# Patient Record
Sex: Female | Born: 1945 | Race: White | Hispanic: No | Marital: Married | Smoking: Former smoker
Health system: Southern US, Community
[De-identification: ages and names within clinical notes are randomized; demographics above are authoritative.]

## PROBLEM LIST (undated history)

## (undated) DIAGNOSIS — F329 Major depressive disorder, single episode, unspecified: Secondary | ICD-10-CM

## (undated) DIAGNOSIS — K219 Gastro-esophageal reflux disease without esophagitis: Secondary | ICD-10-CM

## (undated) DIAGNOSIS — F32A Depression, unspecified: Secondary | ICD-10-CM

## (undated) DIAGNOSIS — G43909 Migraine, unspecified, not intractable, without status migrainosus: Secondary | ICD-10-CM

## (undated) DIAGNOSIS — M359 Systemic involvement of connective tissue, unspecified: Secondary | ICD-10-CM

## (undated) DIAGNOSIS — K529 Noninfective gastroenteritis and colitis, unspecified: Secondary | ICD-10-CM

## (undated) DIAGNOSIS — M1990 Unspecified osteoarthritis, unspecified site: Secondary | ICD-10-CM

## (undated) DIAGNOSIS — F419 Anxiety disorder, unspecified: Secondary | ICD-10-CM

## (undated) DIAGNOSIS — IMO0002 Reserved for concepts with insufficient information to code with codable children: Secondary | ICD-10-CM

## (undated) DIAGNOSIS — G709 Myoneural disorder, unspecified: Secondary | ICD-10-CM

## (undated) DIAGNOSIS — T4145XA Adverse effect of unspecified anesthetic, initial encounter: Secondary | ICD-10-CM

## (undated) DIAGNOSIS — R011 Cardiac murmur, unspecified: Secondary | ICD-10-CM

## (undated) DIAGNOSIS — E7800 Pure hypercholesterolemia, unspecified: Secondary | ICD-10-CM

## (undated) DIAGNOSIS — C4491 Basal cell carcinoma of skin, unspecified: Secondary | ICD-10-CM

## (undated) DIAGNOSIS — K589 Irritable bowel syndrome without diarrhea: Secondary | ICD-10-CM

## (undated) DIAGNOSIS — Z9289 Personal history of other medical treatment: Secondary | ICD-10-CM

## (undated) DIAGNOSIS — I499 Cardiac arrhythmia, unspecified: Secondary | ICD-10-CM

## (undated) DIAGNOSIS — I10 Essential (primary) hypertension: Secondary | ICD-10-CM

## (undated) HISTORY — PX: BREAST CYST ASPIRATION: SHX578

## (undated) HISTORY — PX: SQUAMOUS CELL CARCINOMA EXCISION: SHX2433

## (undated) HISTORY — PX: DILATION AND CURETTAGE OF UTERUS: SHX78

## (undated) HISTORY — PX: NASAL SINUS SURGERY: SHX719

## (undated) HISTORY — PX: VARICOSE VEIN SURGERY: SHX832

## (undated) HISTORY — PX: TOTAL KNEE ARTHROPLASTY: SHX125

## (undated) HISTORY — PX: KNEE ARTHROSCOPY: SUR90

## (undated) HISTORY — PX: COLONOSCOPY: SHX174

## (undated) HISTORY — DX: Reserved for concepts with insufficient information to code with codable children: IMO0002

## (undated) HISTORY — PX: JOINT REPLACEMENT: SHX530

## (undated) HISTORY — PX: BREAST BIOPSY: SHX20

## (undated) HISTORY — PX: CATARACT EXTRACTION W/ INTRAOCULAR LENS  IMPLANT, BILATERAL: SHX1307

---

## 1968-11-21 DIAGNOSIS — Z9289 Personal history of other medical treatment: Secondary | ICD-10-CM

## 1968-11-21 HISTORY — DX: Personal history of other medical treatment: Z92.89

## 1987-11-22 DIAGNOSIS — T8859XA Other complications of anesthesia, initial encounter: Secondary | ICD-10-CM

## 1987-11-22 HISTORY — DX: Other complications of anesthesia, initial encounter: T88.59XA

## 1987-11-22 HISTORY — PX: CHOLECYSTECTOMY OPEN: SUR202

## 2011-03-25 ENCOUNTER — Encounter: Payer: Self-pay | Admitting: Family Medicine

## 2011-03-25 ENCOUNTER — Ambulatory Visit
Admission: RE | Admit: 2011-03-25 | Discharge: 2011-03-25 | Disposition: A | Discharge status: Home / Self Care | Source: Ambulatory Visit | Attending: Family Medicine | Admitting: Family Medicine

## 2011-03-25 ENCOUNTER — Ambulatory Visit (INDEPENDENT_AMBULATORY_CARE_PROVIDER_SITE_OTHER): Admitting: Family Medicine

## 2011-03-25 VITALS — BP 139/72 | HR 68 | Ht 65.5 in | Wt 224.0 lb

## 2011-03-25 DIAGNOSIS — N949 Unspecified condition associated with female genital organs and menstrual cycle: Secondary | ICD-10-CM

## 2011-03-25 DIAGNOSIS — N9489 Other specified conditions associated with female genital organs and menstrual cycle: Secondary | ICD-10-CM

## 2011-03-25 DIAGNOSIS — R32 Unspecified urinary incontinence: Secondary | ICD-10-CM

## 2011-03-25 DIAGNOSIS — M25559 Pain in unspecified hip: Secondary | ICD-10-CM

## 2011-03-25 DIAGNOSIS — M25551 Pain in right hip: Secondary | ICD-10-CM

## 2011-03-25 DIAGNOSIS — L259 Unspecified contact dermatitis, unspecified cause: Secondary | ICD-10-CM

## 2011-03-25 LAB — WET PREP FOR TRICH, YEAST, CLUE: Trich, Wet Prep: NONE SEEN

## 2011-03-25 LAB — POCT URINALYSIS DIPSTICK
Blood, UA: NEGATIVE
Glucose, UA: NEGATIVE

## 2011-03-25 MED ORDER — TRIAMCINOLONE ACETONIDE 0.5 % EX CREA: TOPICAL_CREAM | Freq: Three times a day (TID) | CUTANEOUS | Status: DC

## 2011-03-25 MED ORDER — ESTRADIOL 0.1 MG/GM VA CREA: TOPICAL_CREAM | VAGINAL | Status: DC

## 2011-03-25 NOTE — Assessment & Plan Note (Signed)
Rash certainly appears to be contact dermatitis, not sure what her reaction is to.  Will treat with triamcinolone cream tid x 10 days and avoid scratching.  If not improving, will have derm see her.  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

## 2011-03-25 NOTE — Progress Notes (Signed)
Subjective:    Patient ID: Katherine Nixon, female    DOB: October 23, 1946, 65 y.o.   MRN: [id number]  HPI65 yo WF presents for NOV.  She moved here from [place], [place] last August.  She has had R hip pain with some radiation down her R leg for months and it has kept her from exercising.  She has had an itchy rash on her forearms for 5 wks and one on her chest for 3 mos.  She was diagnosed with sarcoma of the L forearm about 2 yrs ago.    Her last physical was 08-2009 with labs and mammogram.  She had a normal colonoscopy 10-2006.    She c/o vaginal discharge with blood tinge, urinary leakage and painful intercourse.  She is 12 yrs postmenopausal, never on HRT.  She has had 2 negative breast biopsies and no personal hx of breast or ovarian cancer.  Denies dysuria.    BP 139/72  Pulse 68  Ht 5' 5.5" (1.664 m)  Wt 224 lb (101.606 kg)  BMI 36.71 kg/m2  SpO2 96%  History reviewed. No pertinent past medical history.  Past Surgical History  Procedure Date  . Nasal sinus surgery   . Laparoscopic cholecystectomy   . Breast surgery     biopsies/ aspirations - neg  . Dilation and curettage of uterus   . Varicose vein surgery     Family History  Problem Relation Age of Onset  . Heart disease Mother     History   Social History  . Marital Status: Married    Spouse Name: Jimmie    Number of Children: 4  . Years of Education: 15   Occupational History  . retired     med assist, Teacher, adult education   Social History Main Topics  . Smoking status: Former Smoker    Quit date: 11/22/1987  . Smokeless tobacco: Not on file  . Alcohol Use: No  . Drug Use: No  . Sexually Active: No   Other Topics Concern  . Not on file   Social History Narrative  . No narrative on file    Not on File  Current outpatient prescriptions:estradiol (ESTRACE) 0.1 MG/GM vaginal cream, 1/2 gram PV 2 days/ wk, Disp: 42.5 g, Rfl: 1;  traZODone (DESYREL) 50 MG tablet, Take 50 mg by mouth at bedtime.  , Disp: ,  Rfl: ;  triamcinolone (KENALOG) 0.5 % cream, Apply topically 3 (three) times daily., Disp: 15 g, Rfl: 1   Review of Systems  Constitutional: Negative for fever, fatigue and unexpected weight change.  HENT: Negative for congestion and sore throat.   Eyes: Negative for visual disturbance.  Respiratory: Negative for cough and shortness of breath.   Gastrointestinal: Negative for nausea, vomiting, abdominal pain and diarrhea.  Genitourinary: Positive for urgency, vaginal discharge, enuresis, vaginal pain and dyspareunia. Negative for dysuria, frequency, hematuria and pelvic pain.  Musculoskeletal: Positive for arthralgias (R hip pain, some LBP, some R leg pain).  Neurological: Negative for numbness and headaches.  Psychiatric/Behavioral: Positive for sleep disturbance. Negative for dysphoric mood. The patient is not nervous/anxious.        Objective:   Physical Exam  Constitutional: She appears well-developed and well-nourished. No distress.       obese  HENT:  Head: Normocephalic and atraumatic.  Eyes: Conjunctivae are normal. No scleral icterus.  Neck: Neck supple. No thyromegaly present.  Cardiovascular: Normal rate, regular rhythm and normal heart sounds.   No murmur heard. Pulmonary/Chest: Effort normal and breath  sounds normal. No respiratory distress. She has no wheezes.  Musculoskeletal: She exhibits no edema.       Gait normal  Lymphadenopathy:    She has no cervical adenopathy.  Skin: Rash (tiny vesiciles on erythematous base both forearms and chest) noted.       Spider veins, both legs  Psychiatric: She has a normal mood and affect.          Assessment & Plan:

## 2011-03-25 NOTE — Patient Instructions (Signed)
Use Triamcinolone cream on rash 3 x a day for 10 days. Call me if not resolved in 10 days.  Dermatologist in [place]:  Will call you with wet prep results on Monday.  Xray R hip today and will call you w/ results on Monday.  Consider Estrogen vaginal cream 1/ 2 gram 2 days/ wk for atrophic vaginitis.  Return for f/u visit with fasting labs in 2 mos.

## 2011-03-25 NOTE — Assessment & Plan Note (Addendum)
UA normal today.  Likely from obesity and atrophic vaginitis, also with blood tinged discharge.  Will r/o vaginitis also.  She agrees to a trial of Estrogen transvaginal cream 1/2 gram 2 x a wk.

## 2011-03-26 ENCOUNTER — Telehealth: Payer: Self-pay | Admitting: Family Medicine

## 2011-03-26 DIAGNOSIS — M1610 Unilateral primary osteoarthritis, unspecified hip: Secondary | ICD-10-CM

## 2011-03-26 MED ORDER — METRONIDAZOLE 0.75 % VA GEL: 1.0000 | Freq: Every day | VAGINAL | Status: AC

## 2011-03-26 NOTE — Telephone Encounter (Signed)
Pls let pt know that she has arthritis (moderate) on Xray.  If she wants to get in with ortho downstairs, I am happy to do this for her.

## 2011-03-26 NOTE — Telephone Encounter (Signed)
Pls let pt know that she did test + for bacterial vaginosis on her wet prep.  This is not an STD.  Will treat with Metronidazole intravaginally daily x 5 days.

## 2011-03-28 NOTE — Telephone Encounter (Signed)
Pt aware of the below and would like to see ortho.

## 2011-03-28 NOTE — Telephone Encounter (Signed)
Done

## 2011-04-21 ENCOUNTER — Other Ambulatory Visit: Payer: Self-pay | Admitting: Sports Medicine

## 2011-04-21 ENCOUNTER — Ambulatory Visit
Admission: RE | Admit: 2011-04-21 | Discharge: 2011-04-21 | Disposition: A | Discharge status: Home / Self Care | Source: Ambulatory Visit | Attending: Sports Medicine | Admitting: Sports Medicine

## 2011-04-21 DIAGNOSIS — M545 Low back pain: Secondary | ICD-10-CM

## 2011-04-28 ENCOUNTER — Ambulatory Visit (INDEPENDENT_AMBULATORY_CARE_PROVIDER_SITE_OTHER): Admitting: Family Medicine

## 2011-04-28 ENCOUNTER — Encounter: Payer: Self-pay | Admitting: Family Medicine

## 2011-04-28 VITALS — BP 170/85 | HR 96 | Ht 66.0 in | Wt 227.0 lb

## 2011-04-28 DIAGNOSIS — H43399 Other vitreous opacities, unspecified eye: Secondary | ICD-10-CM

## 2011-04-28 NOTE — Progress Notes (Signed)
  Subjective:    Patient ID: Katherine Nixon, female    DOB: [birth year], 65 y.o.   MRN: [id number]  HPI Floater in the left eye started last night.  Started with one but now having several others.  Never had this before. This started getting light flashes in the left side of her left eye. She wears glasses. She was on nabumetone for her leg and hip but stopped it Tuesday.  No eye pain. Low grade HA on the left. Notes her BP is higher today than usual.     Review of Systems     Objective:   Physical Exam  Constitutional: She appears well-developed and well-nourished.  HENT:  Head: Normocephalic and atraumatic.  Eyes: Conjunctivae and EOM are normal. Pupils are equal, round, and reactive to light. Right eye exhibits no discharge. Left eye exhibits no discharge.       Attempted fundoscopy exam but vessels not well visualized.           Assessment & Plan:  Eye floaters and flashes - Worriesome for retinal detachment. Prompt referral to Ophthalmology. They can fit her in tomorrow at 12:45. Pt given appt card and time and location before she left today. If sudden vision loss overnight then needs to go to the ER.   Elevated BP- may be from nabumetone or from her pain today or the anxiety about her eyes.

## 2011-05-09 ENCOUNTER — Encounter: Payer: Self-pay | Admitting: Family Medicine

## 2011-05-09 DIAGNOSIS — F329 Major depressive disorder, single episode, unspecified: Secondary | ICD-10-CM | POA: Insufficient documentation

## 2011-05-09 DIAGNOSIS — K219 Gastro-esophageal reflux disease without esophagitis: Secondary | ICD-10-CM | POA: Insufficient documentation

## 2011-05-09 DIAGNOSIS — F3289 Other specified depressive episodes: Secondary | ICD-10-CM | POA: Insufficient documentation

## 2011-05-09 DIAGNOSIS — I10 Essential (primary) hypertension: Secondary | ICD-10-CM | POA: Insufficient documentation

## 2011-05-09 DIAGNOSIS — I8390 Asymptomatic varicose veins of unspecified lower extremity: Secondary | ICD-10-CM | POA: Insufficient documentation

## 2011-05-23 ENCOUNTER — Encounter: Payer: Self-pay | Admitting: Family Medicine

## 2011-06-01 ENCOUNTER — Ambulatory Visit (INDEPENDENT_AMBULATORY_CARE_PROVIDER_SITE_OTHER): Admitting: Family Medicine

## 2011-06-01 ENCOUNTER — Encounter: Payer: Self-pay | Admitting: Family Medicine

## 2011-06-01 DIAGNOSIS — I10 Essential (primary) hypertension: Secondary | ICD-10-CM

## 2011-06-01 DIAGNOSIS — N760 Acute vaginitis: Secondary | ICD-10-CM

## 2011-06-01 DIAGNOSIS — L259 Unspecified contact dermatitis, unspecified cause: Secondary | ICD-10-CM

## 2011-06-01 DIAGNOSIS — Z13220 Encounter for screening for lipoid disorders: Secondary | ICD-10-CM

## 2011-06-01 DIAGNOSIS — N952 Postmenopausal atrophic vaginitis: Secondary | ICD-10-CM

## 2011-06-01 LAB — WET PREP FOR TRICH, YEAST, CLUE: Clue Cells Wet Prep HPF POC: NONE SEEN

## 2011-06-01 MED ORDER — AMLODIPINE BESYLATE 5 MG PO TABS: 5.0000 mg | ORAL_TABLET | Freq: Every day | ORAL | Status: DC

## 2011-06-01 MED ORDER — TRAZODONE HCL 50 MG PO TABS: 50.0000 mg | ORAL_TABLET | Freq: Every day | ORAL | Status: DC

## 2011-06-01 MED ORDER — ESTRADIOL 25 MCG VA TABS: 25.0000 ug | ORAL_TABLET | Freq: Every day | VAGINAL | Status: DC

## 2011-06-01 NOTE — Assessment & Plan Note (Signed)
Estrace cream did not help much and she still had dyspareunia.  Will change her to Vagifem 10 mg daily for the first 2 wks of each month (PV).  WP obtained today.

## 2011-06-01 NOTE — Patient Instructions (Signed)
Fasting labs today. Will call you w/ results tomorrow.  Start Norvasc 5 mg/ day for BP.  Use RX Vagifem tab intravaginally for the first 2 mos of each month.  F/u BP in 3 mos.  PAP due after DEC.

## 2011-06-01 NOTE — Progress Notes (Signed)
  Subjective:    Patient ID: Katherine Nixon, female    DOB: June 15, 1946, 65 y.o.   MRN: [id number]  HPI  65 yo WF presents for f/u visit with fasting labs.  She did see her eye doctor for this was diagnosed with a vitreous detachement and has f/u in a wk.  She had to go to [place] to get her mom and bring her down here.  Her mom has pretty bad COPD.   She has never had HTN though her Bp has been high her last 3 visits now.  Denies chest pain, DOe, swelling or use of decongestants.    BP 157/87  Pulse 62  Ht 5\' 6"  (1.676 m)  Wt 223 lb (101.152 kg)  BMI 35.99 kg/m2  SpO2 96%   Review of Systems  Constitutional: Negative for unexpected weight change.  Eyes: Negative for visual disturbance.  Respiratory: Negative for shortness of breath.   Cardiovascular: Negative for chest pain and palpitations.  Genitourinary: Negative for difficulty urinating.  Neurological: Negative for headaches.       Objective:   Physical Exam  Constitutional: She appears well-developed and well-nourished. No distress.       obese  HENT:  Mouth/Throat: Oropharynx is clear and moist.  Eyes: Conjunctivae are normal. No scleral icterus.  Neck: No thyromegaly present.  Cardiovascular: Normal rate, regular rhythm and normal heart sounds.   No murmur heard. Pulmonary/Chest: Effort normal and breath sounds normal.  Musculoskeletal: She exhibits no edema.  Lymphadenopathy:    She has no cervical adenopathy.  Skin: Skin is warm and dry.  Psychiatric: She has a normal mood and affect.          Assessment & Plan:

## 2011-06-01 NOTE — Assessment & Plan Note (Signed)
Will update her labs today and start her on Norvasc 5 mg/ day.  Call if any problems.  F/u in 3 mos.  Work on diet, exercise, wt loss.

## 2011-06-02 ENCOUNTER — Telehealth: Payer: Self-pay | Admitting: Family Medicine

## 2011-06-02 DIAGNOSIS — E785 Hyperlipidemia, unspecified: Secondary | ICD-10-CM

## 2011-06-02 LAB — COMPLETE METABOLIC PANEL WITH GFR
AST: 21 U/L (ref 0–37)
Albumin: 4.7 g/dL (ref 3.5–5.2)
Alkaline Phosphatase: 81 U/L (ref 39–117)
BUN: 13 mg/dL (ref 6–23)
GFR, Est Non African American: >60 mL/min (ref >60)
Potassium: 4.5 mEq/L (ref 3.5–5.3)
Sodium: 142 mEq/L (ref 135–145)
Total Bilirubin: 0.6 mg/dL (ref 0.3–1.2)

## 2011-06-02 LAB — CBC WITH DIFFERENTIAL/PLATELET
Basophils Absolute: 0 10*3/uL (ref 0.0–0.1)
Basophils Relative: 1 % (ref 0–1)
Eosinophils Absolute: 0.1 10*3/uL (ref 0.0–0.7)
MCH: 31.6 pg (ref 26.0–34.0)
MCHC: 32.4 g/dL (ref 30.0–36.0)
Monocytes Absolute: 0.5 10*3/uL (ref 0.1–1.0)
Neutro Abs: 3.3 10*3/uL (ref 1.7–7.7)
Neutrophils Relative %: 59 % (ref 43–77)
RDW: 13.7 % (ref 11.5–15.5)

## 2011-06-02 MED ORDER — PRAVASTATIN SODIUM 40 MG PO TABS: 40.0000 mg | ORAL_TABLET | Freq: Every day | ORAL | Status: DC

## 2011-06-02 MED ORDER — ESTRADIOL 10 MCG VA TABS: ORAL_TABLET | VAGINAL | Status: DC

## 2011-06-02 NOTE — Telephone Encounter (Signed)
Pls let pt know that her wet prep came back negative for vaginitis.  Blood counts are normal.  Fasting sugar, liver and kidney function are normal.  Cholesterol is HIGH with a total of 265 and an LDL bad cholesterol of 169. She should be <130.  Will add RX medication at bedtime to get her to goal and reduce her risk for heart attack and stroke.    Call if any questions/ problems and repeat LDL with liver enzymes after 8 wks.

## 2011-06-02 NOTE — Telephone Encounter (Signed)
Pt aware of the above  

## 2011-06-16 ENCOUNTER — Other Ambulatory Visit: Payer: Self-pay | Admitting: Sports Medicine

## 2011-06-16 DIAGNOSIS — M545 Low back pain, unspecified: Secondary | ICD-10-CM

## 2011-06-25 ENCOUNTER — Ambulatory Visit
Admission: RE | Admit: 2011-06-25 | Discharge: 2011-06-25 | Disposition: A | Discharge status: Home / Self Care | Source: Ambulatory Visit | Attending: Sports Medicine | Admitting: Sports Medicine

## 2011-06-25 DIAGNOSIS — M545 Low back pain, unspecified: Secondary | ICD-10-CM

## 2011-09-01 ENCOUNTER — Ambulatory Visit: Admitting: Family Medicine

## 2011-09-01 ENCOUNTER — Encounter: Payer: Self-pay | Admitting: Family Medicine

## 2011-09-01 ENCOUNTER — Ambulatory Visit (INDEPENDENT_AMBULATORY_CARE_PROVIDER_SITE_OTHER): Admitting: Family Medicine

## 2011-09-01 VITALS — BP 138/72 | HR 70 | Wt 224.0 lb

## 2011-09-01 DIAGNOSIS — E785 Hyperlipidemia, unspecified: Secondary | ICD-10-CM

## 2011-09-01 DIAGNOSIS — Z23 Encounter for immunization: Secondary | ICD-10-CM

## 2011-09-01 DIAGNOSIS — I10 Essential (primary) hypertension: Secondary | ICD-10-CM

## 2011-09-01 NOTE — Patient Instructions (Signed)
Low salt diet and as much exercise as possible Go for labs anytime in the next month.

## 2011-09-01 NOTE — Progress Notes (Signed)
  Subjective:    Patient ID: Katherine Nixon, female    DOB: 05/10/1946, 65 y.o.   MRN: [id number]  Hypertension This is a chronic problem. The current episode started more than 1 month ago. The problem is unchanged. Pertinent negatives include no chest pain or shortness of breath. There are no associated agents to hypertension. Past treatments include calcium channel blockers. Compliance problems include exercise.   her hip pain has really kept her from exercising. Also admits she is not eaint as well. Has been stress eating. Used to work out regularly until this last year.     Review of Systems  Respiratory: Negative for shortness of breath.   Cardiovascular: Negative for chest pain.       Objective:   Physical Exam  Constitutional: She is oriented to person, place, and time. She appears well-developed and well-nourished.  HENT:  Head: Normocephalic and atraumatic.  Cardiovascular: Normal rate, regular rhythm and normal heart sounds.   Pulmonary/Chest: Effort normal and breath sounds normal.  Musculoskeletal: She exhibits no edema.  Neurological: She is oriented to person, place, and time.  Skin: Skin is warm and dry.  Psychiatric: She has a normal mood and affect. Her behavior is normal.          Assessment & Plan:  HTN - Doing well on amlodipine. No side effects. F.U in 4 months.  I did encourage regular exercise and diet. She feels she already eats a low-salt diet.  Hyperlipidemia - due to recheck on her levels on pravastatin.  She admits she has not been taking a statin for quite some time. She says she doesn't quite believe the numbers that came back on her last cholesterol check in April. Thus I gave her a lab slip to go back this month to have them rechecked off of the medication. If they're still high then she will need to restart a statin. We discussed the benefits of reducing the morbidity and mortality mortality of heart attack and stroke if she treats her cholesterol.  Her last LDL was 169.

## 2011-12-22 ENCOUNTER — Other Ambulatory Visit: Payer: Self-pay | Admitting: Family Medicine

## 2011-12-22 DIAGNOSIS — Z85828 Personal history of other malignant neoplasm of skin: Secondary | ICD-10-CM | POA: Diagnosis not present

## 2012-04-19 DIAGNOSIS — L608 Other nail disorders: Secondary | ICD-10-CM | POA: Diagnosis not present

## 2012-05-03 DIAGNOSIS — R21 Rash and other nonspecific skin eruption: Secondary | ICD-10-CM | POA: Diagnosis not present

## 2012-05-11 ENCOUNTER — Telehealth: Payer: Self-pay | Admitting: Family Medicine

## 2012-05-11 NOTE — Telephone Encounter (Signed)
Call patient: I received some blood work from [facility] dermatology, Dr. Stefanie Libel. 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 At all her medications the only one that can cause this is pravastatin. If that time where she's not actually taking the medication. I don't believe she taken this for over a  Year,  in which case this would not be causing her symptoms. I just want to verify whether not she was still taking the pravastatin.

## 2012-05-11 NOTE — Telephone Encounter (Signed)
LM for pt to call back to ask questions per Dr. Linford Arnold.

## 2012-05-14 NOTE — Telephone Encounter (Signed)
Pt states she hasn't taken the Pravastatin in almost a year and that she only took it for a week. States the only meds she is on are the BP med and trazodone. States she has scheduled an appt with you on 05/22/12.

## 2012-05-22 ENCOUNTER — Ambulatory Visit (INDEPENDENT_AMBULATORY_CARE_PROVIDER_SITE_OTHER): Payer: Medicare Other | Admitting: Family Medicine

## 2012-05-22 ENCOUNTER — Encounter: Payer: Self-pay | Admitting: Family Medicine

## 2012-05-22 VITALS — BP 134/73 | HR 69 | Ht 66.0 in | Wt 222.0 lb

## 2012-05-22 DIAGNOSIS — Z1331 Encounter for screening for depression: Secondary | ICD-10-CM

## 2012-05-22 DIAGNOSIS — E785 Hyperlipidemia, unspecified: Secondary | ICD-10-CM | POA: Diagnosis not present

## 2012-05-22 DIAGNOSIS — R7989 Other specified abnormal findings of blood chemistry: Secondary | ICD-10-CM

## 2012-05-22 DIAGNOSIS — M359 Systemic involvement of connective tissue, unspecified: Secondary | ICD-10-CM

## 2012-05-22 DIAGNOSIS — Z8379 Family history of other diseases of the digestive system: Secondary | ICD-10-CM | POA: Diagnosis not present

## 2012-05-22 DIAGNOSIS — R946 Abnormal results of thyroid function studies: Secondary | ICD-10-CM | POA: Diagnosis not present

## 2012-05-22 DIAGNOSIS — I10 Essential (primary) hypertension: Secondary | ICD-10-CM | POA: Diagnosis not present

## 2012-05-22 DIAGNOSIS — R21 Rash and other nonspecific skin eruption: Secondary | ICD-10-CM

## 2012-05-22 LAB — LIPID PANEL
Cholesterol: 235 mg/dL — ABNORMAL HIGH (ref 0–200)
VLDL: 16 mg/dL (ref 0–40)

## 2012-05-22 MED ORDER — AMLODIPINE BESYLATE 5 MG PO TABS: 5.0000 mg | ORAL_TABLET | Freq: Every day | ORAL | Status: DC

## 2012-05-22 MED ORDER — TRAZODONE HCL 50 MG PO TABS: 50.0000 mg | ORAL_TABLET | Freq: Every day | ORAL | Status: DC

## 2012-05-22 NOTE — Patient Instructions (Addendum)
We will call you with your lab results. If you don't here from us in about a week then please give us a call at [phone].  

## 2012-05-22 NOTE — Progress Notes (Addendum)
Subjective:    Patient ID: Katherine Nixon, female    DOB: 06-Dec-1945, 66 y.o.   MRN: [id number]  HPI Says keeps getting itchy rashes on her upper chest and back with no triggers.  Has inc joint pain. Nails have been coming off. Has seen ortho for her right hip and they rec hip replacement.  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 Says has had similar sxs previsously as well.  Has had hairloss.  Doesn't sleep well so uses trazodone. Gets about 4-5 hours per night on the med.  Gets hot sweats now but never had them when went through menupause.  The will get cold when she is tired.  She felt she had unexplained fatigue. She was also taking care of her ailing mother who has now passed away. Says has been depressed before but feels this is different.     Mother died of cirrhosis, fatty liver.  She would like to be checked. She does have swelling in feet and ankles.  The cirrhosis and fatty liver contribute to her mother's death and she is very concerned like to be evaluated for this. She is obese so it is highly likely she does have fatty liver.  Review of Systems BP 134/73  Pulse 69  Ht 5\' 6"  (1.676 m)  Wt 222 lb (100.699 kg)  BMI 35.83 kg/m2    Allergies  Allergen Reactions  . Bactrim Rash    Past Medical History  Diagnosis Date  . Squamous cell carcinoma     Past Surgical History  Procedure Date  . Nasal sinus surgery   . Laparoscopic cholecystectomy   . Breast surgery     biopsies/ aspirations - neg  . Dilation and curettage of uterus   . Varicose vein surgery   . Cholecystectomy     History   Social History  . Marital Status: Married    Spouse Name: Jimmie    Number of Children: 4  . Years of Education: 15   Occupational History  . retired     med assist, Teacher, adult education   Social History Main Topics  . Smoking status: Former Smoker    Quit date: 11/22/1987  . Smokeless tobacco: Not on  file  . Alcohol Use: No  . Drug Use: No  . Sexually Active: No   Other Topics Concern  . Not on file   Social History Narrative  . No narrative on file    Family History  Problem Relation Age of Onset  . Heart disease Mother     Outpatient Encounter Prescriptions as of 05/22/2012  Medication Sig Dispense Refill  . amLODipine (NORVASC) 5 MG tablet Take 1 tablet (5 mg total) by mouth daily.  30 tablet  5  . traZODone (DESYREL) 50 MG tablet Take 1 tablet (50 mg total) by mouth at bedtime.  30 tablet  6  . DISCONTD: amLODipine (NORVASC) 5 MG tablet TAKE 1 TABLET (5 MG TOTAL) BY MOUTH DAILY.  30 tablet  5  . DISCONTD: traZODone (DESYREL) 50 MG tablet Take 1 tablet (50 mg total) by mouth at bedtime.  30 tablet  6  . DISCONTD: Estradiol (VAGIFEM) 10 MCG TABS 1 tab PV daily for the first 2 wks of each month  8 tablet  3  . DISCONTD: pravastatin (PRAVACHOL) 40 MG tablet Take 1 tablet (40 mg total) by mouth daily.  30 tablet  2  Objective:   Physical Exam  Constitutional: She is oriented to person, place, and time. She appears well-developed and well-nourished.  HENT:  Head: Normocephalic and atraumatic.  Neck: Neck supple. No thyromegaly present.  Cardiovascular: Normal rate, regular rhythm and normal heart sounds.   Pulmonary/Chest: Effort normal and breath sounds normal.  Abdominal: Soft. Bowel sounds are normal. She exhibits no distension and no mass. There is no tenderness. There is no rebound and no guarding.  Musculoskeletal: She exhibits no edema.  Lymphadenopathy:    She has no cervical adenopathy.  Neurological: She is alert and oriented to person, place, and time.  Skin: Skin is warm and dry.       Her fingernails appear thin. The cuticles are also grown over the edge of the nail. At the base of the nail with a swollen erythematous with telangiectasias.  Psychiatric: She has a normal mood and affect. Her behavior is normal.          Assessment & Plan:    Autoimmune d/o - Recommend refer to rheumatology for further w/u. She is not on any other meds that should be causing drug induced lupus. I did review the labs but unfortunately they have not been scanned into the computer system today so not able to pull them up during the office visit.  Abnormal thyroid-we will recheck a TSH as well as T4 and T3. Certainly if her thyroid is off this could be causing some of her hair loss, skin rash and fatigue. If she has subclinical hyperthyroidism consider treatment.  Family hx of fatty liver and cirrhosis.- will check liver enzymes and schedule for [place].  We did discuss the main treatment for fatty liver is weight loss and regular exercise and low-fat diet.  Depression Screen - PHQ-9 score of 6 but answered neg to first 2 questions, thus neg for depression.

## 2012-05-23 ENCOUNTER — Other Ambulatory Visit: Payer: Self-pay | Admitting: Family Medicine

## 2012-05-23 ENCOUNTER — Encounter: Payer: Self-pay | Admitting: Family Medicine

## 2012-05-23 DIAGNOSIS — E785 Hyperlipidemia, unspecified: Secondary | ICD-10-CM | POA: Insufficient documentation

## 2012-05-23 LAB — COMPLETE METABOLIC PANEL WITH GFR
AST: 19 U/L (ref 0–37)
Albumin: 4.7 g/dL (ref 3.5–5.2)
BUN: 14 mg/dL (ref 6–23)
Calcium: 10.4 mg/dL (ref 8.4–10.5)
Chloride: 106 mEq/L (ref 96–112)
Glucose, Bld: 96 mg/dL (ref 70–99)
Potassium: 4.7 mEq/L (ref 3.5–5.3)

## 2012-05-23 MED ORDER — PRAVASTATIN SODIUM 40 MG PO TABS: 40.0000 mg | ORAL_TABLET | Freq: Every day | ORAL | Status: DC

## 2012-05-31 ENCOUNTER — Ambulatory Visit (INDEPENDENT_AMBULATORY_CARE_PROVIDER_SITE_OTHER): Payer: Medicare Other

## 2012-05-31 DIAGNOSIS — Z8379 Family history of other diseases of the digestive system: Secondary | ICD-10-CM | POA: Diagnosis not present

## 2012-05-31 DIAGNOSIS — N281 Cyst of kidney, acquired: Secondary | ICD-10-CM

## 2012-06-12 DIAGNOSIS — L608 Other nail disorders: Secondary | ICD-10-CM | POA: Diagnosis not present

## 2012-06-13 DIAGNOSIS — Z79899 Other long term (current) drug therapy: Secondary | ICD-10-CM | POA: Diagnosis not present

## 2012-06-13 DIAGNOSIS — M1990 Unspecified osteoarthritis, unspecified site: Secondary | ICD-10-CM | POA: Diagnosis not present

## 2012-06-22 ENCOUNTER — Encounter: Payer: Self-pay | Admitting: Family Medicine

## 2012-06-22 ENCOUNTER — Ambulatory Visit (INDEPENDENT_AMBULATORY_CARE_PROVIDER_SITE_OTHER): Payer: Medicare Other | Admitting: Family Medicine

## 2012-06-22 ENCOUNTER — Telehealth: Payer: Self-pay | Admitting: Family Medicine

## 2012-06-22 VITALS — BP 139/67 | HR 59 | Ht 66.0 in | Wt 218.0 lb

## 2012-06-22 DIAGNOSIS — N951 Menopausal and female climacteric states: Secondary | ICD-10-CM | POA: Diagnosis not present

## 2012-06-22 DIAGNOSIS — R232 Flushing: Secondary | ICD-10-CM

## 2012-06-22 DIAGNOSIS — E785 Hyperlipidemia, unspecified: Secondary | ICD-10-CM | POA: Diagnosis not present

## 2012-06-22 DIAGNOSIS — Z23 Encounter for immunization: Secondary | ICD-10-CM | POA: Diagnosis not present

## 2012-06-22 DIAGNOSIS — Z9181 History of falling: Secondary | ICD-10-CM

## 2012-06-22 MED ORDER — AMBULATORY NON FORMULARY MEDICATION: Status: DC

## 2012-06-22 NOTE — Progress Notes (Signed)
  Subjective:    Patient ID: Katherine Nixon, female    DOB: 1946/03/15, 66 y.o.   MRN: [id number]  HPI Hyperlipidemia - We checked her labs and her chol was better by about 30 points but still elevated.  She has several concerns including worsening joint adn muscle pain which she is already experiencing, and liver damage  Still having sig hip pain that is keeping her from exercising. Says will need hip replacement.  But has changed her diet to try to lose weight. They recommended that she loosen significant weight before having the surgery so she will have the best potential outcome. Unfortunately she hasn't been noted exercise regularly since she's been mainly focusing on her diet. She's lost about 4 pounds and she was last here.   Hot flashes at night are miserble. Had had for years. She is menopausal  as they often wake her up at night. She thinks she did try something over-the-counter such as black cohosh but has not 100% sure.She wants to know if this could be related to her joint and muscle pain. She had normal blood work in June including a normal thyroid level.   Review of Systems     Objective:   Physical Exam  Constitutional: She appears well-developed and well-nourished.  HENT:  Head: Normocephalic and atraumatic.  Psychiatric: She has a normal mood and affect. Her behavior is normal.          Assessment & Plan:  Hyperlipidemia - I discussed her concerns.  I explained her that we will follow her liver enzymes to make sure that the medication is not affecting it. In addition certainly if she feels like her joint and muscle pain is worsened by the statin we can stop it and see if it improves and then consider switching to a different statin if needed. Since she has had significant improvement in her numbers, she would like to continue to work on diet exercise and weight loss and see she can get down on her own. I think this is very reasonable. Though I explained that I want to wait  another year to recheck her levels. I think for 6 months would be much more appropriate. Patient agrees to care plan. Recheck in 4-6 months.   Due for pneumonia vaccine. Given today  Given rx for shingles vaccine - Wait one month after the pneumonia vaccine.    Hot flahses - he certainly could be postmenopausal, consider testing for estradiol and progesterone as well as FSH and LH to make sure that arrival seem appropriate for a postmenopausal woman. Her thyroid was normal. She is obese which certainly can contribute and I did encourage weight loss.  Hip pain - We will be happy to work with her on weight loss to that she can have her hip replacement surgery and hopefully be able to become more active.  Fall assessment score of 4, low risk for falls.  25 minutes spent face-to-face with over half the time spent in discussion and counseling about her hot flashes and her need for treatment for cholesterol.

## 2012-06-22 NOTE — Telephone Encounter (Signed)
Please call her and see if she would like to check her hormones. I know while she was here one of her major complaint was hot flashes. She would like we can certainly check to make sure that they do confirm that she is postmenopausal and that is something doesn't seem out of the norm.

## 2012-06-22 NOTE — Patient Instructions (Addendum)
Cna go for shingles vaccine in one month

## 2012-06-26 NOTE — Telephone Encounter (Signed)
Left message on vm with advise 

## 2012-07-06 DIAGNOSIS — M1990 Unspecified osteoarthritis, unspecified site: Secondary | ICD-10-CM | POA: Diagnosis not present

## 2012-09-05 DIAGNOSIS — M329 Systemic lupus erythematosus, unspecified: Secondary | ICD-10-CM | POA: Diagnosis not present

## 2012-09-12 DIAGNOSIS — H01009 Unspecified blepharitis unspecified eye, unspecified eyelid: Secondary | ICD-10-CM | POA: Diagnosis not present

## 2012-09-12 DIAGNOSIS — H2589 Other age-related cataract: Secondary | ICD-10-CM | POA: Diagnosis not present

## 2012-09-12 DIAGNOSIS — H526 Other disorders of refraction: Secondary | ICD-10-CM | POA: Diagnosis not present

## 2012-09-24 ENCOUNTER — Other Ambulatory Visit: Payer: Self-pay | Admitting: Family Medicine

## 2012-09-26 DIAGNOSIS — M329 Systemic lupus erythematosus, unspecified: Secondary | ICD-10-CM | POA: Diagnosis not present

## 2012-11-05 DIAGNOSIS — M329 Systemic lupus erythematosus, unspecified: Secondary | ICD-10-CM | POA: Diagnosis not present

## 2012-11-20 ENCOUNTER — Other Ambulatory Visit: Payer: Self-pay | Admitting: Family Medicine

## 2012-11-26 ENCOUNTER — Ambulatory Visit (INDEPENDENT_AMBULATORY_CARE_PROVIDER_SITE_OTHER): Payer: Medicare Other | Admitting: Family Medicine

## 2012-11-26 ENCOUNTER — Other Ambulatory Visit (HOSPITAL_COMMUNITY)
Admission: RE | Admit: 2012-11-26 | Discharge: 2012-11-26 | Disposition: A | Discharge status: Home / Self Care | Payer: Medicare Other | Source: Ambulatory Visit | Attending: Family Medicine | Admitting: Family Medicine

## 2012-11-26 ENCOUNTER — Encounter: Payer: Self-pay | Admitting: Family Medicine

## 2012-11-26 VITALS — BP 139/81 | HR 82 | Resp 18 | Ht 64.5 in | Wt 215.0 lb

## 2012-11-26 DIAGNOSIS — Z124 Encounter for screening for malignant neoplasm of cervix: Secondary | ICD-10-CM | POA: Insufficient documentation

## 2012-11-26 DIAGNOSIS — M79673 Pain in unspecified foot: Secondary | ICD-10-CM

## 2012-11-26 DIAGNOSIS — Z1151 Encounter for screening for human papillomavirus (HPV): Secondary | ICD-10-CM | POA: Insufficient documentation

## 2012-11-26 DIAGNOSIS — E785 Hyperlipidemia, unspecified: Secondary | ICD-10-CM

## 2012-11-26 DIAGNOSIS — I10 Essential (primary) hypertension: Secondary | ICD-10-CM | POA: Diagnosis not present

## 2012-11-26 DIAGNOSIS — I8390 Asymptomatic varicose veins of unspecified lower extremity: Secondary | ICD-10-CM

## 2012-11-26 DIAGNOSIS — Z0000 Encounter for general adult medical examination without abnormal findings: Secondary | ICD-10-CM

## 2012-11-26 DIAGNOSIS — Z1231 Encounter for screening mammogram for malignant neoplasm of breast: Secondary | ICD-10-CM

## 2012-11-26 LAB — COMPLETE METABOLIC PANEL WITH GFR
Albumin: 4.7 g/dL (ref 3.5–5.2)
Alkaline Phosphatase: 73 U/L (ref 39–117)
BUN: 17 mg/dL (ref 6–23)
Calcium: 10.8 mg/dL — ABNORMAL HIGH (ref 8.4–10.5)
Chloride: 103 mEq/L (ref 96–112)
GFR, Est Non African American: 67 mL/min
Glucose, Bld: 89 mg/dL (ref 70–99)
Potassium: 4.1 mEq/L (ref 3.5–5.3)

## 2012-11-26 LAB — LIPID PANEL: Cholesterol: 226 mg/dL — ABNORMAL HIGH (ref 0–200)

## 2012-11-26 NOTE — Patient Instructions (Signed)
Keep up a regular exercise program and make sure you are eating a healthy diet Try to eat 4 servings of dairy a day, or if you are lactose intolerant take a calcium with vitamin D daily.  Your vaccines are up to date.   

## 2012-11-26 NOTE — Progress Notes (Signed)
Subjective:    Katherine Nixon is a 67 y.o. female who presents for Medicare Initial preventive examination.  Preventive Screening-Counseling & Management  Tobacco History  Smoking status  . Former Smoker  . Quit date: 11/22/1987  Smokeless tobacco  . Not on file     Problems Prior to Visit 1.   Current Problems (verified) Patient Active Problem List  Diagnosis  . Urinary incontinence  . Essential hypertension, benign  . GERD (gastroesophageal reflux disease)  . Varicose veins  . Atrophic vaginitis  . Hyperlipemia    Medications Prior to Visit Current Outpatient Prescriptions on File Prior to Visit  Medication Sig Dispense Refill  . AMBULATORY NON FORMULARY MEDICATION Medication Name: Zostavax IM x 1  1 vial  0  . amLODipine (NORVASC) 5 MG tablet Take 1 tablet (5 mg total) by mouth daily.  30 tablet  5  . pravastatin (PRAVACHOL) 40 MG tablet TAKE 1 TABLET AT BEDTIME  30 tablet  1  . traZODone (DESYREL) 50 MG tablet Take 1 tablet (50 mg total) by mouth at bedtime.  30 tablet  6    Current Medications (verified) Current Outpatient Prescriptions  Medication Sig Dispense Refill  . AMBULATORY NON FORMULARY MEDICATION Medication Name: Zostavax IM x 1  1 vial  0  . amLODipine (NORVASC) 5 MG tablet Take 1 tablet (5 mg total) by mouth daily.  30 tablet  5  . pravastatin (PRAVACHOL) 40 MG tablet TAKE 1 TABLET AT BEDTIME  30 tablet  1  . traZODone (DESYREL) 50 MG tablet Take 1 tablet (50 mg total) by mouth at bedtime.  30 tablet  6     Allergies (verified) Bactrim   PAST HISTORY  Family History Family History  Problem Relation Age of Onset  . Heart disease Mother     Social History History  Substance Use Topics  . Smoking status: Former Smoker    Quit date: 11/22/1987  . Smokeless tobacco: Not on file  . Alcohol Use: No     Are there smokers in your home (other than you)? No  Risk Factors Current exercise habits: no regular exercise right now  Dietary  issues discussed: now on Paleo diet   Cardiac risk factors: advanced age (older than 96 for men, 45 for women), obesity (BMI >= 30 kg/m2) and sedentary lifestyle.  Depression Screen (Note: if answer to either of the following is "Yes", a more complete depression screening is indicated)   Over the past 2 weeks, have you felt down, depressed or hopeless? No  Over the past 2 weeks, have you felt little interest or pleasure in doing things? No  Have you lost interest or pleasure in daily life? No  Do you often feel hopeless? No  Do you cry easily over simple problems? No  Activities of Daily Living In your present state of health, do you have any difficulty performing the following activities?:  Driving? No Managing money?  No Feeding yourself? No Getting from bed to chair? No Climbing a flight of stairs? No Preparing food and eating?: No Bathing or showering? No Getting dressed: No Getting to the toilet? No Using the toilet:No Moving around from place to place: No In the past year have you fallen or had a near fall?:No   Are you sexually active?  No  Do you have more than one partner?  No  Hearing Difficulties: No Do you often ask people to speak up or repeat themselves? No Do you experience ringing or noises in  your ears? No Do you have difficulty understanding soft or whispered voices? No   Do you feel that you have a problem with memory? Yes  Do you often misplace items? Yes  Do you feel safe at home?  Yes  Cognitive Testing  Alert? Yes  Normal Appearance?Yes  Oriented to person? Yes  Place? Yes   Time? Yes  Recall of three objects?  Yes  Can perform simple calculations? Yes  Displays appropriate judgment?Yes  Can read the correct time from a watch face?Yes   Advanced Directives have been discussed with the patient? Yes  List the Names of Other Physician/Practitioners you currently use: 1.  Dr. Virgel Manifold for rheumatology  Indicate any recent Medical Services you may  have received from other than [facility] providers in the past year (date may be approximate).  Immunization History  Administered Date(s) Administered  . Influenza Split 09/01/2011  . Pneumococcal Polysaccharide 06/22/2012    Screening Tests Health Maintenance  Topic Date Due  . Mammogram  04/28/2013  . Colonoscopy  11/29/2015  . Tetanus/tdap  11/21/2016  . Influenza Vaccine  07/22/2012  . Pneumococcal Polysaccharide Vaccine Age 3 And Over  Completed  . Zostavax  Addressed    All answers were reviewed with the patient and necessary referrals were made:  METHENEY,CATHERINE, MD   11/26/2012   History reviewed: allergies, current medications, past family history, past medical history, past social history, past surgical history and problem list  Review of Systems A comprehensive review of systems was negative.    Objective:     Vision by Snellen chart: right eye:20/40, left eye:20/30  Body mass index is 36.33 kg/(m^2). BP 139/81  Pulse 82  Resp 18  Ht 5' 4.5" (1.638 m)  Wt 215 lb (97.523 kg)  BMI 36.33 kg/m2  SpO2 96%  BP 139/81  Pulse 82  Resp 18  Ht 5' 4.5" (1.638 m)  Wt 215 lb (97.523 kg)  BMI 36.33 kg/m2  SpO2 96%  General Appearance:    Alert, cooperative, no distress, appears stated age  Head:    Normocephalic, without obvious abnormality, atraumatic  Eyes:    PERRL, conjunctiva/corneas clear, EOM's intact, both eyes  Ears:    Normal TM's and external ear canals, both ears  Nose:   Nares normal, septum midline, mucosa normal, no drainage    or sinus tenderness  Throat:   Lips, mucosa, and tongue normal; teeth and gums normal  Neck:   Supple, symmetrical, trachea midline, no adenopathy;    thyroid:  no enlargement/tenderness/nodules; no carotid   bruit or JVD  Back:     Symmetric, no curvature, ROM normal, no CVA tenderness  Lungs:     Clear to auscultation bilaterally, respirations unlabored  Chest Wall:    No tenderness or deformity   Heart:    Regular rate and  rhythm, S1 and S2 normal, no murmur, rub   or gallop  Breast Exam:    No tenderness, masses, or nipple abnormality  Abdomen:     Soft, non-tender, bowel sounds active all four quadrants,    no masses, no organomegaly  Genitalia:    Normal female without lesion, discharge or tenderness. She does have some bladder prolapse  Rectal:    Not performed.   Extremities:   Extremities normal, atraumatic, no cyanosis or edema  Pulses:   2+ and symmetric all extremities  Skin:   Skin color, texture, turgor normal, no rashes or lesions  Lymph nodes:   Cervical, supraclavicular, and axillary  nodes normal  Neurologic:   CNII-XII intact, normal strength, sensation and reflexes    throughout       Assessment:     Annual Wellness Exam      Plan:     During the course of the visit the patient was educated and counseled about appropriate screening and preventive services including:    Influenza vaccine - she declined. Wants to speak to her rheumatologist about this. She read some information saying that because she has an autoimmune disorder, lupus that she shouldn't get.  Her that there is to have any problems our highest risk of having palpitations on the plate and in fact for this patient to should absolutely get the flu vaccine.  Will schedule mammogram.  Pap smear performed. We will call her with results when available.  Screening labwork. Lab slip given for her to today.  Diet review for nutrition referral? Yes ____  Not Indicated _x__  Patient Instructions (the written plan) was given to the patient.  Medicare Attestation I have personally reviewed: The patient's medical and social history Their use of alcohol, tobacco or illicit drugs Their current medications and supplements The patient's functional ability including ADLs,fall risks, home safety risks, cognitive, and hearing and visual impairment Diet and physical activities Evidence for depression or mood disorders  The  patient's weight, height, BMI, and visual acuity have been recorded in the chart.  I have made referrals, counseling, and provided education to the patient based on review of the above and I have provided the patient with a written personalized care plan for preventive services.     METHENEY,CATHERINE, MD   11/26/2012

## 2012-11-27 DIAGNOSIS — M329 Systemic lupus erythematosus, unspecified: Secondary | ICD-10-CM | POA: Diagnosis not present

## 2012-11-30 ENCOUNTER — Telehealth: Payer: Self-pay | Admitting: *Deleted

## 2012-11-30 NOTE — Telephone Encounter (Signed)
Message copied by Edilia Bo on Fri Nov 30, 2012 12:05 PM ------      Message from: Nani Gasser D      Created: Thu Nov 29, 2012  9:03 PM       Call pt: Pap is normal. Repeat in 3 years.

## 2012-11-30 NOTE — ED Notes (Signed)
Patient informed of normal PAP results. Repeat in 3 years. Verbalized understanding.

## 2012-12-04 ENCOUNTER — Other Ambulatory Visit: Payer: Self-pay | Admitting: *Deleted

## 2012-12-04 DIAGNOSIS — I83893 Varicose veins of bilateral lower extremities with other complications: Secondary | ICD-10-CM

## 2012-12-13 ENCOUNTER — Encounter: Payer: Self-pay | Admitting: Vascular Surgery

## 2012-12-14 ENCOUNTER — Ambulatory Visit (INDEPENDENT_AMBULATORY_CARE_PROVIDER_SITE_OTHER): Payer: Medicare Other | Admitting: Vascular Surgery

## 2012-12-14 ENCOUNTER — Encounter (INDEPENDENT_AMBULATORY_CARE_PROVIDER_SITE_OTHER): Payer: Medicare Other | Admitting: *Deleted

## 2012-12-14 ENCOUNTER — Encounter: Payer: Self-pay | Admitting: Vascular Surgery

## 2012-12-14 VITALS — BP 157/65 | HR 71 | Ht 64.5 in | Wt 218.2 lb

## 2012-12-14 DIAGNOSIS — I872 Venous insufficiency (chronic) (peripheral): Secondary | ICD-10-CM | POA: Diagnosis not present

## 2012-12-14 DIAGNOSIS — I83893 Varicose veins of bilateral lower extremities with other complications: Secondary | ICD-10-CM

## 2012-12-14 NOTE — Progress Notes (Signed)
[facility]  Referred by:  Agapito Games, MD [phone] [place], [place] [address]  Reason for referral: Swollen left leg  History of Present Illness  Katherine Nixon is a 67 y.o. (12/24/1945) female with history of multiple bilateral stab phlebectomies who presents with chief complaint: L swollen leg.  Patient notes, onset of swelling years ago with large varicosities.  The patient not certain if she had venous reflux in her saphenous veins at that time.  She believes she had ambulatory stab phlebectomies completed in both legs.  This resulted in resolution of her prior sx of swelling and bursting sensation.   Over the last few months, she has notice recurrence of he sx and bulging in her left >> right leg. The patient has had no history of DVT, known history of varicose vein, no history of venous stasis ulcers, no history of  Lymphedema and known history of skin changes in lower legs.  There is no family history of venous disorders.  The patient has used compression stockings in the past.  Past Medical History  Diagnosis Date  . Squamous cell carcinoma     Past Surgical History  Procedure Date  . Nasal sinus surgery   . Laparoscopic cholecystectomy   . Breast surgery     biopsies/ aspirations - neg  . Dilation and curettage of uterus   . Varicose vein surgery   . Cholecystectomy     History   Social History  . Marital Status: Married    Spouse Name: Jimmie    Number of Children: 4  . Years of Education: 15   Occupational History  . retired     med assist, Teacher, adult education   Social History Main Topics  . Smoking status: Former Smoker    Types: Cigarettes    Quit date: 11/22/1987  . Smokeless tobacco: Never Used  . Alcohol Use: No  . Drug Use: No  . Sexually Active: No   Other Topics Concern  . Not on file   Social History Narrative  . No narrative on file    Family History  Problem Relation Age of Onset    . Heart disease Mother   . Heart attack Mother   . Hyperlipidemia Father   . Other Father     amputaiton  . Hyperlipidemia Brother   . Hypertension Brother     Current Outpatient Prescriptions on File Prior to Visit  Medication Sig Dispense Refill  . amLODipine (NORVASC) 5 MG tablet Take 1 tablet (5 mg total) by mouth daily.  30 tablet  5  . pravastatin (PRAVACHOL) 40 MG tablet TAKE 1 TABLET AT BEDTIME  30 tablet  1  . traZODone (DESYREL) 50 MG tablet Take 1 tablet (50 mg total) by mouth at bedtime.  30 tablet  6  . AMBULATORY NON FORMULARY MEDICATION Medication Name: Zostavax IM x 1  1 vial  0    Allergies  Allergen Reactions  . Bactrim Rash    REVIEW OF SYSTEMS:  (Positives checked otherwise negative)  CARDIOVASCULAR:  [ ]  chest pain, [ ]  chest pressure, [ ]  palpitations, [ ]  shortness of breath when laying flat, [ ]  shortness of breath with exertion,   [x]  pain in feet when walking, [ ]  pain in feet when laying flat, [ ]  history of blood clot in veins (DVT), [ ]  history of phlebitis, [x]  swelling in legs, [x]  varicose veins  PULMONARY:  [ ]  productive cough, [ ]   asthma, [ ]  wheezing  NEUROLOGIC:  [ ]  weakness in arms or legs, [ ]  numbness in arms or legs, [ ]  difficulty speaking or slurred speech, [ ]  temporary loss of vision in one eye, [ ]  dizziness  HEMATOLOGIC:  [ ]  bleeding problems, [ ]  problems with blood clotting too easily  MUSCULOSKEL:  [ ]  joint pain, [ ]  joint swelling  GASTROINTEST:  [ ]   Vomiting blood, [ ]   Blood in stool     GENITOURINARY:  [ ]   Burning with urination, [ ]   Blood in urine  PSYCHIATRIC:  [ ]  history of major depression  INTEGUMENTARY:  [ ]  rashes, [ ]  ulcers  CONSTITUTIONAL:  [ ]  fever, [ ]  chills  Physical Examination  Filed Vitals:   12/14/12 1456  BP: 157/65  Pulse: 71  Height: 5' 4.5" (1.638 m)  Weight: 218 lb 3.2 oz (98.975 kg)  SpO2: 100%   Body mass index is 36.88 kg/(m^2).  General: A&O x 3, WDWN  Head:  Day/AT  Ear/Nose/Throat: Hearing grossly intact, nares w/o erythema or drainage, oropharynx w/o Erythema/Exudate  Eyes: PERRLA, EOMI  Neck: Supple, no nuchal rigidity, no palpable LAD  Pulmonary: Sym exp, good air movt, CTAB, no rales, rhonchi, & wheezing  Cardiac: RRR, Nl S1, S2, no Murmurs, rubs or gallops  Vascular: Vessel Right Left  Radial Palpable Palpable  Ulnar Palpable Palpable  Brachial Palpable Palpable  Carotid Palpable, without bruit Palpable, without bruit  Aorta Not palpable N/A  Femoral Palpable Palpable  Popliteal Not palpable Not palpable  PT Palpable Palpable  DP Palpable Palpable   Gastrointestinal: soft, NTND, -G/R, - HSM, - masses, - CVAT B  Musculoskeletal: M/S 5/5 throughout , Extremities without ischemic changes , BLE spider veins, varicosities palpable in medial calf, mild amount of LDS  Neurologic: CN 2-12 intact , Pain and light touch intact in extremities , Motor exam as listed above  Psychiatric: Judgment intact, Mood & affect appropriate for pt's clinical situation  Dermatologic: See M/S exam for extremity exam, no rashes otherwise noted  Lymph : No Cervical, Axillary, or Inguinal lymphadenopathy   Non-Invasive Vascular Imaging  BLE Venous Insufficiency Duplex (Date: 12/15/11):   RLE: no SVT or DVT, no superficial reflux, +deep reflux, - SSV reflux  LLE: no SVT or DVT, + GSV with perforator reflux noted, +deep reflux, - SSV reflux  Outside Studies/Documentation 3 pages of outside documents were reviewed including: outpatient chart including prior photograph which documents lateral thigh phlebectomies.  Medical Decision Making  Katherine Nixon is a 67 y.o. female who presents with: LLE CVI (C4), RLE spider vein, s/p BLE stab phlebectomy   Based on the patient's history and examination, I recommend: evaluation in Vein Clinic for EVLA of L GSV.  It is not clear to me if she had a stripping of the L GSV, as the photograph doesn't document  such.  Obtaining the records from the procedure could help clarify what was done.  Meanwhile, I recommend she continue in her thigh high compression stockings 20-30 mm Hg.  Thank you for allowing [place] to participate in this patient's care.  Leonides Sake, MD [facility] Office: [phone] Pager: [phone]  12/14/2012, 3:40 PM

## 2012-12-16 ENCOUNTER — Other Ambulatory Visit: Payer: Self-pay | Admitting: Family Medicine

## 2012-12-18 ENCOUNTER — Ambulatory Visit (INDEPENDENT_AMBULATORY_CARE_PROVIDER_SITE_OTHER): Payer: Medicare Other

## 2012-12-18 ENCOUNTER — Ambulatory Visit: Payer: Medicare Other

## 2012-12-18 DIAGNOSIS — Z1231 Encounter for screening mammogram for malignant neoplasm of breast: Secondary | ICD-10-CM | POA: Diagnosis not present

## 2012-12-20 DIAGNOSIS — M2010 Hallux valgus (acquired), unspecified foot: Secondary | ICD-10-CM | POA: Diagnosis not present

## 2012-12-20 DIAGNOSIS — M7750 Other enthesopathy of unspecified foot: Secondary | ICD-10-CM | POA: Diagnosis not present

## 2012-12-24 ENCOUNTER — Encounter: Payer: Self-pay | Admitting: Physician Assistant

## 2012-12-24 DIAGNOSIS — M7742 Metatarsalgia, left foot: Secondary | ICD-10-CM | POA: Insufficient documentation

## 2013-01-01 ENCOUNTER — Ambulatory Visit: Payer: Medicare Other | Admitting: Vascular Surgery

## 2013-01-02 ENCOUNTER — Other Ambulatory Visit: Payer: Self-pay | Admitting: Family Medicine

## 2013-01-02 ENCOUNTER — Other Ambulatory Visit: Payer: Self-pay | Admitting: *Deleted

## 2013-01-02 MED ORDER — TRAZODONE HCL 50 MG PO TABS: 50.0000 mg | ORAL_TABLET | Freq: Every day | ORAL | Status: DC

## 2013-01-02 MED ORDER — AMLODIPINE BESYLATE 5 MG PO TABS: ORAL_TABLET | ORAL | Status: DC

## 2013-01-02 MED ORDER — PRAVASTATIN SODIUM 40 MG PO TABS: ORAL_TABLET | ORAL | Status: DC

## 2013-01-03 ENCOUNTER — Other Ambulatory Visit: Payer: Self-pay | Admitting: Family Medicine

## 2013-01-07 DIAGNOSIS — M329 Systemic lupus erythematosus, unspecified: Secondary | ICD-10-CM | POA: Diagnosis not present

## 2013-01-18 ENCOUNTER — Other Ambulatory Visit: Payer: Self-pay | Admitting: Family Medicine

## 2013-02-04 DIAGNOSIS — M329 Systemic lupus erythematosus, unspecified: Secondary | ICD-10-CM | POA: Diagnosis not present

## 2013-02-15 DIAGNOSIS — M329 Systemic lupus erythematosus, unspecified: Secondary | ICD-10-CM | POA: Diagnosis not present

## 2013-03-07 ENCOUNTER — Telehealth: Payer: Self-pay | Admitting: *Deleted

## 2013-03-07 ENCOUNTER — Institutional Professional Consult (permissible substitution): Payer: Medicare Other | Admitting: Sports Medicine

## 2013-03-07 DIAGNOSIS — M25519 Pain in unspecified shoulder: Secondary | ICD-10-CM

## 2013-03-07 NOTE — Telephone Encounter (Signed)
Okay for place Ortho referral .  We can schedule wherever convenient for patient.

## 2013-03-07 NOTE — Telephone Encounter (Signed)
Pt calls and states that she seen rheumatologist for the Lupus and the MD said since pt has not responded to therapy he thinks she needs to see ortho. Pt would like to get an ortho referral for her shoulder. Barry Dienes, LPN

## 2013-03-19 ENCOUNTER — Ambulatory Visit: Payer: Medicare Other | Admitting: Vascular Surgery

## 2013-03-21 DIAGNOSIS — M542 Cervicalgia: Secondary | ICD-10-CM | POA: Diagnosis not present

## 2013-03-21 DIAGNOSIS — M7500 Adhesive capsulitis of unspecified shoulder: Secondary | ICD-10-CM | POA: Diagnosis not present

## 2013-03-26 ENCOUNTER — Ambulatory Visit: Payer: Medicare Other | Attending: Specialist | Admitting: Rehabilitation

## 2013-03-26 DIAGNOSIS — M542 Cervicalgia: Secondary | ICD-10-CM | POA: Insufficient documentation

## 2013-03-26 DIAGNOSIS — M25519 Pain in unspecified shoulder: Secondary | ICD-10-CM | POA: Diagnosis not present

## 2013-03-26 DIAGNOSIS — IMO0001 Reserved for inherently not codable concepts without codable children: Secondary | ICD-10-CM | POA: Insufficient documentation

## 2013-03-27 DIAGNOSIS — M1990 Unspecified osteoarthritis, unspecified site: Secondary | ICD-10-CM | POA: Diagnosis not present

## 2013-03-28 ENCOUNTER — Ambulatory Visit: Payer: Medicare Other | Admitting: Rehabilitation

## 2013-03-28 DIAGNOSIS — M25519 Pain in unspecified shoulder: Secondary | ICD-10-CM | POA: Diagnosis not present

## 2013-03-28 DIAGNOSIS — IMO0001 Reserved for inherently not codable concepts without codable children: Secondary | ICD-10-CM | POA: Diagnosis not present

## 2013-03-28 DIAGNOSIS — M542 Cervicalgia: Secondary | ICD-10-CM | POA: Diagnosis not present

## 2013-04-02 ENCOUNTER — Ambulatory Visit: Payer: Medicare Other | Admitting: Rehabilitation

## 2013-04-03 ENCOUNTER — Ambulatory Visit: Payer: Medicare Other | Admitting: Rehabilitation

## 2013-04-03 DIAGNOSIS — M542 Cervicalgia: Secondary | ICD-10-CM | POA: Diagnosis not present

## 2013-04-03 DIAGNOSIS — M25519 Pain in unspecified shoulder: Secondary | ICD-10-CM | POA: Diagnosis not present

## 2013-04-03 DIAGNOSIS — IMO0001 Reserved for inherently not codable concepts without codable children: Secondary | ICD-10-CM | POA: Diagnosis not present

## 2013-04-04 ENCOUNTER — Ambulatory Visit: Payer: Medicare Other | Admitting: Rehabilitation

## 2013-04-09 ENCOUNTER — Ambulatory Visit: Payer: Medicare Other | Admitting: Rehabilitation

## 2013-04-09 DIAGNOSIS — M542 Cervicalgia: Secondary | ICD-10-CM | POA: Diagnosis not present

## 2013-04-09 DIAGNOSIS — M25519 Pain in unspecified shoulder: Secondary | ICD-10-CM | POA: Diagnosis not present

## 2013-04-09 DIAGNOSIS — IMO0001 Reserved for inherently not codable concepts without codable children: Secondary | ICD-10-CM | POA: Diagnosis not present

## 2013-04-11 ENCOUNTER — Ambulatory Visit: Payer: Medicare Other | Admitting: Rehabilitation

## 2013-04-11 DIAGNOSIS — M542 Cervicalgia: Secondary | ICD-10-CM | POA: Diagnosis not present

## 2013-04-11 DIAGNOSIS — M25519 Pain in unspecified shoulder: Secondary | ICD-10-CM | POA: Diagnosis not present

## 2013-04-11 DIAGNOSIS — IMO0001 Reserved for inherently not codable concepts without codable children: Secondary | ICD-10-CM | POA: Diagnosis not present

## 2013-04-16 DIAGNOSIS — M7500 Adhesive capsulitis of unspecified shoulder: Secondary | ICD-10-CM | POA: Diagnosis not present

## 2013-04-17 ENCOUNTER — Ambulatory Visit: Payer: Medicare Other | Admitting: Rehabilitation

## 2013-04-24 DIAGNOSIS — M329 Systemic lupus erythematosus, unspecified: Secondary | ICD-10-CM | POA: Diagnosis not present

## 2013-05-16 ENCOUNTER — Other Ambulatory Visit: Payer: Self-pay | Admitting: Family Medicine

## 2013-05-27 ENCOUNTER — Ambulatory Visit: Payer: Medicare Other | Admitting: Physician Assistant

## 2013-05-28 DIAGNOSIS — M329 Systemic lupus erythematosus, unspecified: Secondary | ICD-10-CM | POA: Diagnosis not present

## 2013-05-29 ENCOUNTER — Encounter: Payer: Self-pay | Admitting: Physician Assistant

## 2013-05-29 ENCOUNTER — Ambulatory Visit (INDEPENDENT_AMBULATORY_CARE_PROVIDER_SITE_OTHER): Payer: Medicare Other | Admitting: Physician Assistant

## 2013-05-29 VITALS — BP 146/67 | HR 72 | Wt 217.0 lb

## 2013-05-29 DIAGNOSIS — E785 Hyperlipidemia, unspecified: Secondary | ICD-10-CM | POA: Diagnosis not present

## 2013-05-29 DIAGNOSIS — M62838 Other muscle spasm: Secondary | ICD-10-CM

## 2013-05-29 DIAGNOSIS — I10 Essential (primary) hypertension: Secondary | ICD-10-CM

## 2013-05-29 DIAGNOSIS — M329 Systemic lupus erythematosus, unspecified: Secondary | ICD-10-CM | POA: Insufficient documentation

## 2013-05-29 MED ORDER — AMLODIPINE BESYLATE 5 MG PO TABS: ORAL_TABLET | ORAL | Status: DC

## 2013-05-29 MED ORDER — CYCLOBENZAPRINE HCL 5 MG PO TABS: ORAL_TABLET | ORAL | Status: DC

## 2013-05-29 MED ORDER — PRAVASTATIN SODIUM 40 MG PO TABS: ORAL_TABLET | ORAL | Status: DC

## 2013-05-29 NOTE — Patient Instructions (Addendum)
Muscle cramps could certainly be lupus flare. Consider massages, muscle relaxers as needed and trail off cholesterol medication. Call if symptoms go away. Start back up on pravachol if symptoms do not resolve.

## 2013-05-29 NOTE — Progress Notes (Signed)
  Subjective:    Patient ID: Katherine Nixon, female    DOB: 07-06-1946, 67 y.o.   MRN: [id number]  HPI Patient presents to the clinic to follow up on Hypertension/hyperlipidemia and to get refills.   HTN- controlled denies any CP, palpitations, HA's, vision changes. She takes norvasc daily.   Hyperlipidemia-checked in January and controlled at LDL at 103. She recently she has started having what feels like muscle spasm in right mid back along with the ongoing muscle spasms or bilateral legs. Her rheumatologist feels like it is symptoms of lupus but she feels like it is something different. She takes prednisone daily but continually changing dose. Nothing makes muscle spasms worse except they seem to be worse at night. Nothing really makes better. Not tried anything except heat.    Review of Systems     Objective:   Physical Exam  Constitutional: She appears well-developed and well-nourished.  HENT:  Head: Normocephalic and atraumatic.  Cardiovascular: Normal rate, regular rhythm and normal heart sounds.   Pulmonary/Chest: Effort normal and breath sounds normal.  Musculoskeletal:  Paraspinous tightness but no tenderness over right middle back.   Skin: Skin is warm and dry.  Psychiatric: She has a normal mood and affect. Her behavior is normal.          Assessment & Plan:  HTN- Refilled norvasc. I think BP is controlled for age.   Hyperlipidemia/muscle spasms- since pt concerned about SE of statins. Told patient to stop statin for next 3-4 weeks and see if symptoms resolve. If so call office and will talk about other options to control LDL. In meantime try small dose of flexeril at bedtime. Heat and ice area. Epson salt baths. Deep tissue massage. Magnesium 500mg  could also help. If worsening call office for follow up.

## 2013-05-31 ENCOUNTER — Telehealth: Payer: Self-pay | Admitting: Physician Assistant

## 2013-05-31 DIAGNOSIS — M62838 Other muscle spasm: Secondary | ICD-10-CM

## 2013-05-31 NOTE — Telephone Encounter (Signed)
I looked and asked around and there is a place [facility] that can do PT or clinical massage. Are you interested in a referral? I know we talked about massage at last visit.

## 2013-05-31 NOTE — Telephone Encounter (Signed)
LMOM for pt to return our call if interested in doing referral to [facility] for massage or PT. Barry Dienes, LPN

## 2013-06-03 NOTE — Telephone Encounter (Signed)
Pt called back on Friday & left vm stating that she would like the referral for massage.

## 2013-06-03 NOTE — Telephone Encounter (Signed)
Ok to enter referral for massage for place below.

## 2013-06-03 NOTE — Telephone Encounter (Signed)
Referral placed.

## 2013-06-18 DIAGNOSIS — M329 Systemic lupus erythematosus, unspecified: Secondary | ICD-10-CM | POA: Diagnosis not present

## 2013-07-02 DIAGNOSIS — L259 Unspecified contact dermatitis, unspecified cause: Secondary | ICD-10-CM | POA: Diagnosis not present

## 2013-07-02 DIAGNOSIS — L570 Actinic keratosis: Secondary | ICD-10-CM | POA: Diagnosis not present

## 2013-07-16 DIAGNOSIS — M329 Systemic lupus erythematosus, unspecified: Secondary | ICD-10-CM | POA: Diagnosis not present

## 2013-07-27 ENCOUNTER — Other Ambulatory Visit: Payer: Self-pay | Admitting: Family Medicine

## 2013-08-06 DIAGNOSIS — IMO0001 Reserved for inherently not codable concepts without codable children: Secondary | ICD-10-CM | POA: Diagnosis not present

## 2013-09-17 DIAGNOSIS — IMO0001 Reserved for inherently not codable concepts without codable children: Secondary | ICD-10-CM | POA: Diagnosis not present

## 2013-09-26 ENCOUNTER — Other Ambulatory Visit: Payer: Self-pay

## 2013-10-11 ENCOUNTER — Encounter: Payer: Self-pay | Admitting: Family Medicine

## 2013-10-11 ENCOUNTER — Ambulatory Visit (INDEPENDENT_AMBULATORY_CARE_PROVIDER_SITE_OTHER): Payer: Medicare Other

## 2013-10-11 ENCOUNTER — Ambulatory Visit (INDEPENDENT_AMBULATORY_CARE_PROVIDER_SITE_OTHER): Payer: Medicare Other | Admitting: Family Medicine

## 2013-10-11 VITALS — BP 156/78 | HR 79 | Wt 222.0 lb

## 2013-10-11 DIAGNOSIS — M549 Dorsalgia, unspecified: Secondary | ICD-10-CM | POA: Diagnosis not present

## 2013-10-11 DIAGNOSIS — M25551 Pain in right hip: Secondary | ICD-10-CM

## 2013-10-11 DIAGNOSIS — M1710 Unilateral primary osteoarthritis, unspecified knee: Secondary | ICD-10-CM | POA: Diagnosis not present

## 2013-10-11 DIAGNOSIS — M25511 Pain in right shoulder: Secondary | ICD-10-CM

## 2013-10-11 DIAGNOSIS — M329 Systemic lupus erythematosus, unspecified: Secondary | ICD-10-CM

## 2013-10-11 DIAGNOSIS — M19019 Primary osteoarthritis, unspecified shoulder: Secondary | ICD-10-CM | POA: Diagnosis not present

## 2013-10-11 DIAGNOSIS — M25559 Pain in unspecified hip: Secondary | ICD-10-CM | POA: Diagnosis not present

## 2013-10-11 DIAGNOSIS — M25461 Effusion, right knee: Secondary | ICD-10-CM

## 2013-10-11 DIAGNOSIS — M47817 Spondylosis without myelopathy or radiculopathy, lumbosacral region: Secondary | ICD-10-CM | POA: Diagnosis not present

## 2013-10-11 DIAGNOSIS — M25569 Pain in unspecified knee: Secondary | ICD-10-CM

## 2013-10-11 DIAGNOSIS — M25469 Effusion, unspecified knee: Secondary | ICD-10-CM | POA: Diagnosis not present

## 2013-10-11 DIAGNOSIS — M545 Low back pain: Secondary | ICD-10-CM

## 2013-10-11 DIAGNOSIS — M25519 Pain in unspecified shoulder: Secondary | ICD-10-CM | POA: Diagnosis not present

## 2013-10-11 DIAGNOSIS — Q7649 Other congenital malformations of spine, not associated with scoliosis: Secondary | ICD-10-CM

## 2013-10-11 DIAGNOSIS — M169 Osteoarthritis of hip, unspecified: Secondary | ICD-10-CM | POA: Diagnosis not present

## 2013-10-11 MED ORDER — TRAMADOL HCL 50 MG PO TABS: 50.0000 mg | ORAL_TABLET | Freq: Three times a day (TID) | ORAL | Status: DC | PRN

## 2013-10-11 MED ORDER — PREDNISONE 20 MG PO TABS: 20.0000 mg | ORAL_TABLET | Freq: Every day | ORAL | Status: DC

## 2013-10-11 NOTE — Patient Instructions (Signed)
Please schedule with Dr. Karie Schwalbe for next week.

## 2013-10-11 NOTE — Progress Notes (Signed)
Subjective:    Patient ID: Katherine Nixon, female    DOB: 10-31-1946, 67 y.o.   MRN: [id number]  HPI  Has been seeing Dr. Virgel Nixon for the last 15 months.  Has possible working dx of SLE.  Labs have been up and down.  He has been treated moslty with  Prednisone over the last year up until august..  Recently tried on hydroxychoroquine w/out relief.  Having pain in knees, wrists shoulder, hips. Says most days she feels like an old lady.  Feels like she is losing strength as well. She feels the pain has made her more sedentary and in turn has gradually become more weak. She is now using her upper arms help pull herself up. Says uses lots of motrin and using heating pad.  She can't sleep well.  Feels like her pain is constant.  She is not currently off of the prednisone. Did feel better on the 5mg  prednisone.  Hip of right hip about 3 years ago.  Had shot then and did well.  Occ gets pain in her left buttock that radiates to her knee. She also complains of bilateral knee pain. She says sometimes it feels like there some pink swollen behind both of her knees and makes it painful and hard to completely flex. That got better on hte steroids on well.  Says heat does help. Uses motrin 400mg  in AM and tylenol at noon and bedtime.  Then motrin again in evening.    Review of Systems BP 156/78  Pulse 79  Wt 222 lb (100.699 kg)    Allergies  Allergen Reactions  . Bactrim Rash    Past Medical History  Diagnosis Date  . Squamous cell carcinoma     Past Surgical History  Procedure Laterality Date  . Nasal sinus surgery    . Laparoscopic cholecystectomy    . Breast surgery      biopsies/ aspirations - neg  . Dilation and curettage of uterus    . Varicose vein surgery    . Cholecystectomy      History   Social History  . Marital Status: Married    Spouse Name: Katherine Nixon    Number of Children: 4  . Years of Education: 15   Occupational History  . retired     med assist, Teacher, adult education    Social History Main Topics  . Smoking status: Former Smoker    Types: Cigarettes    Quit date: 11/22/1987  . Smokeless tobacco: Never Used  . Alcohol Use: No  . Drug Use: No  . Sexual Activity: No   Other Topics Concern  . Not on file   Social History Narrative  . No narrative on file    Family History  Problem Relation Age of Onset  . Heart disease Mother   . Heart attack Mother   . Hyperlipidemia Father   . Other Father     amputaiton  . Hyperlipidemia Brother   . Hypertension Brother     Outpatient Encounter Prescriptions as of 10/11/2013  Medication Sig  . AMBULATORY NON FORMULARY MEDICATION Medication Name: Zostavax IM x 1  . amLODipine (NORVASC) 5 MG tablet TAKE 1 TABLET (5 MG TOTAL) BY MOUTH DAILY.  . cyclobenzaprine (FLEXERIL) 5 MG tablet At bedtime for muscle spasms.  . traZODone (DESYREL) 50 MG tablet TAKE 1 TABLET AT BEDTIME  . predniSONE (DELTASONE) 20 MG tablet Take 1 tablet (20 mg total) by mouth daily.  . traMADol (ULTRAM) 50 MG tablet Take 1  tablet (50 mg total) by mouth every 8 (eight) hours as needed.  . [DISCONTINUED] pravastatin (PRAVACHOL) 40 MG tablet TAKE 1 TABLET AT BEDTIME  . [DISCONTINUED] predniSONE (DELTASONE) 20 MG tablet Take 1 tablet (20 mg total) by mouth daily.  . [DISCONTINUED] PREDNISONE PO Take by mouth daily. 5-10 for lupus.          Objective:   Physical Exam  Constitutional: She is oriented to person, place, and time. She appears well-developed and well-nourished.  HENT:  Head: Normocephalic and atraumatic.  Musculoskeletal:  Neck with normal flexion extension and rotation. Shoulders with normal extension and internal rotation. Slightly decreased reach with crossover to the opposite shoulder with her right arm compared to the left. Positive empty can test on the right. Upper extremity and the extremity reflexes 1+ bilaterally. Hip, knee, ankle strength is 5 of 5 bilaterally. Nontender over the lumbar spine. Hips with normal  flexion. She has significant discomfort with external rotation of both hips. She is tender over the medial and lateral joint lines on both knees. + crepitus at knees bilat. Significant discomfort with full flexion.  Neurological: She is alert and oriented to person, place, and time.  Skin: Skin is warm and dry.  Psychiatric: She has a normal mood and affect. Her behavior is normal.          Assessment & Plan:  Right shoulder pain-suspect bursitis. Since this is been going on for several months I would like to get an x-ray today. She did have a shot in the left shoulder several years ago and that was helpful. This might be helpful for the right as well if her symptoms are consistent with bursitis.  Bilateral hip pain-suspect osteoporosis this is since she has significant pain with external rotation bilaterally. We'll call with results once available.  Bilateral knee pain-again suspect osteoarthritis. Certainly her multiple joints could be related to her lupus as well. She has responded really well to prednisone in the past Her that this is not a long-term treatment. I will give her a burst of 20 mg for the next 10 days to please get her some acute relief. Also given a small prescription for tramadol.  Lupus-she would like to see a rheumatologist who specializes in lupus. Will place referral to Dr. Cory Nixon at [facility] per patient preference.

## 2013-10-14 ENCOUNTER — Encounter: Payer: Self-pay | Admitting: Sports Medicine

## 2013-10-14 ENCOUNTER — Ambulatory Visit (INDEPENDENT_AMBULATORY_CARE_PROVIDER_SITE_OTHER): Payer: Medicare Other | Admitting: Sports Medicine

## 2013-10-14 VITALS — BP 149/68 | HR 82 | Wt 217.0 lb

## 2013-10-14 DIAGNOSIS — M1610 Unilateral primary osteoarthritis, unspecified hip: Secondary | ICD-10-CM

## 2013-10-14 DIAGNOSIS — M169 Osteoarthritis of hip, unspecified: Secondary | ICD-10-CM | POA: Diagnosis not present

## 2013-10-14 DIAGNOSIS — M160 Bilateral primary osteoarthritis of hip: Secondary | ICD-10-CM | POA: Insufficient documentation

## 2013-10-14 NOTE — Progress Notes (Addendum)
Patient ID: Katherine Nixon, female   DOB: 17-Dec-1945, 67 y.o.   MRN: [id number]   Subjective:    I'm seeing this patient as a consultation for:  Dr Linford Arnold  CC: Multiple Joint Pain  HPI: This is a 67 y.o female who is seen in clinic today for multiple joint pain. She reports pain in both knees, hips and shoulder. Of note, patient reports that she has a diagnosis of lupus. She was started on a 10 day course Prednisone 20 mg. She does report some improvement since starting the prednisone but she does not report any where near full recovery. She states that her right hip is the most troublesome for her.  Hip pain usually worse during long periods of sitting down or lying on her back. She reports that he pain keeps her up at night. She also uses a heat pad which provides her with little comfort. Standing in a hot shower and stretching exercise have provided little improvement.   Past medical history, Surgical history, Family history not pertinant except as noted below, Social history, Allergies, and medications have been entered into the medical record, reviewed, and no changes needed.   Review of Systems: No headache, visual changes, nausea, vomiting, diarrhea, constipation, dizziness, abdominal pain, skin rash, fevers, chills, night sweats, weight loss, swollen lymph nodes, body aches, joint swelling, muscle aches, chest pain, shortness of breath, mood changes, visual or auditory hallucinations.   Objective:   General: Well Developed, well nourished, and in no acute distress.  Neuro/Psych: Alert and oriented x3, extra-ocular muscles intact, able to move all 4 extremities, sensation grossly intact. Skin: Warm and dry, no rashes noted.  Respiratory: Not using accessory muscles, speaking in full sentences, trachea midline.  Cardiovascular: Pulses palpable, no extremity edema. Abdomen: Does not appear distended. Hips: Pain on external rotation of both hips Shoulder: Pain on flexion above 90 degrees  of right shoulder  Procedure: Real-time Ultrasound Guided Injection of left femoroacetabular joint Device: GE Logiq E  Verbal informed consent obtained.  Time-out conducted.  Noted no overlying erythema, induration, or other signs of local infection.  Skin prepped in a sterile fashion.  Local anesthesia: Topical Ethyl chloride.  With sterile technique and under real time ultrasound guidance:  Spinal needle advanced to the femoral head/neck junction, noted hip joint effusion, 2 cc Kenalog 40, 4 cc lidocaine injected easily.  Completed without difficulty  Pain immediately resolved suggesting accurate placement of the medication.  Advised to call if fevers/chills, erythema, induration, drainage, or persistent bleeding.  Images permanently stored and available for review in the ultrasound unit.  Impression: Technically successful ultrasound guided injection.  Procedure: Real-time Ultrasound Guided Injection of right femoroacetabular joint Device: GE Logiq E  Verbal informed consent obtained.  Time-out conducted.  Noted no overlying erythema, induration, or other signs of local infection.  Skin prepped in a sterile fashion.  Local anesthesia: Topical Ethyl chloride.  With sterile technique and under real time ultrasound guidance:  Spinal needle advanced to the femoral head/neck junction, noted hip joint effusion, 2 cc Kenalog 40, 4 cc lidocaine injected easily.  Completed without difficulty  Pain immediately resolved suggesting accurate placement of the medication.  Advised to call if fevers/chills, erythema, induration, drainage, or persistent bleeding.  Images permanently stored and available for review in the ultrasound unit.  Impression: Technically successful ultrasound guided injection.  Impression and Recommendations:   This case required medical decision making of moderate complexity. Symptoms are consistent with osteoarthritis:  -We will inject both hip  joint with a mixture  triamcinolone and lidocaine under ultrasound guidance. -Patient advised to follow up in a month.   I spent over 40 minutes with this patient, greater than 50% was face-to-face time counseling regarding the multiple below diagnoses.

## 2013-10-14 NOTE — Assessment & Plan Note (Addendum)
Bilateral injections into the femoroacetabular joint as above, there were moderate joint effusions bilaterally. 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 She does have knee osteoarthritis, lumbar DDD, and bilateral hip osteoarthritis radiographically which correspond to her sites of pain. Like to see her back in approximately one month, we can consider further intervention on the knees/shoulders at future visits. Certainly if she does not have a good or sustained response to interventional treatment we certainly could start methotrexate. I am also going to send her to PT for aggressive hip flexor, abductor strengthening. I have asked her to keep her appointments with her rheumatologists.

## 2013-10-28 ENCOUNTER — Ambulatory Visit: Payer: Medicare Other | Admitting: Physical Therapy

## 2013-10-28 DIAGNOSIS — M169 Osteoarthritis of hip, unspecified: Secondary | ICD-10-CM

## 2013-10-28 DIAGNOSIS — M2550 Pain in unspecified joint: Secondary | ICD-10-CM

## 2013-10-28 DIAGNOSIS — M2560 Stiffness of unspecified joint, not elsewhere classified: Secondary | ICD-10-CM

## 2013-10-28 DIAGNOSIS — M6281 Muscle weakness (generalized): Secondary | ICD-10-CM

## 2013-10-31 DIAGNOSIS — M2550 Pain in unspecified joint: Secondary | ICD-10-CM

## 2013-10-31 DIAGNOSIS — R262 Difficulty in walking, not elsewhere classified: Secondary | ICD-10-CM

## 2013-10-31 DIAGNOSIS — M6281 Muscle weakness (generalized): Secondary | ICD-10-CM

## 2013-10-31 DIAGNOSIS — M2560 Stiffness of unspecified joint, not elsewhere classified: Secondary | ICD-10-CM

## 2013-10-31 DIAGNOSIS — M169 Osteoarthritis of hip, unspecified: Secondary | ICD-10-CM

## 2013-11-04 ENCOUNTER — Encounter (INDEPENDENT_AMBULATORY_CARE_PROVIDER_SITE_OTHER): Payer: Medicare Other | Admitting: Physical Therapy

## 2013-11-04 DIAGNOSIS — M169 Osteoarthritis of hip, unspecified: Secondary | ICD-10-CM

## 2013-11-04 DIAGNOSIS — M2560 Stiffness of unspecified joint, not elsewhere classified: Secondary | ICD-10-CM

## 2013-11-04 DIAGNOSIS — M6281 Muscle weakness (generalized): Secondary | ICD-10-CM

## 2013-11-04 DIAGNOSIS — R262 Difficulty in walking, not elsewhere classified: Secondary | ICD-10-CM

## 2013-11-04 DIAGNOSIS — M2550 Pain in unspecified joint: Secondary | ICD-10-CM

## 2013-11-07 ENCOUNTER — Encounter (INDEPENDENT_AMBULATORY_CARE_PROVIDER_SITE_OTHER): Payer: Medicare Other

## 2013-11-07 DIAGNOSIS — M2550 Pain in unspecified joint: Secondary | ICD-10-CM

## 2013-11-07 DIAGNOSIS — M2560 Stiffness of unspecified joint, not elsewhere classified: Secondary | ICD-10-CM

## 2013-11-07 DIAGNOSIS — R262 Difficulty in walking, not elsewhere classified: Secondary | ICD-10-CM

## 2013-11-07 DIAGNOSIS — M6281 Muscle weakness (generalized): Secondary | ICD-10-CM

## 2013-11-07 DIAGNOSIS — M169 Osteoarthritis of hip, unspecified: Secondary | ICD-10-CM

## 2013-11-11 ENCOUNTER — Encounter: Payer: Self-pay | Admitting: Sports Medicine

## 2013-11-11 ENCOUNTER — Ambulatory Visit (INDEPENDENT_AMBULATORY_CARE_PROVIDER_SITE_OTHER): Payer: Medicare Other | Admitting: Sports Medicine

## 2013-11-11 VITALS — BP 139/71 | HR 76 | Wt 218.0 lb

## 2013-11-11 DIAGNOSIS — M1710 Unilateral primary osteoarthritis, unspecified knee: Secondary | ICD-10-CM

## 2013-11-11 DIAGNOSIS — M51369 Other intervertebral disc degeneration, lumbar region without mention of lumbar back pain or lower extremity pain: Secondary | ICD-10-CM | POA: Insufficient documentation

## 2013-11-11 DIAGNOSIS — M5137 Other intervertebral disc degeneration, lumbosacral region: Secondary | ICD-10-CM

## 2013-11-11 DIAGNOSIS — M170 Bilateral primary osteoarthritis of knee: Secondary | ICD-10-CM | POA: Insufficient documentation

## 2013-11-11 DIAGNOSIS — M5136 Other intervertebral disc degeneration, lumbar region: Secondary | ICD-10-CM | POA: Insufficient documentation

## 2013-11-11 DIAGNOSIS — M51379 Other intervertebral disc degeneration, lumbosacral region without mention of lumbar back pain or lower extremity pain: Secondary | ICD-10-CM | POA: Diagnosis not present

## 2013-11-11 DIAGNOSIS — IMO0002 Reserved for concepts with insufficient information to code with codable children: Secondary | ICD-10-CM

## 2013-11-11 DIAGNOSIS — M160 Bilateral primary osteoarthritis of hip: Secondary | ICD-10-CM

## 2013-11-11 MED ORDER — IBUPROFEN 800 MG PO TABS: 800.0000 mg | ORAL_TABLET | Freq: Three times a day (TID) | ORAL | Status: DC | PRN

## 2013-11-11 NOTE — Assessment & Plan Note (Signed)
Persistent and has failed oral medicines and physical therapy. At this point we are going to pursue an MRI for interventional injection planning. Return to go over results of the MRI.

## 2013-11-11 NOTE — Assessment & Plan Note (Signed)
Resolved with injection.  

## 2013-11-11 NOTE — Progress Notes (Signed)
  Subjective:    CC: Follow up  HPI: Jorita has multiple joint symptoms, she is seeing a rheumatologist. She likely falls into the gray area between lupus and mixed connective tissue disease. I performed bilateral femoral acetabular joint injections at the last visit, and she returns with hip pain essentially resolved. Unfortunately she now has pain that she localizes at the medial joint line both knees, she does have x-rays in the system, and has a history of knee osteoarthritis. She has been using ibuprofen 600 mg several times per day without improvement. She does desire interventional treatment. Pain is severe, persistent. No radiation.  Past medical history, Surgical history, Family history not pertinant except as noted below, Social history, Allergies, and medications have been entered into the medical record, reviewed, and no changes needed.   Review of Systems: No fevers, chills, night sweats, weight loss, chest pain, or shortness of breath.   Objective:    General: Well Developed, well nourished, and in no acute distress.  Neuro: Alert and oriented x3, extra-ocular muscles intact, sensation grossly intact.  HEENT: Normocephalic, atraumatic, pupils equal round reactive to light, neck supple, no masses, no lymphadenopathy, thyroid nonpalpable.  Skin: Warm and dry, no rashes. Cardiac: Regular rate and rhythm, no murmurs rubs or gallops, no lower extremity edema.  Respiratory: Clear to auscultation bilaterally. Not using accessory muscles, speaking in full sentences. Bilateral Knee: Minimal swelling but significant pain along the medial joint lines. ROM full in flexion and extension and lower leg rotation. Ligaments with solid consistent endpoints including ACL, PCL, LCL, MCL. Negative Mcmurray's, Apley's, and Thessalonian tests. Non painful patellar compression. Patellar glide without crepitus. Patellar and quadriceps tendons unremarkable. Hamstring and quadriceps strength is  normal.   Procedure: Real-time Ultrasound Guided Injection of right knee Device: GE Logiq E  Verbal informed consent obtained.  Time-out conducted.  Noted no overlying erythema, induration, or other signs of local infection.  Skin prepped in a sterile fashion.  Local anesthesia: Topical Ethyl chloride.  With sterile technique and under real time ultrasound guidance:  1 cc Kenalog 40, 4 cc lidocaine injected easily into the suprapatellar recess. Completed without difficulty  Pain immediately resolved suggesting accurate placement of the medication.  Advised to call if fevers/chills, erythema, induration, drainage, or persistent bleeding.  Images permanently stored and available for review in the ultrasound unit.  Impression: Technically successful ultrasound guided injection.  Procedure: Real-time Ultrasound Guided Injection of left knee Device: GE Logiq E  Verbal informed consent obtained.  Time-out conducted.  Noted no overlying erythema, induration, or other signs of local infection.  Skin prepped in a sterile fashion.  Local anesthesia: Topical Ethyl chloride.  With sterile technique and under real time ultrasound guidance:  1 cc Kenalog 40, 4 cc lidocaine injected easily into the suprapatellar recess. Completed without difficulty  Pain immediately resolved suggesting accurate placement of the medication.  Advised to call if fevers/chills, erythema, induration, drainage, or persistent bleeding.  Images permanently stored and available for review in the ultrasound unit.  Impression: Technically successful ultrasound guided injection.  Impression and Recommendations:

## 2013-11-11 NOTE — Assessment & Plan Note (Addendum)
Injection performed bilaterally today. Ibuprofen 800. She did get nauseated with tramadol. Return in one month.

## 2013-11-19 ENCOUNTER — Ambulatory Visit (INDEPENDENT_AMBULATORY_CARE_PROVIDER_SITE_OTHER): Payer: Medicare Other

## 2013-11-19 DIAGNOSIS — M48061 Spinal stenosis, lumbar region without neurogenic claudication: Secondary | ICD-10-CM

## 2013-11-19 DIAGNOSIS — M5137 Other intervertebral disc degeneration, lumbosacral region: Secondary | ICD-10-CM | POA: Diagnosis not present

## 2013-11-19 DIAGNOSIS — M47817 Spondylosis without myelopathy or radiculopathy, lumbosacral region: Secondary | ICD-10-CM | POA: Diagnosis not present

## 2013-11-19 DIAGNOSIS — M5136 Other intervertebral disc degeneration, lumbar region: Secondary | ICD-10-CM

## 2013-12-16 ENCOUNTER — Ambulatory Visit (INDEPENDENT_AMBULATORY_CARE_PROVIDER_SITE_OTHER): Payer: Medicare Other | Admitting: Sports Medicine

## 2013-12-16 ENCOUNTER — Encounter: Payer: Self-pay | Admitting: Sports Medicine

## 2013-12-16 VITALS — BP 135/60 | HR 83 | Wt 222.0 lb

## 2013-12-16 DIAGNOSIS — M51379 Other intervertebral disc degeneration, lumbosacral region without mention of lumbar back pain or lower extremity pain: Secondary | ICD-10-CM

## 2013-12-16 DIAGNOSIS — S86111A Strain of other muscle(s) and tendon(s) of posterior muscle group at lower leg level, right leg, initial encounter: Secondary | ICD-10-CM | POA: Insufficient documentation

## 2013-12-16 DIAGNOSIS — M5136 Other intervertebral disc degeneration, lumbar region: Secondary | ICD-10-CM

## 2013-12-16 DIAGNOSIS — S86819A Strain of other muscle(s) and tendon(s) at lower leg level, unspecified leg, initial encounter: Secondary | ICD-10-CM

## 2013-12-16 DIAGNOSIS — M5137 Other intervertebral disc degeneration, lumbosacral region: Secondary | ICD-10-CM

## 2013-12-16 DIAGNOSIS — S838X9A Sprain of other specified parts of unspecified knee, initial encounter: Secondary | ICD-10-CM

## 2013-12-16 DIAGNOSIS — M51369 Other intervertebral disc degeneration, lumbar region without mention of lumbar back pain or lower extremity pain: Secondary | ICD-10-CM

## 2013-12-16 MED ORDER — VENLAFAXINE HCL ER 75 MG PO CP24: 75.0000 mg | ORAL_CAPSULE | Freq: Every day | ORAL | Status: DC

## 2013-12-16 MED ORDER — TRAZODONE HCL 50 MG PO TABS: 50.0000 mg | ORAL_TABLET | Freq: Every day | ORAL | Status: DC

## 2013-12-16 NOTE — Progress Notes (Addendum)
  Subjective:    CC: Follow up MRI results  HPI: Low back pain: Chronic, she has an axial component that is worse with flexion in sitting, she does have a radicular component which radiates down the left leg in an L5 distribution. She has failed physical therapy, steroids, NSAIDs. Her pain is making her depressed to the point where she does not like to leave home, and she is significantly anxious. She denies any suicidal or homicidal ideation.  Right lower leg pain: Took a misstep about a week ago, felt a pop, now has pain of the medial head of the gastrocnemius. Moderate, persistent without radiation.  Past medical history, Surgical history, Family history not pertinant except as noted below, Social history, Allergies, and medications have been entered into the medical record, reviewed, and no changes needed.   Review of Systems: No fevers, chills, night sweats, weight loss, chest pain, or shortness of breath.   Objective:    General: Well Developed, well nourished, and in no acute distress.  Neuro: Alert and oriented x3, extra-ocular muscles intact, sensation grossly intact.  HEENT: Normocephalic, atraumatic, pupils equal round reactive to light, neck supple, no masses, no lymphadenopathy, thyroid nonpalpable.  Skin: Warm and dry, no rashes. Cardiac: Regular rate and rhythm, no murmurs rubs or gallops, no lower extremity edema.  Respiratory: Clear to auscultation bilaterally. Not using accessory muscles, speaking in full sentences. Right lower leg: tender to palpation of the gastrocnemius, negative Thompson's test.  MRI was reviewed, there are multilevel degenerative changes in the lower thoracic spine, as well as the L2-L3, L3-L4, and L4-L5 levels. 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

## 2013-12-16 NOTE — Assessment & Plan Note (Signed)
Compression wrap placed. Home rehabilitation given. Return as needed for this, this will get better on its own.

## 2013-12-16 NOTE — Assessment & Plan Note (Signed)
Katherine Nixon has multilevel disc protrusions, 5 levels in fact. I do think that her symptoms are coming from the L4-L5 level based on the MRI numbering scheme as she does have transitional vertebrae. She has failed conservative measures including physical therapy, steroids, and since. At this point we are going to proceed with a bilateral L4-L5 interlaminar epidural injection. I would like to see her back approximately 2-3 weeks after the injection. She does have some depressive symptoms, this likely will make it difficult to fully control her symptoms with orthopedic only treatment. I'm going to add Effexor.

## 2013-12-18 ENCOUNTER — Ambulatory Visit
Admission: RE | Admit: 2013-12-18 | Discharge: 2013-12-18 | Disposition: A | Discharge status: Home / Self Care | Payer: Medicare Other | Source: Ambulatory Visit | Attending: Sports Medicine | Admitting: Sports Medicine

## 2013-12-18 VITALS — BP 181/77 | HR 72

## 2013-12-18 DIAGNOSIS — M545 Low back pain, unspecified: Secondary | ICD-10-CM | POA: Diagnosis not present

## 2013-12-18 DIAGNOSIS — M5136 Other intervertebral disc degeneration, lumbar region: Secondary | ICD-10-CM

## 2013-12-18 MED ORDER — METHYLPREDNISOLONE ACETATE 40 MG/ML INJ SUSP (RADIOLOG
120.0000 mg | Freq: Once | INTRAMUSCULAR | Status: AC
  Administered 2013-12-18: 120 mg via EPIDURAL

## 2013-12-18 MED ORDER — IOHEXOL 180 MG/ML  SOLN
1.0000 mL | Freq: Once | INTRAMUSCULAR | Status: AC | PRN
  Administered 2013-12-18: 1 mL via EPIDURAL

## 2013-12-18 NOTE — Discharge Instructions (Signed)

## 2014-01-03 ENCOUNTER — Ambulatory Visit (INDEPENDENT_AMBULATORY_CARE_PROVIDER_SITE_OTHER): Payer: Medicare Other | Admitting: Sports Medicine

## 2014-01-03 ENCOUNTER — Encounter: Payer: Self-pay | Admitting: Sports Medicine

## 2014-01-03 VITALS — BP 155/74 | HR 89 | Ht 66.0 in | Wt 220.0 lb

## 2014-01-03 DIAGNOSIS — M1610 Unilateral primary osteoarthritis, unspecified hip: Secondary | ICD-10-CM | POA: Diagnosis not present

## 2014-01-03 DIAGNOSIS — M169 Osteoarthritis of hip, unspecified: Secondary | ICD-10-CM

## 2014-01-03 DIAGNOSIS — M5137 Other intervertebral disc degeneration, lumbosacral region: Secondary | ICD-10-CM | POA: Diagnosis not present

## 2014-01-03 DIAGNOSIS — M51379 Other intervertebral disc degeneration, lumbosacral region without mention of lumbar back pain or lower extremity pain: Secondary | ICD-10-CM

## 2014-01-03 DIAGNOSIS — M5136 Other intervertebral disc degeneration, lumbar region: Secondary | ICD-10-CM

## 2014-01-03 DIAGNOSIS — M51369 Other intervertebral disc degeneration, lumbar region without mention of lumbar back pain or lower extremity pain: Secondary | ICD-10-CM

## 2014-01-03 DIAGNOSIS — M160 Bilateral primary osteoarthritis of hip: Secondary | ICD-10-CM

## 2014-01-03 NOTE — Progress Notes (Signed)
  Subjective:    CC: Followup  HPI: Lumbar degenerative disc disease: Multilevel, recently had an L4-L5 epidural, this improved her back pain approximately 75%, her radicular symptoms running down the posterior aspect of her left leg have resolved. She continues to have some pain that she localizes over the left anterior thigh. She did not take the Effexor.  Knee osteoarthritis: Resolved after injection.  Hip osteoarthritis: Injection was approximately 3 months ago, pain is now starting to return in the groin.  Past medical history, Surgical history, Family history not pertinant except as noted below, Social history, Allergies, and medications have been entered into the medical record, reviewed, and no changes needed.   Review of Systems: No fevers, chills, night sweats, weight loss, chest pain, or shortness of breath.   Objective:    General: Well Developed, well nourished, and in no acute distress.  Neuro: Alert and oriented x3, extra-ocular muscles intact, sensation grossly intact.  HEENT: Normocephalic, atraumatic, pupils equal round reactive to light, neck supple, no masses, no lymphadenopathy, thyroid nonpalpable.  Skin: Warm and dry, no rashes. Cardiac: Regular rate and rhythm, no murmurs rubs or gallops, no lower extremity edema.  Respiratory: Clear to auscultation bilaterally. Not using accessory muscles, speaking in full sentences.  MRI was again reviewed, there is multilevel degenerative disc disease worse at the L2-3, L3-4, L4-5 levels.  Impression and Recommendations:

## 2014-01-03 NOTE — Assessment & Plan Note (Signed)
Excellent response to bilateral hip joint injection 3 months ago. She sta starting to have recurrent pain in the groin bilaterally. When I see her back next month, she will be 4 months out. We can repeat the injection.

## 2014-01-03 NOTE — Assessment & Plan Note (Addendum)
Excellent response to an L4-L5 interlaminar epidural. She does now have persistent radicular symptoms on the left in L2 versus L3 distribution. Next month like to see her back and we can plan on an epidural a higher level.  She did not start Effexor, I have advised that she does need to start it, we are using this medication not for its antidepressant effect but for its neuropathic effect.

## 2014-01-17 ENCOUNTER — Ambulatory Visit (INDEPENDENT_AMBULATORY_CARE_PROVIDER_SITE_OTHER): Payer: Medicare Other | Admitting: Sports Medicine

## 2014-01-17 ENCOUNTER — Encounter: Payer: Self-pay | Admitting: Sports Medicine

## 2014-01-17 VITALS — BP 133/65 | HR 72 | Ht 66.0 in | Wt 220.0 lb

## 2014-01-17 DIAGNOSIS — M169 Osteoarthritis of hip, unspecified: Secondary | ICD-10-CM

## 2014-01-17 DIAGNOSIS — M25519 Pain in unspecified shoulder: Secondary | ICD-10-CM

## 2014-01-17 DIAGNOSIS — M51369 Other intervertebral disc degeneration, lumbar region without mention of lumbar back pain or lower extremity pain: Secondary | ICD-10-CM

## 2014-01-17 DIAGNOSIS — M5136 Other intervertebral disc degeneration, lumbar region: Secondary | ICD-10-CM

## 2014-01-17 DIAGNOSIS — M51379 Other intervertebral disc degeneration, lumbosacral region without mention of lumbar back pain or lower extremity pain: Secondary | ICD-10-CM

## 2014-01-17 DIAGNOSIS — M160 Bilateral primary osteoarthritis of hip: Secondary | ICD-10-CM

## 2014-01-17 DIAGNOSIS — M1610 Unilateral primary osteoarthritis, unspecified hip: Secondary | ICD-10-CM | POA: Diagnosis not present

## 2014-01-17 DIAGNOSIS — Z9889 Other specified postprocedural states: Secondary | ICD-10-CM | POA: Insufficient documentation

## 2014-01-17 DIAGNOSIS — M5137 Other intervertebral disc degeneration, lumbosacral region: Secondary | ICD-10-CM

## 2014-01-17 DIAGNOSIS — M25511 Pain in right shoulder: Secondary | ICD-10-CM

## 2014-01-17 NOTE — Assessment & Plan Note (Signed)
Right subacromial injection. Formal PT. Return in one month.

## 2014-01-17 NOTE — Progress Notes (Signed)
  Subjective:    CC: Follow up  HPI: Lumbar spondylosis: One-month post left-sided L4-L5 interlaminar epidural, she had a fantastic response to some of her pain is returning. She feels as though her pain is more on the anterior thigh now however she does have some down the posterior thigh in a lower lumbar distribution as well.  Hip osteoarthritis: Left groin is hurting, desires repeat interventional treatment.  Right shoulder pain: Localized over the deltoid, worse with overhead activities, moderate, persistent without radiation.  Past medical history, Surgical history, Family history not pertinant except as noted below, Social history, Allergies, and medications have been entered into the medical record, reviewed, and no changes needed.   Review of Systems: No fevers, chills, night sweats, weight loss, chest pain, or shortness of breath.   Objective:    General: Well Developed, well nourished, and in no acute distress.  Neuro: Alert and oriented x3, extra-ocular muscles intact, sensation grossly intact.  HEENT: Normocephalic, atraumatic, pupils equal round reactive to light, neck supple, no masses, no lymphadenopathy, thyroid nonpalpable.  Skin: Warm and dry, no rashes. Cardiac: Regular rate and rhythm, no murmurs rubs or gallops, no lower extremity edema.  Respiratory: Clear to auscultation bilaterally. Not using accessory muscles, speaking in full sentences. Right Shoulder: Inspection reveals no abnormalities, atrophy or asymmetry. Palpation is normal with no tenderness over AC joint or bicipital groove. ROM is full in all planes. Rotator cuff strength normal throughout. Positive Neer and Hawkin's tests, empty can sign. Speeds and Yergason's tests normal. No labral pathology noted with negative Obrien's, negative clunk and good stability. She did have a positive crank test. Normal scapular function observed. No painful arc and no drop arm sign. No apprehension sign  Procedure:  Real-time Ultrasound Guided Injection of right subacromial bursa Device: GE Logiq E  Verbal informed consent obtained.  Time-out conducted.  Noted no overlying erythema, induration, or other signs of local infection.  Skin prepped in a sterile fashion.  Local anesthesia: Topical Ethyl chloride.  With sterile technique and under real time ultrasound guidance:  1 cc Kenalog 40, 4 cc lidocaine injected easily. Completed without difficulty  Pain immediately resolved suggesting accurate placement of the medication.  Advised to call if fevers/chills, erythema, induration, drainage, or persistent bleeding.  Images permanently stored and available for review in the ultrasound unit.  Impression: Technically successful ultrasound guided injection.  Procedure: Real-time Ultrasound Guided Injection of left femoral acetabular joint Device: GE Logiq E  Verbal informed consent obtained.  Time-out conducted.  Noted no overlying erythema, induration, or other signs of local infection.  Skin prepped in a sterile fashion.  Local anesthesia: Topical Ethyl chloride.  With sterile technique and under real time ultrasound guidance:  Spinal needle advanced to the femoral head/neck junction, 1 cc Kenalog 40, 4 cc lidocaine injected easily. Completed without difficulty  Pain immediately resolved suggesting accurate placement of the medication.  Advised to call if fevers/chills, erythema, induration, drainage, or persistent bleeding.  Images permanently stored and available for review in the ultrasound unit.  Impression: Technically successful ultrasound guided injection.  Impression and Recommendations:

## 2014-01-17 NOTE — Assessment & Plan Note (Signed)
Left femoral acetabular joint injection as above. Return as needed. She was getting some right-sided trochanteric bursitis, she declines injection today. I think this is appropriate.

## 2014-01-17 NOTE — Assessment & Plan Note (Signed)
With multilevel disc herniations at the L2-L3, L3-L4, and L4-L5 levels. She had an excellent response to the lower lumbar radicular symptoms with L4-L5 interlaminar epidural. She is noting some upper and lower lumbar symptoms. We are going to proceed with a left-sided L4-L5 and L2-L3 interlaminar epidural.

## 2014-01-22 ENCOUNTER — Ambulatory Visit
Admission: RE | Admit: 2014-01-22 | Discharge: 2014-01-22 | Disposition: A | Discharge status: Home / Self Care | Payer: Medicare Other | Source: Ambulatory Visit | Attending: Sports Medicine | Admitting: Sports Medicine

## 2014-01-22 ENCOUNTER — Other Ambulatory Visit: Payer: Self-pay | Admitting: Physician Assistant

## 2014-01-22 VITALS — BP 168/68 | HR 80

## 2014-01-22 DIAGNOSIS — M5136 Other intervertebral disc degeneration, lumbar region: Secondary | ICD-10-CM

## 2014-01-22 DIAGNOSIS — M545 Low back pain, unspecified: Secondary | ICD-10-CM | POA: Diagnosis not present

## 2014-01-22 MED ORDER — METHYLPREDNISOLONE ACETATE 40 MG/ML INJ SUSP (RADIOLOG
120.0000 mg | Freq: Once | INTRAMUSCULAR | Status: AC
  Administered 2014-01-22: 120 mg via EPIDURAL

## 2014-01-22 MED ORDER — IOHEXOL 180 MG/ML  SOLN
1.0000 mL | Freq: Once | INTRAMUSCULAR | Status: AC | PRN
  Administered 2014-01-22: 1 mL via EPIDURAL

## 2014-01-24 DIAGNOSIS — R894 Abnormal immunological findings in specimens from other organs, systems and tissues: Secondary | ICD-10-CM | POA: Diagnosis not present

## 2014-01-24 DIAGNOSIS — N3000 Acute cystitis without hematuria: Secondary | ICD-10-CM | POA: Diagnosis not present

## 2014-01-24 DIAGNOSIS — R768 Other specified abnormal immunological findings in serum: Secondary | ICD-10-CM | POA: Insufficient documentation

## 2014-01-27 ENCOUNTER — Ambulatory Visit (INDEPENDENT_AMBULATORY_CARE_PROVIDER_SITE_OTHER): Payer: Medicare Other | Admitting: Physical Therapy

## 2014-01-27 DIAGNOSIS — M6281 Muscle weakness (generalized): Secondary | ICD-10-CM

## 2014-01-27 DIAGNOSIS — R293 Abnormal posture: Secondary | ICD-10-CM

## 2014-01-27 DIAGNOSIS — M25519 Pain in unspecified shoulder: Secondary | ICD-10-CM

## 2014-01-29 ENCOUNTER — Encounter (INDEPENDENT_AMBULATORY_CARE_PROVIDER_SITE_OTHER): Payer: Medicare Other | Admitting: Physical Therapy

## 2014-01-29 DIAGNOSIS — M6281 Muscle weakness (generalized): Secondary | ICD-10-CM

## 2014-01-29 DIAGNOSIS — R293 Abnormal posture: Secondary | ICD-10-CM

## 2014-01-29 DIAGNOSIS — M25519 Pain in unspecified shoulder: Secondary | ICD-10-CM

## 2014-01-31 ENCOUNTER — Ambulatory Visit: Payer: Medicare Other | Admitting: Sports Medicine

## 2014-02-03 ENCOUNTER — Encounter (INDEPENDENT_AMBULATORY_CARE_PROVIDER_SITE_OTHER): Payer: Medicare Other | Admitting: Physical Therapy

## 2014-02-03 DIAGNOSIS — M25519 Pain in unspecified shoulder: Secondary | ICD-10-CM

## 2014-02-03 DIAGNOSIS — R269 Unspecified abnormalities of gait and mobility: Secondary | ICD-10-CM

## 2014-02-03 DIAGNOSIS — M6281 Muscle weakness (generalized): Secondary | ICD-10-CM

## 2014-02-05 ENCOUNTER — Encounter (INDEPENDENT_AMBULATORY_CARE_PROVIDER_SITE_OTHER): Payer: Medicare Other | Admitting: Physical Therapy

## 2014-02-05 DIAGNOSIS — M25519 Pain in unspecified shoulder: Secondary | ICD-10-CM

## 2014-02-05 DIAGNOSIS — M6281 Muscle weakness (generalized): Secondary | ICD-10-CM

## 2014-02-05 DIAGNOSIS — R293 Abnormal posture: Secondary | ICD-10-CM

## 2014-02-10 ENCOUNTER — Encounter (INDEPENDENT_AMBULATORY_CARE_PROVIDER_SITE_OTHER): Payer: Medicare Other | Admitting: Physical Therapy

## 2014-02-10 DIAGNOSIS — R293 Abnormal posture: Secondary | ICD-10-CM

## 2014-02-10 DIAGNOSIS — M25519 Pain in unspecified shoulder: Secondary | ICD-10-CM

## 2014-02-10 DIAGNOSIS — M6281 Muscle weakness (generalized): Secondary | ICD-10-CM

## 2014-02-12 ENCOUNTER — Encounter (INDEPENDENT_AMBULATORY_CARE_PROVIDER_SITE_OTHER): Payer: Medicare Other

## 2014-02-12 DIAGNOSIS — R293 Abnormal posture: Secondary | ICD-10-CM

## 2014-02-12 DIAGNOSIS — M25519 Pain in unspecified shoulder: Secondary | ICD-10-CM

## 2014-02-12 DIAGNOSIS — M6281 Muscle weakness (generalized): Secondary | ICD-10-CM

## 2014-02-14 ENCOUNTER — Encounter: Payer: Self-pay | Admitting: Sports Medicine

## 2014-02-14 ENCOUNTER — Ambulatory Visit (INDEPENDENT_AMBULATORY_CARE_PROVIDER_SITE_OTHER): Payer: Medicare Other | Admitting: Sports Medicine

## 2014-02-14 VITALS — BP 142/67 | HR 76 | Ht 66.0 in | Wt 209.0 lb

## 2014-02-14 DIAGNOSIS — M51379 Other intervertebral disc degeneration, lumbosacral region without mention of lumbar back pain or lower extremity pain: Secondary | ICD-10-CM | POA: Diagnosis not present

## 2014-02-14 DIAGNOSIS — M329 Systemic lupus erythematosus, unspecified: Secondary | ICD-10-CM | POA: Diagnosis not present

## 2014-02-14 DIAGNOSIS — M5136 Other intervertebral disc degeneration, lumbar region: Secondary | ICD-10-CM

## 2014-02-14 DIAGNOSIS — M5137 Other intervertebral disc degeneration, lumbosacral region: Secondary | ICD-10-CM

## 2014-02-14 DIAGNOSIS — M160 Bilateral primary osteoarthritis of hip: Secondary | ICD-10-CM

## 2014-02-14 DIAGNOSIS — M1610 Unilateral primary osteoarthritis, unspecified hip: Secondary | ICD-10-CM

## 2014-02-14 DIAGNOSIS — M51369 Other intervertebral disc degeneration, lumbar region without mention of lumbar back pain or lower extremity pain: Secondary | ICD-10-CM

## 2014-02-14 DIAGNOSIS — M169 Osteoarthritis of hip, unspecified: Secondary | ICD-10-CM

## 2014-02-14 DIAGNOSIS — M25519 Pain in unspecified shoulder: Secondary | ICD-10-CM | POA: Diagnosis not present

## 2014-02-14 DIAGNOSIS — M25511 Pain in right shoulder: Secondary | ICD-10-CM

## 2014-02-14 NOTE — Progress Notes (Signed)
  Subjective:    CC: Followup  HPI: Right shoulder pain: Subacromial injection at the last visit, currently going through physical therapy and doing well.  Lumbar degenerative disease: Interventional radiology was unable to access the L2-L3 interspace, they were able to access the L4-L5 interspace, this resulted in complete pain relief.  Hip osteoarthritis resolved after recent injection.  Low back pain: Left-sided, patient localized over the SI joint, moderate, persistent without radiation, worse with weightbearing.  Past medical history, Surgical history, Family history not pertinant except as noted below, Social history, Allergies, and medications have been entered into the medical record, reviewed, and no changes needed.   Review of Systems: No fevers, chills, night sweats, weight loss, chest pain, or shortness of breath.   Objective:    General: Well Developed, well nourished, and in no acute distress.  Neuro: Alert and oriented x3, extra-ocular muscles intact, sensation grossly intact.  HEENT: Normocephalic, atraumatic, pupils equal round reactive to light, neck supple, no masses, no lymphadenopathy, thyroid nonpalpable.  Skin: Warm and dry, no rashes. Cardiac: Regular rate and rhythm, no murmurs rubs or gallops, no lower extremity edema.  Respiratory: Clear to auscultation bilaterally. Not using accessory muscles, speaking in full sentences. Back Exam:  Inspection: Unremarkable  Motion: Flexion 45 deg, Extension 45 deg, Side Bending to 45 deg bilaterally,  Rotation to 45 deg bilaterally  SLR laying: Negative  XSLR laying: Negative  Palpable tenderness: Left SI joint. FABER: negative. Sensory change: Gross sensation intact to all lumbar and sacral dermatomes.  Reflexes: 2+ at both patellar tendons, 2+ at achilles tendons, Babinski's downgoing.  Strength at foot  Plantar-flexion: 5/5 Dorsi-flexion: 5/5 Eversion: 5/5 Inversion: 5/5  Leg strength  Quad: 5/5 Hamstring: 5/5 Hip  flexor: 5/5 Hip abductors: 5/5  Gait unremarkable.  Procedure: Real-time Ultrasound Guided Injection of left SI joint Device: GE Logiq E  Verbal informed consent obtained.  Time-out conducted.  Noted no overlying erythema, induration, or other signs of local infection.  Skin prepped in a sterile fashion.  Local anesthesia: Topical Ethyl chloride.  With sterile technique and under real time ultrasound guidance:  Spinal needle advanced into the joint, 1 cc Kenalog 40, 2 cc Marcaine, 2 cc lidocaine injected easily Completed without difficulty  Pain immediately resolved suggesting accurate placement of the medication.  Advised to call if fevers/chills, erythema, induration, drainage, or persistent bleeding.  Images permanently stored and available for review in the ultrasound unit.  Impression: Technically successful ultrasound guided injection.  Impression and Recommendations:

## 2014-02-14 NOTE — Assessment & Plan Note (Signed)
Interventional radiology was unable to access the L2-L3 interspace, however the L4-L5 interlaminar epidural was effective at controlling both the mid and lower back pain. She did have some pain today referral to the left sacroiliac joint which was injected under guidance, pain did improve. Return in one month. Further epidurals can performed at the L4-L5 level since this resolved all of her axial pain.

## 2014-02-14 NOTE — Assessment & Plan Note (Signed)
Extremely high ANA titer at [facility] rheumatology but negative double-stranded DNA suggesting that this is not actual lupus but falls in the gray area between normal and autoimmune disease. She will follow with him, certainly DMARD's can still be used.

## 2014-02-14 NOTE — Assessment & Plan Note (Signed)
Doing well after subacromial injection last month, over did it a little bit in physical therapy but overall doing fantastic.

## 2014-02-14 NOTE — Assessment & Plan Note (Signed)
Femoral acetabular joint pain is resolved after injection.

## 2014-02-18 ENCOUNTER — Other Ambulatory Visit: Payer: Self-pay | Admitting: Family Medicine

## 2014-02-18 ENCOUNTER — Ambulatory Visit: Payer: Medicare Other

## 2014-02-18 DIAGNOSIS — Z1231 Encounter for screening mammogram for malignant neoplasm of breast: Secondary | ICD-10-CM

## 2014-02-19 ENCOUNTER — Encounter (INDEPENDENT_AMBULATORY_CARE_PROVIDER_SITE_OTHER): Payer: Medicare Other

## 2014-02-19 DIAGNOSIS — R293 Abnormal posture: Secondary | ICD-10-CM

## 2014-02-19 DIAGNOSIS — M25519 Pain in unspecified shoulder: Secondary | ICD-10-CM

## 2014-02-19 DIAGNOSIS — M6281 Muscle weakness (generalized): Secondary | ICD-10-CM

## 2014-02-25 ENCOUNTER — Emergency Department (INDEPENDENT_AMBULATORY_CARE_PROVIDER_SITE_OTHER): Payer: Medicare Other

## 2014-02-25 ENCOUNTER — Encounter: Payer: Medicare Other | Admitting: Physical Therapy

## 2014-02-25 ENCOUNTER — Emergency Department: Payer: Medicare Other

## 2014-02-25 ENCOUNTER — Emergency Department (INDEPENDENT_AMBULATORY_CARE_PROVIDER_SITE_OTHER)
Admission: EM | Admit: 2014-02-25 | Discharge: 2014-02-25 | Disposition: A | Discharge status: Home / Self Care | Payer: Medicare Other | Source: Home / Self Care | Attending: Emergency Medicine | Admitting: Emergency Medicine

## 2014-02-25 ENCOUNTER — Ambulatory Visit: Payer: Medicare Other

## 2014-02-25 ENCOUNTER — Encounter: Payer: Self-pay | Admitting: Emergency Medicine

## 2014-02-25 DIAGNOSIS — L02419 Cutaneous abscess of limb, unspecified: Secondary | ICD-10-CM

## 2014-02-25 DIAGNOSIS — Z1231 Encounter for screening mammogram for malignant neoplasm of breast: Secondary | ICD-10-CM

## 2014-02-25 DIAGNOSIS — M79609 Pain in unspecified limb: Secondary | ICD-10-CM | POA: Diagnosis not present

## 2014-02-25 DIAGNOSIS — L03119 Cellulitis of unspecified part of limb: Secondary | ICD-10-CM

## 2014-02-25 DIAGNOSIS — M79604 Pain in right leg: Secondary | ICD-10-CM

## 2014-02-25 DIAGNOSIS — L03115 Cellulitis of right lower limb: Secondary | ICD-10-CM

## 2014-02-25 LAB — POCT CBC W AUTO DIFF (K'VILLE URGENT CARE)

## 2014-02-25 MED ORDER — DOXYCYCLINE HYCLATE 100 MG PO CAPS: 100.0000 mg | ORAL_CAPSULE | Freq: Two times a day (BID) | ORAL | Status: DC

## 2014-02-25 NOTE — ED Provider Notes (Addendum)
CSN: [id number]     Arrival date & time 02/25/14  1216 History   First MD Initiated Contact with Patient 02/25/14 1235     Chief Complaint  Patient presents with  . Leg Pain    HPI Bilateral lower extremity pain redness, warmth and swelling for 2 days, right leg much worse than the left. Symptoms are moderate, but worsening . Recalls no trauma. Associated with feeling hot and cold spells but no documented fever. She feels that she's gained some weight and has some facial swelling the past 10 days or so. She had some mouth sores that are feeling better. Denies any cardiorespiratory symptoms. Has minimal nausea, without vomiting or diarrhea or change of bowel habits. Denies abdominal pain Denies tick bite or foreign travel.  Past medical history: Followed by Dr. Dianah Field. Has been seeing her for various musculoskeletal problems, and she's had frequent corticosteroid injections, this past year, which have helped musculoskeletal symptoms, last corticosteroid injection 02/14/2014. PCP is Dr Madilyn Fireman.  Earlier this year, was evaluated at [facility] rheumatology, found to have very high ANA titer, but negative double-stranded DNA suggesting that this is not actual lupus but falls in the gray area between normal and autoimmune disease.--Next followup appointment at [facility] rheumatology is June 2015.  I researched some of her labs done by Dr. Dianah Field. TSH and other thyroid functions within normal limits 05/2012. CMP was within normal limits 11/2012.  Past Medical History  Diagnosis Date  . Squamous cell carcinoma    Past Surgical History  Procedure Laterality Date  . Nasal sinus surgery    . Laparoscopic cholecystectomy    . Breast surgery      biopsies/ aspirations - neg  . Dilation and curettage of uterus    . Varicose vein surgery    . Cholecystectomy     Family History  Problem Relation Age of Onset  . Heart disease Mother   . Heart attack Mother   . Hyperlipidemia Father   . Other  Father     amputaiton  . Hyperlipidemia Brother   . Hypertension Brother    History  Substance Use Topics  . Smoking status: Former Smoker    Types: Cigarettes    Quit date: 11/22/1987  . Smokeless tobacco: Never Used  . Alcohol Use: No   OB History   Grav Para Term Preterm Abortions TAB SAB Ect Mult Living                 Review of Systems  Constitutional: Positive for fever (possibly, but not documented), fatigue and unexpected weight change (Weight gain).  HENT: Negative for congestion, rhinorrhea and sore throat.   Respiratory: Negative for cough, chest tightness, shortness of breath and wheezing.   Cardiovascular: Negative for chest pain and palpitations.  Gastrointestinal: Positive for nausea. Negative for vomiting, abdominal pain, diarrhea, blood in stool and abdominal distention.  Genitourinary: Negative.   Neurological: Negative for seizures, syncope and numbness.  Hematological: Negative.   Psychiatric/Behavioral: Negative.   All other systems reviewed and are negative.    Allergies  Bactrim  Home Medications   Current Outpatient Rx  Name  Route  Sig  Dispense  Refill  . amLODipine (NORVASC) 5 MG tablet      TAKE 1 TABLET DAILY   90 tablet   1   . doxycycline (VIBRAMYCIN) 100 MG capsule   Oral   Take 1 capsule (100 mg total) by mouth 2 (two) times daily.   20 capsule   0   .  ibuprofen (ADVIL,MOTRIN) 800 MG tablet   Oral   Take 1 tablet (800 mg total) by mouth every 8 (eight) hours as needed.   60 tablet   2   . traZODone (DESYREL) 50 MG tablet   Oral   Take 1 tablet (50 mg total) by mouth at bedtime.   90 tablet   3    BP 150/83  Pulse 70  Temp(Src) 97.9 F (36.6 C) (Oral)  Resp 18  Ht 5\' 6"  (1.676 m)  Wt 220 lb (99.791 kg)  BMI 35.53 kg/m2  SpO2 96% Physical Exam  Nursing note and vitals reviewed. Constitutional: She is oriented to person, place, and time. She appears well-developed and well-nourished. No distress.  Alert,  fatigued female, uncomfortable from bilateral leg swelling. No acute cardiorespiratory distress  HENT:  Head: Normocephalic and atraumatic. Head is without raccoon's eyes, without Battle's sign, without right periorbital erythema and without left periorbital erythema.  Nose: Nose normal.  HEENT negative except for tiny healing ulceration, 1 in the right lateral and one in the left lateral oral buccal mucosa area  Eyes: Conjunctivae and EOM are normal. Pupils are equal, round, and reactive to light. Right conjunctiva has no hemorrhage. Left conjunctiva has no hemorrhage. No scleral icterus.  Neck: Normal range of motion. No JVD present.  Cardiovascular: Normal rate and regular rhythm.  Exam reveals no gallop and no friction rub.   No murmur heard. Pulmonary/Chest: Effort normal and breath sounds normal.  Abdominal: Soft. Bowel sounds are normal. She exhibits no distension and no mass. There is no tenderness. There is no guarding.  Musculoskeletal: Normal range of motion.  Right lower extremity: Very red, warm, swollen, tender skin and soft tissues anterior medial calf. Homans sign in my opinion is negative, but equivocal. No definite cords felt. No red streaks . Skin overlying this is very red, warm, with diffuse scattered red maculopapular eruption, and she states that maculopapular eruption has been chronic for months or longer.  Left lower extremity: Mildly red, warm, swollen, tender skin and soft tissues anterior medial calf. Homans sign in my opinion is negative. No cords. No red streaks. Skin overlying this is mildly red, warm, with diffuse scattered red maculopapular eruption, and she states that maculopapular eruption has been chronic for months or longer.   Lymphadenopathy:    She has no cervical adenopathy.  Neurological: She is alert and oriented to person, place, and time. No cranial nerve deficit.  Skin: Skin is warm. Rash noted. She is not diaphoretic.  Psychiatric: She has a normal  mood and affect.    ED Course  Procedures (including critical care time) Labs Review Labs Reviewed  POCT CBC W AUTO DIFF ([facility])   CBC within normal limits: WBC 7.0, normal differential Hemoglobin 14.5. Platelets normal 185,000  Imaging Review US Venous Img Lower Unilateral Right  02/25/2014   CLINICAL DATA:  LEG PAIN  EXAM: Right LOWER EXTREMITY VENOUS DOPPLER ULTRASOUND  TECHNIQUE: Gray-scale sonography with graded compression, as well as color Doppler and duplex ultrasound, were performed to evaluate the deep venous system from the level of the common femoral vein through the popliteal and proximal calf veins. Spectral Doppler was utilized to evaluate flow at rest and with distal augmentation maneuvers.  COMPARISON:  None.  FINDINGS: Thrombus within deep veins:  None visualized.  Compressibility of deep veins:  Normal.  Duplex waveform respiratory phasicity:  Normal.  Duplex waveform response to augmentation:  Normal.  Venous reflux:  None visualized.  Other findings:  None visualized.  IMPRESSION: No evidence of a deep venous thrombus within the right lower extremity.   Electronically Signed   By: Margaree Mackintosh M.D.   On: 02/25/2014 13:20     MDM   1. Cellulitis of leg, right   2. Right leg pain   CBC within normal limits: WBC 7.0, normal differential Hemoglobin 14.5. Platelets normal 185,000  1:00 PM. Workup options discussed. In my opinion, we need to rule out right leg DVT and or abscess. After risks, benefits, alternatives discussed, patient agreed with ordering stat ultrasound right leg. 1:30 PM- results: No evidence of DVT or other abnormalities. Discussed with patient  Treatment options discussed, as well as risks, benefits, alternatives. Patient voiced understanding and agreement with the following plans: Doxycycline 100 mg twice a day x10 days She declined any prescription pain medication and prefers to use ibuprofen OTC Keep legs elevated. Other symptomatic  care discussed. Follow-up with Dr Madilyn Fireman( your primary care doctor) in 3-5 days if not improving, or sooner if symptoms become worse. Advised her to discuss with Dr. Madilyn Fireman options of further workup and/or referral back to rheumatologist. She has an appointment at [facility] rheumatology in June, and I suggested to see rheumatologist sooner. Precautions discussed. Red flags discussed. Questions invited and answered. Patient voiced understanding and agreement.  Jacqulyn Cane, MD 02/25/14 1404  Jacqulyn Cane, MD 02/25/14 [phone]

## 2014-02-25 NOTE — ED Notes (Signed)
Pt c/o bilateral LE pain, redness and swelling x 2 days. Pt reports having hot and cold spells without fever. She also reports facial swelling x 10 days.

## 2014-02-27 ENCOUNTER — Ambulatory Visit (INDEPENDENT_AMBULATORY_CARE_PROVIDER_SITE_OTHER): Payer: Medicare Other | Admitting: Family Medicine

## 2014-02-27 ENCOUNTER — Encounter: Payer: Self-pay | Admitting: Family Medicine

## 2014-02-27 VITALS — BP 144/80 | HR 82 | Ht 66.0 in | Wt 221.0 lb

## 2014-02-27 DIAGNOSIS — L03115 Cellulitis of right lower limb: Secondary | ICD-10-CM

## 2014-02-27 DIAGNOSIS — E785 Hyperlipidemia, unspecified: Secondary | ICD-10-CM

## 2014-02-27 DIAGNOSIS — L03119 Cellulitis of unspecified part of limb: Secondary | ICD-10-CM

## 2014-02-27 DIAGNOSIS — Q828 Other specified congenital malformations of skin: Secondary | ICD-10-CM | POA: Diagnosis not present

## 2014-02-27 DIAGNOSIS — L565 Disseminated superficial actinic porokeratosis (DSAP): Secondary | ICD-10-CM | POA: Insufficient documentation

## 2014-02-27 DIAGNOSIS — L02419 Cutaneous abscess of limb, unspecified: Secondary | ICD-10-CM

## 2014-02-27 DIAGNOSIS — R609 Edema, unspecified: Secondary | ICD-10-CM | POA: Diagnosis not present

## 2014-02-27 MED ORDER — ATORVASTATIN CALCIUM 20 MG PO TABS: 20.0000 mg | ORAL_TABLET | Freq: Every day | ORAL | Status: DC

## 2014-02-27 NOTE — Progress Notes (Signed)
   Subjective:    Patient ID: Katherine Nixon, female    DOB: 1946/08/08, 68 y.o.   MRN: [id number]  HPI  Bilt leg swelling x 4 days - bilateral edema she reports that the R leg is worse started on sunday she was seen UC on tuesday they did labs and [place] (neg for DVT) and gave RX for Doxy.Night before noticed swelling felt nauseated and didn't fell well.  Says the swelling and heat are a little better today.    Edema hyperlipidemia-she was having a lot of muscle cramping and we have recommended that she stop her statin for 2-3 months to see if it resolved. Her cramping is much better off the statin. Added pravastatin to her intolerance list.  No CP or SOB.  Review of Systems     Objective:   Physical Exam  Constitutional: She appears well-developed and well-nourished.  HENT:  Head: Normocephalic and atraumatic.  Musculoskeletal:       Legs: Skin: Skin is warm and dry.  Psychiatric: She has a normal mood and affect. Her behavior is normal.    The area outlined is erythematous and edematous petechial-looking with fine erythematous pinpoint. It is diffuse over the area. Slight increased warmth to touch. Trace swelling around the ankles bilaterally. The affected areas much smaller on the left. She also has multiple lesions on the lower extremities consistent with her actinic porokeratosis. She does have a large crack on the right heel but does not appear to be erythematous tender or an open wound.      Assessment & Plan:  Ankle erythema/edema-cellulitis versus inflammatory rash.. Continue doxycycline. Recommend compression  At least on the left ankle with ACE wrap. It doesn't look confluent like a typical cellulitis. It's very petechial but does have some slight increased warmth to touch. Recommend complete the full course of antibiotic and use compression to help with the swelling. If not completely resolving and please let me know. Or if the rash itself resolves but she is still having  swelling and consider looking at the amlodipine and whether not we need to adjust her regimen.   Hyperlipidemia-we'll try a different statin we'll send over to her pharmacy and see how well she does with that.

## 2014-03-06 ENCOUNTER — Encounter (INDEPENDENT_AMBULATORY_CARE_PROVIDER_SITE_OTHER): Payer: Medicare Other

## 2014-03-06 DIAGNOSIS — M25519 Pain in unspecified shoulder: Secondary | ICD-10-CM

## 2014-03-06 DIAGNOSIS — R293 Abnormal posture: Secondary | ICD-10-CM

## 2014-03-06 DIAGNOSIS — M6281 Muscle weakness (generalized): Secondary | ICD-10-CM

## 2014-03-10 ENCOUNTER — Telehealth: Payer: Self-pay | Admitting: *Deleted

## 2014-03-10 MED ORDER — HYDROCHLOROTHIAZIDE 25 MG PO TABS: 12.5000 mg | ORAL_TABLET | Freq: Every day | ORAL | Status: DC | PRN

## 2014-03-10 NOTE — Telephone Encounter (Signed)
Called and informed pt via vm.Katherine Nixon

## 2014-03-10 NOTE — Telephone Encounter (Signed)
Pt called and stated that her swelling is not any better and would like the diuretic that Dr. Madilyn Fireman suggested to be sent to [facility] on [address].Katherine Nixon L [name]

## 2014-03-10 NOTE — Telephone Encounter (Signed)
Sent rx.

## 2014-03-14 ENCOUNTER — Ambulatory Visit (INDEPENDENT_AMBULATORY_CARE_PROVIDER_SITE_OTHER): Payer: Medicare Other | Admitting: Sports Medicine

## 2014-03-14 ENCOUNTER — Encounter: Payer: Self-pay | Admitting: Sports Medicine

## 2014-03-14 VITALS — BP 148/77 | HR 91 | Ht 66.0 in | Wt 219.0 lb

## 2014-03-14 DIAGNOSIS — M51379 Other intervertebral disc degeneration, lumbosacral region without mention of lumbar back pain or lower extremity pain: Secondary | ICD-10-CM

## 2014-03-14 DIAGNOSIS — M5137 Other intervertebral disc degeneration, lumbosacral region: Secondary | ICD-10-CM | POA: Diagnosis not present

## 2014-03-14 DIAGNOSIS — M5136 Other intervertebral disc degeneration, lumbar region: Secondary | ICD-10-CM

## 2014-03-14 DIAGNOSIS — M25511 Pain in right shoulder: Secondary | ICD-10-CM

## 2014-03-14 DIAGNOSIS — M25519 Pain in unspecified shoulder: Secondary | ICD-10-CM | POA: Diagnosis not present

## 2014-03-14 DIAGNOSIS — M51369 Other intervertebral disc degeneration, lumbar region without mention of lumbar back pain or lower extremity pain: Secondary | ICD-10-CM

## 2014-03-14 NOTE — Progress Notes (Signed)
  Subjective:    CC: Followup  HPI: Lumbar degenerative disc disease: Continues to do well after an L4-L5 epidural.  SI joint pain: Continues to be resolved after I performed an SI joint injection after last visit.  Right shoulder pain: Improved significantly with physical therapy but continued to have pain which localizes over the a.c. joint.  Past medical history, Surgical history, Family history not pertinant except as noted below, Social history, Allergies, and medications have been entered into the medical record, reviewed, and no changes needed.   Review of Systems: No fevers, chills, night sweats, weight loss, chest pain, or shortness of breath.   Objective:    General: Well Developed, well nourished, and in no acute distress.  Neuro: Alert and oriented x3, extra-ocular muscles intact, sensation grossly intact.  HEENT: Normocephalic, atraumatic, pupils equal round reactive to light, neck supple, no masses, no lymphadenopathy, thyroid nonpalpable.  Skin: Warm and dry, no rashes. Cardiac: Regular rate and rhythm, no murmurs rubs or gallops, no lower extremity edema.  Respiratory: Clear to auscultation bilaterally. Not using accessory muscles, speaking in full sentences. Right Shoulder: Inspection reveals no abnormalities, atrophy or asymmetry. Tender to palpation over the acromioclavicular joint with a positive cross arm sign. ROM is full in all planes. Rotator cuff strength normal throughout. No signs of impingement with negative Neer and Hawkin's tests, empty can sign. Speeds and Yergason's tests normal. No labral pathology noted with negative Obrien's, negative clunk and good stability. Normal scapular function observed. No painful arc and no drop arm sign. No apprehension sign  Procedure: Real-time Ultrasound Guided Injection of right acromioclavicular joint Device: GE Logiq E  Verbal informed consent obtained.  Time-out conducted.  Noted no overlying erythema,  induration, or other signs of local infection.  Skin prepped in a sterile fashion.  Local anesthesia: Topical Ethyl chloride.  With sterile technique and under real time ultrasound guidance:  Noted copious osteoarthritis, 0.5 cc Kenalog 40, 1 cc lidocaine injected easily into the acromioclavicular joint. Completed without difficulty  Pain immediately resolved suggesting accurate placement of the medication.  Advised to call if fevers/chills, erythema, induration, drainage, or persistent bleeding.  Images permanently stored and available for review in the ultrasound unit.  Impression: Technically successful ultrasound guided injection.  Impression and Recommendations:

## 2014-03-14 NOTE — Assessment & Plan Note (Signed)
Persistent localized over the a.c. Joint. This is injected today under guidance. Return in one month.

## 2014-03-14 NOTE — Assessment & Plan Note (Signed)
Excellent response L4-L5 epidural. I performed an SI joint injection at the last visit and her back is now completely pain free. Further injections and intervention can be in the form of an L4-5 epidural plus/minus an SI joint injection.

## 2014-04-11 ENCOUNTER — Encounter: Payer: Self-pay | Admitting: Sports Medicine

## 2014-04-11 ENCOUNTER — Ambulatory Visit (INDEPENDENT_AMBULATORY_CARE_PROVIDER_SITE_OTHER): Payer: Medicare Other | Admitting: Sports Medicine

## 2014-04-11 VITALS — BP 130/65 | HR 80 | Ht 66.0 in | Wt 224.0 lb

## 2014-04-11 DIAGNOSIS — M25511 Pain in right shoulder: Secondary | ICD-10-CM

## 2014-04-11 DIAGNOSIS — M25519 Pain in unspecified shoulder: Secondary | ICD-10-CM

## 2014-04-11 DIAGNOSIS — M5136 Other intervertebral disc degeneration, lumbar region: Secondary | ICD-10-CM

## 2014-04-11 DIAGNOSIS — M51379 Other intervertebral disc degeneration, lumbosacral region without mention of lumbar back pain or lower extremity pain: Secondary | ICD-10-CM

## 2014-04-11 DIAGNOSIS — M5137 Other intervertebral disc degeneration, lumbosacral region: Secondary | ICD-10-CM

## 2014-04-11 DIAGNOSIS — M51369 Other intervertebral disc degeneration, lumbar region without mention of lumbar back pain or lower extremity pain: Secondary | ICD-10-CM

## 2014-04-11 MED ORDER — TRAMADOL HCL 50 MG PO TABS: ORAL_TABLET | ORAL | Status: DC

## 2014-04-11 MED ORDER — GABAPENTIN 300 MG PO CAPS: ORAL_CAPSULE | ORAL | Status: DC

## 2014-04-11 NOTE — Progress Notes (Signed)
  Subjective:    CC: Followup  HPI: This pleasant 68 year old female with lupus, and multiple orthopedic issues returns for followup.  Right shoulder pain: Pain was predominantly referable to the right acromioclavicular joint, as was injected at the last visit and remains pain-free. She still has some pain that she localized over the deltoid but this is minimal.  Low back pain: Excellent response to radicular symptoms with an L4-5 epidural, or axial pain was all completely resolved after an SI joint injection approximately 2 months ago. She understands it is a bit early to do another SI joint injection, and is wondering if there's any medicines she can take in the meantime. Pain is moderate, persistent.  Past medical history, Surgical history, Family history not pertinant except as noted below, Social history, Allergies, and medications have been entered into the medical record, reviewed, and no changes needed.   Review of Systems: No fevers, chills, night sweats, weight loss, chest pain, or shortness of breath.   Objective:    General: Well Developed, well nourished, and in no acute distress.  Neuro: Alert and oriented x3, extra-ocular muscles intact, sensation grossly intact.  HEENT: Normocephalic, atraumatic, pupils equal round reactive to light, neck supple, no masses, no lymphadenopathy, thyroid nonpalpable.  Skin: Warm and dry, no rashes. Cardiac: Regular rate and rhythm, no murmurs rubs or gallops, no lower extremity edema.  Respiratory: Clear to auscultation bilaterally. Not using accessory muscles, speaking in full sentences. Right Shoulder: Inspection reveals no abnormalities, atrophy or asymmetry. Palpation is normal with no tenderness over AC joint or bicipital groove. ROM is full in all planes. Rotator cuff strength normal throughout. No signs of impingement with negative Neer and Hawkin's tests, empty can sign. She did have some pain with the resistant lift off test. Pain  was referred to the deltoid Speeds and Yergason's tests normal. No labral pathology noted with negative Obrien's, negative clunk and good stability. Normal scapular function observed. No painful arc and no drop arm sign. No apprehension sign Low back: Tender to palpation of the right and more severely at the left sacroiliac joint just medial to the posterior superior iliac spine.  Impression and Recommendations:

## 2014-04-11 NOTE — Assessment & Plan Note (Signed)
Did extremely well for solid 2 months after sacroiliac joint injection. She also did well after L4-L5 epidural. Pain today is predominantly sacroiliac, I do think it is a bit early to repeat the injection. I am going to give her some tramadol and gabapentin, return 2 weeks.

## 2014-04-11 NOTE — Assessment & Plan Note (Signed)
Acromial clavicular joint pain is now resolved after injection at the last visit. She still has some pain referable to the rotator cuff, specifically subscapularis. Continue physical therapy, if continues to have pain in 2 weeks we can do a subcoracoid injection.

## 2014-04-17 ENCOUNTER — Telehealth: Payer: Self-pay

## 2014-04-17 NOTE — Telephone Encounter (Signed)
Patient called stated that she was seen on 04/11/2014 for shoulder pain, she stated that she has taken the pain medication and it has not helped. Her pain level is at a 6 -8 she wants to know what she should do at this point. Rhonda Cunningham,CMA

## 2014-04-18 MED ORDER — HYDROCODONE-ACETAMINOPHEN 5-325 MG PO TABS: 1.0000 | ORAL_TABLET | Freq: Three times a day (TID) | ORAL | Status: DC | PRN

## 2014-04-18 NOTE — Telephone Encounter (Signed)
Let's go up to a short course of hydrocodone, prescription box.

## 2014-04-24 ENCOUNTER — Encounter: Payer: Self-pay | Admitting: Sports Medicine

## 2014-04-24 ENCOUNTER — Ambulatory Visit (INDEPENDENT_AMBULATORY_CARE_PROVIDER_SITE_OTHER): Payer: Medicare Other | Admitting: Sports Medicine

## 2014-04-24 VITALS — BP 137/76 | HR 88 | Ht 66.0 in | Wt 220.0 lb

## 2014-04-24 DIAGNOSIS — M51369 Other intervertebral disc degeneration, lumbar region without mention of lumbar back pain or lower extremity pain: Secondary | ICD-10-CM

## 2014-04-24 DIAGNOSIS — M5137 Other intervertebral disc degeneration, lumbosacral region: Secondary | ICD-10-CM | POA: Diagnosis not present

## 2014-04-24 DIAGNOSIS — M5136 Other intervertebral disc degeneration, lumbar region: Secondary | ICD-10-CM

## 2014-04-24 DIAGNOSIS — M51379 Other intervertebral disc degeneration, lumbosacral region without mention of lumbar back pain or lower extremity pain: Secondary | ICD-10-CM | POA: Diagnosis not present

## 2014-04-24 MED ORDER — KETOROLAC TROMETHAMINE 30 MG/ML IJ SOLN
30.0000 mg | Freq: Once | INTRAMUSCULAR | Status: AC
  Administered 2014-04-24: 30 mg via INTRAMUSCULAR

## 2014-04-24 MED ORDER — TRAMADOL HCL 50 MG PO TABS: ORAL_TABLET | ORAL | Status: DC

## 2014-04-24 NOTE — Progress Notes (Signed)
  Subjective:    CC: Followup  HPI: Recurrent back and leg pain: We injected Katherine Nixon sacroiliac joint sometime ago and she had a fantastic response to her axial SI joint pain. She also has lumbar degenerative disease at multiple levels and she has done well with an L4-L5 epidural in the distant past. Unfortunately she is now having a recurrence of pain which she localizes mildly in the low back, not at the SI joint, but with radiation down the left and right leg, left worse than right in an L5 distribution. Symptoms are severe, persistent, no bowel or bladder dysfunction, or trauma, no constitutional symptoms.  Past medical history, Surgical history, Family history not pertinant except as noted below, Social history, Allergies, and medications have been entered into the medical record, reviewed, and no changes needed.   Review of Systems: No fevers, chills, night sweats, weight loss, chest pain, or shortness of breath.   Objective:    General: Well Developed, well nourished, and in no acute distress.  Neuro: Alert and oriented x3, extra-ocular muscles intact, sensation grossly intact.  HEENT: Normocephalic, atraumatic, pupils equal round reactive to light, neck supple, no masses, no lymphadenopathy, thyroid nonpalpable.  Skin: Warm and dry, no rashes. Cardiac: Regular rate and rhythm, no murmurs rubs or gallops, no lower extremity edema.  Respiratory: Clear to auscultation bilaterally. Not using accessory muscles, speaking in full sentences. Back Exam:  Inspection: Unremarkable  Motion: Flexion 45 deg, Extension 45 deg, Side Bending to 45 deg bilaterally,  Rotation to 45 deg bilaterally  SLR laying: Positive with reproduction of back pain and radicular symptoms.  XSLR laying: Negative  Palpable tenderness: None. FABER: negative. Sensory change: Gross sensation intact to all lumbar and sacral dermatomes.  Reflexes: 2+ at both patellar tendons, 2+ at achilles tendons, Babinski's downgoing.    Strength at foot  Plantar-flexion: 5/5 Dorsi-flexion: 5/5 Eversion: 5/5 Inversion: 5/5  Leg strength  Quad: 5/5 Hamstring: 5/5 Hip flexor: 5/5 Hip abductors: 5/5  Gait unremarkable.  I again personally reviewed the lumbar spine MRI which shows multilevel disc protrusion L2-3, L4-5, but also at the L5-S1 level with bilateral foraminal stenosis clearly affecting both exiting L5 nerve roots.  Impression and Recommendations:

## 2014-04-24 NOTE — Assessment & Plan Note (Signed)
Katherine Nixon is in relatively severe pain today. Toradol 30 mg intramuscular. Her SI joint is fairly asymptomatic today, pain is now radiating down the left worse than right leg in an L5 distribution. On further review of her MRI she does have bilateral foraminal stenosis with a fairly moderate sized disc protrusion at the L5-S1 level likely affecting both L5 nerve roots. At this point we are going to proceed with a bilateral L5-S1 transforaminal epidural injection. It also sounds as though she's having some side effects from either the frontal or the gabapentin, she did have some hallucinations and significant the gabapentin is more painful. Discontinue gabapentin continue tramadol.

## 2014-04-30 ENCOUNTER — Other Ambulatory Visit: Payer: Self-pay | Admitting: Sports Medicine

## 2014-04-30 ENCOUNTER — Encounter: Payer: Self-pay | Admitting: Family Medicine

## 2014-04-30 ENCOUNTER — Ambulatory Visit (INDEPENDENT_AMBULATORY_CARE_PROVIDER_SITE_OTHER): Payer: Medicare Other | Admitting: Family Medicine

## 2014-04-30 VITALS — BP 146/78 | HR 94 | Ht 66.0 in | Wt 211.0 lb

## 2014-04-30 DIAGNOSIS — G4700 Insomnia, unspecified: Secondary | ICD-10-CM | POA: Diagnosis not present

## 2014-04-30 DIAGNOSIS — F329 Major depressive disorder, single episode, unspecified: Secondary | ICD-10-CM

## 2014-04-30 DIAGNOSIS — F32A Depression, unspecified: Secondary | ICD-10-CM

## 2014-04-30 DIAGNOSIS — R197 Diarrhea, unspecified: Secondary | ICD-10-CM

## 2014-04-30 NOTE — Progress Notes (Signed)
Subjective:    Patient ID: Katherine Nixon, female    DOB: 01-16-1946, 68 y.o.   MRN: [id number]  HPI 2 weeks ago started feeling nauseated which she thinks may have been by prescription medication. Felt tired and weak.  Was seeing Dr. Darene Lamer for her joints and was started on gabapentin and toradol. Started having hallucinations last week and called the office. Told to stop the gabentin last wed ( 7 days ago). Then next day stopped the tramadol as well.  Didn't wean the medications. Still feeling nauseated.  After stopping the medications she has felt more nauseated and very depressed. She has had major depression beforeSt, And is fearful about coming back. When she's had episodes in the past it has taken her him as to year to get out of the depression. She takes her trazodone at bedtime but not sleeping well the last few weeks.  Feels lethargic and unmotivated.  No abdominal pain or cramping. No vomiting.   Still having mushy stools.  Happening after eating. Not watery.  No blood in the stool.  No one else sick at home . No known rare meat exposure.  No eating out.  No camping or drinking creek.  Does have an appetite.  no worsening or alleviating factors. The stools have not been watery.  Has been feeling down but feels like it may from her pain with her back she is scheduled for an epidural tomorrow this is to follow back up with Dr. Darene Lamer. about 2 weeks after her injection.   She complains of little interest or pleasure in doing things as well as feeling down depressed several days a week. She denies any thoughts of wanting to harm herself. She complains of difficulty with sleep and worrying too much about things including difficulty relaxing and feeling nervous and on edge several days a week.  Review of Systems     Objective:   Physical Exam  Constitutional: She is oriented to person, place, and time. She appears well-developed and well-nourished.  HENT:  Head: Normocephalic and atraumatic.   Cardiovascular: Normal rate, regular rhythm and normal heart sounds.   Pulmonary/Chest: Effort normal and breath sounds normal.  Abdominal: Soft. Bowel sounds are normal. She exhibits no distension and no mass. There is tenderness. There is no rebound and no guarding.  Mild tenderness in the epigastric area  Neurological: She is alert and oriented to person, place, and time.  Skin: Skin is warm and dry.  Psychiatric: She has a normal mood and affect. Her behavior is normal.          Assessment & Plan:  Depression - PHQ- 9 score of  7 and GAD- 7 score of 11.   We discussed different options including starting an SSRI. Especially since she is very fearful of going back into a deep depression. I reminded her that often times takes several weeks these medications to work and we may need to think about starting something early rather than waiting. At this point time she wants to hold off and just see how she does with more time off of her recent medications and with an increasing her trazodone. Followup in one week to make sure that she is seeing some symptom relief.  Insomnia-will increase trazodone 200 mg and see if this is helpful.  Nausea/diarrhea-we reviewed some dietary changes that might be helpful. We'll check a CBC as well as stool culture though I think infection is low on the differential. Certainly if it's a medication  side effect it should be improving as it has been a week off of her gabapentin and tramadol. Make sure you stay well hydrated. We'll also check electrolytes today.  Time spent 30 minutes, greater than 50% time counseling about mood, insomnia and diarrhea.

## 2014-05-01 ENCOUNTER — Ambulatory Visit
Admission: RE | Admit: 2014-05-01 | Discharge: 2014-05-01 | Disposition: A | Discharge status: Home / Self Care | Payer: Medicare Other | Source: Ambulatory Visit | Attending: Sports Medicine | Admitting: Sports Medicine

## 2014-05-01 ENCOUNTER — Other Ambulatory Visit: Payer: Self-pay | Admitting: Sports Medicine

## 2014-05-01 DIAGNOSIS — M5126 Other intervertebral disc displacement, lumbar region: Secondary | ICD-10-CM | POA: Diagnosis not present

## 2014-05-01 DIAGNOSIS — M5136 Other intervertebral disc degeneration, lumbar region: Secondary | ICD-10-CM

## 2014-05-01 DIAGNOSIS — M545 Low back pain, unspecified: Secondary | ICD-10-CM | POA: Diagnosis not present

## 2014-05-01 DIAGNOSIS — IMO0002 Reserved for concepts with insufficient information to code with codable children: Secondary | ICD-10-CM | POA: Diagnosis not present

## 2014-05-01 LAB — CBC WITH DIFFERENTIAL/PLATELET
Basophils Absolute: 0 10*3/uL (ref 0.0–0.1)
Basophils Relative: 0 % (ref 0–1)
EOS ABS: 0.1 10*3/uL (ref 0.0–0.7)
EOS PCT: 1 % (ref 0–5)
HCT: 40.7 % (ref 36.0–46.0)
HEMOGLOBIN: 13.4 g/dL (ref 12.0–15.0)
LYMPHS ABS: 1.2 10*3/uL (ref 0.7–4.0)
Lymphocytes Relative: 15 % (ref 12–46)
MCH: 29.3 pg (ref 26.0–34.0)
MCHC: 32.9 g/dL (ref 30.0–36.0)
MCV: 88.9 fL (ref 78.0–100.0)
MONOS PCT: 9 % (ref 3–12)
Monocytes Absolute: 0.7 10*3/uL (ref 0.1–1.0)
Neutro Abs: 6 10*3/uL (ref 1.7–7.7)
Neutrophils Relative %: 75 % (ref 43–77)
Platelets: 338 10*3/uL (ref 150–400)
RBC: 4.58 MIL/uL (ref 3.87–5.11)
RDW: 14 % (ref 11.5–15.5)
WBC: 8 10*3/uL (ref 4.0–10.5)

## 2014-05-01 LAB — COMPLETE METABOLIC PANEL WITH GFR
ALBUMIN: 4.1 g/dL (ref 3.5–5.2)
ALT: 11 U/L (ref 0–35)
AST: 20 U/L (ref 0–37)
Alkaline Phosphatase: 87 U/L (ref 39–117)
BUN: 8 mg/dL (ref 6–23)
CALCIUM: 10.4 mg/dL (ref 8.4–10.5)
CHLORIDE: 104 meq/L (ref 96–112)
CO2: 28 meq/L (ref 19–32)
Creat: 0.68 mg/dL (ref 0.50–1.10)
GLUCOSE: 138 mg/dL — AB (ref 70–99)
Potassium: 3.6 mEq/L (ref 3.5–5.3)
Sodium: 142 mEq/L (ref 135–145)
Total Bilirubin: 0.6 mg/dL (ref 0.2–1.2)
Total Protein: 7 g/dL (ref 6.0–8.3)

## 2014-05-01 LAB — TSH: TSH: 0.702 u[IU]/mL (ref 0.350–4.500)

## 2014-05-01 MED ORDER — IOHEXOL 180 MG/ML  SOLN
1.0000 mL | Freq: Once | INTRAMUSCULAR | Status: AC | PRN
  Administered 2014-05-01: 1 mL via EPIDURAL

## 2014-05-01 MED ORDER — METHYLPREDNISOLONE ACETATE 40 MG/ML INJ SUSP (RADIOLOG
120.0000 mg | Freq: Once | INTRAMUSCULAR | Status: AC
  Administered 2014-05-01: 120 mg via EPIDURAL

## 2014-05-01 NOTE — Discharge Instructions (Signed)

## 2014-05-01 NOTE — Progress Notes (Signed)
Quick Note:  All labs are normal. ______ 

## 2014-05-02 ENCOUNTER — Ambulatory Visit: Payer: Medicare Other | Admitting: Sports Medicine

## 2014-05-04 LAB — STOOL CULTURE

## 2014-05-08 ENCOUNTER — Encounter: Payer: Self-pay | Admitting: Family Medicine

## 2014-05-08 ENCOUNTER — Ambulatory Visit (INDEPENDENT_AMBULATORY_CARE_PROVIDER_SITE_OTHER): Payer: Medicare Other | Admitting: Family Medicine

## 2014-05-08 ENCOUNTER — Other Ambulatory Visit: Payer: Self-pay | Admitting: Family Medicine

## 2014-05-08 VITALS — BP 146/72 | HR 70 | Ht 66.0 in | Wt 211.0 lb

## 2014-05-08 DIAGNOSIS — F3289 Other specified depressive episodes: Secondary | ICD-10-CM | POA: Diagnosis not present

## 2014-05-08 DIAGNOSIS — G4700 Insomnia, unspecified: Secondary | ICD-10-CM | POA: Diagnosis not present

## 2014-05-08 DIAGNOSIS — F32A Depression, unspecified: Secondary | ICD-10-CM

## 2014-05-08 DIAGNOSIS — R197 Diarrhea, unspecified: Secondary | ICD-10-CM

## 2014-05-08 DIAGNOSIS — F329 Major depressive disorder, single episode, unspecified: Secondary | ICD-10-CM

## 2014-05-08 MED ORDER — AMBULATORY NON FORMULARY MEDICATION
Status: DC
Start: 2014-05-08 — End: 2014-05-26

## 2014-05-08 MED ORDER — TRAZODONE HCL 100 MG PO TABS: 100.0000 mg | ORAL_TABLET | Freq: Every day | ORAL | Status: DC

## 2014-05-08 NOTE — Progress Notes (Signed)
   Subjective:    Patient ID: Katherine Nixon, female    DOB: [birth year], 68 y.o.   MRN: [id number]  HPI Insomnia- She has been getting about 5.5 hours of sleep on the inc trazodone. She is now taking 100 mg. She will need a new perception sent to her from that [place] for the 100 mg tab. No excess sedation during the day.  Depression/anxiety - she is gettting out some.  Some days still feels really down.  Yesterday she felt really down. She is ok with therapy.  She's had some days are still difficult to get motivated in just feels like she is an off pump.  Diarrhea - better but not resolved. Still having some looses stools in the AM. Has had a few formed stools.  . no blood in the stool. No fevers chills or sweats.   Review of Systems     Objective:   Physical Exam  Constitutional: She is oriented to person, place, and time. She appears well-developed and well-nourished.  HENT:  Head: Normocephalic and atraumatic.  Eyes: Conjunctivae and EOM are normal.  Cardiovascular: Normal rate.   Pulmonary/Chest: Effort normal.  Neurological: She is alert and oriented to person, place, and time.  Skin: Skin is dry. No pallor.  Psychiatric: She has a normal mood and affect. Her behavior is normal.          Assessment & Plan:  Depression/anxiety - gad 7 score of 5 today which is much improved from previous. PHQ 9 score of 3 today. She says she feels down depressed and hopeless several days a week and has difficulty with sleep several days a week. Also complains of trouble concentrating.  At this point time will refer for therapy. She's very open to this today. We again discussed whether not she wants to start an antidepressant and she wants to hold off on this for now. Followup in 2 weeks.  Diarrhea- improving but not resolved. Stool cultures were negative. Continue to monitor and continue conservative care. Hopefully the diarrhea will resolve completely in the next 2-3  weeks.  Insomnia - she's doing well on the 100 mg trazodone. Per prescription and sent to her mail-order pharmacy. Also consider increasing 250 mg if she would like. She can certainly do this on her own between now and when I see her back.

## 2014-05-15 ENCOUNTER — Other Ambulatory Visit: Payer: Self-pay | Admitting: *Deleted

## 2014-05-15 ENCOUNTER — Ambulatory Visit (INDEPENDENT_AMBULATORY_CARE_PROVIDER_SITE_OTHER): Payer: Medicare Other | Admitting: Sports Medicine

## 2014-05-15 ENCOUNTER — Encounter: Payer: Self-pay | Admitting: Sports Medicine

## 2014-05-15 VITALS — BP 152/79 | HR 79 | Ht 66.0 in | Wt 212.0 lb

## 2014-05-15 DIAGNOSIS — M5136 Other intervertebral disc degeneration, lumbar region: Secondary | ICD-10-CM

## 2014-05-15 DIAGNOSIS — M1710 Unilateral primary osteoarthritis, unspecified knee: Secondary | ICD-10-CM | POA: Diagnosis not present

## 2014-05-15 DIAGNOSIS — M51379 Other intervertebral disc degeneration, lumbosacral region without mention of lumbar back pain or lower extremity pain: Secondary | ICD-10-CM

## 2014-05-15 DIAGNOSIS — M5137 Other intervertebral disc degeneration, lumbosacral region: Secondary | ICD-10-CM | POA: Diagnosis not present

## 2014-05-15 DIAGNOSIS — M170 Bilateral primary osteoarthritis of knee: Secondary | ICD-10-CM

## 2014-05-15 DIAGNOSIS — M51369 Other intervertebral disc degeneration, lumbar region without mention of lumbar back pain or lower extremity pain: Secondary | ICD-10-CM

## 2014-05-15 MED ORDER — ATORVASTATIN CALCIUM 20 MG PO TABS: 20.0000 mg | ORAL_TABLET | Freq: Every day | ORAL | Status: DC

## 2014-05-15 NOTE — Progress Notes (Signed)
  Subjective:    CC: Followup  HPI: Katherine Nixon returns after her bilateral L5-S1 transforaminal epidural, she had complete relief of her axial back pain and right-sided radicular symptoms, she continues to have some left-sided sacroiliac joint pain with radiation into the buttock, she tells me that this pain was completely resolved after her SI joint injection but understands that she has to wait before getting another one. She also has some pain that she localizes in her left knee, with swelling, radiating into the shin. Moderate, persistent.  Past medical history, Surgical history, Family history not pertinant except as noted below, Social history, Allergies, and medications have been entered into the medical record, reviewed, and no changes needed.   Review of Systems: No fevers, chills, night sweats, weight loss, chest pain, or shortness of breath.   Objective:    General: Well Developed, well nourished, and in no acute distress.  Neuro: Alert and oriented x3, extra-ocular muscles intact, sensation grossly intact.  HEENT: Normocephalic, atraumatic, pupils equal round reactive to light, neck supple, no masses, no lymphadenopathy, thyroid nonpalpable.  Skin: Warm and dry, no rashes. Cardiac: Regular rate and rhythm, no murmurs rubs or gallops, no lower extremity edema.  Respiratory: Clear to auscultation bilaterally. Not using accessory muscles, speaking in full sentences. Last Knee: Visible effusion, palpable fluid wave, tender to palpation at the lateral joint line. ROM full in flexion and extension and lower leg rotation. Ligaments with solid consistent endpoints including ACL, PCL, LCL, MCL. Negative Mcmurray's, Apley's, and Thessalonian tests. Non painful patellar compression. Patellar glide without crepitus. Patellar and quadriceps tendons unremarkable. Hamstring and quadriceps strength is normal.   Procedure: Real-time Ultrasound Guided aspiration/Injection of left knee Device: GE  Logiq E  Verbal informed consent obtained.  Time-out conducted.  Noted no overlying erythema, induration, or other signs of local infection.  Skin prepped in a sterile fashion.  Local anesthesia: Topical Ethyl chloride.  With sterile technique and under real time ultrasound guidance:  17 cc of straw-colored fluid aspirated, syringe switched in 2 cc Kenalog 40, 4 cc lidocaine injected easily. Completed without difficulty  Pain immediately resolved suggesting accurate placement of the medication.  Advised to call if fevers/chills, erythema, induration, drainage, or persistent bleeding.  Images permanently stored and available for review in the ultrasound unit.  Impression: Technically successful ultrasound guided injection.  Impression and Recommendations:

## 2014-05-15 NOTE — Assessment & Plan Note (Signed)
Response to bilateral L5-S1 transforaminal epidural. Now having a recurrence of the SI joint pain. I can reinject her SI joint in one month.

## 2014-05-15 NOTE — Assessment & Plan Note (Signed)
Recurrence of left knee pain, aspiration and injection as above.

## 2014-05-15 NOTE — Telephone Encounter (Signed)
Rx refill sent to [facility]. Margette Fast, CMA

## 2014-05-22 ENCOUNTER — Ambulatory Visit: Payer: Medicare Other | Admitting: Family Medicine

## 2014-05-26 ENCOUNTER — Ambulatory Visit (INDEPENDENT_AMBULATORY_CARE_PROVIDER_SITE_OTHER): Payer: Medicare Other | Admitting: Family Medicine

## 2014-05-26 ENCOUNTER — Encounter: Payer: Self-pay | Admitting: Family Medicine

## 2014-05-26 VITALS — BP 156/69 | HR 75 | Wt 210.0 lb

## 2014-05-26 DIAGNOSIS — F329 Major depressive disorder, single episode, unspecified: Secondary | ICD-10-CM

## 2014-05-26 DIAGNOSIS — F3289 Other specified depressive episodes: Secondary | ICD-10-CM | POA: Diagnosis not present

## 2014-05-26 DIAGNOSIS — G4700 Insomnia, unspecified: Secondary | ICD-10-CM | POA: Diagnosis not present

## 2014-05-26 DIAGNOSIS — I10 Essential (primary) hypertension: Secondary | ICD-10-CM

## 2014-05-26 DIAGNOSIS — F32A Depression, unspecified: Secondary | ICD-10-CM

## 2014-05-26 MED ORDER — TRAZODONE HCL 100 MG PO TABS: 150.0000 mg | ORAL_TABLET | Freq: Every day | ORAL | Status: DC

## 2014-05-26 NOTE — Progress Notes (Signed)
   Subjective:    Patient ID: Katherine Nixon, female    DOB: [birth year], 68 y.o.   MRN: [id number]  HPI Followup depression and anxiety-I last saw her she referral to a therapist. She went to hold off on prescription medication at that time. Still complains of having low energy and not being up to sleep well because of worrying.  inosmnia - Still having a hard time staying asleep.  Did increase the trazodone to 150mg  about 3 days ago.    Hypertension- Pt denies chest pain, SOB, dizziness, or heart palpitations.  Taking meds as directed w/o problems.  Denies medication side effects.     Review of Systems     Objective:   Physical Exam  Constitutional: She is oriented to person, place, and time. She appears well-developed and well-nourished.  HENT:  Head: Normocephalic and atraumatic.  Cardiovascular: Normal rate, regular rhythm and normal heart sounds.   Pulmonary/Chest: Effort normal and breath sounds normal.  Neurological: She is alert and oriented to person, place, and time.  Skin: Skin is warm and dry.  Psychiatric: She has a normal mood and affect. Her behavior is normal.          Assessment & Plan:  Depression/anxiety-gad 7 score of 1 today which is down from previous of 5. PHQ 9 score of 2 today, which is down from previous of 3. Dong well. Does have appt at the end of the month for a thrapist.    Insomnia - Will send trazodone to mail-order.    Hypertension-her blood pressure has not been well controlled the last 4 time she's been here. She was upset and tearful and in pain at this time so did not adjust her regimen. Today she is feeling much better and her blood pressure still elevated. She only uses her hydrochlorothiazide as needed for lower extremity swelling. Encouraged her start taking it daily or at least every other day and I'll see her back in 4-6 weeks to make sure that her blood pressures getting under good control.

## 2014-06-10 ENCOUNTER — Ambulatory Visit (INDEPENDENT_AMBULATORY_CARE_PROVIDER_SITE_OTHER): Payer: Medicare Other | Admitting: Professional Counselor

## 2014-06-10 DIAGNOSIS — F339 Major depressive disorder, recurrent, unspecified: Secondary | ICD-10-CM

## 2014-06-10 NOTE — Progress Notes (Signed)
Patient:   Katherine Nixon   DOB:   10/21/1946  MR Number:  [id number]  Location:  [facility] [address] [place] [address] Dept: [phone]           Date of Service:   06/10/2014   Start Time:   1:00pm End Time:   1:50pm  Provider/Observer:  Kennett Work       Billing Code/Service: [phone]  Chief Complaint:     Chief Complaint  Patient presents with  . Establish Care  . Depression    Reason for Service:  Establish care for therapy due to increase depression from medical issues (chronic pain)  Current Status:  Patient was referred by PCP and is not currently seeing any other mental health providers.  Reliability of Information: PHQ-9 and GAD assessments and results, patient self report  Behavioral Observation: Katherine Nixon  presents as a 68 y.o.-year-old Right Caucasian Female who appeared her stated age. her dress was Appropriate and she was Casual and her manners were Appropriate to the situation.  There were any physical disabilities noted.  she displayed an appropriate level of cooperation and motivation.    Interactions:    Active   Attention:   within normal limits  Memory:   normal  Visuo-spatial:   within normal limits  Speech (Volume):  normal  Speech:   normal pitch and normal volume  Thought Process:  Coherent  Though Content:  WNL  Orientation:   person, place, time/date and situation  Judgment:   Good  Planning:   Good  Affect:    Depressed  Mood:    Depressed  Insight:   Fair  Intelligence:   normal  Marital Status/Living: Patient reports she has been married for 74 years to current husband. She lives in [place] with husband.  Patient has 4 children all living in different areas of the [place].   Reports she has grandchildren and is very close to family.    Current Employment: Patient is retired.  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  Substance Use:  No concerns of substance abuse are reported.  Patient denies any and all drugs and alcohol. No previous reports of SA  Education:   College  Patient had some nursing college per report. Did not finish  Legal:  None  Military:  Patient husband was in the [employer] and patient moved with husband several times throughout their marriage. Patient was never herself part of [employer].  Hobbies:  Patient reports she enjoys gardening, bird watching, scrap booking and making greeting cards   Religion: Patient is actively going to church and involved with church friends for small groups.  Medical History:   Past Medical History  Diagnosis Date  . Squamous cell carcinoma         Outpatient Encounter Prescriptions as of 06/10/2014  Medication Sig  . amLODipine (NORVASC) 5 MG tablet TAKE 1 TABLET DAILY  . atorvastatin (LIPITOR) 20 MG tablet Take 1 tablet (20 mg total) by mouth daily.  . hydrochlorothiazide (HYDRODIURIL) 25 MG tablet TAKE 1 TABLET EVERY DAY AS NEEDED SWELLING  . ibuprofen (ADVIL,MOTRIN) 800 MG tablet Take 800 mg by mouth as needed.  Marland Kitchen ibuprofen (ADVIL,MOTRIN) 800 MG tablet TAKE 1 TABLET (800 MG TOTAL) BY MOUTH EVERY  8 (EIGHT) HOURS AS NEEDED.  Marland Kitchen traZODone (DESYREL) 100 MG tablet Take 1.5 tablets (150 mg total) by mouth at bedtime.          Sexual History:   History  Sexual Activity  . Sexual Activity: No    Abuse/Trauma History: Patient denies any abuse and trauma  Psychiatric History:  Patient reports she was treatment for major depression from 1967- 1998 with a failed suicide attempt where she ingested half a bottle of Asprin. She has been hospitalized in the past (1967) and see several therapist and doctors for medication.  She reports she has a fear of medication currently and is not interested in  referral for Psych MD.  Family Med/Psych History:  Family History  Problem Relation Age of Onset  . Heart disease Mother   . Heart attack Mother   . Hyperlipidemia Father   . Other Father     amputaiton  . Hyperlipidemia Brother   . Hypertension Brother     Risk of Suicide/Violence: low Patient denies any thoughts of suicide or plans. She does give history of her past attempt in 1967.  Safety planning was reviewed with patient in detail specifically with crisis planning  Impression/DX:  Katherine Nixon is a 68 year old female referred from her PCP due to increased depression from chronic pain.  LCSW reviewed PHq- 9 score of 9 for moderate depression and GAD- for moderate anxiety and assessed current stressors.   Patient reports she is also being seen because of her fear of medication and increased worry/anxiety.  She reports she thought she was diagnosis with lupus last year, however test results have been inconclusive and patient is going back this week to [facility] to confirm or look at other reasons for chronic pain. She is managing pain with doctors at [facility] as well as PCP through injections. She is currently not taking anything oral for pain other than tylenol.  Patient reports she has been feeling better lately due to pain control with MD, however reports she does not have to tools to deal with additional episodes if pain comes back like it did a few weeks back.  She is interested in learning new ways of coping with pain, exercise plans to help alleviate pain and control worry.  Patient is very invested in treatment and motivation to change thinking to see if that helps with pain control as she feels a lot of it is psychological.   She reports she is not interested in medication due to all negative side effects from the past.   Current impression of patient after assessment is major depressive disorder recurrent, moderate and Generalized anxiety disorder.   Disposition/Plan:  Patient reports she  is willing to see therapist biweekly for continued therapy. She will be focusing on her fear of medications (increased anxiety) and coping with pain (learning new strategies).  Methods used will be CBT, briefs therapy, and biofeedback.  Patient will also be given education around diet and exercise in efforts to engage in noninvasive activities in efforts to alleviate pain.   Diagnosis:    Axis I:  No diagnosis found.          Axis II: Deferred           Lilly Cove, LCSW

## 2014-06-13 DIAGNOSIS — K117 Disturbances of salivary secretion: Secondary | ICD-10-CM | POA: Diagnosis not present

## 2014-06-13 DIAGNOSIS — M5137 Other intervertebral disc degeneration, lumbosacral region: Secondary | ICD-10-CM | POA: Diagnosis not present

## 2014-06-13 DIAGNOSIS — M1990 Unspecified osteoarthritis, unspecified site: Secondary | ICD-10-CM | POA: Diagnosis not present

## 2014-06-13 DIAGNOSIS — R894 Abnormal immunological findings in specimens from other organs, systems and tissues: Secondary | ICD-10-CM | POA: Diagnosis not present

## 2014-06-13 DIAGNOSIS — M1710 Unilateral primary osteoarthritis, unspecified knee: Secondary | ICD-10-CM | POA: Diagnosis not present

## 2014-06-13 DIAGNOSIS — IMO0002 Reserved for concepts with insufficient information to code with codable children: Secondary | ICD-10-CM | POA: Diagnosis not present

## 2014-06-13 DIAGNOSIS — G479 Sleep disorder, unspecified: Secondary | ICD-10-CM | POA: Diagnosis not present

## 2014-06-15 ENCOUNTER — Other Ambulatory Visit: Payer: Self-pay | Admitting: Physician Assistant

## 2014-06-15 DIAGNOSIS — M1710 Unilateral primary osteoarthritis, unspecified knee: Secondary | ICD-10-CM | POA: Insufficient documentation

## 2014-06-15 DIAGNOSIS — M25512 Pain in left shoulder: Secondary | ICD-10-CM | POA: Insufficient documentation

## 2014-06-15 DIAGNOSIS — M5136 Other intervertebral disc degeneration, lumbar region: Secondary | ICD-10-CM | POA: Insufficient documentation

## 2014-06-15 DIAGNOSIS — M179 Osteoarthritis of knee, unspecified: Secondary | ICD-10-CM | POA: Insufficient documentation

## 2014-06-15 DIAGNOSIS — G479 Sleep disorder, unspecified: Secondary | ICD-10-CM | POA: Insufficient documentation

## 2014-06-15 DIAGNOSIS — R682 Dry mouth, unspecified: Secondary | ICD-10-CM | POA: Insufficient documentation

## 2014-06-19 ENCOUNTER — Ambulatory Visit (INDEPENDENT_AMBULATORY_CARE_PROVIDER_SITE_OTHER): Payer: Medicare Other | Admitting: Sports Medicine

## 2014-06-19 ENCOUNTER — Encounter: Payer: Self-pay | Admitting: Sports Medicine

## 2014-06-19 VITALS — BP 143/68 | HR 86 | Ht 66.0 in | Wt 214.0 lb

## 2014-06-19 DIAGNOSIS — M51379 Other intervertebral disc degeneration, lumbosacral region without mention of lumbar back pain or lower extremity pain: Secondary | ICD-10-CM | POA: Diagnosis not present

## 2014-06-19 DIAGNOSIS — M5137 Other intervertebral disc degeneration, lumbosacral region: Secondary | ICD-10-CM | POA: Diagnosis not present

## 2014-06-19 DIAGNOSIS — M170 Bilateral primary osteoarthritis of knee: Secondary | ICD-10-CM

## 2014-06-19 DIAGNOSIS — M1710 Unilateral primary osteoarthritis, unspecified knee: Secondary | ICD-10-CM

## 2014-06-19 DIAGNOSIS — IMO0001 Reserved for inherently not codable concepts without codable children: Secondary | ICD-10-CM | POA: Diagnosis not present

## 2014-06-19 DIAGNOSIS — M5136 Other intervertebral disc degeneration, lumbar region: Secondary | ICD-10-CM

## 2014-06-19 DIAGNOSIS — M51369 Other intervertebral disc degeneration, lumbar region without mention of lumbar back pain or lower extremity pain: Secondary | ICD-10-CM

## 2014-06-19 NOTE — Assessment & Plan Note (Addendum)
Starting OrthoVisc bilaterally. Return in one week for OrthoVisc injection #2 into both knees. She does also desired custom orthotics, will make a separate appointment for this.

## 2014-06-19 NOTE — Assessment & Plan Note (Signed)
Left sacroiliac joint injection as above.

## 2014-06-19 NOTE — Patient Instructions (Signed)
Followup visit for OrthoVisc injection #2, and a separate visit, 30 minutes, for custom orthotics.

## 2014-06-19 NOTE — Progress Notes (Signed)
  Subjective:    CC: Followup  HPI: SI joint pain: Katherine Nixon's last sacroiliac joint injection was approximately 4 months ago she now has a recurrence of pain and desires a repeat injection.  Bilateral knee osteoarthritis : desires to start viscous supplementation. Pain is at the joint lines, moderate, persistent.  Connective tissue disorder: Seeing a rheumatologist at [facility], she started diclofenac, if no improvement in the next step sounds to be hydroxychloroquine.  Past medical history, Surgical history, Family history not pertinant except as noted below, Social history, Allergies, and medications have been entered into the medical record, reviewed, and no changes needed.   Review of Systems: No fevers, chills, night sweats, weight loss, chest pain, or shortness of breath.   Objective:    General: Well Developed, well nourished, and in no acute distress.  Neuro: Alert and oriented x3, extra-ocular muscles intact, sensation grossly intact.  HEENT: Normocephalic, atraumatic, pupils equal round reactive to light, neck supple, no masses, no lymphadenopathy, thyroid nonpalpable.  Skin: Warm and dry, no rashes. Cardiac: Regular rate and rhythm, no murmurs rubs or gallops, no lower extremity edema.  Respiratory: Clear to auscultation bilaterally. Not using accessory muscles, speaking in full sentences.  Procedure: Real-time Ultrasound Guided Injection of  Left sacroiliac joint Device: GE Logiq E  Verbal informed consent obtained.  Time-out conducted.  Noted no overlying erythema, induration, or other signs of local infection.  Skin prepped in a sterile fashion.  Local anesthesia: Topical Ethyl chloride.  With sterile technique and under real time ultrasound guidance:  Spinal needle advanced over the sacral promontory, it was then advanced into the joint just medial to the posterior superior iliac spine, 1 cc kenalog 40, 4 cc lidocaine injected. Completed without difficulty  Pain immediately  resolved suggesting accurate placement of the medication.  Advised to call if fevers/chills, erythema, induration, drainage, or persistent bleeding.  Images permanently stored and available for review in the ultrasound unit.  Impression: Technically successful ultrasound guided injection.  Procedure: Real-time Ultrasound Guided Injection of left knee Device: GE Logiq E  Verbal informed consent obtained.  Time-out conducted.  Noted no overlying erythema, induration, or other signs of local infection.  Skin prepped in a sterile fashion.  Local anesthesia: Topical Ethyl chloride.  With sterile technique and under real time ultrasound guidance:   30 mg/2 mL of OrthoVisc (sodium hyaluronate) in a prefilled syringe was injected easily into the knee through a 22-gauge needle. Completed without difficulty  Pain immediately resolved suggesting accurate placement of the medication.  Advised to call if fevers/chills, erythema, induration, drainage, or persistent bleeding.  Images permanently stored and available for review in the ultrasound unit.  Impression: Technically successful ultrasound guided injection.  Procedure: Real-time Ultrasound Guided Injection of right knee Device: GE Logiq E  Verbal informed consent obtained.  Time-out conducted.  Noted no overlying erythema, induration, or other signs of local infection.  Skin prepped in a sterile fashion.  Local anesthesia: Topical Ethyl chloride.  With sterile technique and under real time ultrasound guidance:   30 mg/2 mL of OrthoVisc (sodium hyaluronate) in a prefilled syringe was injected easily into the knee through a 22-gauge needle. Completed without difficulty  Pain immediately resolved suggesting accurate placement of the medication.  Advised to call if fevers/chills, erythema, induration, drainage, or persistent bleeding.  Images permanently stored and available for review in the ultrasound unit.  Impression: Technically successful  ultrasound guided injection.  Impression and Recommendations:

## 2014-06-27 ENCOUNTER — Ambulatory Visit (INDEPENDENT_AMBULATORY_CARE_PROVIDER_SITE_OTHER): Payer: Medicare Other | Admitting: Sports Medicine

## 2014-06-27 ENCOUNTER — Encounter: Payer: Self-pay | Admitting: Sports Medicine

## 2014-06-27 VITALS — BP 129/67 | HR 91 | Ht 66.0 in | Wt 214.0 lb

## 2014-06-27 DIAGNOSIS — M5136 Other intervertebral disc degeneration, lumbar region: Secondary | ICD-10-CM

## 2014-06-27 DIAGNOSIS — M1710 Unilateral primary osteoarthritis, unspecified knee: Secondary | ICD-10-CM | POA: Diagnosis not present

## 2014-06-27 DIAGNOSIS — M170 Bilateral primary osteoarthritis of knee: Secondary | ICD-10-CM

## 2014-06-27 DIAGNOSIS — M51369 Other intervertebral disc degeneration, lumbar region without mention of lumbar back pain or lower extremity pain: Secondary | ICD-10-CM

## 2014-06-27 NOTE — Progress Notes (Signed)
  Procedure: Real-time Ultrasound Guided Injection of right knee Device: GE Logiq E  Verbal informed consent obtained.  Time-out conducted.  Noted no overlying erythema, induration, or other signs of local infection.  Skin prepped in a sterile fashion.  Local anesthesia: Topical Ethyl chloride.  With sterile technique and under real time ultrasound guidance:   30 mg/2 mL of OrthoVisc (sodium hyaluronate) in a prefilled syringe was injected easily into the knee through a 22-gauge needle. Completed without difficulty  Pain immediately resolved suggesting accurate placement of the medication.  Advised to call if fevers/chills, erythema, induration, drainage, or persistent bleeding.  Images permanently stored and available for review in the ultrasound unit.  Impression: Technically successful ultrasound guided injection.  Procedure: Real-time Ultrasound Guided Injection of left knee Device: GE Logiq E  Verbal informed consent obtained.  Time-out conducted.  Noted no overlying erythema, induration, or other signs of local infection.  Skin prepped in a sterile fashion.  Local anesthesia: Topical Ethyl chloride.  With sterile technique and under real time ultrasound guidance: 30 cc of straw-colored fluid aspirated syringe switched and 30 mg/2 mL of OrthoVisc (sodium hyaluronate) in a prefilled syringe was injected easily into the knee through a 18-gauge needle.  Completed without difficulty  Pain immediately resolved suggesting accurate placement of the medication.  Advised to call if fevers/chills, erythema, induration, drainage, or persistent bleeding.  Images permanently stored and available for review in the ultrasound unit.  Impression: Technically successful ultrasound guided injection.

## 2014-06-27 NOTE — Assessment & Plan Note (Signed)
SI joint pain resolved after injection last week.

## 2014-06-27 NOTE — Assessment & Plan Note (Signed)
Bilateral OrthoVisc injection #2. Return in one week for #3.

## 2014-06-30 ENCOUNTER — Ambulatory Visit: Payer: Medicare Other | Admitting: Family Medicine

## 2014-07-01 ENCOUNTER — Ambulatory Visit (HOSPITAL_COMMUNITY): Payer: Self-pay | Admitting: Professional Counselor

## 2014-07-04 ENCOUNTER — Ambulatory Visit (INDEPENDENT_AMBULATORY_CARE_PROVIDER_SITE_OTHER): Payer: Medicare Other | Admitting: Sports Medicine

## 2014-07-04 VITALS — BP 137/66 | HR 82 | Ht 66.0 in | Wt 212.0 lb

## 2014-07-04 DIAGNOSIS — M170 Bilateral primary osteoarthritis of knee: Secondary | ICD-10-CM

## 2014-07-04 DIAGNOSIS — M1710 Unilateral primary osteoarthritis, unspecified knee: Secondary | ICD-10-CM | POA: Diagnosis not present

## 2014-07-04 NOTE — Progress Notes (Signed)
  Procedure: Real-time Ultrasound Guided Injection of right knee Device: GE Logiq E  Verbal informed consent obtained.  Time-out conducted.  Noted no overlying erythema, induration, or other signs of local infection.  Skin prepped in a sterile fashion.  Local anesthesia: Topical Ethyl chloride.  With sterile technique and under real time ultrasound guidance:   30 mg/2 mL of OrthoVisc (sodium hyaluronate) in a prefilled syringe was injected easily into the knee through a 22-gauge needle. Completed without difficulty  Pain immediately resolved suggesting accurate placement of the medication.  Advised to call if fevers/chills, erythema, induration, drainage, or persistent bleeding.  Images permanently stored and available for review in the ultrasound unit.  Impression: Technically successful ultrasound guided injection.  Procedure: Real-time Ultrasound Guided Injection of left knee Device: GE Logiq E  Verbal informed consent obtained.  Time-out conducted.  Noted no overlying erythema, induration, or other signs of local infection.  Skin prepped in a sterile fashion.  Local anesthesia: Topical Ethyl chloride.  With sterile technique and under real time ultrasound guidance: 30 cc of slightly cloudy straw-colored fluid aspirated syringe switched and 30 mg/2 mL of OrthoVisc (sodium hyaluronate) in a prefilled syringe was injected easily into the knee through a 18-gauge needle.  Completed without difficulty  Pain immediately resolved suggesting accurate placement of the medication.  Advised to call if fevers/chills, erythema, induration, drainage, or persistent bleeding.  Images permanently stored and available for review in the ultrasound unit.  Impression: Technically successful ultrasound guided injection.

## 2014-07-04 NOTE — Assessment & Plan Note (Signed)
OrthoVisc injection #3 into both knees, I did aspirate the left knee, it was slightly cloudy so we will send off for fluid analysis, cultures, and crystal analysis. Return in one week for OrthoVisc injection #4.

## 2014-07-05 LAB — SYNOVIAL CELL COUNT + DIFF, W/ CRYSTALS
Crystals, Fluid: NONE SEEN
Eosinophils-Synovial: 0 % (ref 0–1)
Lymphocytes-Synovial Fld: 32 % — ABNORMAL HIGH (ref 0–20)
Monocyte/Macrophage: 12 % — ABNORMAL LOW (ref 50–90)
Neutrophil, Synovial: 56 % — ABNORMAL HIGH (ref 0–25)

## 2014-07-08 DIAGNOSIS — L578 Other skin changes due to chronic exposure to nonionizing radiation: Secondary | ICD-10-CM | POA: Diagnosis not present

## 2014-07-08 DIAGNOSIS — L570 Actinic keratosis: Secondary | ICD-10-CM | POA: Diagnosis not present

## 2014-07-08 LAB — BODY FLUID CULTURE
Gram Stain: NONE SEEN
Organism ID, Bacteria: NO GROWTH

## 2014-07-11 ENCOUNTER — Encounter: Payer: Self-pay | Admitting: Sports Medicine

## 2014-07-11 ENCOUNTER — Ambulatory Visit (INDEPENDENT_AMBULATORY_CARE_PROVIDER_SITE_OTHER): Payer: Medicare Other | Admitting: Sports Medicine

## 2014-07-11 VITALS — BP 132/68 | HR 81 | Wt 212.0 lb

## 2014-07-11 DIAGNOSIS — M51379 Other intervertebral disc degeneration, lumbosacral region without mention of lumbar back pain or lower extremity pain: Secondary | ICD-10-CM

## 2014-07-11 DIAGNOSIS — M170 Bilateral primary osteoarthritis of knee: Secondary | ICD-10-CM

## 2014-07-11 DIAGNOSIS — M1710 Unilateral primary osteoarthritis, unspecified knee: Secondary | ICD-10-CM

## 2014-07-11 DIAGNOSIS — M5136 Other intervertebral disc degeneration, lumbar region: Secondary | ICD-10-CM

## 2014-07-11 DIAGNOSIS — M5137 Other intervertebral disc degeneration, lumbosacral region: Secondary | ICD-10-CM

## 2014-07-11 DIAGNOSIS — M51369 Other intervertebral disc degeneration, lumbar region without mention of lumbar back pain or lower extremity pain: Secondary | ICD-10-CM

## 2014-07-11 MED ORDER — DICLOFENAC SODIUM 75 MG PO TBEC: 75.0000 mg | DELAYED_RELEASE_TABLET | Freq: Two times a day (BID) | ORAL | Status: DC

## 2014-07-11 NOTE — Assessment & Plan Note (Signed)
Recurrent left-sided L4-L5 radicular symptoms, repeating epidural per patient request. This is clouding the picture as to whether her knee Visco supplementation is helping.

## 2014-07-11 NOTE — Progress Notes (Signed)
  Subjective:    CC: OrthoVisc injection  HPI: Knee osteoarthritis: Bilateral, right knee is pain-free, here for OrthoVisc injection #4 to both knees. Left knee still hurts. Previous cultures and cell counts from synovial fluid were negative.  Lumbar radiculitis: Desires repeat left-sided L4-L5 interlaminar epidural. Her L5-S1 epidurals were not effective. She does have some left-sided radicular symptoms returning.  Past medical history, Surgical history, Family history not pertinant except as noted below, Social history, Allergies, and medications have been entered into the medical record, reviewed, and no changes needed.   Review of Systems: No fevers, chills, night sweats, weight loss, chest pain, or shortness of breath.   Objective:    General: Well Developed, well nourished, and in no acute distress.  Neuro: Alert and oriented x3, extra-ocular muscles intact, sensation grossly intact.  HEENT: Normocephalic, atraumatic, pupils equal round reactive to light, neck supple, no masses, no lymphadenopathy, thyroid nonpalpable.  Skin: Warm and dry, no rashes. Cardiac: Regular rate and rhythm, no murmurs rubs or gallops, no lower extremity edema.  Respiratory: Clear to auscultation bilaterally. Not using accessory muscles, speaking in full sentences.  Procedure: Real-time Ultrasound Guided Injection of right knee Device: GE Logiq E  Verbal informed consent obtained.  Time-out conducted.  Noted no overlying erythema, induration, or other signs of local infection.  Skin prepped in a sterile fashion.  Local anesthesia: Topical Ethyl chloride.  With sterile technique and under real time ultrasound guidance:   30 mg/2 mL of OrthoVisc (sodium hyaluronate) in a prefilled syringe was injected easily into the knee through a 22-gauge needle. Completed without difficulty  Pain immediately resolved suggesting accurate placement of the medication.  Advised to call if fevers/chills, erythema,  induration, drainage, or persistent bleeding.  Images permanently stored and available for review in the ultrasound unit.  Impression: Technically successful ultrasound guided injection.  Procedure: Real-time Ultrasound Guided Injection of left knee Device: GE Logiq E  Verbal informed consent obtained.  Time-out conducted.  Noted no overlying erythema, induration, or other signs of local infection.  Skin prepped in a sterile fashion.  Local anesthesia: Topical Ethyl chloride.  With sterile technique and under real time ultrasound guidance: 21 cc of slightly cloudy straw-colored fluid aspirated syringe switched and 30 mg/2 mL of OrthoVisc (sodium hyaluronate) in a prefilled syringe was injected easily into the knee through a 18-gauge needle.  Completed without difficulty  Pain immediately resolved suggesting accurate placement of the medication.  Advised to call if fevers/chills, erythema, induration, drainage, or persistent bleeding.  Images permanently stored and available for review in the ultrasound unit.  Impression: Technically successful ultrasound guided injection.  Impression and Recommendations:

## 2014-07-11 NOTE — Assessment & Plan Note (Addendum)
OrthoVisc injection #4 of 4 into both knees. Right knee is pain-free, left knee still has some pain but she is having some left-sided radicular symptoms as well. Fluid aspirated from the left knee was again cloudy however cultures a week ago were negative. This is simply an inflamed knee joint. Left knee was strapped with compressive dressing.

## 2014-07-29 ENCOUNTER — Ambulatory Visit
Admission: RE | Admit: 2014-07-29 | Discharge: 2014-07-29 | Disposition: A | Discharge status: Home / Self Care | Payer: Medicare Other | Source: Ambulatory Visit | Attending: Sports Medicine | Admitting: Sports Medicine

## 2014-07-29 DIAGNOSIS — M545 Low back pain, unspecified: Secondary | ICD-10-CM | POA: Diagnosis not present

## 2014-07-29 MED ORDER — METHYLPREDNISOLONE ACETATE 40 MG/ML INJ SUSP (RADIOLOG
120.0000 mg | Freq: Once | INTRAMUSCULAR | Status: AC
  Administered 2014-07-29: 120 mg via EPIDURAL

## 2014-07-29 MED ORDER — IOHEXOL 180 MG/ML  SOLN
1.0000 mL | Freq: Once | INTRAMUSCULAR | Status: AC | PRN
  Administered 2014-07-29: 1 mL via INTRAVENOUS

## 2014-07-29 NOTE — Discharge Instructions (Signed)

## 2014-08-11 ENCOUNTER — Encounter: Payer: Self-pay | Admitting: Sports Medicine

## 2014-08-11 ENCOUNTER — Ambulatory Visit (INDEPENDENT_AMBULATORY_CARE_PROVIDER_SITE_OTHER): Payer: Medicare Other | Admitting: Sports Medicine

## 2014-08-11 VITALS — BP 148/64 | HR 67 | Ht 66.0 in | Wt 213.0 lb

## 2014-08-11 DIAGNOSIS — M5137 Other intervertebral disc degeneration, lumbosacral region: Secondary | ICD-10-CM | POA: Diagnosis not present

## 2014-08-11 DIAGNOSIS — M51369 Other intervertebral disc degeneration, lumbar region without mention of lumbar back pain or lower extremity pain: Secondary | ICD-10-CM

## 2014-08-11 DIAGNOSIS — M5136 Other intervertebral disc degeneration, lumbar region: Secondary | ICD-10-CM

## 2014-08-11 DIAGNOSIS — M51379 Other intervertebral disc degeneration, lumbosacral region without mention of lumbar back pain or lower extremity pain: Secondary | ICD-10-CM | POA: Diagnosis not present

## 2014-08-11 NOTE — Progress Notes (Signed)
  Subjective:    CC: Followup  HPI: Bilateral knee osteoarthritis : overall doing well after injection. Cell count and cultures were negative.  Lumbar spondylosis: With significant degenerative disc disease and left-sided radiculopathy, she has multilevel degenerative disc disease, she's had multiple epidurals on the left at the L4-L5 level which had been effective, interestingly she gets numbness and tingling in S1 distribution after her epidurals but relief of pain. More recently she had an epidural, affect was temporary but she has a recurrence of pain.  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  Past medical history, Surgical history, Family history not pertinant except as noted below, Social history, Allergies, and medications have been entered into the medical record, reviewed, and no changes needed.   Review of Systems: No fevers, chills, night sweats, weight loss, chest pain, or shortness of breath.   Objective:    General: Well Developed, well nourished, and in no acute distress.  Neuro: Alert and oriented x3, extra-ocular muscles intact, sensation grossly intact.  HEENT: Normocephalic, atraumatic, pupils equal round reactive to light, neck supple, no masses, no lymphadenopathy, thyroid nonpalpable.  Skin: Warm and dry, no rashes. Cardiac: Regular rate and rhythm, no murmurs rubs or gallops, no lower extremity edema.  Respiratory: Clear to auscultation bilaterally. Not using accessory muscles, speaking in full sentences.  Impression and Recommendations:

## 2014-08-11 NOTE — Assessment & Plan Note (Signed)
Continues to have good responses to L4-L5 interlaminar epidurals, interestingly after an L4-L5 epidural she tends to have relief in more of an S1 distribution. At this point I would like her to touch base with neurosurgery, her leg pain is significantly worse than her back pain. I'm also going to order nerve conduction study. Certainly when she gets worsening SI pain/axial pain, we can repeat her sacroiliac joint under guidance.

## 2014-08-27 ENCOUNTER — Other Ambulatory Visit: Payer: Self-pay | Admitting: Family Medicine

## 2014-08-27 DIAGNOSIS — H527 Unspecified disorder of refraction: Secondary | ICD-10-CM | POA: Diagnosis not present

## 2014-08-27 DIAGNOSIS — H43813 Vitreous degeneration, bilateral: Secondary | ICD-10-CM | POA: Diagnosis not present

## 2014-08-27 DIAGNOSIS — H25813 Combined forms of age-related cataract, bilateral: Secondary | ICD-10-CM | POA: Diagnosis not present

## 2014-08-27 DIAGNOSIS — H52223 Regular astigmatism, bilateral: Secondary | ICD-10-CM | POA: Diagnosis not present

## 2014-09-01 ENCOUNTER — Ambulatory Visit: Payer: Medicare Other | Admitting: Sports Medicine

## 2014-09-05 DIAGNOSIS — M4806 Spinal stenosis, lumbar region: Secondary | ICD-10-CM | POA: Diagnosis not present

## 2014-09-05 DIAGNOSIS — M5416 Radiculopathy, lumbar region: Secondary | ICD-10-CM | POA: Diagnosis not present

## 2014-09-09 ENCOUNTER — Other Ambulatory Visit: Payer: Self-pay | Admitting: Neurosurgery

## 2014-09-09 DIAGNOSIS — M4806 Spinal stenosis, lumbar region: Secondary | ICD-10-CM | POA: Diagnosis not present

## 2014-09-09 DIAGNOSIS — Z6835 Body mass index (BMI) 35.0-35.9, adult: Secondary | ICD-10-CM | POA: Diagnosis not present

## 2014-09-09 DIAGNOSIS — I10 Essential (primary) hypertension: Secondary | ICD-10-CM | POA: Diagnosis not present

## 2014-09-09 DIAGNOSIS — M5416 Radiculopathy, lumbar region: Secondary | ICD-10-CM | POA: Diagnosis not present

## 2014-09-10 ENCOUNTER — Other Ambulatory Visit: Payer: Self-pay | Admitting: Neurosurgery

## 2014-09-10 DIAGNOSIS — M5417 Radiculopathy, lumbosacral region: Secondary | ICD-10-CM

## 2014-09-12 ENCOUNTER — Encounter: Payer: Self-pay | Admitting: Sports Medicine

## 2014-09-12 ENCOUNTER — Ambulatory Visit (INDEPENDENT_AMBULATORY_CARE_PROVIDER_SITE_OTHER): Payer: Medicare Other | Admitting: Sports Medicine

## 2014-09-12 VITALS — BP 148/70 | HR 96 | Ht 65.0 in | Wt 216.0 lb

## 2014-09-12 DIAGNOSIS — M51369 Other intervertebral disc degeneration, lumbar region without mention of lumbar back pain or lower extremity pain: Secondary | ICD-10-CM

## 2014-09-12 DIAGNOSIS — M170 Bilateral primary osteoarthritis of knee: Secondary | ICD-10-CM | POA: Diagnosis not present

## 2014-09-12 DIAGNOSIS — M5136 Other intervertebral disc degeneration, lumbar region: Secondary | ICD-10-CM | POA: Diagnosis not present

## 2014-09-12 NOTE — Assessment & Plan Note (Signed)
Bilateral aspiration and injection. OrthoVisc was finished 2 months ago.

## 2014-09-12 NOTE — Assessment & Plan Note (Signed)
Operative intervention planned by [place] neurosurgery.

## 2014-09-12 NOTE — Progress Notes (Signed)
  Subjective:    CC: Followup  HPI: Bilateral knee osteoarthritis: Last injection was approximately 3 months ago, repeat injection desire today, pain is moderate, persistent, localized to the joint lines, with swelling.  Lumbar radiculitis with degenerative disc disease: She has seen neurosurgery, had I nerve conduction study the results of which are not available, she is getting set up for operative intervention.  Past medical history, Surgical history, Family history not pertinant except as noted below, Social history, Allergies, and medications have been entered into the medical record, reviewed, and no changes needed.   Review of Systems: No fevers, chills, night sweats, weight loss, chest pain, or shortness of breath.   Objective:    General: Well Developed, well nourished, and in no acute distress.  Neuro: Alert and oriented x3, extra-ocular muscles intact, sensation grossly intact.  HEENT: Normocephalic, atraumatic, pupils equal round reactive to light, neck supple, no masses, no lymphadenopathy, thyroid nonpalpable.  Skin: Warm and dry, no rashes. Cardiac: Regular rate and rhythm, no murmurs rubs or gallops, no lower extremity edema.  Respiratory: Clear to auscultation bilaterally. Not using accessory muscles, speaking in full sentences.  Procedure: Real-time Ultrasound Guided aspiration/ Injection of left knee Device: GE Logiq E  Verbal informed consent obtained.  Time-out conducted.  Noted no overlying erythema, induration, or other signs of local infection.  Skin prepped in a sterile fashion.  Local anesthesia: Topical Ethyl chloride.  With sterile technique and under real time ultrasound guidance:  20 cc of straw-colored fluid aspirated, syringe switched and 2 cc kenalog 40, 4 cc lidocaine injected easily. Completed without difficulty  Pain immediately resolved suggesting accurate placement of the medication.  Advised to call if fevers/chills, erythema, induration,  drainage, or persistent bleeding.  Images permanently stored and available for review in the ultrasound unit.  Impression: Technically successful ultrasound guided injection.  Procedure: Real-time Ultrasound Guided aspiration/ Injection of right knee Device: GE Logiq E  Verbal informed consent obtained.  Time-out conducted.  Noted no overlying erythema, induration, or other signs of local infection.  Skin prepped in a sterile fashion.  Local anesthesia: Topical Ethyl chloride.  With sterile technique and under real time ultrasound guidance:  20 cc of straw-colored fluid aspirated, syringe switched and 2 cc kenalog 40, 4 cc lidocaine injected easily. Completed without difficulty  Pain immediately resolved suggesting accurate placement of the medication.  Advised to call if fevers/chills, erythema, induration, drainage, or persistent bleeding.  Images permanently stored and available for review in the ultrasound unit.  Impression: Technically successful ultrasound guided injection.  Impression and Recommendations:

## 2014-09-12 NOTE — Assessment & Plan Note (Signed)
She is getting set up for operative intervention by neurosurgery, there was some question by the neurosurgeon as to whether her knees should be operated on first however since she is doing well with occasional interventional treatment approximately once every 3 months, we can safely continue this.

## 2014-09-15 ENCOUNTER — Ambulatory Visit (INDEPENDENT_AMBULATORY_CARE_PROVIDER_SITE_OTHER): Payer: Medicare Other

## 2014-09-15 DIAGNOSIS — M5417 Radiculopathy, lumbosacral region: Secondary | ICD-10-CM

## 2014-09-15 DIAGNOSIS — M5416 Radiculopathy, lumbar region: Secondary | ICD-10-CM | POA: Diagnosis not present

## 2014-09-15 DIAGNOSIS — M4806 Spinal stenosis, lumbar region: Secondary | ICD-10-CM | POA: Diagnosis not present

## 2014-09-16 DIAGNOSIS — M5416 Radiculopathy, lumbar region: Secondary | ICD-10-CM | POA: Diagnosis not present

## 2014-09-16 DIAGNOSIS — M4806 Spinal stenosis, lumbar region: Secondary | ICD-10-CM | POA: Diagnosis not present

## 2014-09-16 DIAGNOSIS — I10 Essential (primary) hypertension: Secondary | ICD-10-CM | POA: Diagnosis not present

## 2014-09-16 DIAGNOSIS — Z6836 Body mass index (BMI) 36.0-36.9, adult: Secondary | ICD-10-CM | POA: Diagnosis not present

## 2014-09-19 DIAGNOSIS — M159 Polyosteoarthritis, unspecified: Secondary | ICD-10-CM | POA: Diagnosis not present

## 2014-09-19 DIAGNOSIS — R768 Other specified abnormal immunological findings in serum: Secondary | ICD-10-CM | POA: Diagnosis not present

## 2014-09-22 ENCOUNTER — Ambulatory Visit: Payer: Medicare Other | Admitting: Sports Medicine

## 2014-09-24 ENCOUNTER — Telehealth: Payer: Self-pay | Admitting: *Deleted

## 2014-09-24 DIAGNOSIS — H25813 Combined forms of age-related cataract, bilateral: Secondary | ICD-10-CM | POA: Diagnosis not present

## 2014-09-24 DIAGNOSIS — H527 Unspecified disorder of refraction: Secondary | ICD-10-CM | POA: Diagnosis not present

## 2014-09-24 DIAGNOSIS — H52223 Regular astigmatism, bilateral: Secondary | ICD-10-CM | POA: Diagnosis not present

## 2014-09-24 DIAGNOSIS — H43813 Vitreous degeneration, bilateral: Secondary | ICD-10-CM | POA: Diagnosis not present

## 2014-09-24 NOTE — Telephone Encounter (Signed)
Care everywhere.Katherine Nixon [place]  

## 2014-10-09 DIAGNOSIS — L820 Inflamed seborrheic keratosis: Secondary | ICD-10-CM | POA: Diagnosis not present

## 2014-10-13 ENCOUNTER — Encounter: Payer: Self-pay | Admitting: Sports Medicine

## 2014-10-13 ENCOUNTER — Ambulatory Visit (INDEPENDENT_AMBULATORY_CARE_PROVIDER_SITE_OTHER): Payer: Medicare Other | Admitting: Sports Medicine

## 2014-10-13 VITALS — BP 161/80 | HR 95 | Wt 216.0 lb

## 2014-10-13 DIAGNOSIS — M170 Bilateral primary osteoarthritis of knee: Secondary | ICD-10-CM | POA: Diagnosis not present

## 2014-10-13 MED ORDER — ACETAMINOPHEN ER 650 MG PO TBCR: 1300.0000 mg | EXTENDED_RELEASE_TABLET | Freq: Three times a day (TID) | ORAL | Status: DC | PRN

## 2014-10-13 NOTE — Assessment & Plan Note (Signed)
Buckling and pain in the left knee suggestive of a lateral meniscal tear. No pain after injection one month ago. This does suggest weakening of the musculature, we are going to do formal physical therapy.  Return to see me in 6 weeks. If no better we can consider an MRI for interventional operative planning.

## 2014-10-13 NOTE — Progress Notes (Signed)
  Subjective:    CC: Follow-up  HPI: Lumbar radiculopathy: Doing well with epidurals, she did see the neurosurgeon who recommended epidurals continue to work then to just continue these rather than proceed with surgical intervention, if epidurals ceased to last 2-3 months then he said he would operate.  Bilateral knee osteoarthritis: Doing well after viscous supplementation, essentially pain free. Unfortunately had an episode where she planted and twisted recently in the knee buckled. Since then she's had no pain but occasional weakness and buckling.  Past medical history, Surgical history, Family history not pertinant except as noted below, Social history, Allergies, and medications have been entered into the medical record, reviewed, and no changes needed.   Review of Systems: No fevers, chills, night sweats, weight loss, chest pain, or shortness of breath.   Objective:    General: Well Developed, well nourished, and in no acute distress.  Neuro: Alert and oriented x3, extra-ocular muscles intact, sensation grossly intact.  HEENT: Normocephalic, atraumatic, pupils equal round reactive to light, neck supple, no masses, no lymphadenopathy, thyroid nonpalpable.  Skin: Warm and dry, no rashes. Cardiac: Regular rate and rhythm, no murmurs rubs or gallops, no lower extremity edema.  Respiratory: Clear to auscultation bilaterally. Not using accessory muscles, speaking in full sentences. Left Knee: Normal to inspection with no erythema or effusion or obvious bony abnormalities. Tender to palpation at the lateral joint line. There is also reproduction of pain with terminal flexion. ROM normal in flexion and extension and lower leg rotation. Ligaments with solid consistent endpoints including ACL, PCL, LCL, MCL. Negative Mcmurray's and provocative meniscal tests. Non painful patellar compression. Patellar and quadriceps tendons unremarkable. Hamstring and quadriceps strength is  normal.  Impression and Recommendations:

## 2014-10-21 DIAGNOSIS — H52221 Regular astigmatism, right eye: Secondary | ICD-10-CM | POA: Diagnosis not present

## 2014-10-21 DIAGNOSIS — H25811 Combined forms of age-related cataract, right eye: Secondary | ICD-10-CM | POA: Diagnosis not present

## 2014-10-21 DIAGNOSIS — H25812 Combined forms of age-related cataract, left eye: Secondary | ICD-10-CM | POA: Diagnosis not present

## 2014-10-21 DIAGNOSIS — H2511 Age-related nuclear cataract, right eye: Secondary | ICD-10-CM | POA: Diagnosis not present

## 2014-10-28 ENCOUNTER — Encounter: Payer: Self-pay | Admitting: Sports Medicine

## 2014-10-28 ENCOUNTER — Ambulatory Visit (INDEPENDENT_AMBULATORY_CARE_PROVIDER_SITE_OTHER): Payer: Medicare Other | Admitting: Sports Medicine

## 2014-10-28 VITALS — BP 132/77 | HR 81 | Ht 65.0 in | Wt 218.0 lb

## 2014-10-28 DIAGNOSIS — M329 Systemic lupus erythematosus, unspecified: Secondary | ICD-10-CM | POA: Diagnosis not present

## 2014-10-28 DIAGNOSIS — M25512 Pain in left shoulder: Secondary | ICD-10-CM

## 2014-10-28 DIAGNOSIS — M25511 Pain in right shoulder: Secondary | ICD-10-CM

## 2014-10-28 MED ORDER — METHOTREXATE SODIUM 7.5 MG PO TABS: 7.5000 mg | ORAL_TABLET | ORAL | Status: DC

## 2014-10-28 NOTE — Assessment & Plan Note (Signed)
Right glenohumeral injection as above.

## 2014-10-28 NOTE — Assessment & Plan Note (Signed)
Again, high in a titer at [facility] rheumatology but negative confirmatory testing with double-stranded DNA. That suggests that this is not actual lupus but falls in the gray area between. We are going to add methotrexate 7.5 mg weekly in an effort to decrease her overall polyarthralgia. When she returns in a month we can check her CBC and liver function

## 2014-10-28 NOTE — Assessment & Plan Note (Signed)
Left acromioclavicular injection as above.

## 2014-10-28 NOTE — Progress Notes (Signed)
  Subjective:    CC: follow-up  HPI: Bilateral shoulder pain: Left-sided over the acromioclavicular joint, right sided of the glenohumeral joint, desires interventional treatment.  Bilateral wrist pain: Moderate, persistent, mild swelling, pain is localized at the radiocarpal joints.  Polyarthralgia: Has been approximately for quite in the past without any improvement, she did have a high antinuclear antibody titer but negative double-stranded daily.  Past medical history, Surgical history, Family history not pertinant except as noted below, Social history, Allergies, and medications have been entered into the medical record, reviewed, and no changes needed.   Review of Systems: No fevers, chills, night sweats, weight loss, chest pain, or shortness of breath.   Objective:    General: Well Developed, well nourished, and in no acute distress.  Neuro: Alert and oriented x3, extra-ocular muscles intact, sensation grossly intact.  HEENT: Normocephalic, atraumatic, pupils equal round reactive to light, neck supple, no masses, no lymphadenopathy, thyroid nonpalpable.  Skin: Warm and dry, no rashes. Cardiac: Regular rate and rhythm, no murmurs rubs or gallops, no lower extremity edema.  Respiratory: Clear to auscultation bilaterally. Not using accessory muscles, speaking in full sentences.  Procedure: Real-time Ultrasound Guided Injection of left acromioclavicular joint Device: GE Logiq E  Verbal informed consent obtained.  Time-out conducted.  Noted no overlying erythema, induration, or other signs of local infection.  Skin prepped in a sterile fashion.  Local anesthesia: Topical Ethyl chloride.  With sterile technique and under real time ultrasound guidance: noted effusion, 0.5 mL kenalog 40, 0.5 mL lidocaine injected easily.  Completed without difficulty  Pain immediately resolved suggesting accurate placement of the medication.  Advised to call if fevers/chills, erythema, induration,  drainage, or persistent bleeding.  Images permanently stored and available for review in the ultrasound unit.  Impression: Technically successful ultrasound guided injection.  Procedure: Real-time Ultrasound Guided Injection of right glenohumeral joint Device: GE Logiq E  Verbal informed consent obtained.  Time-out conducted.  Noted no overlying erythema, induration, or other signs of local infection.  Skin prepped in a sterile fashion.  Local anesthesia: Topical Ethyl chloride.  With sterile technique and under real time ultrasound guidance:  Spinal needle advanced into joint, 1 mL kenalog 40, 4 mL lidocaine injected easily. Completed without difficulty  Pain immediately resolved suggesting accurate placement of the medication.  Advised to call if fevers/chills, erythema, induration, drainage, or persistent bleeding.  Images permanently stored and available for review in the ultrasound unit.  Impression: Technically successful ultrasound guided injection.  Impression and Recommendations:

## 2014-10-30 ENCOUNTER — Ambulatory Visit (INDEPENDENT_AMBULATORY_CARE_PROVIDER_SITE_OTHER): Payer: Medicare Other | Admitting: Physical Therapy

## 2014-10-30 DIAGNOSIS — M2550 Pain in unspecified joint: Secondary | ICD-10-CM | POA: Diagnosis not present

## 2014-10-30 DIAGNOSIS — M6281 Muscle weakness (generalized): Secondary | ICD-10-CM

## 2014-10-30 DIAGNOSIS — R2689 Other abnormalities of gait and mobility: Secondary | ICD-10-CM | POA: Diagnosis not present

## 2014-10-30 DIAGNOSIS — M170 Bilateral primary osteoarthritis of knee: Secondary | ICD-10-CM

## 2014-11-04 ENCOUNTER — Encounter (INDEPENDENT_AMBULATORY_CARE_PROVIDER_SITE_OTHER): Payer: Medicare Other | Admitting: Physical Therapy

## 2014-11-04 DIAGNOSIS — M6281 Muscle weakness (generalized): Secondary | ICD-10-CM | POA: Diagnosis not present

## 2014-11-04 DIAGNOSIS — R2689 Other abnormalities of gait and mobility: Secondary | ICD-10-CM | POA: Diagnosis not present

## 2014-11-04 DIAGNOSIS — M2550 Pain in unspecified joint: Secondary | ICD-10-CM | POA: Diagnosis not present

## 2014-11-04 DIAGNOSIS — M170 Bilateral primary osteoarthritis of knee: Secondary | ICD-10-CM | POA: Diagnosis not present

## 2014-11-06 ENCOUNTER — Encounter (INDEPENDENT_AMBULATORY_CARE_PROVIDER_SITE_OTHER): Payer: Medicare Other | Admitting: Physical Therapy

## 2014-11-06 DIAGNOSIS — M170 Bilateral primary osteoarthritis of knee: Secondary | ICD-10-CM | POA: Diagnosis not present

## 2014-11-06 DIAGNOSIS — M6281 Muscle weakness (generalized): Secondary | ICD-10-CM

## 2014-11-07 ENCOUNTER — Other Ambulatory Visit: Payer: Self-pay | Admitting: Family Medicine

## 2014-11-07 NOTE — Telephone Encounter (Signed)
Needs appt before future refills

## 2014-11-10 ENCOUNTER — Encounter (INDEPENDENT_AMBULATORY_CARE_PROVIDER_SITE_OTHER): Payer: Medicare Other | Admitting: Physical Therapy

## 2014-11-10 DIAGNOSIS — M6281 Muscle weakness (generalized): Secondary | ICD-10-CM | POA: Diagnosis not present

## 2014-11-10 DIAGNOSIS — M2550 Pain in unspecified joint: Secondary | ICD-10-CM | POA: Diagnosis not present

## 2014-11-10 DIAGNOSIS — R2689 Other abnormalities of gait and mobility: Secondary | ICD-10-CM | POA: Diagnosis not present

## 2014-11-10 DIAGNOSIS — M170 Bilateral primary osteoarthritis of knee: Secondary | ICD-10-CM | POA: Diagnosis not present

## 2014-11-12 ENCOUNTER — Encounter (INDEPENDENT_AMBULATORY_CARE_PROVIDER_SITE_OTHER): Payer: Medicare Other | Admitting: Physical Therapy

## 2014-11-12 DIAGNOSIS — M2550 Pain in unspecified joint: Secondary | ICD-10-CM

## 2014-11-12 DIAGNOSIS — M6281 Muscle weakness (generalized): Secondary | ICD-10-CM

## 2014-11-12 DIAGNOSIS — M170 Bilateral primary osteoarthritis of knee: Secondary | ICD-10-CM

## 2014-11-12 DIAGNOSIS — R2689 Other abnormalities of gait and mobility: Secondary | ICD-10-CM

## 2014-11-17 ENCOUNTER — Encounter (INDEPENDENT_AMBULATORY_CARE_PROVIDER_SITE_OTHER): Payer: Medicare Other | Admitting: Physical Therapy

## 2014-11-17 DIAGNOSIS — M2550 Pain in unspecified joint: Secondary | ICD-10-CM

## 2014-11-17 DIAGNOSIS — M170 Bilateral primary osteoarthritis of knee: Secondary | ICD-10-CM | POA: Diagnosis not present

## 2014-11-17 DIAGNOSIS — R2689 Other abnormalities of gait and mobility: Secondary | ICD-10-CM | POA: Diagnosis not present

## 2014-11-17 DIAGNOSIS — M6281 Muscle weakness (generalized): Secondary | ICD-10-CM

## 2014-11-19 ENCOUNTER — Encounter (INDEPENDENT_AMBULATORY_CARE_PROVIDER_SITE_OTHER): Payer: Medicare Other | Admitting: Physical Therapy

## 2014-11-19 DIAGNOSIS — M6281 Muscle weakness (generalized): Secondary | ICD-10-CM

## 2014-11-19 DIAGNOSIS — M2550 Pain in unspecified joint: Secondary | ICD-10-CM

## 2014-11-19 DIAGNOSIS — R2689 Other abnormalities of gait and mobility: Secondary | ICD-10-CM | POA: Diagnosis not present

## 2014-11-19 DIAGNOSIS — M170 Bilateral primary osteoarthritis of knee: Secondary | ICD-10-CM

## 2014-11-24 ENCOUNTER — Ambulatory Visit (INDEPENDENT_AMBULATORY_CARE_PROVIDER_SITE_OTHER): Payer: Medicare Other

## 2014-11-24 ENCOUNTER — Ambulatory Visit (INDEPENDENT_AMBULATORY_CARE_PROVIDER_SITE_OTHER): Payer: Medicare Other | Admitting: Sports Medicine

## 2014-11-24 ENCOUNTER — Encounter: Payer: Self-pay | Admitting: Sports Medicine

## 2014-11-24 VITALS — BP 160/86 | HR 103 | Ht 65.0 in | Wt 218.0 lb

## 2014-11-24 DIAGNOSIS — M170 Bilateral primary osteoarthritis of knee: Secondary | ICD-10-CM | POA: Diagnosis not present

## 2014-11-24 DIAGNOSIS — M25512 Pain in left shoulder: Secondary | ICD-10-CM | POA: Diagnosis not present

## 2014-11-24 DIAGNOSIS — M19012 Primary osteoarthritis, left shoulder: Secondary | ICD-10-CM | POA: Diagnosis not present

## 2014-11-24 DIAGNOSIS — M329 Systemic lupus erythematosus, unspecified: Secondary | ICD-10-CM

## 2014-11-24 MED ORDER — METHOTREXATE SODIUM 15 MG PO TABS: 15.0000 mg | ORAL_TABLET | ORAL | Status: DC

## 2014-11-24 MED ORDER — ACETAMINOPHEN-CODEINE #4 300-60 MG PO TABS
1.0000 | ORAL_TABLET | ORAL | Status: DC | PRN
Start: 2014-11-24 — End: 2014-12-22

## 2014-11-24 NOTE — Assessment & Plan Note (Signed)
Starting to improve with formal physical therapy. She did initially have some mechanical symptoms and buckling suggestive of a lateral meniscal tear. She has done well after injection 2 months ago.

## 2014-11-24 NOTE — Assessment & Plan Note (Addendum)
Pain over the deltoid with difficulty raising the arm. She likely does have rotator cuff tear, we do need to obtain an x-ray and MRI to evaluate the acuity. If it is acute she would probably be a candidate for urgent repair.  Return to see me to go over results of the MRI. We will also have physical therapy work on her shoulder in the meantime.

## 2014-11-24 NOTE — Progress Notes (Signed)
  Subjective:    CC: follow-up  HPI: Lupus unspecified: Continues to have polyarthralgia despite Tylenol 1300 mg 3 times a day, methotrexate 7.5 mg weekly. We have done multiple injections, all of which seemed to help only temporarily. She did recently see her rheumatologist at [facility] who seems to think this may represent more of a myofascial type process, and prescribed Cymbalta which Katherine Nixon has not yet started.  she has not yet noted any side effects from methotrexate but has also not noted any improvement in her symptoms.  No abdominal pain, no yellowing of skin. No diarrhea.  Symptoms are severe, persistent.  Left shoulder pain: She tells me she continues to have pain over the deltoid is unable to fully abduct her shoulder. No recent trauma. She has not had any recent injections into the subacromial bursa.  Bilateral knee osteoarthritis: Improving  significantly with physical therapy.  Past medical history, Surgical history, Family history not pertinant except as noted below, Social history, Allergies, and medications have been entered into the medical record, reviewed, and no changes needed.   Review of Systems: No fevers, chills, night sweats, weight loss, chest pain, or shortness of breath.   Objective:    General: Well Developed, well nourished, and in no acute distress.  Neuro: Alert and oriented x3, extra-ocular muscles intact, sensation grossly intact.  HEENT: Normocephalic, atraumatic, pupils equal round reactive to light, neck supple, no masses, no lymphadenopathy, thyroid nonpalpable.  Skin: Warm and dry, no rashes. Cardiac: Regular rate and rhythm, no murmurs rubs or gallops, no lower extremity edema.  Respiratory: Clear to auscultation bilaterally. Not using accessory muscles, speaking in full sentences. Left shoulder: Unable to abduct past 20, positive painful arc, negative drop arm sign.  Significant weakness of the supraspinatus.  Impression and Recommendations:

## 2014-11-24 NOTE — Assessment & Plan Note (Signed)
Again, high ANA titer at [facility] rheumatology but negative confirmatory testing with a double-stranded DNA that was negative. 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 No response yet to 7.5 mg of methotrexate, increasing to 15 mg weekly. Checking CBC and metabolic panel. She was also prescribed Cymbalta by her rheumatologist, I think she should start this. Discontinue arthritis strength Tylenol, starting Tylenol with Codeine.

## 2014-11-25 ENCOUNTER — Encounter (INDEPENDENT_AMBULATORY_CARE_PROVIDER_SITE_OTHER): Payer: Medicare Other | Admitting: Physical Therapy

## 2014-11-25 DIAGNOSIS — M170 Bilateral primary osteoarthritis of knee: Secondary | ICD-10-CM | POA: Diagnosis present

## 2014-11-25 DIAGNOSIS — M6281 Muscle weakness (generalized): Secondary | ICD-10-CM | POA: Diagnosis not present

## 2014-11-25 DIAGNOSIS — R2689 Other abnormalities of gait and mobility: Secondary | ICD-10-CM | POA: Diagnosis not present

## 2014-11-25 DIAGNOSIS — M2550 Pain in unspecified joint: Secondary | ICD-10-CM

## 2014-11-25 LAB — COMPREHENSIVE METABOLIC PANEL WITH GFR
AST: 18 U/L (ref 0–37)
Alkaline Phosphatase: 99 U/L (ref 39–117)
BUN: 11 mg/dL (ref 6–23)
Calcium: 10.3 mg/dL (ref 8.4–10.5)
Total Bilirubin: 0.4 mg/dL (ref 0.2–1.2)

## 2014-11-25 LAB — COMPREHENSIVE METABOLIC PANEL
ALT: 11 U/L (ref 0–35)
Albumin: 4 g/dL (ref 3.5–5.2)
CO2: 27 mEq/L (ref 19–32)
Chloride: 104 mEq/L (ref 96–112)
Creat: 0.65 mg/dL (ref 0.50–1.10)
Glucose, Bld: 93 mg/dL (ref 70–99)
Potassium: 3.9 mEq/L (ref 3.5–5.3)
Sodium: 140 mEq/L (ref 135–145)
Total Protein: 7.2 g/dL (ref 6.0–8.3)

## 2014-11-25 LAB — CBC
HCT: 41.7 % (ref 36.0–46.0)
Hemoglobin: 13.3 g/dL (ref 12.0–15.0)
MCH: 29.4 pg (ref 26.0–34.0)
MCHC: 31.9 g/dL (ref 30.0–36.0)
MCV: 92.3 fL (ref 78.0–100.0)
MPV: 8.5 fL — ABNORMAL LOW (ref 8.6–12.4)
Platelets: 270 K/uL (ref 150–400)
RBC: 4.52 MIL/uL (ref 3.87–5.11)
RDW: 15.6 % — ABNORMAL HIGH (ref 11.5–15.5)
WBC: 7.5 K/uL (ref 4.0–10.5)

## 2014-11-28 ENCOUNTER — Ambulatory Visit (INDEPENDENT_AMBULATORY_CARE_PROVIDER_SITE_OTHER): Payer: Medicare Other | Admitting: Physical Therapy

## 2014-11-28 DIAGNOSIS — M25512 Pain in left shoulder: Secondary | ICD-10-CM | POA: Diagnosis not present

## 2014-11-28 DIAGNOSIS — M2550 Pain in unspecified joint: Secondary | ICD-10-CM | POA: Diagnosis not present

## 2014-11-28 DIAGNOSIS — M6281 Muscle weakness (generalized): Secondary | ICD-10-CM

## 2014-11-28 DIAGNOSIS — M172 Bilateral post-traumatic osteoarthritis of knee: Secondary | ICD-10-CM

## 2014-11-28 DIAGNOSIS — R2689 Other abnormalities of gait and mobility: Secondary | ICD-10-CM | POA: Diagnosis not present

## 2014-12-01 ENCOUNTER — Ambulatory Visit (INDEPENDENT_AMBULATORY_CARE_PROVIDER_SITE_OTHER): Payer: Medicare Other

## 2014-12-01 DIAGNOSIS — M25512 Pain in left shoulder: Secondary | ICD-10-CM

## 2014-12-01 DIAGNOSIS — M19012 Primary osteoarthritis, left shoulder: Secondary | ICD-10-CM

## 2014-12-01 DIAGNOSIS — M65812 Other synovitis and tenosynovitis, left shoulder: Secondary | ICD-10-CM

## 2014-12-01 DIAGNOSIS — M75102 Unspecified rotator cuff tear or rupture of left shoulder, not specified as traumatic: Secondary | ICD-10-CM | POA: Diagnosis not present

## 2014-12-01 DIAGNOSIS — M25412 Effusion, left shoulder: Secondary | ICD-10-CM

## 2014-12-02 ENCOUNTER — Telehealth: Payer: Self-pay | Admitting: Family Medicine

## 2014-12-02 DIAGNOSIS — M329 Systemic lupus erythematosus, unspecified: Secondary | ICD-10-CM

## 2014-12-02 NOTE — Telephone Encounter (Signed)
Katherine Nixon is okay with [place] sending script short term  to [facility] but long term she wants it to be filled wtith [facility]. She states Dr.  Darene Lamer upped her script from 7.5 mg to 15mg ,. She wants [place] to write script for 15 mg.  [facility] to [facility] at 7.5 mg and she doubled up on it so she is down to one pill.   I also mentioned to her that her next refill will be with [facility] as you said.  Thank you.

## 2014-12-02 NOTE — Telephone Encounter (Signed)
Will forward to Dr. Darene Lamer since he has been writing this rx for her.Katherine Nixon

## 2014-12-03 ENCOUNTER — Encounter (INDEPENDENT_AMBULATORY_CARE_PROVIDER_SITE_OTHER): Payer: Medicare Other | Admitting: Physical Therapy

## 2014-12-03 DIAGNOSIS — M2550 Pain in unspecified joint: Secondary | ICD-10-CM | POA: Diagnosis not present

## 2014-12-03 DIAGNOSIS — M170 Bilateral primary osteoarthritis of knee: Secondary | ICD-10-CM | POA: Diagnosis not present

## 2014-12-03 DIAGNOSIS — R2689 Other abnormalities of gait and mobility: Secondary | ICD-10-CM | POA: Diagnosis not present

## 2014-12-03 DIAGNOSIS — M6281 Muscle weakness (generalized): Secondary | ICD-10-CM | POA: Diagnosis not present

## 2014-12-03 DIAGNOSIS — M25512 Pain in left shoulder: Secondary | ICD-10-CM | POA: Diagnosis not present

## 2014-12-03 MED ORDER — METHOTREXATE SODIUM 15 MG PO TABS: 15.0000 mg | ORAL_TABLET | ORAL | Status: DC

## 2014-12-03 NOTE — Telephone Encounter (Signed)
I suppose we are talking about methotrexate? Sent in to [facility].

## 2014-12-05 ENCOUNTER — Other Ambulatory Visit: Payer: Medicare Other | Admitting: *Deleted

## 2014-12-05 ENCOUNTER — Encounter (INDEPENDENT_AMBULATORY_CARE_PROVIDER_SITE_OTHER): Payer: Medicare Other | Admitting: Physical Therapy

## 2014-12-05 DIAGNOSIS — M170 Bilateral primary osteoarthritis of knee: Secondary | ICD-10-CM

## 2014-12-05 DIAGNOSIS — M6281 Muscle weakness (generalized): Secondary | ICD-10-CM

## 2014-12-05 DIAGNOSIS — M2550 Pain in unspecified joint: Secondary | ICD-10-CM | POA: Diagnosis not present

## 2014-12-05 DIAGNOSIS — R2689 Other abnormalities of gait and mobility: Secondary | ICD-10-CM | POA: Diagnosis not present

## 2014-12-05 DIAGNOSIS — M25512 Pain in left shoulder: Secondary | ICD-10-CM

## 2014-12-05 MED ORDER — HYDROCHLOROTHIAZIDE 25 MG PO TABS: ORAL_TABLET | ORAL | Status: DC

## 2014-12-08 ENCOUNTER — Encounter (INDEPENDENT_AMBULATORY_CARE_PROVIDER_SITE_OTHER): Payer: Medicare Other | Admitting: Physical Therapy

## 2014-12-08 DIAGNOSIS — M2550 Pain in unspecified joint: Secondary | ICD-10-CM | POA: Diagnosis not present

## 2014-12-08 DIAGNOSIS — M170 Bilateral primary osteoarthritis of knee: Secondary | ICD-10-CM

## 2014-12-08 DIAGNOSIS — R2689 Other abnormalities of gait and mobility: Secondary | ICD-10-CM | POA: Diagnosis not present

## 2014-12-08 DIAGNOSIS — M6281 Muscle weakness (generalized): Secondary | ICD-10-CM | POA: Diagnosis not present

## 2014-12-08 DIAGNOSIS — M25512 Pain in left shoulder: Secondary | ICD-10-CM

## 2014-12-09 DIAGNOSIS — H25812 Combined forms of age-related cataract, left eye: Secondary | ICD-10-CM | POA: Diagnosis not present

## 2014-12-09 DIAGNOSIS — H52202 Unspecified astigmatism, left eye: Secondary | ICD-10-CM | POA: Diagnosis not present

## 2014-12-09 DIAGNOSIS — H2512 Age-related nuclear cataract, left eye: Secondary | ICD-10-CM | POA: Diagnosis not present

## 2014-12-11 ENCOUNTER — Encounter: Payer: Medicare Other | Admitting: Physical Therapy

## 2014-12-15 ENCOUNTER — Encounter (INDEPENDENT_AMBULATORY_CARE_PROVIDER_SITE_OTHER): Payer: Medicare Other | Admitting: Physical Therapy

## 2014-12-15 DIAGNOSIS — M6281 Muscle weakness (generalized): Secondary | ICD-10-CM

## 2014-12-15 DIAGNOSIS — M2550 Pain in unspecified joint: Secondary | ICD-10-CM | POA: Diagnosis not present

## 2014-12-15 DIAGNOSIS — R2689 Other abnormalities of gait and mobility: Secondary | ICD-10-CM | POA: Diagnosis not present

## 2014-12-15 DIAGNOSIS — M25512 Pain in left shoulder: Secondary | ICD-10-CM

## 2014-12-15 DIAGNOSIS — M1710 Unilateral primary osteoarthritis, unspecified knee: Secondary | ICD-10-CM | POA: Diagnosis not present

## 2014-12-22 ENCOUNTER — Ambulatory Visit (INDEPENDENT_AMBULATORY_CARE_PROVIDER_SITE_OTHER): Payer: Medicare Other | Admitting: Sports Medicine

## 2014-12-22 ENCOUNTER — Encounter: Payer: Self-pay | Admitting: Sports Medicine

## 2014-12-22 VITALS — BP 143/81 | HR 80 | Ht 65.0 in | Wt 212.0 lb

## 2014-12-22 DIAGNOSIS — M170 Bilateral primary osteoarthritis of knee: Secondary | ICD-10-CM | POA: Diagnosis not present

## 2014-12-22 DIAGNOSIS — M329 Systemic lupus erythematosus, unspecified: Secondary | ICD-10-CM

## 2014-12-22 DIAGNOSIS — M25512 Pain in left shoulder: Secondary | ICD-10-CM | POA: Diagnosis not present

## 2014-12-22 HISTORY — PX: SHOULDER ARTHROSCOPY: SHX128

## 2014-12-22 MED ORDER — ACETAMINOPHEN ER 650 MG PO TBCR: 1300.0000 mg | EXTENDED_RELEASE_TABLET | Freq: Three times a day (TID) | ORAL | Status: DC | PRN

## 2014-12-22 NOTE — Assessment & Plan Note (Signed)
No improvement with increasing dose of methotrexate. We are going to discontinue this medication. Her rheumatologist seems to suspect that some of this is myofascial however considering the degree of widespread synovitis I would disagree. If symptoms worsen with coming off of methotrexate we will restart. Also increasing Cymbalta to 60 mg daily.

## 2014-12-22 NOTE — Assessment & Plan Note (Signed)
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 She has had several injections, and unfortunately the most recent of which have not worked, physical therapy has also been ineffective, as there has been too much pain. 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 At this point I think she has become a candidate for operative arthroscopy, likely with distal clavicular excision, and synovectomy of the glenohumeral joint. Referral to Dr. Tamera Punt for consideration of this.

## 2014-12-22 NOTE — Progress Notes (Signed)
  Subjective:    CC: Follow-up  HPI: Left shoulder pain: Unfortunately failed physical therapy, and injections both into the subacromial and glenohumeral spaces. We recently obtained an MRI of the results of which will be dictated below.   Bilateral knee osteoarthritis: Has been through a series of Orthovisc, now having recurrence of pain 3 months later.  Lupus syndrome: With a positive ANA but negative confirmatory titers, she does have widespread polyarthralgias, and is seen by rheumatology. Unfortunately we have tried methotrexate on 2 occasions at several different dosages without any improvement in her symptoms. She does have synovitis on her advanced imaging. She will follow-up with rheumatology. We did start Cymbalta at the last visit she has not yet noted any improvement.  Past medical history, Surgical history, Family history not pertinant except as noted below, Social history, Allergies, and medications have been entered into the medical record, reviewed, and no changes needed.   Review of Systems: No fevers, chills, night sweats, weight loss, chest pain, or shortness of breath.   Objective:    General: Well Developed, well nourished, and in no acute distress.  Neuro: Alert and oriented x3, extra-ocular muscles intact, sensation grossly intact.  HEENT: Normocephalic, atraumatic, pupils equal round reactive to light, neck supple, no masses, no lymphadenopathy, thyroid nonpalpable.  Skin: Warm and dry, no rashes. Cardiac: Regular rate and rhythm, no murmurs rubs or gallops, no lower extremity edema.  Respiratory: Clear to auscultation bilaterally. Not using accessory muscles, speaking in full sentences. Bilateral  Knee: Visible and palpable effusion, tender to palpation at the medial joint lines.  ROM normal in flexion and extension and lower leg rotation. Ligaments with solid consistent endpoints including ACL, PCL, LCL, MCL. Negative Mcmurray's and provocative meniscal  tests. Non painful patellar compression. Patellar and quadriceps tendons unremarkable. Hamstring and quadriceps strength is normal.  Procedure: Real-time Ultrasound Guided Injection of left knee Device: GE Logiq E  Verbal informed consent obtained.  Time-out conducted.  Noted no overlying erythema, induration, or other signs of local infection.  Skin prepped in a sterile fashion.  Local anesthesia: Topical Ethyl chloride.  With sterile technique and under real time ultrasound guidance: Aspirated 10 mL of straw-colored cloudy fluid, syringe switched and 1 mL Depo-Medrol 40, 4 mL lidocaine injected easily.  Completed without difficulty  Pain immediately resolved suggesting accurate placement of the medication.  Advised to call if fevers/chills, erythema, induration, drainage, or persistent bleeding.  Images permanently stored and available for review in the ultrasound unit.  Impression: Technically successful ultrasound guided injection.  Procedure: Real-time Ultrasound Guided Injection of right knee Device: GE Logiq E  Verbal informed consent obtained.  Time-out conducted.  Noted no overlying erythema, induration, or other signs of local infection.  Skin prepped in a sterile fashion.  Local anesthesia: Topical Ethyl chloride.  With sterile technique and under real time ultrasound guidance: Aspirated 10 mL of straw-colored cloudy fluid, syringe switched and 1 mL Depo-Medrol 40, 4 mL lidocaine injected easily.  Completed without difficulty  Pain immediately resolved suggesting accurate placement of the medication.  Advised to call if fevers/chills, erythema, induration, drainage, or persistent bleeding.  Images permanently stored and available for review in the ultrasound unit.  Impression: Technically successful ultrasound guided injection.  MRI shows severe supraspinatus and infraspinatus tendinopathy, with significant external impingement from a hypertrophied acromial clavicular  joint, there is also significant synovitis in the glenohumeral joint and axillary recess.  Impression and Recommendations:

## 2014-12-22 NOTE — Assessment & Plan Note (Signed)
Aspiration and injection as above today, both knees.

## 2014-12-30 ENCOUNTER — Encounter: Payer: Self-pay | Admitting: Family Medicine

## 2014-12-30 ENCOUNTER — Ambulatory Visit (INDEPENDENT_AMBULATORY_CARE_PROVIDER_SITE_OTHER): Payer: Medicare Other | Admitting: Family Medicine

## 2014-12-30 VITALS — BP 152/77 | HR 82 | Ht 65.0 in | Wt 210.0 lb

## 2014-12-30 DIAGNOSIS — F329 Major depressive disorder, single episode, unspecified: Secondary | ICD-10-CM | POA: Diagnosis not present

## 2014-12-30 DIAGNOSIS — Z23 Encounter for immunization: Secondary | ICD-10-CM

## 2014-12-30 DIAGNOSIS — G4700 Insomnia, unspecified: Secondary | ICD-10-CM | POA: Diagnosis not present

## 2014-12-30 DIAGNOSIS — F32A Depression, unspecified: Secondary | ICD-10-CM

## 2014-12-30 DIAGNOSIS — I10 Essential (primary) hypertension: Secondary | ICD-10-CM | POA: Diagnosis not present

## 2014-12-30 MED ORDER — HYDROCHLOROTHIAZIDE 25 MG PO TABS: ORAL_TABLET | ORAL | Status: DC

## 2014-12-30 MED ORDER — TRAZODONE HCL 100 MG PO TABS: 100.0000 mg | ORAL_TABLET | Freq: Every day | ORAL | Status: DC

## 2014-12-30 MED ORDER — ATORVASTATIN CALCIUM 20 MG PO TABS: 20.0000 mg | ORAL_TABLET | Freq: Every day | ORAL | Status: DC

## 2014-12-30 MED ORDER — AMLODIPINE BESYLATE 5 MG PO TABS: 5.0000 mg | ORAL_TABLET | Freq: Every day | ORAL | Status: DC

## 2014-12-30 NOTE — Patient Instructions (Signed)
Go for your blood work about one week after you have been on the Hydrochlorothiazide daily.

## 2014-12-30 NOTE — Progress Notes (Signed)
   Subjective:    Patient ID: Katherine Nixon, female    DOB: 11/15/1946, 69 y.o.   MRN: [id number]  HPI Follow-up depression - she is on cymbalta 60mg  for the past 2 months.  Doing well on it.  No side effects.  Not seeing a therapist.  She did go once but the therapist was leaving from maternity leave and so never saw her again.  Follow-up insomnia. Still using trazodone for sleep  Takes 100mg .  she says she takes it pretty regularly. She denies any side effects or excess sedation.  Hypertension- Pt denies chest pain, SOB, dizziness, or heart palpitations.  Taking meds as directed w/o problems.  Denies medication side effects.  Has lost 4 pounds over the last 6 months.  She only takes her fluid pill PRN for leg swellin.   Has been through a lot with her hands, shoulders and knees. She is seeing a orthopedist tomorrow for her left shoulder to consider surgery.    Review of Systems     Objective:   Physical Exam  Constitutional: She is oriented to person, place, and time. She appears well-developed and well-nourished.  HENT:  Head: Normocephalic and atraumatic.  Cardiovascular: Normal rate, regular rhythm and normal heart sounds.   Pulmonary/Chest: Effort normal and breath sounds normal.  Neurological: She is alert and oriented to person, place, and time.  Skin: Skin is warm and dry.  Psychiatric: She has a normal mood and affect. Her behavior is normal.          Assessment & Plan:  Depression/anxiety-PHQ 9 score of 2 and Gad 7 score of 1. Early well controlled. Continue current regimen. She's really on Cymbalta more for her back but hopefully will also improve her depression and anxiety as well. Certainly could consider reaching out to a therapist again but she wants to hold off at this time.  Insomnia-we'll refill trazodone. No side effects. She's already well. She is down 200 mg which is fantastic. Hypertension- not well controlled.   Hypertension-uncontrolled. Discussed  restarting hydro chlorothiazide but on a regular basis and so just as needed for swelling. Then check potassium and creatinine within 1 week of taking it daily. Also due for a full CMP and lipids.

## 2014-12-31 DIAGNOSIS — M67911 Unspecified disorder of synovium and tendon, right shoulder: Secondary | ICD-10-CM | POA: Diagnosis not present

## 2015-01-06 DIAGNOSIS — M19012 Primary osteoarthritis, left shoulder: Secondary | ICD-10-CM | POA: Diagnosis not present

## 2015-01-06 DIAGNOSIS — G8918 Other acute postprocedural pain: Secondary | ICD-10-CM | POA: Diagnosis not present

## 2015-01-06 DIAGNOSIS — M7502 Adhesive capsulitis of left shoulder: Secondary | ICD-10-CM | POA: Diagnosis not present

## 2015-01-06 DIAGNOSIS — M75122 Complete rotator cuff tear or rupture of left shoulder, not specified as traumatic: Secondary | ICD-10-CM | POA: Diagnosis not present

## 2015-01-06 DIAGNOSIS — M7542 Impingement syndrome of left shoulder: Secondary | ICD-10-CM | POA: Diagnosis not present

## 2015-01-08 DIAGNOSIS — M25612 Stiffness of left shoulder, not elsewhere classified: Secondary | ICD-10-CM | POA: Diagnosis not present

## 2015-01-08 DIAGNOSIS — M67912 Unspecified disorder of synovium and tendon, left shoulder: Secondary | ICD-10-CM | POA: Diagnosis not present

## 2015-01-08 DIAGNOSIS — M7502 Adhesive capsulitis of left shoulder: Secondary | ICD-10-CM | POA: Diagnosis not present

## 2015-01-09 ENCOUNTER — Telehealth: Payer: Self-pay | Admitting: Family Medicine

## 2015-01-09 DIAGNOSIS — M25612 Stiffness of left shoulder, not elsewhere classified: Secondary | ICD-10-CM | POA: Diagnosis not present

## 2015-01-09 DIAGNOSIS — M67912 Unspecified disorder of synovium and tendon, left shoulder: Secondary | ICD-10-CM | POA: Diagnosis not present

## 2015-01-09 DIAGNOSIS — M7502 Adhesive capsulitis of left shoulder: Secondary | ICD-10-CM | POA: Diagnosis not present

## 2015-01-09 NOTE — Telephone Encounter (Signed)
Patient called left vm that she has veritgo really bad and she is wondering if Dr. Jerilynn Mages can prescribe her anything for it. Thanks [phone]

## 2015-01-12 DIAGNOSIS — M67912 Unspecified disorder of synovium and tendon, left shoulder: Secondary | ICD-10-CM | POA: Diagnosis not present

## 2015-01-12 DIAGNOSIS — M7502 Adhesive capsulitis of left shoulder: Secondary | ICD-10-CM | POA: Diagnosis not present

## 2015-01-12 DIAGNOSIS — M25612 Stiffness of left shoulder, not elsewhere classified: Secondary | ICD-10-CM | POA: Diagnosis not present

## 2015-01-12 MED ORDER — MECLIZINE HCL 12.5 MG PO TABS: 12.5000 mg | ORAL_TABLET | Freq: Three times a day (TID) | ORAL | Status: DC | PRN

## 2015-01-12 NOTE — Telephone Encounter (Signed)
rx sent to [facility]. Call to schedule appt  if not better in one week.

## 2015-01-13 ENCOUNTER — Telehealth: Payer: Self-pay | Admitting: *Deleted

## 2015-01-13 NOTE — Telephone Encounter (Signed)
Called patient & informed that RX has been sent to [facility]. She stated that she has seen her therapist & the doctor which did her surgery. She stated that while with each of them she stood up quickly & got dizzy & both stated that's a inner ear problem. Patient says she has an upcoming visit  &  appointment & she will take with the RX sent in & see Dr Madilyn Fireman then.

## 2015-01-14 DIAGNOSIS — Z9889 Other specified postprocedural states: Secondary | ICD-10-CM | POA: Diagnosis not present

## 2015-01-14 DIAGNOSIS — M67911 Unspecified disorder of synovium and tendon, right shoulder: Secondary | ICD-10-CM | POA: Diagnosis not present

## 2015-01-15 DIAGNOSIS — M25612 Stiffness of left shoulder, not elsewhere classified: Secondary | ICD-10-CM | POA: Diagnosis not present

## 2015-01-15 DIAGNOSIS — M67912 Unspecified disorder of synovium and tendon, left shoulder: Secondary | ICD-10-CM | POA: Diagnosis not present

## 2015-01-15 DIAGNOSIS — M7502 Adhesive capsulitis of left shoulder: Secondary | ICD-10-CM | POA: Diagnosis not present

## 2015-01-19 ENCOUNTER — Encounter: Payer: Self-pay | Admitting: Sports Medicine

## 2015-01-19 ENCOUNTER — Ambulatory Visit (INDEPENDENT_AMBULATORY_CARE_PROVIDER_SITE_OTHER): Payer: Medicare Other | Admitting: Sports Medicine

## 2015-01-19 ENCOUNTER — Ambulatory Visit (INDEPENDENT_AMBULATORY_CARE_PROVIDER_SITE_OTHER): Payer: Medicare Other

## 2015-01-19 VITALS — BP 145/80 | HR 93 | Ht 65.0 in | Wt 211.0 lb

## 2015-01-19 DIAGNOSIS — R768 Other specified abnormal immunological findings in serum: Secondary | ICD-10-CM | POA: Diagnosis not present

## 2015-01-19 DIAGNOSIS — G5603 Carpal tunnel syndrome, bilateral upper limbs: Secondary | ICD-10-CM | POA: Insufficient documentation

## 2015-01-19 DIAGNOSIS — I10 Essential (primary) hypertension: Secondary | ICD-10-CM | POA: Diagnosis not present

## 2015-01-19 DIAGNOSIS — M79641 Pain in right hand: Secondary | ICD-10-CM

## 2015-01-19 DIAGNOSIS — Z9889 Other specified postprocedural states: Secondary | ICD-10-CM | POA: Diagnosis not present

## 2015-01-19 DIAGNOSIS — M5136 Other intervertebral disc degeneration, lumbar region: Secondary | ICD-10-CM

## 2015-01-19 DIAGNOSIS — M51369 Other intervertebral disc degeneration, lumbar region without mention of lumbar back pain or lower extremity pain: Secondary | ICD-10-CM

## 2015-01-19 DIAGNOSIS — M899 Disorder of bone, unspecified: Secondary | ICD-10-CM | POA: Diagnosis not present

## 2015-01-19 DIAGNOSIS — M79642 Pain in left hand: Secondary | ICD-10-CM

## 2015-01-19 DIAGNOSIS — M170 Bilateral primary osteoarthritis of knee: Secondary | ICD-10-CM | POA: Diagnosis not present

## 2015-01-19 DIAGNOSIS — M5032 Other cervical disc degeneration, mid-cervical region: Secondary | ICD-10-CM | POA: Diagnosis not present

## 2015-01-19 DIAGNOSIS — R7689 Other specified abnormal immunological findings in serum: Secondary | ICD-10-CM

## 2015-01-19 MED ORDER — OXYCODONE-ACETAMINOPHEN 5-325 MG PO TABS: 1.0000 | ORAL_TABLET | Freq: Two times a day (BID) | ORAL | Status: DC

## 2015-01-19 NOTE — Progress Notes (Signed)
  Subjective:    CC: Follow-up  HPI: Bilateral knee osteoarthritis: Doing well after injection.  Lumbar radiculopathy: Starting to have a recurrence of pain after an epidural 7 months ago.  Left shoulder pain: Post arthroscopic subacromial decompression and distal clavicular excision, doing well  Hand pain: Bilateral, localized over the radial aspect of the thumb, with occasional numbness and tingling into all fingers, and pain shooting from the shoulder to the arm bilaterally. Slight tremor, and tends to drop things.  Past medical history, Surgical history, Family history not pertinant except as noted below, Social history, Allergies, and medications have been entered into the medical record, reviewed, and no changes needed.   Review of Systems: No fevers, chills, night sweats, weight loss, chest pain, or shortness of breath.   Objective:    General: Well Developed, well nourished, and in no acute distress.  Neuro: Alert and oriented x3, extra-ocular muscles intact, sensation grossly intact.  HEENT: Normocephalic, atraumatic, pupils equal round reactive to light, neck supple, no masses, no lymphadenopathy, thyroid nonpalpable.  Skin: Warm and dry, no rashes. Cardiac: Regular rate and rhythm, no murmurs rubs or gallops, no lower extremity edema.  Respiratory: Clear to auscultation bilaterally. Not using accessory muscles, speaking in full sentences. Bilateral Wrist: Inspection normal with no visible erythema or swelling. ROM smooth and normal with good flexion and extension and ulnar/radial deviation that is symmetrical with opposite wrist. Palpation is normal over metacarpals, navicular, lunate, and TFCC; tendons without tenderness/ swelling No snuffbox tenderness. No tenderness over Canal of [place]. Strength 5/5 in all directions without pain. Negative Finkelstein, tinel's and phalens. Negative Watson's test. Neck: Negative spurling's Full neck range of motion Grip strength and  sensation normal in bilateral hands Strength good C4 to T1 distribution No sensory change to C4 to T1 Reflexes normal  Impression and Recommendations:    I spent 40 minutes with this patient, greater than 50% was face-to-face time counseling regarding the above diagnoses.

## 2015-01-19 NOTE — Assessment & Plan Note (Addendum)
Question bilateral radiculopathy versus carpal tunnel syndrome. She also has a mild tremor. I do think this represents an essential tremor, I am going to set her up for nerve conduction. We are going to x-ray both hands in the neck.

## 2015-01-19 NOTE — Assessment & Plan Note (Signed)
With subacromial decompression and distal clavicular excision. Doing well.

## 2015-01-19 NOTE — Assessment & Plan Note (Signed)
Overall doing well after bilateral injection.

## 2015-01-19 NOTE — Assessment & Plan Note (Signed)
Responded well with an epidural approximately 7 months ago. We have been giving her a lot of steroids recently so we will wait 2 or 3 months before proceeding with another epidural. The most recent one provided 7 months of response. I'm going to add some Percocet for use up to twice a day, I am agreeable to do this on a scheduled basis.

## 2015-01-19 NOTE — Assessment & Plan Note (Signed)
Referral to a local rheumatologist.

## 2015-01-20 DIAGNOSIS — M25612 Stiffness of left shoulder, not elsewhere classified: Secondary | ICD-10-CM | POA: Diagnosis not present

## 2015-01-20 DIAGNOSIS — M67912 Unspecified disorder of synovium and tendon, left shoulder: Secondary | ICD-10-CM | POA: Diagnosis not present

## 2015-01-20 DIAGNOSIS — M7502 Adhesive capsulitis of left shoulder: Secondary | ICD-10-CM | POA: Diagnosis not present

## 2015-01-20 LAB — COMPLETE METABOLIC PANEL WITH GFR
ALBUMIN: 3.7 g/dL (ref 3.5–5.2)
ALK PHOS: 103 U/L (ref 39–117)
ALT: <8 U/L (ref 0–35)
AST: 14 U/L (ref 0–37)
BUN: 9 mg/dL (ref 6–23)
CO2: 29 meq/L (ref 19–32)
Calcium: 10.1 mg/dL (ref 8.4–10.5)
Chloride: 101 mEq/L (ref 96–112)
Creat: 0.62 mg/dL (ref 0.50–1.10)
GLUCOSE: 86 mg/dL (ref 70–99)
POTASSIUM: 4.1 meq/L (ref 3.5–5.3)
Sodium: 140 mEq/L (ref 135–145)
Total Bilirubin: 0.5 mg/dL (ref 0.2–1.2)
Total Protein: 6.9 g/dL (ref 6.0–8.3)

## 2015-01-20 LAB — LIPID PANEL
CHOLESTEROL: 149 mg/dL (ref 0–200)
HDL: 66 mg/dL (ref >=46)
LDL Cholesterol: 66 mg/dL (ref 0–99)
TRIGLYCERIDES: 86 mg/dL (ref <150)
Total CHOL/HDL Ratio: 2.3 Ratio
VLDL: 17 mg/dL (ref 0–40)

## 2015-01-20 LAB — TSH: TSH: 1.098 u[IU]/mL (ref 0.350–4.500)

## 2015-01-22 DIAGNOSIS — M25612 Stiffness of left shoulder, not elsewhere classified: Secondary | ICD-10-CM | POA: Diagnosis not present

## 2015-01-22 DIAGNOSIS — M67912 Unspecified disorder of synovium and tendon, left shoulder: Secondary | ICD-10-CM | POA: Diagnosis not present

## 2015-01-22 DIAGNOSIS — M7502 Adhesive capsulitis of left shoulder: Secondary | ICD-10-CM | POA: Diagnosis not present

## 2015-01-27 DIAGNOSIS — M67912 Unspecified disorder of synovium and tendon, left shoulder: Secondary | ICD-10-CM | POA: Diagnosis not present

## 2015-01-27 DIAGNOSIS — M25612 Stiffness of left shoulder, not elsewhere classified: Secondary | ICD-10-CM | POA: Diagnosis not present

## 2015-01-27 DIAGNOSIS — M7502 Adhesive capsulitis of left shoulder: Secondary | ICD-10-CM | POA: Diagnosis not present

## 2015-01-29 DIAGNOSIS — M25612 Stiffness of left shoulder, not elsewhere classified: Secondary | ICD-10-CM | POA: Diagnosis not present

## 2015-01-29 DIAGNOSIS — M67912 Unspecified disorder of synovium and tendon, left shoulder: Secondary | ICD-10-CM | POA: Diagnosis not present

## 2015-01-29 DIAGNOSIS — M7502 Adhesive capsulitis of left shoulder: Secondary | ICD-10-CM | POA: Diagnosis not present

## 2015-02-02 ENCOUNTER — Telehealth: Payer: Self-pay

## 2015-02-02 MED ORDER — DULOXETINE HCL 60 MG PO CPEP: 60.0000 mg | ORAL_CAPSULE | Freq: Every day | ORAL | Status: DC

## 2015-02-02 NOTE — Telephone Encounter (Signed)
Patient request a refill on Cymbalta 60 mg sent to mail order. Rhonda Cunningham,CMA

## 2015-02-02 NOTE — Telephone Encounter (Signed)
Done

## 2015-02-03 DIAGNOSIS — M7502 Adhesive capsulitis of left shoulder: Secondary | ICD-10-CM | POA: Diagnosis not present

## 2015-02-03 DIAGNOSIS — M67912 Unspecified disorder of synovium and tendon, left shoulder: Secondary | ICD-10-CM | POA: Diagnosis not present

## 2015-02-03 DIAGNOSIS — M25612 Stiffness of left shoulder, not elsewhere classified: Secondary | ICD-10-CM | POA: Diagnosis not present

## 2015-02-05 DIAGNOSIS — M25612 Stiffness of left shoulder, not elsewhere classified: Secondary | ICD-10-CM | POA: Diagnosis not present

## 2015-02-05 DIAGNOSIS — M7502 Adhesive capsulitis of left shoulder: Secondary | ICD-10-CM | POA: Diagnosis not present

## 2015-02-05 DIAGNOSIS — M67912 Unspecified disorder of synovium and tendon, left shoulder: Secondary | ICD-10-CM | POA: Diagnosis not present

## 2015-02-06 ENCOUNTER — Encounter: Payer: Self-pay | Admitting: Sports Medicine

## 2015-02-06 ENCOUNTER — Ambulatory Visit (INDEPENDENT_AMBULATORY_CARE_PROVIDER_SITE_OTHER): Payer: Medicare Other | Admitting: Sports Medicine

## 2015-02-06 VITALS — BP 138/77 | HR 101 | Wt 208.0 lb

## 2015-02-06 DIAGNOSIS — M51369 Other intervertebral disc degeneration, lumbar region without mention of lumbar back pain or lower extremity pain: Secondary | ICD-10-CM

## 2015-02-06 DIAGNOSIS — M170 Bilateral primary osteoarthritis of knee: Secondary | ICD-10-CM

## 2015-02-06 DIAGNOSIS — G894 Chronic pain syndrome: Secondary | ICD-10-CM | POA: Insufficient documentation

## 2015-02-06 DIAGNOSIS — M5136 Other intervertebral disc degeneration, lumbar region: Secondary | ICD-10-CM | POA: Diagnosis not present

## 2015-02-06 MED ORDER — DULOXETINE HCL 30 MG PO CPEP: 30.0000 mg | ORAL_CAPSULE | Freq: Every day | ORAL | Status: DC

## 2015-02-06 NOTE — Assessment & Plan Note (Signed)
Increase Percocet to 3 times a day, eventually we may need to switch her to oxycodone 10 mg 3 times a day. Increasing Cymbalta to 90 mg daily. Return in one month. She is likely proceeding towards pain management.

## 2015-02-06 NOTE — Progress Notes (Addendum)
  Subjective:    CC: Follow-up  HPI: Bilateral knee osteoarthritis: Good response to Depo-Medrol injection in the right knee, unfortunately having a recurrence of pain in the left knee, she has failed Visco supplementation and steroid injections, she does have a recurrence of a fairly tense effusion. Desires interventional treatment today.  Shoulder: Doing well on the left side post arthroscopic subacromial decompression with Dr. Tamera Punt.  Chronic pain: Moderate control with an occasional Percocet prescribed post shoulder arthroscopy, she's had only minimal improvement since increasing to 60 mg of Cymbalta. Denies any depressed mood today.  Past medical history, Surgical history, Family history not pertinant except as noted below, Social history, Allergies, and medications have been entered into the medical record, reviewed, and no changes needed.   Review of Systems: No fevers, chills, night sweats, weight loss, chest pain, or shortness of breath.   Objective:    General: Well Developed, well nourished, and in no acute distress.  Neuro: Alert and oriented x3, extra-ocular muscles intact, sensation grossly intact.  HEENT: Normocephalic, atraumatic, pupils equal round reactive to light, neck supple, no masses, no lymphadenopathy, thyroid nonpalpable.  Skin: Warm and dry, no rashes. Cardiac: Regular rate and rhythm, no murmurs rubs or gallops, no lower extremity edema.  Respiratory: Clear to auscultation bilaterally. Not using accessory muscles, speaking in full sentences. Left Knee: Visible and palpable tense effusion with a fluid wave. ROM normal in flexion and extension and lower leg rotation. Ligaments with solid consistent endpoints including ACL, PCL, LCL, MCL. Negative Mcmurray's and provocative meniscal tests. Non painful patellar compression. Patellar and quadriceps tendons unremarkable. Hamstring and quadriceps strength is normal.  Procedure: Real-time Ultrasound Guided  aspiration of left knee Device: GE Logiq E  Verbal informed consent obtained.  Time-out conducted.  Noted no overlying erythema, induration, or other signs of local infection.  Skin prepped in a sterile fashion.  Local anesthesia: Topical Ethyl chloride.  With sterile technique and under real time ultrasound guidance:  30 mL of cloudy, straw-colored fluid aspirated. Completed without difficulty  Pain immediately resolved suggesting accurate placement of the medication.  Advised to call if fevers/chills, erythema, induration, drainage, or persistent bleeding.  Images permanently stored and available for review in the ultrasound unit.  Impression: Technically successful ultrasound guided injection.  Impression and Recommendations:    I spent 40 minutes with this patient, greater than 50% was face-to-face time counseling regarding the above multiple diagnoses.

## 2015-02-06 NOTE — Assessment & Plan Note (Addendum)
Failed steroid injections and Visco supplementation. Aspiration of the left knee. Fluid was again very cloudy so we will do fluid analysis. MRI. Referral for arthroscopy.

## 2015-02-07 LAB — SYNOVIAL CELL COUNT + DIFF, W/ CRYSTALS
Crystals, Fluid: NONE SEEN
Eosinophils-Synovial: 0 % (ref 0–1)
Lymphocytes-Synovial Fld: 10 % (ref 0–20)
Monocyte/Macrophage: 10 % — ABNORMAL LOW (ref 50–90)
Neutrophil, Synovial: 80 % — ABNORMAL HIGH (ref 0–25)
WBC, Synovial: 27270 cu mm — ABNORMAL HIGH (ref 0–200)

## 2015-02-09 ENCOUNTER — Ambulatory Visit (INDEPENDENT_AMBULATORY_CARE_PROVIDER_SITE_OTHER): Payer: Medicare Other

## 2015-02-09 DIAGNOSIS — M7122 Synovial cyst of popliteal space [Baker], left knee: Secondary | ICD-10-CM

## 2015-02-09 DIAGNOSIS — M25462 Effusion, left knee: Secondary | ICD-10-CM | POA: Diagnosis not present

## 2015-02-09 DIAGNOSIS — S83242A Other tear of medial meniscus, current injury, left knee, initial encounter: Secondary | ICD-10-CM

## 2015-02-09 DIAGNOSIS — S83282A Other tear of lateral meniscus, current injury, left knee, initial encounter: Secondary | ICD-10-CM

## 2015-02-09 DIAGNOSIS — M170 Bilateral primary osteoarthritis of knee: Secondary | ICD-10-CM

## 2015-02-09 DIAGNOSIS — X58XXXA Exposure to other specified factors, initial encounter: Secondary | ICD-10-CM | POA: Diagnosis not present

## 2015-02-10 ENCOUNTER — Encounter: Payer: Self-pay | Admitting: Family Medicine

## 2015-02-10 ENCOUNTER — Ambulatory Visit (INDEPENDENT_AMBULATORY_CARE_PROVIDER_SITE_OTHER): Payer: Medicare Other | Admitting: Family Medicine

## 2015-02-10 VITALS — BP 128/79 | HR 95 | Wt 208.0 lb

## 2015-02-10 DIAGNOSIS — M25532 Pain in left wrist: Secondary | ICD-10-CM

## 2015-02-10 DIAGNOSIS — M329 Systemic lupus erythematosus, unspecified: Secondary | ICD-10-CM | POA: Diagnosis not present

## 2015-02-10 DIAGNOSIS — Z23 Encounter for immunization: Secondary | ICD-10-CM

## 2015-02-10 DIAGNOSIS — G894 Chronic pain syndrome: Secondary | ICD-10-CM | POA: Diagnosis not present

## 2015-02-10 DIAGNOSIS — M25612 Stiffness of left shoulder, not elsewhere classified: Secondary | ICD-10-CM | POA: Diagnosis not present

## 2015-02-10 DIAGNOSIS — I10 Essential (primary) hypertension: Secondary | ICD-10-CM

## 2015-02-10 DIAGNOSIS — M7502 Adhesive capsulitis of left shoulder: Secondary | ICD-10-CM | POA: Diagnosis not present

## 2015-02-10 DIAGNOSIS — M67912 Unspecified disorder of synovium and tendon, left shoulder: Secondary | ICD-10-CM | POA: Diagnosis not present

## 2015-02-10 LAB — BODY FLUID CULTURE
Culture: NO GROWTH
Gram Stain: NONE SEEN
Organism ID, Bacteria: NO GROWTH

## 2015-02-10 NOTE — Progress Notes (Signed)
   Subjective:    Patient ID: Katherine Nixon, female    DOB: 1946/09/02, 69 y.o.   MRN: [id number]  HPI   69 year old female comes in today with her husband. She is sitting in a wheelchair. Normally she walks into the office.  Hypertension- Pt denies chest pain, SOB, dizziness, or heart palpitations.  Taking meds as directed w/o problems.  Denies medication side effects.  Had her inc her HCTZ to daily instead of PRN. She did for a little while but she feel like it was too drying.    Had left shoulder surgery 5 weeks ago with dr. Tamera Punt. She is now in PT in [place]. She is getting better.  Here f/u is tomorrow. She is on oxycondone for pain TID. She is now on cymbalta 90mg  as well.   Had MRI on the left knee yesterday. She says she's been referred to an orthopedist for possible arthroscopy. She is also having pain in her right shoulder and her right knee but really hasn't address those yet.  Chronic pain syndrome-she is taking Cymbalta 90 mg and tolerating it well without any significant side effects. She does feel like it's been helpful for her.   Review of Systems     Objective:   Physical Exam  Constitutional: She is oriented to person, place, and time. She appears well-developed and well-nourished.  HENT:  Head: Normocephalic and atraumatic.  Cardiovascular: Normal rate, regular rhythm and normal heart sounds.   Pulmonary/Chest: Effort normal and breath sounds normal.  Neurological: She is alert and oriented to person, place, and time.  Skin: Skin is warm and dry.  Psychiatric: She has a normal mood and affect. Her behavior is normal.          Assessment & Plan:  HTN - well controlled. She's not on the diuretic her blood pressure looks absolutely fantastic today. Continue current regimen. Follow back up in 4 months just to make sure she continues to stay well controlled. She has lost a couple of pounds a certainly this could be contributing.  Left wrist pain-she's actually  scheduled for nerve conduction studies later this week. She had some x-rays which showed some arthritis. Next  Chronic pain syndrome-she's currently on Cymbalta 90 mg and does feel like it's being helpful. She's also on chronic pain management at this point. We discussed the pros and cons of chronic pain management. Certainly the goal is to reduce her pain so the increase functionality but it will not make her pain-free.  Lupus-followed by rheumatology. She is trying ot transfer to someone locally.  Prevnar 13 given today.

## 2015-02-11 DIAGNOSIS — S83207A Unspecified tear of unspecified meniscus, current injury, left knee, initial encounter: Secondary | ICD-10-CM | POA: Diagnosis not present

## 2015-02-11 DIAGNOSIS — Z9889 Other specified postprocedural states: Secondary | ICD-10-CM | POA: Diagnosis not present

## 2015-02-11 NOTE — Addendum Note (Signed)
Addended by: Teddy Spike on: 02/11/2015 11:59 AM   Modules accepted: Orders

## 2015-02-12 DIAGNOSIS — G603 Idiopathic progressive neuropathy: Secondary | ICD-10-CM | POA: Diagnosis not present

## 2015-02-12 DIAGNOSIS — G5601 Carpal tunnel syndrome, right upper limb: Secondary | ICD-10-CM | POA: Diagnosis not present

## 2015-02-12 DIAGNOSIS — G5622 Lesion of ulnar nerve, left upper limb: Secondary | ICD-10-CM | POA: Diagnosis not present

## 2015-02-12 DIAGNOSIS — G5602 Carpal tunnel syndrome, left upper limb: Secondary | ICD-10-CM | POA: Diagnosis not present

## 2015-02-12 DIAGNOSIS — G5621 Lesion of ulnar nerve, right upper limb: Secondary | ICD-10-CM | POA: Diagnosis not present

## 2015-02-12 DIAGNOSIS — M79642 Pain in left hand: Secondary | ICD-10-CM | POA: Diagnosis not present

## 2015-02-12 DIAGNOSIS — M79641 Pain in right hand: Secondary | ICD-10-CM | POA: Diagnosis not present

## 2015-02-17 DIAGNOSIS — M25612 Stiffness of left shoulder, not elsewhere classified: Secondary | ICD-10-CM | POA: Diagnosis not present

## 2015-02-17 DIAGNOSIS — M7502 Adhesive capsulitis of left shoulder: Secondary | ICD-10-CM | POA: Diagnosis not present

## 2015-02-17 DIAGNOSIS — M67912 Unspecified disorder of synovium and tendon, left shoulder: Secondary | ICD-10-CM | POA: Diagnosis not present

## 2015-02-19 DIAGNOSIS — M67912 Unspecified disorder of synovium and tendon, left shoulder: Secondary | ICD-10-CM | POA: Diagnosis not present

## 2015-02-19 DIAGNOSIS — M25612 Stiffness of left shoulder, not elsewhere classified: Secondary | ICD-10-CM | POA: Diagnosis not present

## 2015-02-19 DIAGNOSIS — M7502 Adhesive capsulitis of left shoulder: Secondary | ICD-10-CM | POA: Diagnosis not present

## 2015-02-20 ENCOUNTER — Encounter: Payer: Self-pay | Admitting: Sports Medicine

## 2015-02-23 ENCOUNTER — Ambulatory Visit (INDEPENDENT_AMBULATORY_CARE_PROVIDER_SITE_OTHER): Payer: Medicare Other | Admitting: Sports Medicine

## 2015-02-23 ENCOUNTER — Encounter: Payer: Self-pay | Admitting: Sports Medicine

## 2015-02-23 VITALS — BP 143/83 | HR 100 | Wt 204.0 lb

## 2015-02-23 DIAGNOSIS — G5603 Carpal tunnel syndrome, bilateral upper limbs: Secondary | ICD-10-CM

## 2015-02-23 DIAGNOSIS — M170 Bilateral primary osteoarthritis of knee: Secondary | ICD-10-CM | POA: Diagnosis not present

## 2015-02-23 DIAGNOSIS — G5602 Carpal tunnel syndrome, left upper limb: Secondary | ICD-10-CM | POA: Diagnosis not present

## 2015-02-23 DIAGNOSIS — R7689 Other specified abnormal immunological findings in serum: Secondary | ICD-10-CM

## 2015-02-23 DIAGNOSIS — G5601 Carpal tunnel syndrome, right upper limb: Secondary | ICD-10-CM | POA: Diagnosis not present

## 2015-02-23 DIAGNOSIS — M25511 Pain in right shoulder: Secondary | ICD-10-CM

## 2015-02-23 DIAGNOSIS — R768 Other specified abnormal immunological findings in serum: Secondary | ICD-10-CM

## 2015-02-23 DIAGNOSIS — G894 Chronic pain syndrome: Secondary | ICD-10-CM

## 2015-02-23 MED ORDER — OXYCODONE HCL 10 MG PO TABS: ORAL_TABLET | ORAL | Status: DC

## 2015-02-23 MED ORDER — AMBULATORY NON FORMULARY MEDICATION: Status: DC

## 2015-02-23 NOTE — Addendum Note (Signed)
Addended by: Silverio Decamp on: 02/23/2015 08:02 PM   Modules accepted: Level of Service

## 2015-02-23 NOTE — Assessment & Plan Note (Signed)
Awaiting referral to Dr. Estanislado Pandy

## 2015-02-23 NOTE — Assessment & Plan Note (Signed)
Increasing oxycodone to 10 mg up to 3 times a day. Discontinue Percocet.

## 2015-02-23 NOTE — Assessment & Plan Note (Signed)
bilateral median nerve hydrodissection as above. Return in one month.

## 2015-02-23 NOTE — Assessment & Plan Note (Signed)
Pain is referable today to the acromioclavicular joint. Injection as above.

## 2015-02-23 NOTE — Progress Notes (Signed)
Subjective:    CC: follow-up  HPI: Bilateral hand numbness and tingling: No improvement with extension splinting, she was able to see the neurologist who she was not very impressed with. 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 She is amenable to interventional treatment today.  Right shoulder pain: Tender to palpation over the top of the shoulder, she saw Dr. Tamera Punt for this, and he advised her to have me inject this under ultrasound guidance. I do suspect he met her acromioclavicular joint.  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 Dr. Tamera Punt has her scheduled for operative knee arthroscopy tomorrow morning. She does need some more pain medication, and also desires a 2 wheeled walker.  She did tell me she was having some diarrhea, I have asked her to follow up with her PCP regarding this  Past medical history, Surgical history, Family history not pertinant except as noted below, Social history, Allergies, and medications have been entered into the medical record, reviewed, and no changes needed.   Review of Systems: No fevers, chills, night sweats, weight loss, chest pain, or shortness of breath.   Objective:    General: Well Developed, well nourished, and in no acute distress.  Neuro: Alert and oriented x3, extra-ocular muscles intact, sensation grossly intact.  HEENT: Normocephalic, atraumatic, pupils equal round reactive to light, neck supple, no masses, no lymphadenopathy, thyroid nonpalpable.  Skin: Warm and dry, no rashes. Cardiac: Regular rate and rhythm, no murmurs rubs or gallops, no lower extremity edema.  Respiratory: Clear to auscultation bilaterally. Not  using accessory muscles, speaking in full sentences. Right Shoulder: Inspection reveals no abnormalities, atrophy or asymmetry. Tender to palpation over the acromioclavicular joint.  Procedure: Real-time Ultrasound Guided Injection of right acromioclavicular joint Device: GE Logiq E  Verbal informed consent obtained.  Time-out conducted.  Noted no overlying erythema, induration, or other signs of local infection.  Skin prepped in a sterile fashion.  Local anesthesia: Topical Ethyl chloride.  With sterile technique and under real time ultrasound guidance:  25-gauge needle advanced into the joint which was highly arthritic with an effusion, 0.5 mL kenalog 40, 0.5 mL lidocaine injected easily. Completed without difficulty  Pain immediately resolved suggesting accurate placement of the medication.  Advised to call if fevers/chills, erythema, induration, drainage, or persistent bleeding.  Images permanently stored and available for review in the ultrasound unit.  Impression: Technically successful ultrasound guided injection.  Procedure: Real-time Ultrasound Guided left median nerve hydrodissection at the carpal tunnel. Device: GE Logiq E  Verbal informed consent obtained.  Time-out conducted.  Noted no overlying erythema, induration, or other signs of local infection.  Skin prepped in a sterile fashion.  Local anesthesia: Topical Ethyl chloride.  With sterile technique and under real time ultrasound guidance:  Visualized the median nerve in the carpal tunnel, using a 25-gauge needle I injected medicine both superficial to and deep to the median nerve freeing it from surrounding structures, active care to avoid intraneural injection, so medication was also placed deep in to the carpal tunnel for a total of 1 mL kenalog 40 and 4 mL lidocaine. Completed without difficulty  Pain immediately resolved suggesting accurate placement of the medication.  Advised to call if fevers/chills, erythema,  induration, drainage, or persistent  bleeding.  Images permanently stored and available for review in the ultrasound unit.  Impression: Technically successful ultrasound guided injection.  Procedure: Real-time Ultrasound Guided right median nerve hydrodissection at the carpal tunnel. Device: GE Logiq E  Verbal informed consent obtained.  Time-out conducted.  Noted no overlying erythema, induration, or other signs of local infection.  Skin prepped in a sterile fashion.  Local anesthesia: Topical Ethyl chloride.  With sterile technique and under real time ultrasound guidance:  Visualized the median nerve in the carpal tunnel, using a 25-gauge needle I injected medicine both superficial to and deep to the median nerve freeing it from surrounding structures, active care to avoid intraneural injection, so medication was also placed deep in to the carpal tunnel for a total of 1 mL kenalog 40 and 4 mL lidocaine. Completed without difficulty  Pain immediately resolved suggesting accurate placement of the medication.  Advised to call if fevers/chills, erythema, induration, drainage, or persistent bleeding.  Images permanently stored and available for review in the ultrasound unit.  Impression: Technically successful ultrasound guided injection.  Impression and Recommendations:    I spent 40 minutes with this patient, greater than 50% was face-to-face counseling regarding the above multiple diagnoses and separate from the time doing her procedures.

## 2015-02-23 NOTE — Assessment & Plan Note (Signed)
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 Has an arthroscopy coming up tomorrow.

## 2015-02-24 DIAGNOSIS — S83242A Other tear of medial meniscus, current injury, left knee, initial encounter: Secondary | ICD-10-CM | POA: Diagnosis not present

## 2015-02-24 DIAGNOSIS — M23332 Other meniscus derangements, other medial meniscus, left knee: Secondary | ICD-10-CM | POA: Diagnosis not present

## 2015-02-24 DIAGNOSIS — M659 Synovitis and tenosynovitis, unspecified: Secondary | ICD-10-CM | POA: Diagnosis not present

## 2015-02-24 DIAGNOSIS — M94262 Chondromalacia, left knee: Secondary | ICD-10-CM | POA: Diagnosis not present

## 2015-02-24 DIAGNOSIS — M23362 Other meniscus derangements, other lateral meniscus, left knee: Secondary | ICD-10-CM | POA: Diagnosis not present

## 2015-02-24 DIAGNOSIS — S83282A Other tear of lateral meniscus, current injury, left knee, initial encounter: Secondary | ICD-10-CM | POA: Diagnosis not present

## 2015-03-04 DIAGNOSIS — M94262 Chondromalacia, left knee: Secondary | ICD-10-CM | POA: Diagnosis not present

## 2015-03-04 DIAGNOSIS — M25662 Stiffness of left knee, not elsewhere classified: Secondary | ICD-10-CM | POA: Diagnosis not present

## 2015-03-04 DIAGNOSIS — M25562 Pain in left knee: Secondary | ICD-10-CM | POA: Diagnosis not present

## 2015-03-04 DIAGNOSIS — Z9889 Other specified postprocedural states: Secondary | ICD-10-CM | POA: Diagnosis not present

## 2015-03-04 DIAGNOSIS — R262 Difficulty in walking, not elsewhere classified: Secondary | ICD-10-CM | POA: Diagnosis not present

## 2015-03-09 LAB — FUNGUS CULTURE W SMEAR: Smear Result: NONE SEEN

## 2015-03-11 DIAGNOSIS — R262 Difficulty in walking, not elsewhere classified: Secondary | ICD-10-CM | POA: Diagnosis not present

## 2015-03-11 DIAGNOSIS — M94262 Chondromalacia, left knee: Secondary | ICD-10-CM | POA: Diagnosis not present

## 2015-03-11 DIAGNOSIS — M25662 Stiffness of left knee, not elsewhere classified: Secondary | ICD-10-CM | POA: Diagnosis not present

## 2015-03-11 DIAGNOSIS — M25562 Pain in left knee: Secondary | ICD-10-CM | POA: Diagnosis not present

## 2015-03-13 ENCOUNTER — Encounter: Payer: Self-pay | Admitting: Sports Medicine

## 2015-03-18 DIAGNOSIS — M94262 Chondromalacia, left knee: Secondary | ICD-10-CM | POA: Diagnosis not present

## 2015-03-18 DIAGNOSIS — M25662 Stiffness of left knee, not elsewhere classified: Secondary | ICD-10-CM | POA: Diagnosis not present

## 2015-03-18 DIAGNOSIS — M25562 Pain in left knee: Secondary | ICD-10-CM | POA: Diagnosis not present

## 2015-03-18 DIAGNOSIS — R262 Difficulty in walking, not elsewhere classified: Secondary | ICD-10-CM | POA: Diagnosis not present

## 2015-03-22 LAB — AFB CULTURE WITH SMEAR (NOT AT ARMC): Acid Fast Smear: NONE SEEN

## 2015-03-23 ENCOUNTER — Ambulatory Visit: Payer: Medicare Other | Admitting: Sports Medicine

## 2015-03-23 DIAGNOSIS — R768 Other specified abnormal immunological findings in serum: Secondary | ICD-10-CM | POA: Diagnosis not present

## 2015-03-23 LAB — CBC AND DIFFERENTIAL
HCT: 43 % (ref 36–46)
HEMOGLOBIN: 13.8 g/dL (ref 12.0–16.0)
WBC: 6 10*3/mL

## 2015-03-23 LAB — BASIC METABOLIC PANEL: CREATININE: 0.7 mg/dL (ref 0.5–1.1)

## 2015-03-23 LAB — SEDIMENTATION RATE: Sed Rate: 52

## 2015-03-25 DIAGNOSIS — M25662 Stiffness of left knee, not elsewhere classified: Secondary | ICD-10-CM | POA: Diagnosis not present

## 2015-03-25 DIAGNOSIS — R262 Difficulty in walking, not elsewhere classified: Secondary | ICD-10-CM | POA: Diagnosis not present

## 2015-03-25 DIAGNOSIS — M94262 Chondromalacia, left knee: Secondary | ICD-10-CM | POA: Diagnosis not present

## 2015-03-25 DIAGNOSIS — M25562 Pain in left knee: Secondary | ICD-10-CM | POA: Diagnosis not present

## 2015-03-26 ENCOUNTER — Ambulatory Visit (INDEPENDENT_AMBULATORY_CARE_PROVIDER_SITE_OTHER): Payer: Medicare Other | Admitting: Sports Medicine

## 2015-03-26 ENCOUNTER — Encounter: Payer: Self-pay | Admitting: Sports Medicine

## 2015-03-26 VITALS — BP 128/70 | HR 84 | Ht 65.0 in | Wt 194.0 lb

## 2015-03-26 DIAGNOSIS — G5601 Carpal tunnel syndrome, right upper limb: Secondary | ICD-10-CM | POA: Diagnosis not present

## 2015-03-26 DIAGNOSIS — M329 Systemic lupus erythematosus, unspecified: Secondary | ICD-10-CM

## 2015-03-26 DIAGNOSIS — R7689 Other specified abnormal immunological findings in serum: Secondary | ICD-10-CM

## 2015-03-26 DIAGNOSIS — R768 Other specified abnormal immunological findings in serum: Secondary | ICD-10-CM | POA: Diagnosis not present

## 2015-03-26 DIAGNOSIS — M170 Bilateral primary osteoarthritis of knee: Secondary | ICD-10-CM | POA: Diagnosis not present

## 2015-03-26 DIAGNOSIS — G5602 Carpal tunnel syndrome, left upper limb: Secondary | ICD-10-CM | POA: Diagnosis not present

## 2015-03-26 DIAGNOSIS — G5603 Carpal tunnel syndrome, bilateral upper limbs: Secondary | ICD-10-CM

## 2015-03-26 MED ORDER — MELOXICAM 15 MG PO TABS: ORAL_TABLET | ORAL | Status: DC

## 2015-03-26 NOTE — Assessment & Plan Note (Signed)
We will attempt to recontact rheumatology to get patient in.

## 2015-03-26 NOTE — Assessment & Plan Note (Signed)
Still has some pain at the trapeziometacarpal joint but carpal tunnel symptoms have resolved after bilateral hydrodissection.

## 2015-03-26 NOTE — Assessment & Plan Note (Signed)
Doing well after left knee arthroscopy. Right knee aspirated today, no injection. Continue oxycodone, seems to be working.

## 2015-03-26 NOTE — Progress Notes (Signed)
  Subjective:    CC: follow-up  HPI: Carpal tunnel syndrome: Resolved now after bilateral median nerve hydrodissection, she continues to have some pain referable to the trapeziometacarpal joint.  Shoulder pain: Better after acromial clavicular injection, still having some pain.  Bilateral knee osteoarthritis: Left side is doing well after her arthroscopy, right side is still hurting, and she desires an aspiration today. She has been doing oxycodone, and only took her first 10 mg pill today, happy with results.  Positive ANA: Confirmatory tests were negative however considering elevated ESR, she likely is somewhere in the gray area between lupus, and normal. Unfortunately she was turned down by rheumatology, it seems as though they suspected she simply had osteoarthritis.  Past medical history, Surgical history, Family history not pertinant except as noted below, Social history, Allergies, and medications have been entered into the medical record, reviewed, and no changes needed.   Review of Systems: No fevers, chills, night sweats, weight loss, chest pain, or shortness of breath.   Objective:    General: Well Developed, well nourished, and in no acute distress.  Neuro: Alert and oriented x3, extra-ocular muscles intact, sensation grossly intact.  HEENT: Normocephalic, atraumatic, pupils equal round reactive to light, neck supple, no masses, no lymphadenopathy, thyroid nonpalpable.  Skin: Warm and dry, no rashes. Cardiac: Regular rate and rhythm, no murmurs rubs or gallops, no lower extremity edema.  Respiratory: Clear to auscultation bilaterally. Not using accessory muscles, speaking in full sentences. Right Knee: Visibly swollen with a palpable fluid wave ROM normal in flexion and extension and lower leg rotation. Ligaments with solid consistent endpoints including ACL, PCL, LCL, MCL. Negative Mcmurray's and provocative meniscal tests. Non painful patellar compression. Patellar and  quadriceps tendons unremarkable. Hamstring and quadriceps strength is normal.  Procedure: Real-time Ultrasound Guided aspiration of right knee Device: GE Logiq E  Verbal informed consent obtained.  Time-out conducted.  Noted no overlying erythema, induration, or other signs of local infection.  Skin prepped in a sterile fashion.  Local anesthesia: Topical Ethyl chloride.  With sterile technique and under real time ultrasound guidance:  20 mL of cloudy fluid, straw-colored aspirated. Completed without difficulty  Pain immediately resolved suggesting accurate placement of the medication.  Advised to call if fevers/chills, erythema, induration, drainage, or persistent bleeding.  Images permanently stored and available for review in the ultrasound unit.  Impression: Technically successful ultrasound guided injection.  Impression and Recommendations:    I spent 40 minutes with this patient, greater than 50% was face-to-face time counseling regarding the above diagnoses

## 2015-03-26 NOTE — Assessment & Plan Note (Signed)
I need to call Dr. Estanislado Pandy with rheumatology, to facilitate her referral.

## 2015-04-01 DIAGNOSIS — M25662 Stiffness of left knee, not elsewhere classified: Secondary | ICD-10-CM | POA: Diagnosis not present

## 2015-04-01 DIAGNOSIS — R262 Difficulty in walking, not elsewhere classified: Secondary | ICD-10-CM | POA: Diagnosis not present

## 2015-04-01 DIAGNOSIS — Z9889 Other specified postprocedural states: Secondary | ICD-10-CM | POA: Diagnosis not present

## 2015-04-01 DIAGNOSIS — S83206D Unspecified tear of unspecified meniscus, current injury, right knee, subsequent encounter: Secondary | ICD-10-CM | POA: Diagnosis not present

## 2015-04-01 DIAGNOSIS — M25562 Pain in left knee: Secondary | ICD-10-CM | POA: Diagnosis not present

## 2015-04-01 DIAGNOSIS — M94262 Chondromalacia, left knee: Secondary | ICD-10-CM | POA: Diagnosis not present

## 2015-04-16 DIAGNOSIS — S83241A Other tear of medial meniscus, current injury, right knee, initial encounter: Secondary | ICD-10-CM | POA: Diagnosis not present

## 2015-04-16 DIAGNOSIS — S83281A Other tear of lateral meniscus, current injury, right knee, initial encounter: Secondary | ICD-10-CM | POA: Diagnosis not present

## 2015-04-16 DIAGNOSIS — M958 Other specified acquired deformities of musculoskeletal system: Secondary | ICD-10-CM | POA: Diagnosis not present

## 2015-04-16 DIAGNOSIS — Y929 Unspecified place or not applicable: Secondary | ICD-10-CM | POA: Diagnosis not present

## 2015-04-16 DIAGNOSIS — X58XXXA Exposure to other specified factors, initial encounter: Secondary | ICD-10-CM | POA: Diagnosis not present

## 2015-04-16 DIAGNOSIS — M94261 Chondromalacia, right knee: Secondary | ICD-10-CM | POA: Diagnosis not present

## 2015-04-16 DIAGNOSIS — M23351 Other meniscus derangements, posterior horn of lateral meniscus, right knee: Secondary | ICD-10-CM | POA: Diagnosis not present

## 2015-04-23 ENCOUNTER — Encounter: Payer: Self-pay | Admitting: Sports Medicine

## 2015-04-23 ENCOUNTER — Ambulatory Visit (INDEPENDENT_AMBULATORY_CARE_PROVIDER_SITE_OTHER): Payer: Medicare Other | Admitting: Sports Medicine

## 2015-04-23 VITALS — BP 133/75 | HR 92 | Ht 65.0 in | Wt 193.0 lb

## 2015-04-23 DIAGNOSIS — T380X5A Adverse effect of glucocorticoids and synthetic analogues, initial encounter: Principal | ICD-10-CM

## 2015-04-23 DIAGNOSIS — M25561 Pain in right knee: Secondary | ICD-10-CM | POA: Diagnosis not present

## 2015-04-23 DIAGNOSIS — E2740 Unspecified adrenocortical insufficiency: Secondary | ICD-10-CM | POA: Diagnosis not present

## 2015-04-23 DIAGNOSIS — M25661 Stiffness of right knee, not elsewhere classified: Secondary | ICD-10-CM | POA: Diagnosis not present

## 2015-04-23 DIAGNOSIS — Z9889 Other specified postprocedural states: Secondary | ICD-10-CM | POA: Diagnosis not present

## 2015-04-23 DIAGNOSIS — G894 Chronic pain syndrome: Secondary | ICD-10-CM | POA: Diagnosis not present

## 2015-04-23 DIAGNOSIS — R262 Difficulty in walking, not elsewhere classified: Secondary | ICD-10-CM | POA: Diagnosis not present

## 2015-04-23 MED ORDER — OXYCODONE-ACETAMINOPHEN 5-325 MG PO TABS: 1.0000 | ORAL_TABLET | Freq: Three times a day (TID) | ORAL | Status: DC | PRN

## 2015-04-23 NOTE — Progress Notes (Signed)
  Subjective:    CC: Follow-up  HPI: Katherine Nixon returns to discuss her multiple issues, shoulders are doing well, left side is post arthroscopy, right side is starting to have some pain but she does not desire any specific treatment today.  Knees have been scoped bilaterally, overall doing well.  Carpal tunnel symptoms have resolved after median nerve hydrodissection. She still has some thumb basal joint pain.  Fatigue: She's had multiple steroidal injections, feels highly fatigued, wonders what may be causing this.  Past medical history, Surgical history, Family history not pertinant except as noted below, Social history, Allergies, and medications have been entered into the medical record, reviewed, and no changes needed.   Review of Systems: No fevers, chills, night sweats, weight loss, chest pain, or shortness of breath.   Objective:    General: Well Developed, well nourished, and in no acute distress.  Neuro: Alert and oriented x3, extra-ocular muscles intact, sensation grossly intact.  HEENT: Normocephalic, atraumatic, pupils equal round reactive to light, neck supple, no masses, no lymphadenopathy, thyroid nonpalpable.  Skin: Warm and dry, no rashes. Cardiac: Regular rate and rhythm, no murmurs rubs or gallops, no lower extremity edema.  Respiratory: Clear to auscultation bilaterally. Not using accessory muscles, speaking in full sentences.  Impression and Recommendations:

## 2015-04-23 NOTE — Assessment & Plan Note (Signed)
Overall doing well, she is relatively tired and she has been getting a number of steroidal injection so I am going to check cortisol levels. We are also going to recheck her liver function tests, if liver function has normalized we can continue with Percocet, I think oxycodone 10 mg was a bit much for her. We will be checking a random cortisol today, if no evidence of adrenal suppression we will probably follow this up with a morning cortisol.

## 2015-04-24 DIAGNOSIS — Z9889 Other specified postprocedural states: Secondary | ICD-10-CM | POA: Diagnosis not present

## 2015-04-24 LAB — COMPREHENSIVE METABOLIC PANEL WITH GFR
ALT: 53 U/L — ABNORMAL HIGH (ref 0–35)
Albumin: 3.2 g/dL — ABNORMAL LOW (ref 3.5–5.2)
Calcium: 9.7 mg/dL (ref 8.4–10.5)
Glucose, Bld: 157 mg/dL — ABNORMAL HIGH (ref 70–99)
Total Bilirubin: 0.5 mg/dL (ref 0.2–1.2)

## 2015-04-24 LAB — COMPREHENSIVE METABOLIC PANEL
AST: 59 U/L — ABNORMAL HIGH (ref 0–37)
Alkaline Phosphatase: 184 U/L — ABNORMAL HIGH (ref 39–117)
BUN: 13 mg/dL (ref 6–23)
CO2: 29 mEq/L (ref 19–32)
Chloride: 98 mEq/L (ref 96–112)
Creat: 0.61 mg/dL (ref 0.50–1.10)
Potassium: 4.3 mEq/L (ref 3.5–5.3)
Sodium: 137 mEq/L (ref 135–145)
Total Protein: 6.9 g/dL (ref 6.0–8.3)

## 2015-04-29 DIAGNOSIS — M25561 Pain in right knee: Secondary | ICD-10-CM | POA: Diagnosis not present

## 2015-04-29 DIAGNOSIS — Z9889 Other specified postprocedural states: Secondary | ICD-10-CM | POA: Diagnosis not present

## 2015-04-29 DIAGNOSIS — R262 Difficulty in walking, not elsewhere classified: Secondary | ICD-10-CM | POA: Diagnosis not present

## 2015-04-29 DIAGNOSIS — M25661 Stiffness of right knee, not elsewhere classified: Secondary | ICD-10-CM | POA: Diagnosis not present

## 2015-04-29 LAB — CORTISOL, FREE: Cortisol Free, Ser: 0.36 ug/dL

## 2015-04-30 DIAGNOSIS — H8112 Benign paroxysmal vertigo, left ear: Secondary | ICD-10-CM | POA: Diagnosis not present

## 2015-04-30 DIAGNOSIS — R262 Difficulty in walking, not elsewhere classified: Secondary | ICD-10-CM | POA: Diagnosis not present

## 2015-04-30 DIAGNOSIS — Z9889 Other specified postprocedural states: Secondary | ICD-10-CM | POA: Diagnosis not present

## 2015-04-30 DIAGNOSIS — M25561 Pain in right knee: Secondary | ICD-10-CM | POA: Diagnosis not present

## 2015-04-30 DIAGNOSIS — M25661 Stiffness of right knee, not elsewhere classified: Secondary | ICD-10-CM | POA: Diagnosis not present

## 2015-04-30 DIAGNOSIS — H903 Sensorineural hearing loss, bilateral: Secondary | ICD-10-CM | POA: Diagnosis not present

## 2015-05-05 DIAGNOSIS — Z9889 Other specified postprocedural states: Secondary | ICD-10-CM | POA: Diagnosis not present

## 2015-05-05 DIAGNOSIS — M25661 Stiffness of right knee, not elsewhere classified: Secondary | ICD-10-CM | POA: Diagnosis not present

## 2015-05-05 DIAGNOSIS — M25561 Pain in right knee: Secondary | ICD-10-CM | POA: Diagnosis not present

## 2015-05-05 DIAGNOSIS — R262 Difficulty in walking, not elsewhere classified: Secondary | ICD-10-CM | POA: Diagnosis not present

## 2015-05-07 DIAGNOSIS — Z9889 Other specified postprocedural states: Secondary | ICD-10-CM | POA: Diagnosis not present

## 2015-05-07 DIAGNOSIS — M25661 Stiffness of right knee, not elsewhere classified: Secondary | ICD-10-CM | POA: Diagnosis not present

## 2015-05-07 DIAGNOSIS — R262 Difficulty in walking, not elsewhere classified: Secondary | ICD-10-CM | POA: Diagnosis not present

## 2015-05-07 DIAGNOSIS — M25561 Pain in right knee: Secondary | ICD-10-CM | POA: Diagnosis not present

## 2015-05-12 DIAGNOSIS — Z9889 Other specified postprocedural states: Secondary | ICD-10-CM | POA: Diagnosis not present

## 2015-05-12 DIAGNOSIS — R262 Difficulty in walking, not elsewhere classified: Secondary | ICD-10-CM | POA: Diagnosis not present

## 2015-05-12 DIAGNOSIS — M25661 Stiffness of right knee, not elsewhere classified: Secondary | ICD-10-CM | POA: Diagnosis not present

## 2015-05-12 DIAGNOSIS — M25561 Pain in right knee: Secondary | ICD-10-CM | POA: Diagnosis not present

## 2015-05-14 DIAGNOSIS — R262 Difficulty in walking, not elsewhere classified: Secondary | ICD-10-CM | POA: Diagnosis not present

## 2015-05-14 DIAGNOSIS — M25561 Pain in right knee: Secondary | ICD-10-CM | POA: Diagnosis not present

## 2015-05-14 DIAGNOSIS — Z9889 Other specified postprocedural states: Secondary | ICD-10-CM | POA: Diagnosis not present

## 2015-05-14 DIAGNOSIS — M25661 Stiffness of right knee, not elsewhere classified: Secondary | ICD-10-CM | POA: Diagnosis not present

## 2015-05-19 DIAGNOSIS — R262 Difficulty in walking, not elsewhere classified: Secondary | ICD-10-CM | POA: Diagnosis not present

## 2015-05-19 DIAGNOSIS — Z9889 Other specified postprocedural states: Secondary | ICD-10-CM | POA: Diagnosis not present

## 2015-05-19 DIAGNOSIS — M25661 Stiffness of right knee, not elsewhere classified: Secondary | ICD-10-CM | POA: Diagnosis not present

## 2015-05-19 DIAGNOSIS — M25561 Pain in right knee: Secondary | ICD-10-CM | POA: Diagnosis not present

## 2015-05-21 DIAGNOSIS — R262 Difficulty in walking, not elsewhere classified: Secondary | ICD-10-CM | POA: Diagnosis not present

## 2015-05-21 DIAGNOSIS — M25661 Stiffness of right knee, not elsewhere classified: Secondary | ICD-10-CM | POA: Diagnosis not present

## 2015-05-21 DIAGNOSIS — Z9889 Other specified postprocedural states: Secondary | ICD-10-CM | POA: Diagnosis not present

## 2015-05-21 DIAGNOSIS — M25561 Pain in right knee: Secondary | ICD-10-CM | POA: Diagnosis not present

## 2015-06-02 DIAGNOSIS — R262 Difficulty in walking, not elsewhere classified: Secondary | ICD-10-CM | POA: Diagnosis not present

## 2015-06-02 DIAGNOSIS — M25661 Stiffness of right knee, not elsewhere classified: Secondary | ICD-10-CM | POA: Diagnosis not present

## 2015-06-02 DIAGNOSIS — Z9889 Other specified postprocedural states: Secondary | ICD-10-CM | POA: Diagnosis not present

## 2015-06-02 DIAGNOSIS — M25561 Pain in right knee: Secondary | ICD-10-CM | POA: Diagnosis not present

## 2015-06-04 DIAGNOSIS — R262 Difficulty in walking, not elsewhere classified: Secondary | ICD-10-CM | POA: Diagnosis not present

## 2015-06-04 DIAGNOSIS — M25661 Stiffness of right knee, not elsewhere classified: Secondary | ICD-10-CM | POA: Diagnosis not present

## 2015-06-04 DIAGNOSIS — Z9889 Other specified postprocedural states: Secondary | ICD-10-CM | POA: Diagnosis not present

## 2015-06-04 DIAGNOSIS — M25561 Pain in right knee: Secondary | ICD-10-CM | POA: Diagnosis not present

## 2015-06-09 ENCOUNTER — Other Ambulatory Visit: Payer: Self-pay | Admitting: Family Medicine

## 2015-06-10 DIAGNOSIS — Z9889 Other specified postprocedural states: Secondary | ICD-10-CM | POA: Diagnosis not present

## 2015-06-10 DIAGNOSIS — M25561 Pain in right knee: Secondary | ICD-10-CM | POA: Diagnosis not present

## 2015-06-12 ENCOUNTER — Ambulatory Visit (INDEPENDENT_AMBULATORY_CARE_PROVIDER_SITE_OTHER): Payer: Medicare Other | Admitting: Family Medicine

## 2015-06-12 ENCOUNTER — Encounter: Payer: Self-pay | Admitting: Family Medicine

## 2015-06-12 VITALS — BP 125/71 | HR 74 | Ht 65.0 in | Wt 188.0 lb

## 2015-06-12 DIAGNOSIS — I10 Essential (primary) hypertension: Secondary | ICD-10-CM

## 2015-06-12 DIAGNOSIS — G4700 Insomnia, unspecified: Secondary | ICD-10-CM

## 2015-06-12 DIAGNOSIS — L03119 Cellulitis of unspecified part of limb: Secondary | ICD-10-CM

## 2015-06-12 DIAGNOSIS — R748 Abnormal levels of other serum enzymes: Secondary | ICD-10-CM | POA: Diagnosis not present

## 2015-06-12 DIAGNOSIS — R600 Localized edema: Secondary | ICD-10-CM

## 2015-06-12 MED ORDER — HYDROCHLOROTHIAZIDE 25 MG PO TABS: ORAL_TABLET | ORAL | Status: DC

## 2015-06-12 MED ORDER — OXYCODONE HCL 7.5 MG PO TABS: 1.0000 | ORAL_TABLET | Freq: Three times a day (TID) | ORAL | Status: DC | PRN

## 2015-06-12 MED ORDER — TRAZODONE HCL 100 MG PO TABS: 100.0000 mg | ORAL_TABLET | Freq: Every day | ORAL | Status: DC

## 2015-06-12 NOTE — Patient Instructions (Signed)
Split your amlodipine in half.   Go for labs in about 2 months.

## 2015-06-12 NOTE — Progress Notes (Signed)
   Subjective:    Patient ID: Katherine Nixon, female    DOB: July 18, 1946, 69 y.o.   MRN: [id number]  HPI Hypertension- Pt denies chest pain, SOB, dizziness, or heart palpitations.  Taking meds as directed w/o problems.  Denies medication side effects.    Inosmnia - sleep is beetter. Says the trazodone is working well.    Elevated liver enzymes- enymes were elevated in June.  Also high in May while at [facility].    Lower legs have been swelling.  Usually uses her hctz when it swells but doesn't seem to be effective anymore.  Hands occ swell as well. She has lost 5 ls.  At one point in time she was taking her HCTZ daily but it actually caused her blood pressure to drop so she just takes it as needed.  Had surgery on her right knee since I last saw her, about 2 months ago.  Has been doing PT. Recently had cortisone injection into that knee and that greatly improved her pain.    Has rash on her left anterior shoulder. Started with a red spt and then spread. It was near port area. She saw ortho and they felt it could early cellulitis.  She says she was started on Cipro for the rash and she is concerned it is not getting better. Says it is not worse but color is not better. No fevers.     Review of Systems     Objective:   Physical Exam  Constitutional: She is oriented to person, place, and time. She appears well-developed and well-nourished.  HENT:  Head: Normocephalic and atraumatic.  Cardiovascular: Normal rate, regular rhythm and normal heart sounds.   Pulmonary/Chest: Effort normal and breath sounds normal.  Musculoskeletal: She exhibits edema.  Trace ankle edema on the left.    Neurological: She is alert and oriented to person, place, and time.  Skin: Skin is warm and dry.  Some purple/red discoloration over the left anterior shoulder.  Not well demarcated.   Psychiatric: She has a normal mood and affect. Her behavior is normal.          Assessment & Plan:  HTN - well  Controlled.   F/U in 6 months.   Elevated liver function. - Her liver enzymes have dropped by about 50% from May to June. Recommend that she repeat them again in a couple weeks just to make sure they've gone completely back down to normal now that she is off of Tylenol products.  Insomnia - she is doing well on the trazodone. Refill sent to mail order.  LE edema - will increase hctz to daily and decrease amlodipine to 2.5mg  daily.  This may help control the fluid in reducing the amlodipine may actually help reduce the potential for fluid retention which can happen with amlodipine sometimes. She would also be a great candidate for compression stockings.  Cellulitis, left anterior shoulder-encouraged her continued antibiotic for the next couple days through the weekend if not improving at that point and she should call her orthopedist was prescribed a medication for her and ask him to change the medication. Certainly if it starts to get worse over the weekend she can seek urgent medical care.

## 2015-06-16 ENCOUNTER — Encounter: Payer: Self-pay | Admitting: Family Medicine

## 2015-06-24 DIAGNOSIS — Z9889 Other specified postprocedural states: Secondary | ICD-10-CM | POA: Diagnosis not present

## 2015-06-24 DIAGNOSIS — M25512 Pain in left shoulder: Secondary | ICD-10-CM | POA: Diagnosis not present

## 2015-07-03 DIAGNOSIS — M25512 Pain in left shoulder: Secondary | ICD-10-CM | POA: Diagnosis not present

## 2015-07-06 DIAGNOSIS — M720 Palmar fascial fibromatosis [Dupuytren]: Secondary | ICD-10-CM | POA: Diagnosis not present

## 2015-07-08 DIAGNOSIS — M25512 Pain in left shoulder: Secondary | ICD-10-CM | POA: Diagnosis not present

## 2015-07-10 DIAGNOSIS — M25512 Pain in left shoulder: Secondary | ICD-10-CM | POA: Diagnosis not present

## 2015-07-13 DIAGNOSIS — M25512 Pain in left shoulder: Secondary | ICD-10-CM | POA: Diagnosis not present

## 2015-07-14 DIAGNOSIS — L239 Allergic contact dermatitis, unspecified cause: Secondary | ICD-10-CM | POA: Diagnosis not present

## 2015-07-24 ENCOUNTER — Other Ambulatory Visit: Payer: Self-pay | Admitting: Sports Medicine

## 2015-07-24 MED ORDER — OXYCODONE HCL 7.5 MG PO TABS: 1.0000 | ORAL_TABLET | Freq: Three times a day (TID) | ORAL | Status: DC | PRN

## 2015-07-24 NOTE — Telephone Encounter (Signed)
Pt called clinic requesting refill on Oxycodone, states the last Rx was written by Dr. Dianah Field. Will route for review.

## 2015-07-29 DIAGNOSIS — M25512 Pain in left shoulder: Secondary | ICD-10-CM | POA: Diagnosis not present

## 2015-07-29 DIAGNOSIS — M25511 Pain in right shoulder: Secondary | ICD-10-CM | POA: Diagnosis not present

## 2015-08-11 ENCOUNTER — Other Ambulatory Visit: Payer: Self-pay | Admitting: Family Medicine

## 2015-08-11 DIAGNOSIS — Z1231 Encounter for screening mammogram for malignant neoplasm of breast: Secondary | ICD-10-CM

## 2015-08-12 DIAGNOSIS — R748 Abnormal levels of other serum enzymes: Secondary | ICD-10-CM | POA: Diagnosis not present

## 2015-08-13 ENCOUNTER — Other Ambulatory Visit: Payer: Self-pay | Admitting: *Deleted

## 2015-08-13 DIAGNOSIS — R748 Abnormal levels of other serum enzymes: Secondary | ICD-10-CM

## 2015-08-13 LAB — COMPLETE METABOLIC PANEL WITH GFR
ALT: 22 U/L (ref 6–29)
AST: 28 U/L (ref 10–35)
Albumin: 3.5 g/dL — ABNORMAL LOW (ref 3.6–5.1)
Alkaline Phosphatase: 186 U/L — ABNORMAL HIGH (ref 33–130)
BILIRUBIN TOTAL: 0.5 mg/dL (ref 0.2–1.2)
BUN: 11 mg/dL (ref 7–25)
CHLORIDE: 101 mmol/L (ref 98–110)
CO2: 30 mmol/L (ref 20–31)
CREATININE: 0.61 mg/dL (ref 0.50–0.99)
Calcium: 9.7 mg/dL (ref 8.6–10.4)
GFR, Est Non African American: >89 mL/min (ref >=60)
Glucose, Bld: 133 mg/dL — ABNORMAL HIGH (ref 65–99)
Potassium: 4.1 mmol/L (ref 3.5–5.3)
Sodium: 139 mmol/L (ref 135–146)
Total Protein: 6.9 g/dL (ref 6.1–8.1)

## 2015-08-20 ENCOUNTER — Ambulatory Visit (INDEPENDENT_AMBULATORY_CARE_PROVIDER_SITE_OTHER): Payer: Medicare Other

## 2015-08-20 DIAGNOSIS — Z1231 Encounter for screening mammogram for malignant neoplasm of breast: Secondary | ICD-10-CM

## 2015-08-28 ENCOUNTER — Other Ambulatory Visit: Payer: Self-pay

## 2015-08-28 MED ORDER — OXYCODONE HCL 7.5 MG PO TABS: 1.0000 | ORAL_TABLET | Freq: Three times a day (TID) | ORAL | Status: DC | PRN

## 2015-09-01 DIAGNOSIS — L300 Nummular dermatitis: Secondary | ICD-10-CM | POA: Diagnosis not present

## 2015-09-06 ENCOUNTER — Other Ambulatory Visit: Payer: Self-pay | Admitting: Family Medicine

## 2015-09-09 DIAGNOSIS — M25561 Pain in right knee: Secondary | ICD-10-CM | POA: Diagnosis not present

## 2015-09-09 DIAGNOSIS — M25511 Pain in right shoulder: Secondary | ICD-10-CM | POA: Diagnosis not present

## 2015-09-09 DIAGNOSIS — M25512 Pain in left shoulder: Secondary | ICD-10-CM | POA: Diagnosis not present

## 2015-09-09 DIAGNOSIS — M25562 Pain in left knee: Secondary | ICD-10-CM | POA: Diagnosis not present

## 2015-09-22 ENCOUNTER — Ambulatory Visit (INDEPENDENT_AMBULATORY_CARE_PROVIDER_SITE_OTHER): Payer: Medicare Other | Admitting: Sports Medicine

## 2015-09-22 ENCOUNTER — Encounter: Payer: Self-pay | Admitting: Sports Medicine

## 2015-09-22 VITALS — BP 141/61 | HR 84 | Wt 177.0 lb

## 2015-09-22 DIAGNOSIS — G894 Chronic pain syndrome: Secondary | ICD-10-CM | POA: Diagnosis not present

## 2015-09-22 DIAGNOSIS — M180 Bilateral primary osteoarthritis of first carpometacarpal joints: Secondary | ICD-10-CM

## 2015-09-22 MED ORDER — OXYCODONE HCL 7.5 MG PO TABS: 1.0000 | ORAL_TABLET | Freq: Two times a day (BID) | ORAL | Status: DC

## 2015-09-22 NOTE — Assessment & Plan Note (Signed)
Refilling oxycodone, 7.5 mg twice a day, we did discuss pain clinic referral.

## 2015-09-22 NOTE — Progress Notes (Signed)
  Subjective:    CC: follow-up  HPI: Chronic pain: Currently controlled with oxycodone, needs a refill.  Bilateral thumb osteoarthritis: Pain is at the thumb basal joint, moderate, persistent without radiation, desires interventional treatment today.  Skin rash: Left shoulder, localized around the prior left anterior arthroscopy portal, she has seen both Dr. Tamera Punt in a dermatologist, has had topical steroids and antibiotics, it really doesn't bother her but the rash is still present. No constitutional symptoms.  Past medical history, Surgical history, Family history not pertinant except as noted below, Social history, Allergies, and medications have been entered into the medical record, reviewed, and no changes needed.   Review of Systems: No fevers, chills, night sweats, weight loss, chest pain, or shortness of breath.   Objective:    General: Well Developed, well nourished, and in no acute distress.  Neuro: Alert and oriented x3, extra-ocular muscles intact, sensation grossly intact.  HEENT: Normocephalic, atraumatic, pupils equal round reactive to light, neck supple, no masses, no lymphadenopathy, thyroid nonpalpable.  Skin: Warm and dry, no rashes. Cardiac: Regular rate and rhythm, no murmurs rubs or gallops, no lower extremity edema.  Respiratory: Clear to auscultation bilaterally. Not using accessory muscles, speaking in full sentences. Right Wrist: Inspection normal with no visible erythema or swelling. ROM smooth and normal with good flexion and extension and ulnar/radial deviation that is symmetrical with opposite wrist. Palpation is normal over metacarpals, navicular, lunate, and TFCC; tendons without tenderness/ swelling, tender to palpation at the carpometacarpal joint No snuffbox tenderness. No tenderness over Canal of Guyon. Strength 5/5 in all directions without pain. Negative Finkelstein, tinel's and phalens. Negative Watson's test.  Procedure: Real-time Ultrasound  Guided Injection of right carpometacarpal joint Device: GE Logiq E  Verbal informed consent obtained.  Time-out conducted.  Noted no overlying erythema, induration, or other signs of local infection.  Skin prepped in a sterile fashion.  Local anesthesia: Topical Ethyl chloride.  With sterile technique and under real time ultrasound guidance:  0.5 mL Kenalog 40 was 0.5 mL lidocaine injected easily Completed without difficulty  Pain immediately resolved suggesting accurate placement of the medication.  Advised to call if fevers/chills, erythema, induration, drainage, or persistent bleeding.  Images permanently stored and available for review in the ultrasound unit.  Impression: Technically successful ultrasound guided injection.  Impression and Recommendations:

## 2015-09-22 NOTE — Assessment & Plan Note (Signed)
Right CMC injection as above.

## 2015-09-23 DIAGNOSIS — R768 Other specified abnormal immunological findings in serum: Secondary | ICD-10-CM | POA: Diagnosis not present

## 2015-09-23 DIAGNOSIS — Z23 Encounter for immunization: Secondary | ICD-10-CM | POA: Diagnosis not present

## 2015-10-01 DIAGNOSIS — R748 Abnormal levels of other serum enzymes: Secondary | ICD-10-CM | POA: Diagnosis not present

## 2015-10-02 LAB — ALKALINE PHOSPHATASE: ALK PHOS: 260 U/L — AB (ref 33–130)

## 2015-10-12 ENCOUNTER — Other Ambulatory Visit: Payer: Self-pay | Admitting: Family Medicine

## 2015-10-12 DIAGNOSIS — R748 Abnormal levels of other serum enzymes: Secondary | ICD-10-CM

## 2015-10-12 NOTE — Progress Notes (Signed)
The  Alkaline phosphatase is a level that can either come from the liver or a can come from bone. That's where the additional tests will help differentiate if it's coming from the liver or from bone tissue. Is also made in small quantities in the gut.

## 2015-10-20 ENCOUNTER — Encounter: Payer: Self-pay | Admitting: Sports Medicine

## 2015-10-20 ENCOUNTER — Other Ambulatory Visit: Payer: Self-pay | Admitting: Family Medicine

## 2015-10-20 ENCOUNTER — Ambulatory Visit (INDEPENDENT_AMBULATORY_CARE_PROVIDER_SITE_OTHER): Payer: Medicare Other | Admitting: Sports Medicine

## 2015-10-20 VITALS — BP 135/72 | HR 86 | Wt 178.0 lb

## 2015-10-20 DIAGNOSIS — R748 Abnormal levels of other serum enzymes: Secondary | ICD-10-CM | POA: Diagnosis not present

## 2015-10-20 DIAGNOSIS — G894 Chronic pain syndrome: Secondary | ICD-10-CM

## 2015-10-20 DIAGNOSIS — M160 Bilateral primary osteoarthritis of hip: Secondary | ICD-10-CM

## 2015-10-20 DIAGNOSIS — M180 Bilateral primary osteoarthritis of first carpometacarpal joints: Secondary | ICD-10-CM

## 2015-10-20 MED ORDER — OXYCODONE HCL 7.5 MG PO TABS: 1.0000 | ORAL_TABLET | Freq: Two times a day (BID) | ORAL | Status: DC

## 2015-10-20 NOTE — Progress Notes (Signed)
  Subjective:    CC: follow-up  HPI: Trapeziometacarpal arthritis: Persistent pain despite injection last month, she has already seen Dr. Grandville Silos, she is a candidate for thumb suspension surgery.  Right hip pain: No moderate osteoarthritis, previous injection provided 1.5 years of relief, desires repeat interventional treatment today.  Past medical history, Surgical history, Family history not pertinant except as noted below, Social history, Allergies, and medications have been entered into the medical record, reviewed, and no changes needed.   Review of Systems: No fevers, chills, night sweats, weight loss, chest pain, or shortness of breath.   Objective:    General: Well Developed, well nourished, and in no acute distress.  Neuro: Alert and oriented x3, extra-ocular muscles intact, sensation grossly intact.  HEENT: Normocephalic, atraumatic, pupils equal round reactive to light, neck supple, no masses, no lymphadenopathy, thyroid nonpalpable.  Skin: Warm and dry, no rashes. Cardiac: Regular rate and rhythm, no murmurs rubs or gallops, no lower extremity edema.  Respiratory: Clear to auscultation bilaterally. Not using accessory muscles, speaking in full sentences.  Procedure: Real-time Ultrasound Guided Injection of  Right femoroacetabular joint Device: GE Logiq E  Verbal informed consent obtained.  Time-out conducted.  Noted no overlying erythema, induration, or other signs of local infection.  Skin prepped in a sterile fashion.  Local anesthesia: Topical Ethyl chloride.  With sterile technique and under real time ultrasound guidance:  22-gauge spinal needle advanced into the femoral head/neck junction, there was a visible joint effusion,attempt at aspiration, no fluid obtained, syringe switched and 2 mL lidocaine, 2 mL Marcaine, 2 mL kenalog 40 injected easily. Completed without difficulty  Pain immediately resolved suggesting accurate placement of the medication.  Advised to  call if fevers/chills, erythema, induration, drainage, or persistent bleeding.  Images permanently stored and available for review in the ultrasound unit.  Impression: Technically successful ultrasound guided injection.  Impression and Recommendations:

## 2015-10-20 NOTE — Assessment & Plan Note (Signed)
1.5 year response to previous injection, repeat femoral acetabular injection today.

## 2015-10-20 NOTE — Assessment & Plan Note (Signed)
Persistent pain despite right first trapezial metacarpal joint injection at the last visit, at this point she is a candidate for thumb suspension surgery.

## 2015-10-21 LAB — CBC WITH DIFFERENTIAL/PLATELET
BASOS ABS: 0.1 10*3/uL (ref 0.0–0.1)
Basophils Relative: 1 % (ref 0–1)
EOS ABS: 0.2 10*3/uL (ref 0.0–0.7)
EOS PCT: 3 % (ref 0–5)
HCT: 42.1 % (ref 36.0–46.0)
Hemoglobin: 13.6 g/dL (ref 12.0–15.0)
LYMPHS ABS: 1.7 10*3/uL (ref 0.7–4.0)
Lymphocytes Relative: 29 % (ref 12–46)
MCH: 28.7 pg (ref 26.0–34.0)
MCHC: 32.3 g/dL (ref 30.0–36.0)
MCV: 88.8 fL (ref 78.0–100.0)
MONOS PCT: 8 % (ref 3–12)
MPV: 8.5 fL — AB (ref 8.6–12.4)
Monocytes Absolute: 0.5 10*3/uL (ref 0.1–1.0)
Neutro Abs: 3.4 10*3/uL (ref 1.7–7.7)
Neutrophils Relative %: 59 % (ref 43–77)
PLATELETS: 245 10*3/uL (ref 150–400)
RBC: 4.74 MIL/uL (ref 3.87–5.11)
RDW: 17.5 % — AB (ref 11.5–15.5)
WBC: 5.7 10*3/uL (ref 4.0–10.5)

## 2015-10-21 LAB — PTH, INTACT AND CALCIUM
CALCIUM: 10.4 mg/dL (ref 8.4–10.5)
PTH: 63 pg/mL (ref 14–64)

## 2015-10-21 LAB — TSH: TSH: 0.969 u[IU]/mL (ref 0.350–4.500)

## 2015-10-22 LAB — PROTEIN ELECTROPHORESIS, URINE REFLEX
TOTAL PROTEIN, URINE: 5 mg/dL
Total Protein, Urine/Day: 115 mg/d — ABNORMAL HIGH (ref 50–100)

## 2015-10-22 LAB — PROTEIN ELECTROPHORESIS, SERUM
ALPHA-2-GLOBULIN: 0.9 g/dL (ref 0.5–0.9)
Albumin ELP: 3.8 g/dL (ref 3.8–4.8)
Alpha-1-Globulin: 0.4 g/dL — ABNORMAL HIGH (ref 0.2–0.3)
Beta 2: 0.4 g/dL (ref 0.2–0.5)
Beta Globulin: 0.5 g/dL (ref 0.4–0.6)
GAMMA GLOBULIN: 2.1 g/dL — AB (ref 0.8–1.7)
TOTAL PROTEIN, SERUM ELECTROPHOR: 8.1 g/dL (ref 6.1–8.1)

## 2015-10-28 LAB — IMMUNOFIXATION ELECTROPHORESIS
IGA: 318 mg/dL (ref 69–380)
IGG (IMMUNOGLOBIN G), SERUM: 2130 mg/dL — AB (ref 690–1700)
IgM, Serum: 354 mg/dL — ABNORMAL HIGH (ref 52–322)
Total Protein, Serum Electrophoresis: 8 g/dL (ref 6.0–8.3)

## 2015-11-03 ENCOUNTER — Telehealth: Payer: Self-pay

## 2015-11-03 DIAGNOSIS — G894 Chronic pain syndrome: Secondary | ICD-10-CM

## 2015-11-03 MED ORDER — OXYCODONE HCL 10 MG PO TABS: 10.0000 mg | ORAL_TABLET | Freq: Two times a day (BID) | ORAL | Status: DC

## 2015-11-03 NOTE — Telephone Encounter (Signed)
[facility] called and states the Oxycodone does not come in 7.5 mg strength. Please advise.   [facility] [phone]

## 2015-11-03 NOTE — Telephone Encounter (Signed)
I will switch to 10mg . Rx in box.

## 2015-11-04 NOTE — Telephone Encounter (Signed)
Left a message advising patient to pick up new prescription.

## 2015-11-06 ENCOUNTER — Other Ambulatory Visit: Payer: Self-pay | Admitting: Family Medicine

## 2015-11-06 DIAGNOSIS — R748 Abnormal levels of other serum enzymes: Secondary | ICD-10-CM

## 2015-11-07 LAB — ALKALINE PHOSPHATASE: ALK PHOS: 143 U/L — AB (ref 33–130)

## 2015-11-09 ENCOUNTER — Telehealth: Payer: Self-pay

## 2015-11-09 DIAGNOSIS — R899 Unspecified abnormal finding in specimens from other organs, systems and tissues: Secondary | ICD-10-CM

## 2015-11-09 NOTE — Telephone Encounter (Signed)
-----  Message from Hali Marry, MD sent at 11/08/2015 10:07 PM EST ----- Call pt: alk phos has come way down from last time. Now 143, was 260 so almost half.  Almost back into the normal range. Lets recheck in 6 weeks to see if back in the normal range.  Also see if she has a GI doc.  We may try to get her in with them as well just to see if any new recommendations.

## 2015-11-09 NOTE — Telephone Encounter (Signed)
Patient aware of lab results.  She does not have a GI doctor.  Please advise.

## 2015-11-10 NOTE — Telephone Encounter (Signed)
Referral has been placed for Gastro for abnormal alk phos labs.

## 2015-11-11 ENCOUNTER — Encounter: Payer: Self-pay | Admitting: Gastroenterology

## 2015-11-24 ENCOUNTER — Encounter: Payer: Self-pay | Admitting: Sports Medicine

## 2015-11-24 ENCOUNTER — Ambulatory Visit (INDEPENDENT_AMBULATORY_CARE_PROVIDER_SITE_OTHER): Payer: Medicare Other | Admitting: Sports Medicine

## 2015-11-24 ENCOUNTER — Ambulatory Visit (INDEPENDENT_AMBULATORY_CARE_PROVIDER_SITE_OTHER): Payer: Medicare Other

## 2015-11-24 VITALS — BP 133/75 | HR 85 | Temp 98.0°F | Resp 18 | Wt 180.8 lb

## 2015-11-24 DIAGNOSIS — M25512 Pain in left shoulder: Secondary | ICD-10-CM | POA: Diagnosis not present

## 2015-11-24 DIAGNOSIS — M160 Bilateral primary osteoarthritis of hip: Secondary | ICD-10-CM

## 2015-11-24 DIAGNOSIS — R748 Abnormal levels of other serum enzymes: Secondary | ICD-10-CM | POA: Diagnosis not present

## 2015-11-24 NOTE — Assessment & Plan Note (Signed)
Post cholecystectomy in the distant past. Levels have been up and down, I am going to go ahead and obtain a liver ultrasound, further follow-up with PCP.

## 2015-11-24 NOTE — Progress Notes (Signed)
  Subjective:    CC: Follow-up  HPI:  Right hip osteoarthritis: Pain-free after injection at the last visit.  Left shoulder pain:  Recent fall, pain over the deltoid.  Elevated alkaline phosphatase: Patient tells me it has been up and down, more recently down out of the 200s, has not yet had liver imaging. Post cholecystectomy, not presently has difficulty with greasy foods, otherwise minimal abdominal pain. No jaundiced eyes.  Past medical history, Surgical history, Family history not pertinant except as noted below, Social history, Allergies, and medications have been entered into the medical record, reviewed, and no changes needed.   Review of Systems: No fevers, chills, night sweats, weight loss, chest pain, or shortness of breath.   Objective:    General: Well Developed, well nourished, and in no acute distress.  Neuro: Alert and oriented x3, extra-ocular muscles intact, sensation grossly intact.  HEENT: Normocephalic, atraumatic, pupils equal round reactive to light, neck supple, no masses, no lymphadenopathy, thyroid nonpalpable.  Skin: Warm and dry, no rashes. Cardiac: Regular rate and rhythm, no murmurs rubs or gallops, no lower extremity edema.  Respiratory: Clear to auscultation bilaterally. Not using accessory muscles, speaking in full sentences. Abdomen: Soft, nontender, non-distended, normal bowel sounds, no palpable masses, no guarding, rigidity, rebound tenderness.  Impression and Recommendations:

## 2015-11-24 NOTE — Assessment & Plan Note (Signed)
Recent fall, obtaining x-rays

## 2015-11-24 NOTE — Assessment & Plan Note (Signed)
Pain-free now after hip joint injection at the last visit, rarely needs her oxycodone now.

## 2015-11-25 ENCOUNTER — Other Ambulatory Visit: Payer: Medicare Other

## 2015-11-27 ENCOUNTER — Ambulatory Visit (INDEPENDENT_AMBULATORY_CARE_PROVIDER_SITE_OTHER): Payer: Medicare Other

## 2015-11-27 DIAGNOSIS — Z9049 Acquired absence of other specified parts of digestive tract: Secondary | ICD-10-CM | POA: Diagnosis not present

## 2015-11-27 DIAGNOSIS — R748 Abnormal levels of other serum enzymes: Secondary | ICD-10-CM

## 2015-11-27 DIAGNOSIS — R7989 Other specified abnormal findings of blood chemistry: Secondary | ICD-10-CM | POA: Diagnosis not present

## 2015-12-11 ENCOUNTER — Telehealth: Payer: Self-pay | Admitting: Family Medicine

## 2015-12-11 ENCOUNTER — Ambulatory Visit (INDEPENDENT_AMBULATORY_CARE_PROVIDER_SITE_OTHER): Payer: Medicare Other | Admitting: Family Medicine

## 2015-12-11 ENCOUNTER — Encounter: Payer: Self-pay | Admitting: Family Medicine

## 2015-12-11 VITALS — BP 131/54 | HR 73 | Wt 179.0 lb

## 2015-12-11 DIAGNOSIS — S161XXA Strain of muscle, fascia and tendon at neck level, initial encounter: Secondary | ICD-10-CM

## 2015-12-11 DIAGNOSIS — H8111 Benign paroxysmal vertigo, right ear: Secondary | ICD-10-CM

## 2015-12-11 DIAGNOSIS — R109 Unspecified abdominal pain: Secondary | ICD-10-CM

## 2015-12-11 DIAGNOSIS — R197 Diarrhea, unspecified: Secondary | ICD-10-CM

## 2015-12-11 DIAGNOSIS — I10 Essential (primary) hypertension: Secondary | ICD-10-CM | POA: Diagnosis not present

## 2015-12-11 DIAGNOSIS — R0982 Postnasal drip: Secondary | ICD-10-CM

## 2015-12-11 DIAGNOSIS — R140 Abdominal distension (gaseous): Secondary | ICD-10-CM | POA: Diagnosis not present

## 2015-12-11 DIAGNOSIS — E785 Hyperlipidemia, unspecified: Secondary | ICD-10-CM

## 2015-12-11 MED ORDER — FLUTICASONE PROPIONATE 50 MCG/ACT NA SUSP: 2.0000 | Freq: Every day | NASAL | Status: DC

## 2015-12-11 MED ORDER — IPRATROPIUM BROMIDE 0.03 % NA SOLN: 2.0000 | Freq: Two times a day (BID) | NASAL | Status: DC

## 2015-12-11 MED ORDER — AMLODIPINE BESYLATE 5 MG PO TABS: ORAL_TABLET | ORAL | Status: DC

## 2015-12-11 NOTE — Telephone Encounter (Signed)
Call pt: for her post nasal drip, recommend a trial of nasal steroid spray. I will send over generic for her to try for 2 week and see if feel better.

## 2015-12-11 NOTE — Telephone Encounter (Signed)
New Rx sent.

## 2015-12-11 NOTE — Progress Notes (Signed)
   Subjective:    Patient ID: Katherine Nixon, female    DOB: February 17, 1946, 70 y.o.   MRN: [id number]  HPI Hypertension- Pt denies chest pain, SOB, dizziness, or heart palpitations.  Taking meds as directed w/o problems.  Denies medication side effects.    Has had more vertigo lately. Says she wonders if related to her sinuses as she has had a lot of persistant drainage that is thick and has been choking her at times. No fever or chills. Has tried Sudafed but not helping.  She notices it more when she lays to the left in bed. 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 She did home PT and felt it wasn't helpful.    She has some pain in the right upper side of neck radiating into scalp x 2 months.  Has been more tired recently. Says painful to turn her neck and feels very stiff. Her ROM is limited.   Diarrhea and bloating and right sided abdominal pian x 1 week.  Had negative stool work up about 6 months ago.  No blood in the stool.    Review of Systems     Objective:   Physical Exam  Constitutional: She is oriented to person, place, and time. She appears well-developed and well-nourished.  HENT:  Head: Normocephalic and atraumatic.  Cardiovascular: Normal rate, regular rhythm and normal heart sounds.   Pulmonary/Chest: Effort normal and breath sounds normal.  Musculoskeletal:  Cervical flexion and extension are norma. Dec rotation right and left. Pain over the left facet at the base of the skull.   Neurological: She is alert and oriented to person, place, and time. No cranial nerve deficit. Coordination normal.  Diks- Hallpike manuver positive and more intense to the right.   Skin: Skin is warm and dry.  Psychiatric: She has a normal mood and affect. Her behavior is normal.          Assessment & Plan:  HTN - well conbtrolled.  Due for labs next month. F/U in 6 months.   Left cervical pain - Rec PT.  Given H.O. On exercises to do at home on her  own.  Work on heat adn stretches.  Can use aleve BID.    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 Explained how this can help her sxs.    Post nasal drip. Consider trial of nasal steroid.    Diarrhea/Bloating x 1 yr - refer to GI for w/u. Plus she is due for her f/U 10 yr colonosopy.

## 2015-12-11 NOTE — Telephone Encounter (Signed)
Pt would like to try nasal spray. Will route to PCP for an order. Pharmacy is updated in chart.

## 2015-12-16 DIAGNOSIS — R197 Diarrhea, unspecified: Secondary | ICD-10-CM | POA: Diagnosis not present

## 2015-12-16 DIAGNOSIS — R1032 Left lower quadrant pain: Secondary | ICD-10-CM | POA: Diagnosis not present

## 2015-12-21 ENCOUNTER — Ambulatory Visit (INDEPENDENT_AMBULATORY_CARE_PROVIDER_SITE_OTHER): Payer: Medicare Other | Admitting: Rehabilitative and Restorative Service Providers"

## 2015-12-21 ENCOUNTER — Encounter: Payer: Self-pay | Admitting: Rehabilitative and Restorative Service Providers"

## 2015-12-21 DIAGNOSIS — M623 Immobility syndrome (paraplegic): Secondary | ICD-10-CM

## 2015-12-21 DIAGNOSIS — M2560 Stiffness of unspecified joint, not elsewhere classified: Secondary | ICD-10-CM

## 2015-12-21 DIAGNOSIS — R531 Weakness: Secondary | ICD-10-CM

## 2015-12-21 DIAGNOSIS — R293 Abnormal posture: Secondary | ICD-10-CM | POA: Diagnosis not present

## 2015-12-21 DIAGNOSIS — M542 Cervicalgia: Secondary | ICD-10-CM | POA: Diagnosis not present

## 2015-12-21 DIAGNOSIS — H8110 Benign paroxysmal vertigo, unspecified ear: Secondary | ICD-10-CM | POA: Diagnosis not present

## 2015-12-21 DIAGNOSIS — Z7409 Other reduced mobility: Secondary | ICD-10-CM

## 2015-12-21 NOTE — Patient Instructions (Signed)
Axial Extension (Chin Tuck)    Pull chin in and lengthen back of neck. Hold __10__ seconds while counting out loud. Repeat _10___ times. Do _several___ sessions per day.  Shoulder Blade Squeeze    Rotate shoulders back, then squeeze shoulder blades down and back. Hold 10 sec Repeat __10__ times. Do ___several_ sessions per day. Can use swim noodle along spine for tactile cue.    TENS UNIT: This is helpful for muscle pain and spasm.   Search and Purchase a TENS 7000 2nd edition at www.tenspros.com. It should be less than $30.     TENS unit instructions: Do not shower or bathe with the unit on Turn the unit off before removing electrodes or batteries If the electrodes lose stickiness add a drop of water to the electrodes after they are disconnected from the unit and place on plastic sheet. If you continued to have difficulty, call the TENS unit company to purchase more electrodes. Do not apply lotion on the skin area prior to use. Make sure the skin is clean and dry as this will help prolong the life of the electrodes. After use, always check skin for unusual red areas, rash or other skin difficulties. If there are any skin problems, does not apply electrodes to the same area. Never remove the electrodes from the unit by pulling the wires. Do not use the TENS unit or electrodes other than as directed. Do not change electrode placement without consultating your therapist or physician. Keep 2 fingers with between each electrode.

## 2015-12-21 NOTE — Therapy (Signed)
[place]  [place], [place], [address] Phone: [phone]   Fax:  [phone]  Physical Therapy Evaluation  Patient Details  Name: Katherine Nixon MRN: [id number] Date of Birth: 07-10-1946 Referring Provider: Madilyn Fireman   Encounter Date: 12/21/2015      PT End of Session - 12/21/15 1305    Visit Number 1   Number of Visits 12   Date for PT Re-Evaluation 02/01/16   PT Start Time [id number]   PT Stop Time 1119   PT Time Calculation (min) 50 min   Activity Tolerance Patient limited by pain;Patient tolerated treatment well      Past Medical History  Diagnosis Date  . Squamous cell carcinoma [facility])     Past Surgical History  Procedure Laterality Date  . Nasal sinus surgery    . Laparoscopic cholecystectomy    . Breast surgery      biopsies/ aspirations - neg  . Dilation and curettage of uterus    . Varicose vein surgery    . Cholecystectomy    . Shoulder surgery Left 12/2014    Dr. Tamera Punt     There were no vitals filed for this visit.  Visit Diagnosis:  Cervical pain - Plan: PT plan of care cert/re-cert  BPPV (benign paroxysmal positional vertigo), unspecified laterality - Plan: PT plan of care cert/re-cert  Stiffness due to immobility - Plan: PT plan of care cert/re-cert  Abnormal posture - Plan: PT plan of care cert/re-cert  Decreased strength, endurance, and mobility - Plan: PT plan of care cert/re-cert      Subjective Assessment - 12/21/15 1029    Subjective Katherine Nixon reports that she has a neck strain with pain in the back of her neck and up into her head causing headaches. She also has vertigo which the doctor says is positional. Katherine Nixon into door striking Lt shoulder about 4 weeks ago. Dizziness occurs intermittently - sometimes when she turns from back to Lt side or Rt side but Lt seems to be worse; She sometimes has dizziness with standing and changing positions or bend forward postions. Vertigo  has been there ~ 5 months - present before she fell.    Pertinent History Lupus diagnosed ~3 yrs ago; bilat knees surgeries last year for I & D; Lt shoulder surgery 2016 with continued pain; DJD bilat thumbs will require surgery; bilat DJD elbows   How long can you sit comfortably? 30 min    How long can you stand comfortably? when she has neck pain - 10-15 min    How long can you walk comfortably? 1 hour    Diagnostic tests xray - shoulder    Patient Stated Goals get rid of neck pain and headaches - help vertigo    Currently in Pain? Yes   Pain Score 4   goes up to 8/10    Pain Location Neck   Pain Orientation Left   Pain Descriptors / Indicators Sharp;Aching   Pain Radiating Towards up toward head    Aggravating Factors  prolonged sitting; standing; walking    Pain Relieving Factors meds; heat             [facility] PT Assessment - 12/21/15 0001    Assessment   Medical Diagnosis BPPV; cervical pain    Referring Provider Metheney    Onset Date/Surgical Date 10/06/15   Hand Dominance Right   Next MD Visit 3/17   Prior Therapy not for neck - has had PT for shoulder and  knees    Precautions   Precautions None   Balance Screen   Has the patient fallen in the past 6 months Yes   How many times? 1   Has the patient had a decrease in activity level because of a fear of falling?  No   Is the patient reluctant to leave their home because of a fear of falling?  No   Home Environment   Additional Comments multilevel home - difficulty with stairs - stays on the first level    Prior Function   Level of Independence Independent with basic ADLs   Vocation Retired   Leisure light household chores; shopping; cooking   Sensation   Additional Comments WFL's per pt report    Posture/Postural Control   Posture Comments head forward; shoulders rounded and elevated in sitting. Head forward, shoulders rounded and elevated; head of the humerus anterior in orientation; increased thoracic kyphosis;  scapulae abducted and rotated along the thoracic wall; flexed forward at hops and trunk    AROM   Overall AROM Comments limited UE ROM in all planes - painful with end ranges Lt > Rt    AROM Assessment Site --  pain w/ all cervical motions - pain Lt lateral cervcial area   Cervical Flexion 38   Cervical Extension 38   Cervical - Right Side Bend 27   Cervical - Left Side Bend 19   Cervical - Right Rotation 54   Cervical - Left Rotation 44   Strength   Overall Strength Comments grossly 3+/5 to 4/5 bilat UE's - no neurological pattern    Palpation   Spinal mobility pain with PA glides through cervical spine    Palpation comment ant/lat/post cervical musculature; upper trap; leveator; pecs; teres LT >> Rt    Ambulation/Gait   Gait Comments ambulates with head forward posture; flexed at hips and trunk; head forward; shoulders rounded                    [facility] Adult PT Treatment/Exercise - 12/21/15 0001    Therapeutic Activites    Therapeutic Activities --  myofacial ball release    Neuro Re-ed    Neuro Re-ed Details  working on posture and alignment    Neck Exercises: Standing   Neck Retraction 10 reps;10 secs   Neck Retraction Limitations to patient tolerance    Shoulder Exercises: Standing   Other Standing Exercises scap squeeze with noodle 10 sec x 10    Moist Heat Therapy   Number Minutes Moist Heat 15 Minutes   Moist Heat Location Cervical  Lt   Electrical Stimulation   Electrical Stimulation Location Lt cervical    Electrical Stimulation Action IFC   Electrical Stimulation Parameters to tolerance    Electrical Stimulation Goals Pain;Tone                PT Education - 12/21/15 1113    Education provided Yes   Education Details posture and alignment; myofacial ball release work; Production manager) Educated Patient   Methods Explanation;Demonstration;Tactile cues;Verbal cues;Handout   Comprehension Verbalized understanding;Returned demonstration;Verbal  cues required;Tactile cues required             PT Long Term Goals - 12/21/15 1312    PT LONG TERM GOAL #1   Title Improve posture and alignment with patient to demo more upright posture of head on neck and shoulders 02/01/16   Time 6   Period Weeks   Status New  PT LONG TERM GOAL #2   Title Improve cervical ROM by 5-10 degrees 02/01/16   Time 6   Period Weeks   Status New   PT LONG TERM GOAL #3   Title patient to tolerate increased activity level for household chores - cooking and cleaning with increased endurance per pt report 02/01/16   Time 6   Period Weeks   Status New   PT LONG TERM GOAL #4   Title I in HEP 02/01/16   Time 6   Period Weeks   Status New   PT LONG TERM GOAL #5   Title Further assess and address BPPV as tolerated 3/131/7   Time 6   Period Weeks   Status New               Plan - 12/21/15 1306    Clinical Impression Statement Katherine Nixon presents with numerous mysculoskeletal problems as well as dizziness on an intermittent basis. She has poor posture and alignment; limited cervical and UE ROM and strength; muscular tenderness and tightness to palpation; limitd functional activity level and pain on a daily basis. Katherine Nixon does not have the cervical mobility to test nor treat BPPV with repositioning manuver. She may be a canditate for [employer] exercises as cervical symptoms subside. Katherine Nixon will benefit form PT to address musculoskeletal problems identified.    Pt will benefit from skilled therapeutic intervention in order to improve on the following deficits Postural dysfunction;Improper body mechanics;Pain;Dizziness;Increased fascial restricitons;Decreased range of motion;Decreased mobility;Decreased endurance;Decreased activity tolerance   Rehab Potential Good   PT Frequency 2x / week   PT Duration 6 weeks   PT Treatment/Interventions Patient/family education;ADLs/Self Care Home Management;Therapeutic exercise;Therapeutic activities;Neuromuscular  re-education;Manual techniques;Dry needling;Cryotherapy;Electrical Stimulation;Iontophoresis 4mg /ml Dexamethasone;Moist Heat;Ultrasound   PT Next Visit Plan continue assessment of BPPV as indicated/tolerated; postural correction; stretching; posterior shoulder girdle strengthening; manual work; TDN as indicated; modalities    PT Home Exercise Plan postural correction; HEP    Recommended Other Services water exercise program 3 days/wk    Consulted and Agree with Plan of Care Patient         Problem List Patient Active Problem List   Diagnosis Date Noted  . Elevated alkaline phosphatase level 11/24/2015  . Primary osteoarthritis of both first carpometacarpal joints 09/22/2015  . Right shoulder pain 02/23/2015  . Chronic pain syndrome 02/06/2015  . Bilateral carpal tunnel syndrome 01/19/2015  . Dyssomnia 06/15/2014  . Left shoulder pain 06/15/2014  . Dry mouth 06/15/2014  . DDD (degenerative disc disease), lumbar 06/15/2014  . Insomnia 04/30/2014  . Actinic porokeratosis 02/27/2014  . ANA positive 01/24/2014  . History of arthroscopy of left shoulder 01/17/2014  . Strain of gastrocnemius tendon of right lower extremity 12/16/2013  . Osteoarthritis of both knees 11/11/2013  . Lumbar degenerative disc disease 11/11/2013  . Osteoarthritis, hip, bilateral 10/14/2013  . Lupus (systemic lupus erythematosus) ([place]) 05/29/2013  . Metatarsalgia of left foot 12/24/2012  . Chronic venous insufficiency 12/14/2012  . Hyperlipemia 06/02/2011  . Atrophic vaginitis 06/01/2011  . Essential hypertension, benign 05/09/2011  . GERD (gastroesophageal reflux disease) 05/09/2011  . Varicose veins 05/09/2011  . Urinary incontinence 03/25/2011    Matei Magnone Nilda Simmer  PT, MPH   12/21/2015, 1:20 PM  [facility] [place], [place], [address] Phone: [phone]   Fax:  [phone]  Name: Katherine Nixon MRN: [id number] Date of Birth:  1946-06-12

## 2015-12-25 ENCOUNTER — Ambulatory Visit (INDEPENDENT_AMBULATORY_CARE_PROVIDER_SITE_OTHER): Payer: Medicare Other | Admitting: Physical Therapy

## 2015-12-25 DIAGNOSIS — M623 Immobility syndrome (paraplegic): Secondary | ICD-10-CM | POA: Diagnosis not present

## 2015-12-25 DIAGNOSIS — R531 Weakness: Secondary | ICD-10-CM

## 2015-12-25 DIAGNOSIS — Z7409 Other reduced mobility: Secondary | ICD-10-CM

## 2015-12-25 DIAGNOSIS — H8110 Benign paroxysmal vertigo, unspecified ear: Secondary | ICD-10-CM | POA: Diagnosis not present

## 2015-12-25 DIAGNOSIS — M542 Cervicalgia: Secondary | ICD-10-CM

## 2015-12-25 DIAGNOSIS — R293 Abnormal posture: Secondary | ICD-10-CM

## 2015-12-25 DIAGNOSIS — M2560 Stiffness of unspecified joint, not elsewhere classified: Secondary | ICD-10-CM

## 2015-12-25 NOTE — Therapy (Signed)
[place], [place], [address] Phone: [phone]   Fax:  [phone]  Physical Therapy Treatment  Patient Details  Name: Katherine Nixon MRN: [id number] Date of Birth: May 21, 1946 Referring Provider: Dr. Madilyn Fireman  Encounter Date: 12/25/2015      PT End of Session - 12/25/15 1407    Visit Number 2   Number of Visits 12   Date for PT Re-Evaluation 02/01/16   PT Start Time 1332   PT Stop Time 1419   PT Time Calculation (min) 47 min      Past Medical History  Diagnosis Date  . Squamous cell carcinoma [facility])     Past Surgical History  Procedure Laterality Date  . Nasal sinus surgery    . Laparoscopic cholecystectomy    . Breast surgery      biopsies/ aspirations - neg  . Dilation and curettage of uterus    . Varicose vein surgery    . Cholecystectomy    . Shoulder surgery Left 12/2014    Dr. Tamera Punt     There were no vitals filed for this visit.  Visit Diagnosis:  Cervical pain  BPPV (benign paroxysmal positional vertigo), unspecified laterality  Stiffness due to immobility  Abnormal posture  Decreased strength, endurance, and mobility      Subjective Assessment - 12/25/15 1339    Subjective Pt reports she hasn't had a headache since last visit.  Vertigo was a little worse yesterday, "today is not bad".  Pain in Lt shoulder is 5/10.     Pertinent History Lupus diagnosed ~3 yrs ago; bilat knees surgeries last year for I & D; Lt shoulder surgery 2016 with continued pain; DJD bilat thumbs will require surgery; bilat DJD elbows   Currently in Pain? Yes   Pain Score 4    Pain Location Neck   Pain Orientation Left   Pain Descriptors / Indicators Aching            [facility] PT Assessment - 12/25/15 0001    Assessment   Medical Diagnosis BPPV; cervical pain    Referring Provider Dr. Madilyn Fireman   Onset Date/Surgical Date 10/06/15   Hand Dominance Right   Next MD Visit 3/17           [facility] Adult PT Treatment/Exercise - 12/25/15 0001    Neck Exercises: Standing   Neck Retraction 10 reps;10 secs   Neck Retraction Limitations tactile cues to improve form   Other Standing Exercises scap squeeze with noodle x 10 sec hold x 10 rep   Moist Heat Therapy   Number Minutes Moist Heat 15 Minutes   Moist Heat Location Cervical  upper thoracic   Electrical Stimulation   Electrical Stimulation Location cervical paraspinals and levator attachment   Electrical Stimulation Action IFC   Electrical Stimulation Parameters to tolerance    Electrical Stimulation Goals Pain;Tone   Manual Therapy   Manual Therapy Myofascial release;Soft tissue mobilization   Soft tissue mobilization to upper traps, rhomboids upper thoracic and cervical parapsinals.    Myofascial Release suboccipital release and gentle manual traction    Neck Exercises: Stretches   Other Neck Stretches cervical rotation x 3 reps each side, to tolerance.      Standing MFR with ball to traps/rhomboids of Lt x 4 min       PT Long Term Goals - 12/21/15 1312    PT LONG TERM GOAL #1   Title Improve posture and alignment with patient to demo more  upright posture of head on neck and shoulders 02/01/16   Time 6   Period Weeks   Status New   PT LONG TERM GOAL #2   Title Improve cervical ROM by 5-10 degrees 02/01/16   Time 6   Period Weeks   Status New   PT LONG TERM GOAL #3   Title patient to tolerate increased activity level for household chores - cooking and cleaning with increased endurance per pt report 02/01/16   Time 6   Period Weeks   Status New   PT LONG TERM GOAL #4   Title I in HEP 02/01/16   Time 6   Period Weeks   Status New   PT LONG TERM GOAL #5   Title Further assess and address BPPV as tolerated 3/131/7   Time 6   Period Weeks   Status New               Plan - 12/25/15 1429    Clinical Impression Statement Pt reported decrease of tightness in neck at end of session.  Pt had some  increased vertigo when sitting up at end of session.  Pt required some tactile cues for improved alignment with exercises.     Pt will benefit from skilled therapeutic intervention in order to improve on the following deficits Postural dysfunction;Improper body mechanics;Pain;Dizziness;Increased fascial restricitons;Decreased range of motion;Decreased mobility;Decreased endurance;Decreased activity tolerance   Rehab Potential Good   PT Frequency 2x / week   PT Treatment/Interventions Patient/family education;ADLs/Self Care Home Management;Therapeutic exercise;Therapeutic activities;Neuromuscular re-education;Manual techniques;Dry needling;Cryotherapy;Electrical Stimulation;Iontophoresis 4mg /ml Dexamethasone;Moist Heat;Ultrasound   PT Next Visit Plan continue assessment of BPPV as indicated/tolerated; postural correction; stretching; posterior shoulder girdle strengthening; manual work; TDN as indicated; modalities    Consulted and Agree with Plan of Care Patient        Problem List Patient Active Problem List   Diagnosis Date Noted  . Elevated alkaline phosphatase level 11/24/2015  . Primary osteoarthritis of both first carpometacarpal joints 09/22/2015  . Right shoulder pain 02/23/2015  . Chronic pain syndrome 02/06/2015  . Bilateral carpal tunnel syndrome 01/19/2015  . Dyssomnia 06/15/2014  . Left shoulder pain 06/15/2014  . Dry mouth 06/15/2014  . DDD (degenerative disc disease), lumbar 06/15/2014  . Insomnia 04/30/2014  . Actinic porokeratosis 02/27/2014  . ANA positive 01/24/2014  . History of arthroscopy of left shoulder 01/17/2014  . Strain of gastrocnemius tendon of right lower extremity 12/16/2013  . Osteoarthritis of both knees 11/11/2013  . Lumbar degenerative disc disease 11/11/2013  . Osteoarthritis, hip, bilateral 10/14/2013  . Lupus (systemic lupus erythematosus) ([place]) 05/29/2013  . Metatarsalgia of left foot 12/24/2012  . Chronic venous insufficiency 12/14/2012  .  Hyperlipemia 06/02/2011  . Atrophic vaginitis 06/01/2011  . Essential hypertension, benign 05/09/2011  . GERD (gastroesophageal reflux disease) 05/09/2011  . Varicose veins 05/09/2011  . Urinary incontinence 03/25/2011   Kerin Perna, PTA 12/25/2015 4:47 PM  [place], [place], [address] Phone: [phone]   Fax:  [phone]  Name: Rina Brinser MRN: [id number] Date of Birth: [birth year]

## 2015-12-29 ENCOUNTER — Encounter: Payer: Medicare Other | Admitting: Physical Therapy

## 2015-12-30 ENCOUNTER — Encounter: Payer: Self-pay | Admitting: Rehabilitative and Restorative Service Providers"

## 2015-12-30 ENCOUNTER — Ambulatory Visit (INDEPENDENT_AMBULATORY_CARE_PROVIDER_SITE_OTHER): Payer: Medicare Other | Admitting: Rehabilitative and Restorative Service Providers"

## 2015-12-30 DIAGNOSIS — R293 Abnormal posture: Secondary | ICD-10-CM

## 2015-12-30 DIAGNOSIS — M623 Immobility syndrome (paraplegic): Secondary | ICD-10-CM

## 2015-12-30 DIAGNOSIS — M542 Cervicalgia: Secondary | ICD-10-CM | POA: Diagnosis not present

## 2015-12-30 DIAGNOSIS — Z7409 Other reduced mobility: Secondary | ICD-10-CM | POA: Diagnosis not present

## 2015-12-30 DIAGNOSIS — H8110 Benign paroxysmal vertigo, unspecified ear: Secondary | ICD-10-CM | POA: Diagnosis not present

## 2015-12-30 DIAGNOSIS — R531 Weakness: Secondary | ICD-10-CM

## 2015-12-30 DIAGNOSIS — M2560 Stiffness of unspecified joint, not elsewhere classified: Secondary | ICD-10-CM

## 2015-12-30 NOTE — Patient Instructions (Signed)

## 2015-12-30 NOTE — Therapy (Signed)
[place], [place], [address] Phone: [phone]   Fax:  [phone]  Physical Therapy Treatment  Patient Details  Name: Katherine Nixon MRN: [id number] Date of Birth: [birth year] Referring Provider: Madilyn Fireman  Encounter Date: 12/30/2015      PT End of Session - 12/30/15 1515    Visit Number 3   Number of Visits 12   Date for PT Re-Evaluation 02/01/16   PT Start Time [id number]   PT Stop Time 1601   PT Time Calculation (min) 46 min   Activity Tolerance Patient tolerated treatment well;Patient limited by pain      Past Medical History  Diagnosis Date  . Squamous cell carcinoma [facility])     Past Surgical History  Procedure Laterality Date  . Nasal sinus surgery    . Laparoscopic cholecystectomy    . Breast surgery      biopsies/ aspirations - neg  . Dilation and curettage of uterus    . Varicose vein surgery    . Cholecystectomy    . Shoulder surgery Left 12/2014    Dr. Tamera Punt     There were no vitals filed for this visit.  Visit Diagnosis:  Cervical pain  BPPV (benign paroxysmal positional vertigo), unspecified laterality  Stiffness due to immobility  Abnormal posture  Decreased strength, endurance, and mobility      Subjective Assessment - 12/30/15 1518    Subjective Pt reports that she may be getting a little better. She has not had a headache in about a week but she still can't turn her head very well. Dizziness continues but not as severe in the past week.    Currently in Pain? Yes   Pain Score 4    Pain Location Neck   Pain Orientation Left;Right   Pain Descriptors / Indicators Aching;Sharp  with turning - otherwise aching    Pain Type Chronic pain   Pain Onset More than a month ago            [facility] PT Assessment - 12/30/15 0001    Assessment   Medical Diagnosis BPPV; cervical pain    Referring Provider Metheney   Onset Date/Surgical Date 10/06/15   Hand Dominance  Right   Next MD Visit 3/17   AROM   Cervical Flexion 38   Cervical Extension 35   Cervical - Right Side Bend 27   Cervical - Left Side Bend 21   Cervical - Right Rotation 52   Cervical - Left Rotation 41                     [facility] Adult PT Treatment/Exercise - 12/30/15 0001    Neuro Re-ed    Neuro Re-ed Details  working on posture and alignment    Neck Exercises: Standing   Neck Retraction 10 reps;10 secs   Neck Retraction Limitations tactile cues to improve form   Other Standing Exercises scap squeeze with noodle x 10 sec hold x 10 rep   Neck Exercises: Seated   Other Seated Exercise pulley 10 each arm in sitting    Shoulder Exercises: Standing   Extension Both;10 reps;Theraband   Theraband Level (Shoulder Extension) Level 1 (Yellow)   Row Both;10 reps;Theraband   Theraband Level (Shoulder Row) Level 1 (Yellow)   Retraction Both;10 reps;Theraband   Theraband Level (Shoulder Retraction) Level 1 (Yellow)   Moist Heat Therapy   Number Minutes Moist Heat 15 Minutes   Moist Heat Location Cervical  upper thoracic   Electrical Stimulation   Electrical Stimulation Location cervical paraspinals and levator attachment   Electrical Stimulation Action IFC   Electrical Stimulation Parameters to tolerance   Electrical Stimulation Goals Pain;Tone   Manual Therapy   Manual Therapy Myofascial release;Soft tissue mobilization   Manual therapy comments pt supine    Soft tissue mobilization to upper traps, rhomboids upper thoracic and cervical parapsinals.    Myofascial Release suboccipital release and gentle manual traction    Scapular Mobilization Lt scap mobs - noted TP in medial scapular border and through superior border of scap/trap/leveator   Manual Traction gentle cervical traction                 PT Education - 12/30/15 1551    Education provided Yes   Education Details cont postural correction; HEP   Person(s) Educated Patient   Methods  Explanation;Demonstration;Verbal cues;Tactile cues;Handout   Comprehension Verbalized understanding;Returned demonstration;Verbal cues required;Tactile cues required             PT Long Term Goals - 12/30/15 1554    PT LONG TERM GOAL #1   Title Improve posture and alignment with patient to demo more upright posture of head on neck and shoulders 02/01/16   Time 6   Period Weeks   Status On-going   PT LONG TERM GOAL #2   Title Improve cervical ROM by 5-10 degrees 02/01/16   Time 6   Period Weeks   Status On-going   PT LONG TERM GOAL #3   Title patient to tolerate increased activity level for household chores - cooking and cleaning with increased endurance per pt report 02/01/16   Time 6   Period Weeks   Status On-going   PT LONG TERM GOAL #4   Title I in HEP 02/01/16   Time 6   Period Weeks   Status On-going   PT LONG TERM GOAL #5   Title Further assess and address BPPV as tolerated 3/131/7   Time 6   Period Weeks   Status On-going               Plan - 12/30/15 1552    Clinical Impression Statement Pt reports no headaches and sever dizziness in ~ 1 week. She feels her neck is not as tight but it still hurts and is difficult to turn head to the side. Pt responds well to manual work. No significant changes noted in cervical mobility. Cont to work towarde stated goals of therapy.    Pt will benefit from skilled therapeutic intervention in order to improve on the following deficits Postural dysfunction;Improper body mechanics;Pain;Dizziness;Increased fascial restricitons;Decreased range of motion;Decreased mobility;Decreased endurance;Decreased activity tolerance   Rehab Potential Good   PT Frequency 2x / week   PT Duration 6 weeks   PT Treatment/Interventions Patient/family education;ADLs/Self Care Home Management;Therapeutic exercise;Therapeutic activities;Neuromuscular re-education;Manual techniques;Dry needling;Cryotherapy;Electrical Stimulation;Iontophoresis 4mg /ml  Dexamethasone;Moist Heat;Ultrasound   PT Next Visit Plan continue assessment of BPPV as indicated/tolerated; postural correction; stretching; posterior shoulder girdle strengthening; manual work; TDN as indicated; modalities    PT Home Exercise Plan postural correction; HEP    Consulted and Agree with Plan of Care Patient        Problem List Patient Active Problem List   Diagnosis Date Noted  . Elevated alkaline phosphatase level 11/24/2015  . Primary osteoarthritis of both first carpometacarpal joints 09/22/2015  . Right shoulder pain 02/23/2015  . Chronic pain syndrome 02/06/2015  . Bilateral carpal tunnel syndrome 01/19/2015  . Dyssomnia  06/15/2014  . Left shoulder pain 06/15/2014  . Dry mouth 06/15/2014  . DDD (degenerative disc disease), lumbar 06/15/2014  . Insomnia 04/30/2014  . Actinic porokeratosis 02/27/2014  . ANA positive 01/24/2014  . History of arthroscopy of left shoulder 01/17/2014  . Strain of gastrocnemius tendon of right lower extremity 12/16/2013  . Osteoarthritis of both knees 11/11/2013  . Lumbar degenerative disc disease 11/11/2013  . Osteoarthritis, hip, bilateral 10/14/2013  . Lupus (systemic lupus erythematosus) ([place]) 05/29/2013  . Metatarsalgia of left foot 12/24/2012  . Chronic venous insufficiency 12/14/2012  . Hyperlipemia 06/02/2011  . Atrophic vaginitis 06/01/2011  . Essential hypertension, benign 05/09/2011  . GERD (gastroesophageal reflux disease) 05/09/2011  . Varicose veins 05/09/2011  . Urinary incontinence 03/25/2011    Elward Nocera Nilda Simmer PT, MPH  12/30/2015, 3:56 PM  [facility] [place], [place], [address] Phone: [phone]   Fax:  [phone]  Name: Katherine Nixon MRN: [id number] Date of Birth: Aug 07, 1946

## 2015-12-31 ENCOUNTER — Encounter: Payer: Medicare Other | Admitting: Physical Therapy

## 2016-01-01 ENCOUNTER — Encounter: Payer: Self-pay | Admitting: Rehabilitative and Restorative Service Providers"

## 2016-01-01 ENCOUNTER — Ambulatory Visit (INDEPENDENT_AMBULATORY_CARE_PROVIDER_SITE_OTHER): Payer: Medicare Other | Admitting: Rehabilitative and Restorative Service Providers"

## 2016-01-01 DIAGNOSIS — M542 Cervicalgia: Secondary | ICD-10-CM | POA: Diagnosis not present

## 2016-01-01 DIAGNOSIS — H8110 Benign paroxysmal vertigo, unspecified ear: Secondary | ICD-10-CM

## 2016-01-01 DIAGNOSIS — R531 Weakness: Secondary | ICD-10-CM

## 2016-01-01 DIAGNOSIS — Z7409 Other reduced mobility: Secondary | ICD-10-CM

## 2016-01-01 DIAGNOSIS — R293 Abnormal posture: Secondary | ICD-10-CM | POA: Diagnosis not present

## 2016-01-01 DIAGNOSIS — M623 Immobility syndrome (paraplegic): Secondary | ICD-10-CM

## 2016-01-01 DIAGNOSIS — M2560 Stiffness of unspecified joint, not elsewhere classified: Secondary | ICD-10-CM

## 2016-01-01 NOTE — Patient Instructions (Signed)
Thoracic Lift    Press shoulders down. Then lift mid-thoracic spine (area between the shoulder blades). Lift the breastbone slightly.  Hold _5-10__ seconds. Relax. Repeat _10__ times. 2-3 times/day  Lying on back head supported on pillow. Gently turn head to side pause and return to midline Slowly turn to opposite side  Stop this exercise if you have any return of the dizziness

## 2016-01-01 NOTE — Therapy (Signed)
[place], [place], [address] Phone: [phone]   Fax:  [phone]  Physical Therapy Treatment  Patient Details  Name: Katherine Nixon MRN: [id number] Date of Birth: 23-Sep-1946 Referring Provider: Madilyn Fireman  Encounter Date: 01/01/2016      PT End of Session - 01/01/16 1150    Visit Number 4   Number of Visits 12   Date for PT Re-Evaluation 02/01/16   PT Start Time [id number]   PT Stop Time [id number]   PT Time Calculation (min) 54 min      Past Medical History  Diagnosis Date  . Squamous cell carcinoma [facility])     Past Surgical History  Procedure Laterality Date  . Nasal sinus surgery    . Laparoscopic cholecystectomy    . Breast surgery      biopsies/ aspirations - neg  . Dilation and curettage of uterus    . Varicose vein surgery    . Cholecystectomy    . Shoulder surgery Left 12/2014    Dr. Tamera Punt     There were no vitals filed for this visit.  Visit Diagnosis:  Cervical pain  BPPV (benign paroxysmal positional vertigo), unspecified laterality  Stiffness due to immobility  Abnormal posture  Decreased strength, endurance, and mobility      Subjective Assessment - 01/01/16 1151    Subjective A bad day today...not sure why. She has a headaches and her shoulders are hurting more. Dusted and made dinner yesterday. Dusted blinds.    Currently in Pain? Yes   Pain Score 5    Pain Location Neck   Pain Orientation Left;Right   Pain Descriptors / Indicators Aching;Sharp   Pain Onset More than a month ago   Pain Frequency Intermittent                         [facility] Adult PT Treatment/Exercise - 01/01/16 0001    Neuro Re-ed    Neuro Re-ed Details  working on posture and alignment    Neck Exercises: Standing   Neck Retraction 10 reps;10 secs  2 sets of 5   Neck Retraction Limitations tactile cues to improve form   Other Standing Exercises scap squeeze with noodle x 10  sec hold x 10 rep   Neck Exercises: Seated   Neck Retraction 10 reps;5 secs   Other Seated Exercise pulley 20 each arm in sitting    Neck Exercises: Supine   Other Supine Exercise thoracic lift 5 sec hold 10 reps    Shoulder Exercises: Standing   Extension Both;10 reps;Theraband   Theraband Level (Shoulder Extension) Level 1 (Yellow)   Row Both;10 reps;Theraband   Theraband Level (Shoulder Row) Level 1 (Yellow)   Retraction Both;10 reps;Theraband   Theraband Level (Shoulder Retraction) Level 1 (Yellow)   Moist Heat Therapy   Number Minutes Moist Heat 15 Minutes   Moist Heat Location Cervical  upper thoracic   Electrical Stimulation   Electrical Stimulation Location cervical paraspinals and levator attachment   Electrical Stimulation Action IFC   Electrical Stimulation Parameters to tolerance   Electrical Stimulation Goals Pain;Tone   Manual Therapy   Manual Therapy Myofascial release;Soft tissue mobilization   Manual therapy comments pt supine    Soft tissue mobilization to upper traps, rhomboids upper thoracic and cervical parapsinals.    Myofascial Release suboccipital release and gentle manual traction    Scapular Mobilization Lt scap mobs - noted TP in medial scapular border  and through superior border of scap/trap/leveator   Manual Traction gentle cervical traction                 PT Education - 01/01/16 1213    Education provided Yes   Education Details HEP   Person(s) Educated Patient   Methods Explanation;Demonstration;Tactile cues;Verbal cues;Handout   Comprehension Verbalized understanding;Returned demonstration;Verbal cues required;Tactile cues required             PT Long Term Goals - 12/30/15 1554    PT LONG TERM GOAL #1   Title Improve posture and alignment with patient to demo more upright posture of head on neck and shoulders 02/01/16   Time 6   Period Weeks   Status On-going   PT LONG TERM GOAL #2   Title Improve cervical ROM by 5-10  degrees 02/01/16   Time 6   Period Weeks   Status On-going   PT LONG TERM GOAL #3   Title patient to tolerate increased activity level for household chores - cooking and cleaning with increased endurance per pt report 02/01/16   Time 6   Period Weeks   Status On-going   PT LONG TERM GOAL #4   Title I in HEP 02/01/16   Time 6   Period Weeks   Status On-going   PT LONG TERM GOAL #5   Title Further assess and address BPPV as tolerated 3/131/7   Time 6   Period Weeks   Status On-going               Plan - 01/01/16 1227    Clinical Impression Statement Symptoms are increased today which may be related to household activities which included dusting the blinds yesterday. She has increased pain and significant increase in the muscular tightness through the cervical and shoulder girdle musculature. She may benefit from TDN to address trigger points in neck and shoulder.    Pt will benefit from skilled therapeutic intervention in order to improve on the following deficits Postural dysfunction;Improper body mechanics;Pain;Dizziness;Increased fascial restricitons;Decreased range of motion;Decreased mobility;Decreased endurance;Decreased activity tolerance   Rehab Potential Good   PT Frequency 2x / week   PT Duration 6 weeks   PT Treatment/Interventions Patient/family education;ADLs/Self Care Home Management;Therapeutic exercise;Therapeutic activities;Neuromuscular re-education;Manual techniques;Dry needling;Cryotherapy;Electrical Stimulation;Iontophoresis 4mg /ml Dexamethasone;Moist Heat;Ultrasound   PT Next Visit Plan continue assessment of BPPV as indicated/tolerated; postural correction; stretching; posterior shoulder girdle strengthening; manual work; TDN as indicated; modalities    PT Home Exercise Plan postural correction; HEP    Consulted and Agree with Plan of Care Patient        Problem List Patient Active Problem List   Diagnosis Date Noted  . Elevated alkaline phosphatase  level 11/24/2015  . Primary osteoarthritis of both first carpometacarpal joints 09/22/2015  . Right shoulder pain 02/23/2015  . Chronic pain syndrome 02/06/2015  . Bilateral carpal tunnel syndrome 01/19/2015  . Dyssomnia 06/15/2014  . Left shoulder pain 06/15/2014  . Dry mouth 06/15/2014  . DDD (degenerative disc disease), lumbar 06/15/2014  . Insomnia 04/30/2014  . Actinic porokeratosis 02/27/2014  . ANA positive 01/24/2014  . History of arthroscopy of left shoulder 01/17/2014  . Strain of gastrocnemius tendon of right lower extremity 12/16/2013  . Osteoarthritis of both knees 11/11/2013  . Lumbar degenerative disc disease 11/11/2013  . Osteoarthritis, hip, bilateral 10/14/2013  . Lupus (systemic lupus erythematosus) ([place]) 05/29/2013  . Metatarsalgia of left foot 12/24/2012  . Chronic venous insufficiency 12/14/2012  . Hyperlipemia 06/02/2011  . Atrophic vaginitis  06/01/2011  . Essential hypertension, benign 05/09/2011  . GERD (gastroesophageal reflux disease) 05/09/2011  . Varicose veins 05/09/2011  . Urinary incontinence 03/25/2011    Celyn Nilda Simmer PT, MPH  01/01/2016, 12:30 PM  [facility] [place], [place], [address] Phone: [phone]   Fax:  [phone]  Name: Katherine Nixon MRN: [id number] Date of Birth: 02-19-1946

## 2016-01-03 ENCOUNTER — Other Ambulatory Visit: Payer: Self-pay | Admitting: Sports Medicine

## 2016-01-04 ENCOUNTER — Ambulatory Visit (INDEPENDENT_AMBULATORY_CARE_PROVIDER_SITE_OTHER): Payer: Medicare Other | Admitting: Rehabilitative and Restorative Service Providers"

## 2016-01-04 ENCOUNTER — Encounter: Payer: Self-pay | Admitting: Rehabilitative and Restorative Service Providers"

## 2016-01-04 DIAGNOSIS — H8110 Benign paroxysmal vertigo, unspecified ear: Secondary | ICD-10-CM | POA: Diagnosis not present

## 2016-01-04 DIAGNOSIS — Z7409 Other reduced mobility: Secondary | ICD-10-CM | POA: Diagnosis not present

## 2016-01-04 DIAGNOSIS — R293 Abnormal posture: Secondary | ICD-10-CM

## 2016-01-04 DIAGNOSIS — M542 Cervicalgia: Secondary | ICD-10-CM

## 2016-01-04 DIAGNOSIS — M623 Immobility syndrome (paraplegic): Secondary | ICD-10-CM | POA: Diagnosis not present

## 2016-01-04 DIAGNOSIS — M2560 Stiffness of unspecified joint, not elsewhere classified: Secondary | ICD-10-CM

## 2016-01-04 DIAGNOSIS — R531 Weakness: Secondary | ICD-10-CM

## 2016-01-04 MED ORDER — OXYCODONE HCL 10 MG PO TABS: 10.0000 mg | ORAL_TABLET | Freq: Two times a day (BID) | ORAL | Status: DC

## 2016-01-04 NOTE — Telephone Encounter (Signed)
Routed to ordering Provider.

## 2016-01-04 NOTE — Therapy (Signed)
[place], [place], [address] Phone: [phone]   Fax:  [phone]  Physical Therapy Treatment  Patient Details  Name: Katherine Nixon MRN: [id number] Date of Birth: [birth year] Referring Provider: Dr. Madilyn Fireman  Encounter Date: 01/04/2016      PT End of Session - 01/04/16 1057    Visit Number 5   Number of Visits 12   Date for PT Re-Evaluation 02/01/16   PT Start Time 1057   PT Stop Time 1154   PT Time Calculation (min) 57 min   Activity Tolerance Patient limited by pain;Patient tolerated treatment well      Past Medical History  Diagnosis Date  . Squamous cell carcinoma [facility])     Past Surgical History  Procedure Laterality Date  . Nasal sinus surgery    . Laparoscopic cholecystectomy    . Breast surgery      biopsies/ aspirations - neg  . Dilation and curettage of uterus    . Varicose vein surgery    . Cholecystectomy    . Shoulder surgery Left 12/2014    Dr. Tamera Punt     There were no vitals filed for this visit.  Visit Diagnosis:  Cervical pain  BPPV (benign paroxysmal positional vertigo), unspecified laterality  Stiffness due to immobility  Abnormal posture  Decreased strength, endurance, and mobility      Subjective Assessment - 01/04/16 1101    Subjective Bad day yesterday but better today. Got her TENS unit Saturday and she is figuring out how to use the unit. She is not using the iPad as much thinking that may be related to the neck fatigue/pain. She has not had dizziness or headaches and she feels she can turn her head to the Lt with greater ease.    Currently in Pain? No/denies  some pain when seh turns her head to the [facility] PT Assessment - 01/04/16 0001    Assessment   Medical Diagnosis BPPV; cervical pain    Referring Provider Dr. Madilyn Fireman   Onset Date/Surgical Date 10/06/15   Hand Dominance Right   Next MD Visit 3/17   [facility]   Cervical  - Right Rotation 57   Cervical - Left Rotation 46   Palpation   Palpation comment ant/lat/post cervical musculature; upper trap; leveator; pecs; teres LT >> Rt                      [facility] Adult PT Treatment/Exercise - 01/04/16 0001    Neuro Re-ed    Neuro Re-ed Details  working on posture and alignment    Neck Exercises: Machines for Strengthening   UBE (Upper Arm Bike) L 0 - 3 min 1.5 fwd/back    Neck Exercises: Standing   Neck Retraction 10 reps;10 secs  2 sets of 5   Neck Retraction Limitations improving technique    Other Standing Exercises scap squeeze with noodle x 10 sec hold x 10 rep   Neck Exercises: Seated   Other Seated Exercise pulley 20 each arm in sitting    Shoulder Exercises: Standing   Extension Both;10 reps;Theraband   Theraband Level (Shoulder Extension) Level 1 (Yellow)   Row Both;10 reps;Theraband   Theraband Level (Shoulder Row) Level 1 (Yellow)   Retraction Both;Theraband;20 reps   Theraband Level (Shoulder Retraction) --  no resistance - TB increased pain    Other Standing Exercises scap squeeze with  noodle 10 sec x 10    Other Standing Exercises trial of pec stretch bilat and unilateral - unable to tolerate due to increased pain in shoulders    Moist Heat Therapy   Number Minutes Moist Heat 15 Minutes   Moist Heat Location Cervical  upper thoracic   Electrical Stimulation   Electrical Stimulation Location cervical paraspinals and levator attachment   Electrical Stimulation Action IFC   Electrical Stimulation Parameters to tolerance   Electrical Stimulation Goals Pain;Tone   Manual Therapy   Manual Therapy Myofascial release;Soft tissue mobilization   Manual therapy comments pt supine    Soft tissue mobilization to upper traps, rhomboids upper thoracic and cervical parapsinals; SCM; pecs    Myofascial Release suboccipital release and gentle manual traction    Scapular Mobilization Lt scap mobs - noted TP in medial scapular border and  through superior border of scap/trap/leveator   Manual Traction gentle cervical traction through much of the manual work to maintain cervical position in less cervical extension                      PT Long Term Goals - 12/30/15 1554    PT LONG TERM GOAL #1   Title Improve posture and alignment with patient to demo more upright posture of head on neck and shoulders 02/01/16   Time 6   Period Weeks   Status On-going   PT LONG TERM GOAL #2   Title Improve cervical ROM by 5-10 degrees 02/01/16   Time 6   Period Weeks   Status On-going   PT LONG TERM GOAL #3   Title patient to tolerate increased activity level for household chores - cooking and cleaning with increased endurance per pt report 02/01/16   Time 6   Period Weeks   Status On-going   PT LONG TERM GOAL #4   Title I in HEP 02/01/16   Time 6   Period Weeks   Status On-going   PT LONG TERM GOAL #5   Title Further assess and address BPPV as tolerated 3/131/7   Time 6   Period Weeks   Status On-going               Plan - 01/04/16 1255    Clinical Impression Statement Less pain today. Pt reports no episodes of sizziness or headaches since last week. There is less palpable tightness through the occipital and cervical paraspinals but she has continued tightness through this area as well as anterior cervical/lateral cervical/ pec musculature bilat Lt > Rt. She deomnstrates some improvement in cervical rotation and ie progressing gradually toward stated goals of therapy. Will benefit from trial of TDN to address chronic tightness and TP's in cervical and thoracic area.    Pt will benefit from skilled therapeutic intervention in order to improve on the following deficits Postural dysfunction;Improper body mechanics;Pain;Dizziness;Increased fascial restricitons;Decreased range of motion;Decreased mobility;Decreased endurance;Decreased activity tolerance   Rehab Potential Good   PT Frequency 2x / week   PT Duration 6  weeks   PT Treatment/Interventions Patient/family education;ADLs/Self Care Home Management;Therapeutic exercise;Therapeutic activities;Neuromuscular re-education;Manual techniques;Dry needling;Cryotherapy;Electrical Stimulation;Iontophoresis 4mg /ml Dexamethasone;Moist Heat;Ultrasound   PT Next Visit Plan continue assessment of BPPV as indicated/tolerated; postural correction; stretching; posterior shoulder girdle strengthening; manual work; TDN as indicated; modalities    PT Home Exercise Plan postural correction; HEP    Consulted and Agree with Plan of Care Patient        Problem List Patient Active Problem List  Diagnosis Date Noted  . Elevated alkaline phosphatase level 11/24/2015  . Primary osteoarthritis of both first carpometacarpal joints 09/22/2015  . Right shoulder pain 02/23/2015  . Chronic pain syndrome 02/06/2015  . Bilateral carpal tunnel syndrome 01/19/2015  . Dyssomnia 06/15/2014  . Left shoulder pain 06/15/2014  . Dry mouth 06/15/2014  . DDD (degenerative disc disease), lumbar 06/15/2014  . Insomnia 04/30/2014  . Actinic porokeratosis 02/27/2014  . ANA positive 01/24/2014  . History of arthroscopy of left shoulder 01/17/2014  . Strain of gastrocnemius tendon of right lower extremity 12/16/2013  . Osteoarthritis of both knees 11/11/2013  . Lumbar degenerative disc disease 11/11/2013  . Osteoarthritis, hip, bilateral 10/14/2013  . Lupus (systemic lupus erythematosus) ([place]) 05/29/2013  . Metatarsalgia of left foot 12/24/2012  . Chronic venous insufficiency 12/14/2012  . Hyperlipemia 06/02/2011  . Atrophic vaginitis 06/01/2011  . Essential hypertension, benign 05/09/2011  . GERD (gastroesophageal reflux disease) 05/09/2011  . Varicose veins 05/09/2011  . Urinary incontinence 03/25/2011    Katherine Nixon Katherine Nixon PT, MPH  01/04/2016, 1:00 PM  [facility] [place], [place], [address] Phone: [phone]    Fax:  [phone]  Name: Katherine Nixon MRN: [id number] Date of Birth: [birth year]

## 2016-01-06 ENCOUNTER — Ambulatory Visit (INDEPENDENT_AMBULATORY_CARE_PROVIDER_SITE_OTHER): Payer: Medicare Other | Admitting: Rehabilitative and Restorative Service Providers"

## 2016-01-06 ENCOUNTER — Encounter: Payer: Self-pay | Admitting: Rehabilitative and Restorative Service Providers"

## 2016-01-06 DIAGNOSIS — H8110 Benign paroxysmal vertigo, unspecified ear: Secondary | ICD-10-CM | POA: Diagnosis not present

## 2016-01-06 DIAGNOSIS — M542 Cervicalgia: Secondary | ICD-10-CM

## 2016-01-06 DIAGNOSIS — R6889 Other general symptoms and signs: Secondary | ICD-10-CM

## 2016-01-06 DIAGNOSIS — R531 Weakness: Secondary | ICD-10-CM

## 2016-01-06 DIAGNOSIS — M623 Immobility syndrome (paraplegic): Secondary | ICD-10-CM

## 2016-01-06 DIAGNOSIS — R293 Abnormal posture: Secondary | ICD-10-CM

## 2016-01-06 DIAGNOSIS — M2560 Stiffness of unspecified joint, not elsewhere classified: Secondary | ICD-10-CM

## 2016-01-06 DIAGNOSIS — Z7409 Other reduced mobility: Secondary | ICD-10-CM | POA: Diagnosis not present

## 2016-01-06 NOTE — Patient Instructions (Signed)
Arm Lengthener: Double    Arms at sides, hold yellow theraband between hands. Lift both arms over head to alongside ears, keeping elbows straight.  Hold _1-2__ seconds. Return arms to sides. 10 reps    Trigger Point Dry Needling  . What is Trigger Point Dry Needling (DN)? o DN is a physical therapy technique used to treat muscle pain and dysfunction. Specifically, DN helps deactivate muscle trigger points (muscle knots).  o A thin filiform needle is used to penetrate the skin and stimulate the underlying trigger point. The goal is for a local twitch response (LTR) to occur and for the trigger point to relax. No medication of any kind is injected during the procedure.   . What Does Trigger Point Dry Needling Feel Like?  o The procedure feels different for each individual patient. Some patients report that they do not actually feel the needle enter the skin and overall the process is not painful. Very mild bleeding may occur. However, many patients feel a deep cramping in the muscle in which the needle was inserted. This is the local twitch response.   Marland Kitchen How Will I feel after the treatment? o Soreness is normal, and the onset of soreness may not occur for a few hours. Typically this soreness does not last longer than two days.  o Bruising is uncommon, however; ice can be used to decrease any possible bruising.  o In rare cases feeling tired or nauseous after the treatment is normal. In addition, your symptoms may get worse before they get better, this period will typically not last longer than 24 hours.   . What Can I do After My Treatment? o Increase your hydration by drinking more water for the next 24 hours. o You may place ice or heat on the areas treated that have become sore, however, do not use heat on inflamed or bruised areas. Heat often brings more relief post needling. o You can continue your regular activities, but vigorous activity is not recommended initially after the treatment  for 24 hours. o DN is best combined with other physical therapy such as strengthening, stretching, and other therapies.

## 2016-01-06 NOTE — Therapy (Signed)
[place], [place], [address] Phone: [phone]   Fax:  [phone]  Physical Therapy Treatment  Patient Details  Name: Katherine Nixon MRN: [id number] Date of Birth: 05-Oct-1946 Referring Provider: Dr. Madilyn Fireman  Encounter Date: 01/06/2016      PT End of Session - 01/06/16 1103    Visit Number 6   Number of Visits 12   Date for PT Re-Evaluation 02/01/16   PT Start Time 1100   PT Stop Time 1201   PT Time Calculation (min) 61 min   Activity Tolerance Patient limited by pain;Patient tolerated treatment well      Past Medical History  Diagnosis Date  . Squamous cell carcinoma [facility])     Past Surgical History  Procedure Laterality Date  . Nasal sinus surgery    . Laparoscopic cholecystectomy    . Breast surgery      biopsies/ aspirations - neg  . Dilation and curettage of uterus    . Varicose vein surgery    . Cholecystectomy    . Shoulder surgery Left 12/2014    Dr. Tamera Punt     There were no vitals filed for this visit.  Visit Diagnosis:  Cervical pain  BPPV (benign paroxysmal positional vertigo), unspecified laterality  Stiffness due to immobility  Abnormal posture  Decreased strength, endurance, and mobility      Subjective Assessment - 01/06/16 1103    Subjective Patient reports that her headaches have improved and reports that her vertigo is resolved completely resolved since beginning therapy. She has good and bad days. Last night and this morning were bad but she feels better with medication.    Currently in Pain? Yes   Pain Score 4    Pain Location Neck   Pain Orientation Left;Right   Pain Descriptors / Indicators Aching;Sore  bruised feeling    Pain Type Chronic pain   Pain Onset More than a month ago   Pain Frequency Intermittent                         [facility] Adult PT Treatment/Exercise - 01/06/16 0001    Neuro Re-ed    Neuro Re-ed Details   working on posture and alignment    Neck Exercises: Machines for Strengthening   UBE (Upper Arm Bike) L 0 - 3 min 1.5 fwd/back    Neck Exercises: Standing   Neck Retraction 10 reps;10 secs  2 sets of 5   Other Standing Exercises scap squeeze with noodle x 10 sec hold x 10 rep   Neck Exercises: Seated   Neck Retraction 5 reps;5 secs   Other Seated Exercise pulley 20 reps each arm in sitting    Neck Exercises: Supine   Neck Retraction 5 reps;5 secs   Cervical Rotation 5 reps  head supported in neutral position on pillows slow mvt   Cervical Rotation Limitations to tolerance    Shoulder Flexion 10 reps  with yellow TB between hands shd width apart   Other Supine Exercise thoracic lift 10 sec hold 5 reps 2 sets    Shoulder Exercises: Standing   Extension Both;10 reps;Theraband   Theraband Level (Shoulder Extension) Level 1 (Yellow)   Row Both;10 reps;Theraband   Theraband Level (Shoulder Row) Level 1 (Yellow)   Retraction Both;Theraband;20 reps   Theraband Level (Shoulder Retraction) Level 1 (Yellow)   Other Standing Exercises scap squeeze with noodle 10 sec x 10    Other Standing  Exercises pec stretch unilateral at doorway lower position with hands at shoulder height 3 reps 15-20 sec hold    Moist Heat Therapy   Number Minutes Moist Heat 15 Minutes   Moist Heat Location Cervical  upper thoracic   Electrical Stimulation   Electrical Stimulation Location cervical paraspinals and levator attachment   Electrical Stimulation Action IFC   Electrical Stimulation Parameters to tolerance   Electrical Stimulation Goals Pain;Tone   Manual Therapy   Manual Therapy Myofascial release;Soft tissue mobilization   Manual therapy comments pt supine    Soft tissue mobilization to upper traps, rhomboids upper thoracic and cervical parapsinals; SCM; pecs    Myofascial Release suboccipital release and gentle manual traction    Scapular Mobilization Lt scap mobs - noted TP in medial scapular border  and through superior border of scap/trap/leveator   Manual Traction gentle cervical traction through much of the manual work to maintain cervical position in less cervical extension    Neck Exercises: Stretches   Other Neck Stretches cervical rotation x 3 reps each side, to tolerance.                 PT Education - 01/06/16 1144    Education provided Yes   Education Details HEP; TDN info provided    Person(s) Educated Patient   Methods Explanation;Demonstration;Tactile cues;Verbal cues;Handout   Comprehension Verbalized understanding;Returned demonstration;Verbal cues required;Tactile cues required             PT Long Term Goals - 12/30/15 1554    PT LONG TERM GOAL #1   Title Improve posture and alignment with patient to demo more upright posture of head on neck and shoulders 02/01/16   Time 6   Period Weeks   Status On-going   PT LONG TERM GOAL #2   Title Improve cervical ROM by 5-10 degrees 02/01/16   Time 6   Period Weeks   Status On-going   PT LONG TERM GOAL #3   Title patient to tolerate increased activity level for household chores - cooking and cleaning with increased endurance per pt report 02/01/16   Time 6   Period Weeks   Status On-going   PT LONG TERM GOAL #4   Title I in HEP 02/01/16   Time 6   Period Weeks   Status On-going   PT LONG TERM GOAL #5   Title Further assess and address BPPV as tolerated 3/131/7   Time 6   Period Weeks   Status On-going               Plan - 01/06/16 1201    Clinical Impression Statement Continues to improve gradually. No headaches/no dizziness; Continues to have muscular pain and tightness as sell as poor posture and alignment. Will benefit form trial of TDN. Continues to porgress toward stated goals of therapy.    Pt will benefit from skilled therapeutic intervention in order to improve on the following deficits Postural dysfunction;Improper body mechanics;Pain;Dizziness;Increased fascial restricitons;Decreased  range of motion;Decreased mobility;Decreased endurance;Decreased activity tolerance   Rehab Potential Good   PT Frequency 2x / week   PT Duration 6 weeks   PT Treatment/Interventions Patient/family education;ADLs/Self Care Home Management;Therapeutic exercise;Therapeutic activities;Neuromuscular re-education;Manual techniques;Dry needling;Cryotherapy;Electrical Stimulation;Iontophoresis 4mg /ml Dexamethasone;Moist Heat;Ultrasound   PT Next Visit Plan continue assessment of BPPV as indicated/tolerated; postural correction; stretching; posterior shoulder girdle strengthening; manual work; TDN as indicated; modalities    PT Home Exercise Plan postural correction; HEP    Consulted and Agree with Plan of Care  Patient        Problem List Patient Active Problem List   Diagnosis Date Noted  . Elevated alkaline phosphatase level 11/24/2015  . Primary osteoarthritis of both first carpometacarpal joints 09/22/2015  . Right shoulder pain 02/23/2015  . Chronic pain syndrome 02/06/2015  . Bilateral carpal tunnel syndrome 01/19/2015  . Dyssomnia 06/15/2014  . Left shoulder pain 06/15/2014  . Dry mouth 06/15/2014  . DDD (degenerative disc disease), lumbar 06/15/2014  . Insomnia 04/30/2014  . Actinic porokeratosis 02/27/2014  . ANA positive 01/24/2014  . History of arthroscopy of left shoulder 01/17/2014  . Strain of gastrocnemius tendon of right lower extremity 12/16/2013  . Osteoarthritis of both knees 11/11/2013  . Lumbar degenerative disc disease 11/11/2013  . Osteoarthritis, hip, bilateral 10/14/2013  . Lupus (systemic lupus erythematosus) ([place]) 05/29/2013  . Metatarsalgia of left foot 12/24/2012  . Chronic venous insufficiency 12/14/2012  . Hyperlipemia 06/02/2011  . Atrophic vaginitis 06/01/2011  . Essential hypertension, benign 05/09/2011  . GERD (gastroesophageal reflux disease) 05/09/2011  . Varicose veins 05/09/2011  . Urinary incontinence 03/25/2011    Celyn Nilda Simmer PT, MPH   01/06/2016, 12:04 PM  [facility] [place], [place], [address] Phone: [phone]   Fax:  [phone]  Name: Katherine Nixon MRN: [id number] Date of Birth: [birth year]

## 2016-01-09 ENCOUNTER — Other Ambulatory Visit: Payer: Self-pay | Admitting: Sports Medicine

## 2016-01-14 ENCOUNTER — Encounter: Payer: Self-pay | Admitting: Physical Therapy

## 2016-01-14 ENCOUNTER — Ambulatory Visit (INDEPENDENT_AMBULATORY_CARE_PROVIDER_SITE_OTHER): Payer: Medicare Other | Admitting: Physical Therapy

## 2016-01-14 DIAGNOSIS — R531 Weakness: Secondary | ICD-10-CM

## 2016-01-14 DIAGNOSIS — R293 Abnormal posture: Secondary | ICD-10-CM

## 2016-01-14 DIAGNOSIS — M623 Immobility syndrome (paraplegic): Secondary | ICD-10-CM | POA: Diagnosis not present

## 2016-01-14 DIAGNOSIS — R6889 Other general symptoms and signs: Secondary | ICD-10-CM

## 2016-01-14 DIAGNOSIS — M542 Cervicalgia: Secondary | ICD-10-CM | POA: Diagnosis not present

## 2016-01-14 DIAGNOSIS — Z7409 Other reduced mobility: Secondary | ICD-10-CM | POA: Diagnosis not present

## 2016-01-14 DIAGNOSIS — M2560 Stiffness of unspecified joint, not elsewhere classified: Secondary | ICD-10-CM

## 2016-01-14 NOTE — Therapy (Signed)
[place], [place], [address] Phone: [phone]   Fax:  [phone]  Physical Therapy Treatment  Patient Details  Name: Katherine Nixon MRN: [id number] Date of Birth: 07-Sep-1946 Referring Provider: Dr Madilyn Fireman  Encounter Date: 01/14/2016      PT End of Session - 01/14/16 1021    Visit Number 7   Number of Visits 12   Date for PT Re-Evaluation 02/01/16   PT Start Time 1021   PT Stop Time 1118   PT Time Calculation (min) 57 min   Activity Tolerance Patient tolerated treatment well      Past Medical History  Diagnosis Date  . Squamous cell carcinoma [facility])     Past Surgical History  Procedure Laterality Date  . Nasal sinus surgery    . Laparoscopic cholecystectomy    . Breast surgery      biopsies/ aspirations - neg  . Dilation and curettage of uterus    . Varicose vein surgery    . Cholecystectomy    . Shoulder surgery Left 12/2014    Dr. Tamera Punt     There were no vitals filed for this visit.  Visit Diagnosis:  Cervical pain  Stiffness due to immobility  Abnormal posture  Decreased strength, endurance, and mobility      Subjective Assessment - 01/14/16 1023    Subjective Kaislee doesn't have any more dizziness, she is having discomfort in the Lt upper trap   Currently in Pain? Yes   Pain Score 4   no pain when head is looking straight ahead.    Pain Location Neck   Pain Orientation Left   Pain Descriptors / Indicators Aching   Pain Type Chronic pain   Pain Onset More than a month ago   Pain Frequency Intermittent   Aggravating Factors  turning head   Pain Relieving Factors heat, meds            [facility] PT Assessment - 01/14/16 0001    Assessment   Medical Diagnosis BPPV; cervical pain    Referring Provider Dr Madilyn Fireman   Onset Date/Surgical Date 10/06/15   Hand Dominance Right   Next MD Visit 3/17   AROM   AROM Assessment Site Cervical   Cervical - Right  Rotation 54   Cervical - Left Rotation 48  after TDN & manual work 29                     [facility] Adult PT Treatment/Exercise - 01/14/16 0001    Neck Exercises: Machines for Strengthening   UBE (Upper Arm Bike) L1 x3' alt FWD/BWD   Neck Exercises: Standing   Other Standing Exercises doorway stretch low and mid.    Neck Exercises: Supine   Other Supine Exercise thoracic lifts x 10, 3 sec holds.    Modalities   Modalities Electrical Stimulation;Moist Heat   Moist Heat Therapy   Number Minutes Moist Heat 15 Minutes   Moist Heat Location Cervical   Electrical Stimulation   Electrical Stimulation Location cervical paraspinals and levator attachment   Electrical Stimulation Action premod   Electrical Stimulation Parameters to tolerance   Electrical Stimulation Goals Pain;Tone   Manual Therapy   Manual Therapy Soft tissue mobilization   Soft tissue mobilization bilat upper trap and pecs deep tissue work          Trigger Point Dry Needling - 01/14/16 1100    Consent Given? Yes   Education Handout Provided Yes  Muscles Treated Upper Body Upper trapezius  bilat    Upper Trapezius Response Twitch reponse elicited;Palpable increased muscle length                   PT Long Term Goals - 01/14/16 1046    PT LONG TERM GOAL #1   Title Improve posture and alignment with patient to demo more upright posture of head on neck and shoulders 02/01/16   Status On-going   PT LONG TERM GOAL #2   Title Improve cervical ROM by 5-10 degrees 02/01/16   Status On-going   PT LONG TERM GOAL #3   Title patient to tolerate increased activity level for household chores - cooking and cleaning with increased endurance per pt report 02/01/16   Status On-going   PT LONG TERM GOAL #4   Title I in HEP 02/01/16   Status On-going   PT LONG TERM GOAL #5   Title Further assess and address BPPV as tolerated 3/131/7   Status Achieved               Plan - 01/14/16 1108    Clinical  Impression Statement Latarshia continues to have decreasing symptoms, her muscles on the Rt side of the neck are tighter than the left however her pain is more on the left side. No new goals met, she would benefit from continued treatment.    Pt will benefit from skilled therapeutic intervention in order to improve on the following deficits Postural dysfunction;Improper body mechanics;Pain;Dizziness;Increased fascial restricitons;Decreased range of motion;Decreased mobility;Decreased endurance;Decreased activity tolerance   Rehab Potential Good   PT Frequency 2x / week   PT Duration 6 weeks   PT Treatment/Interventions Patient/family education;ADLs/Self Care Home Management;Therapeutic exercise;Therapeutic activities;Neuromuscular re-education;Manual techniques;Dry needling;Cryotherapy;Electrical Stimulation;Iontophoresis 34m/ml Dexamethasone;Moist Heat;Ultrasound   PT Home Exercise Plan postural correction; HEP assess response to TDN   Consulted and Agree with Plan of Care Patient        Problem List Patient Active Problem List   Diagnosis Date Noted  . Elevated alkaline phosphatase level 11/24/2015  . Primary osteoarthritis of both first carpometacarpal joints 09/22/2015  . Right shoulder pain 02/23/2015  . Chronic pain syndrome 02/06/2015  . Bilateral carpal tunnel syndrome 01/19/2015  . Dyssomnia 06/15/2014  . Left shoulder pain 06/15/2014  . Dry mouth 06/15/2014  . DDD (degenerative disc disease), lumbar 06/15/2014  . Insomnia 04/30/2014  . Actinic porokeratosis 02/27/2014  . ANA positive 01/24/2014  . History of arthroscopy of left shoulder 01/17/2014  . Strain of gastrocnemius tendon of right lower extremity 12/16/2013  . Osteoarthritis of both knees 11/11/2013  . Lumbar degenerative disc disease 11/11/2013  . Osteoarthritis, hip, bilateral 10/14/2013  . Lupus (systemic lupus erythematosus) ([place] 05/29/2013  . Metatarsalgia of left foot 12/24/2012  . Chronic venous  insufficiency 12/14/2012  . Hyperlipemia 06/02/2011  . Atrophic vaginitis 06/01/2011  . Essential hypertension, benign 05/09/2011  . GERD (gastroesophageal reflux disease) 05/09/2011  . Varicose veins 05/09/2011  . Urinary incontinence 03/25/2011    SJeral PinchPT 01/14/2016, 11:11 AM  [facility] [address]Phone: [phone]  Fax:  [phone] Name: BBelle CharlieMRN: [id number] of Birth: [birth year]

## 2016-01-15 ENCOUNTER — Ambulatory Visit: Payer: Medicare Other | Admitting: Gastroenterology

## 2016-01-18 ENCOUNTER — Encounter: Payer: Self-pay | Admitting: Rehabilitative and Restorative Service Providers"

## 2016-01-18 ENCOUNTER — Ambulatory Visit (INDEPENDENT_AMBULATORY_CARE_PROVIDER_SITE_OTHER): Payer: Medicare Other | Admitting: Rehabilitative and Restorative Service Providers"

## 2016-01-18 DIAGNOSIS — R531 Weakness: Secondary | ICD-10-CM

## 2016-01-18 DIAGNOSIS — M542 Cervicalgia: Secondary | ICD-10-CM | POA: Diagnosis not present

## 2016-01-18 DIAGNOSIS — Z7409 Other reduced mobility: Secondary | ICD-10-CM

## 2016-01-18 DIAGNOSIS — M623 Immobility syndrome (paraplegic): Secondary | ICD-10-CM | POA: Diagnosis not present

## 2016-01-18 DIAGNOSIS — H8110 Benign paroxysmal vertigo, unspecified ear: Secondary | ICD-10-CM | POA: Diagnosis not present

## 2016-01-18 DIAGNOSIS — R293 Abnormal posture: Secondary | ICD-10-CM | POA: Diagnosis not present

## 2016-01-18 DIAGNOSIS — M2560 Stiffness of unspecified joint, not elsewhere classified: Secondary | ICD-10-CM

## 2016-01-18 NOTE — Patient Instructions (Signed)
Scapular Retraction: Rowing (Eccentric) - Arms - 45 Degrees (Resistance Band)    Hold end of band in each hand. Pull back until elbows are even with trunk. Keep elbows out from sides at 45, thumbs up. Slowly release for 3-5 seconds.  Use yellow or red resistance band. _10__ reps per set, _2-3__ sets, 1 time per day

## 2016-01-18 NOTE — Therapy (Signed)
[place], [place], [address] Phone: [phone]   Fax:  [phone]  Physical Therapy Treatment  Patient Details  Name: Katherine Nixon MRN: [id number] Date of Birth: 1946/03/13 Referring Provider: Dr. Madilyn Fireman  Encounter Date: 01/18/2016      PT End of Session - 01/18/16 1524    Visit Number 8   Number of Visits 12   Date for PT Re-Evaluation 02/01/16   PT Start Time 1518   PT Stop Time 1616   PT Time Calculation (min) 58 min   Activity Tolerance Patient tolerated treatment well      Past Medical History  Diagnosis Date  . Squamous cell carcinoma [facility])     Past Surgical History  Procedure Laterality Date  . Nasal sinus surgery    . Laparoscopic cholecystectomy    . Breast surgery      biopsies/ aspirations - neg  . Dilation and curettage of uterus    . Varicose vein surgery    . Cholecystectomy    . Shoulder surgery Left 12/2014    Dr. Tamera Punt     There were no vitals filed for this visit.  Visit Diagnosis:  Cervical pain  Stiffness due to immobility  Abnormal posture  Decreased strength, endurance, and mobility  BPPV (benign paroxysmal positional vertigo), unspecified laterality      Subjective Assessment - 01/18/16 1523    Subjective Doing better - much less pain in the neck now.    Currently in Pain? No/denies            [facility] PT Assessment - 01/18/16 0001    Assessment   Medical Diagnosis BPPV; cervical pain    Referring Provider Dr. Madilyn Fireman   Onset Date/Surgical Date 10/06/15   Hand Dominance Right   Next MD Visit 3/17   AROM   Cervical - Right Rotation 64   Cervical - Left Rotation 52   Palpation   Palpation comment improved but persistent tightness through ant/lat/post cervical musculature; upper trap; leveator; pecs; teres                     [facility] Adult PT Treatment/Exercise - 01/18/16 0001    Neuro Re-ed    Neuro Re-ed Details   working on posture and alignment    Neck Exercises: Machines for Strengthening   UBE (Upper Arm Bike) L2 x4' alt FWD/BWD   Neck Exercises: Standing   Neck Retraction 10 reps;10 secs   Other Standing Exercises doorway stretch low and mid.    Neck Exercises: Seated   Other Seated Exercise 10 sec hold 10 reps    Neck Exercises: Supine   Neck Retraction 5 reps;5 secs   Cervical Rotation 5 reps  head supported in neutral on pillow    Shoulder Flexion 10 reps   Shoulder Exercises: Standing   Extension Both;Theraband;20 reps   Theraband Level (Shoulder Extension) Level 2 (Red)   Row Both;Theraband;20 reps  added row at 45 deg red TB x 10   Theraband Level (Shoulder Row) Level 2 (Red)   Retraction Both;Theraband;20 reps   Theraband Level (Shoulder Retraction) Level 1 (Yellow)   Other Standing Exercises scap squeeze with noodle 10 sec x 10    Other Standing Exercises pec stretch unilateral at doorway lower position with hands at shoulder height 3 reps 15-20 sec hold    Modalities   Modalities Electrical Stimulation;Moist Heat   Moist Heat Therapy   Number Minutes Moist Heat 15  Minutes   Moist Heat Location Cervical   Electrical Stimulation   Electrical Stimulation Location cervical paraspinals and levator attachment   Electrical Stimulation Action IFC   Electrical Stimulation Parameters to tolerance   Electrical Stimulation Goals Pain;Tone   Manual Therapy   Manual Therapy Soft tissue mobilization   Soft tissue mobilization bilat upper trap and pecs deep tissue work   Myofascial Release suboccipital release and gentle manual traction    Scapular Mobilization Lt scap mobs - noted TP in medial scapular border and through superior border of scap/trap/leveator   Manual Traction gentle cervical traction through much of the manual work to maintain cervical position in less cervical extension                 PT Education - 01/18/16 1553    Education provided Yes   Education  Details HEP    Person(s) Educated Patient   Methods Explanation;Demonstration;Tactile cues;Verbal cues;Handout   Comprehension Verbalized understanding;Returned demonstration;Verbal cues required;Tactile cues required             PT Long Term Goals - 01/18/16 1704    PT LONG TERM GOAL #1   Title Improve posture and alignment with patient to demo more upright posture of head on neck and shoulders 02/01/16   Time 6   Period Weeks   Status Achieved   PT LONG TERM GOAL #2   Title Improve cervical ROM by 5-10 degrees 02/01/16   Time 6   Period Weeks   Status Partially Met  good gains in cervical rotation   PT LONG TERM GOAL #3   Title patient to tolerate increased activity level for household chores - cooking and cleaning with increased endurance per pt report 02/01/16   Time 6   Period Weeks   Status Partially Met  progressing well with return to more functional activities and no longer has dizziness limiting activities   PT LONG TERM GOAL #4   Title I in HEP 02/01/16   Time 6   Period Weeks   Status On-going   PT LONG TERM GOAL #5   Title Further assess and address BPPV as tolerated 3/131/7   Time 6   Period Weeks   Status Achieved  diziness resolved for ~3-4 weeks now                Plan - 01/18/16 1612    Clinical Impression Statement Continued improvement with less pain and tightness and increased ROM in cervical spine. Patient has continued muscular tightness through cervical and shoulder region bilaterally which has responded well to manual work and TDN. Continued tightness notedthrough SCM; splenius cervicus/capitus; upper traps; leveator; pecs.  She is progressing toward stated goals of therapy.    Pt will benefit from skilled therapeutic intervention in order to improve on the following deficits Postural dysfunction;Improper body mechanics;Pain;Dizziness;Increased fascial restricitons;Decreased range of motion;Decreased mobility;Decreased endurance;Decreased  activity tolerance   Rehab Potential Good   PT Frequency 2x / week   PT Duration 6 weeks   PT Treatment/Interventions Patient/family education;ADLs/Self Care Home Management;Therapeutic exercise;Therapeutic activities;Neuromuscular re-education;Manual techniques;Dry needling;Cryotherapy;Electrical Stimulation;Iontophoresis 46m/ml Dexamethasone;Moist Heat;Ultrasound   PT Next Visit Plan continue assessment of BPPV as indicated/tolerated; postural correction; stretching; posterior shoulder girdle strengthening; manual work; TDN as indicated; modalities    PT Home Exercise Plan postural correction; HEP; TDN at next visit as indicated    Consulted and Agree with Plan of Care Patient        Problem List Patient Active Problem List  Diagnosis Date Noted  . Elevated alkaline phosphatase level 11/24/2015  . Primary osteoarthritis of both first carpometacarpal joints 09/22/2015  . Right shoulder pain 02/23/2015  . Chronic pain syndrome 02/06/2015  . Bilateral carpal tunnel syndrome 01/19/2015  . Dyssomnia 06/15/2014  . Left shoulder pain 06/15/2014  . Dry mouth 06/15/2014  . DDD (degenerative disc disease), lumbar 06/15/2014  . Insomnia 04/30/2014  . Actinic porokeratosis 02/27/2014  . ANA positive 01/24/2014  . History of arthroscopy of left shoulder 01/17/2014  . Strain of gastrocnemius tendon of right lower extremity 12/16/2013  . Osteoarthritis of both knees 11/11/2013  . Lumbar degenerative disc disease 11/11/2013  . Osteoarthritis, hip, bilateral 10/14/2013  . Lupus (systemic lupus erythematosus) ([place]) 05/29/2013  . Metatarsalgia of left foot 12/24/2012  . Chronic venous insufficiency 12/14/2012  . Hyperlipemia 06/02/2011  . Atrophic vaginitis 06/01/2011  . Essential hypertension, benign 05/09/2011  . GERD (gastroesophageal reflux disease) 05/09/2011  . Varicose veins 05/09/2011  . Urinary incontinence 03/25/2011    Amun Stemm Nilda Simmer PT, MPH  01/18/2016, 5:06 PM  [facility] [place], [place], [address] Phone: [phone]   Fax:  [phone]  Name: Laterria Lasota MRN: [id number] Date of Birth: [birth year]

## 2016-01-20 ENCOUNTER — Ambulatory Visit (INDEPENDENT_AMBULATORY_CARE_PROVIDER_SITE_OTHER): Payer: Medicare Other | Admitting: Physical Therapy

## 2016-01-20 DIAGNOSIS — M542 Cervicalgia: Secondary | ICD-10-CM | POA: Diagnosis not present

## 2016-01-20 DIAGNOSIS — M623 Immobility syndrome (paraplegic): Secondary | ICD-10-CM | POA: Diagnosis not present

## 2016-01-20 DIAGNOSIS — R293 Abnormal posture: Secondary | ICD-10-CM | POA: Diagnosis not present

## 2016-01-20 DIAGNOSIS — R531 Weakness: Secondary | ICD-10-CM

## 2016-01-20 DIAGNOSIS — Z7409 Other reduced mobility: Secondary | ICD-10-CM | POA: Diagnosis not present

## 2016-01-20 DIAGNOSIS — M2560 Stiffness of unspecified joint, not elsewhere classified: Secondary | ICD-10-CM

## 2016-01-20 NOTE — Therapy (Signed)
[place], [place], [address] Phone: [phone]   Fax:  [phone]  Physical Therapy Treatment  Patient Details  Name: Katherine Nixon MRN: [id number] Date of Birth: 1946/10/02 Referring Provider: Dr Madilyn Fireman  Encounter Date: 01/20/2016      PT End of Session - 01/20/16 1403    Visit Number 9   Number of Visits 12   Date for PT Re-Evaluation 02/01/16   PT Start Time 0175   PT Stop Time 1455   PT Time Calculation (min) 52 min   Activity Tolerance Patient tolerated treatment well      Past Medical History  Diagnosis Date  . Squamous cell carcinoma [facility])     Past Surgical History  Procedure Laterality Date  . Nasal sinus surgery    . Laparoscopic cholecystectomy    . Breast surgery      biopsies/ aspirations - neg  . Dilation and curettage of uterus    . Varicose vein surgery    . Cholecystectomy    . Shoulder surgery Left 12/2014    Dr. Tamera Punt     There were no vitals filed for this visit.  Visit Diagnosis:  Cervical pain  Stiffness due to immobility  Abnormal posture  Decreased strength, endurance, and mobility      Subjective Assessment - 01/20/16 1405    Subjective Katherine Nixon feels a sharp pain in the Lt side of her neck when she turns to the Lt    Currently in Pain? No/denies  3/10 when she turns to the Lt    Pain Frequency Intermittent            [facility] PT Assessment - 01/20/16 0001    Assessment   Medical Diagnosis BPPV; cervical pain    Referring Provider Dr Madilyn Fireman   Onset Date/Surgical Date 10/06/15   Hand Dominance Right   Next MD Visit 3/17   AROM   AROM Assessment Site Cervical   Cervical - Right Rotation 65  70 after TDN   Cervical - Left Rotation 72  75 after TDN                     [facility] Adult PT Treatment/Exercise - 01/20/16 0001    Neck Exercises: Machines for Strengthening   UBE (Upper Arm Bike) L2x4" alt FWD/BWD   Neck  Exercises: Standing   Other Standing Exercises doorway stretch low and mid.    Modalities   Modalities Electrical Stimulation;Moist Heat   Moist Heat Therapy   Number Minutes Moist Heat 15 Minutes   Moist Heat Location Cervical   Electrical Stimulation   Electrical Stimulation Location cervical paraspinals and levator attachment   Electrical Stimulation Action IFC   Electrical Stimulation Parameters  to tolerance   Electrical Stimulation Goals Pain;Tone   Manual Therapy   Manual Therapy Soft tissue mobilization;Joint mobilization   Joint Mobilization bilat shoulders post mobs grade III after TDN to pecs   Soft tissue mobilization bilat pecs, Rt SCM and upper traps  small bruise forming next to SCM with edema.           Trigger Point Dry Needling - 01/20/16 1408    Consent Given? Yes   Muscles Treated Upper Body Upper trapezius;Pectoralis major  SCM Rt - pt with small swollen and bruised area next to 1 area the was needled.    Upper Trapezius Response Twitch reponse elicited;Palpable increased muscle length   Pectoralis Major Response Palpable increased muscle length;Twitch  response elicited                   PT Long Term Goals - 01/20/16 1407    PT LONG TERM GOAL #1   Title Improve posture and alignment with patient to demo more upright posture of head on neck and shoulders 02/01/16   Status Achieved   PT LONG TERM GOAL #2   Title Improve cervical ROM by 5-10 degrees 02/01/16   PT LONG TERM GOAL #3   Title patient to tolerate increased activity level for household chores - cooking and cleaning with increased endurance per pt report 02/01/16   Status Partially Met   PT LONG TERM GOAL #4   Title I in HEP 02/01/16   Status On-going   PT LONG TERM GOAL #5   Title Further assess and address BPPV as tolerated 3/131/7   Status Achieved               Plan - 01/20/16 1544    Clinical Impression Statement Katherine Nixon is very tight in her upper traps bilat.  With her  musculoskeletal changes she may have reoccurrences of this over time that would require more treatment.  She does respond well to TDN with manual therapy.  Her pecs are less tight.    Pt will benefit from skilled therapeutic intervention in order to improve on the following deficits Postural dysfunction;Improper body mechanics;Pain;Dizziness;Increased fascial restricitons;Decreased range of motion;Decreased mobility;Decreased endurance;Decreased activity tolerance   Rehab Potential Good   PT Frequency 2x / week   PT Duration 6 weeks   PT Treatment/Interventions Patient/family education;ADLs/Self Care Home Management;Therapeutic exercise;Therapeutic activities;Neuromuscular re-education;Manual techniques;Dry needling;Cryotherapy;Electrical Stimulation;Iontophoresis 81m/ml Dexamethasone;Moist Heat;Ultrasound   PT Next Visit Plan continue assessment of BPPV as indicated/tolerated; postural correction; stretching; posterior shoulder girdle strengthening; manual work; TDN as indicated; modalities    Consulted and Agree with Plan of Care Patient        Problem List Patient Active Problem List   Diagnosis Date Noted  . Elevated alkaline phosphatase level 11/24/2015  . Primary osteoarthritis of both first carpometacarpal joints 09/22/2015  . Right shoulder pain 02/23/2015  . Chronic pain syndrome 02/06/2015  . Bilateral carpal tunnel syndrome 01/19/2015  . Dyssomnia 06/15/2014  . Left shoulder pain 06/15/2014  . Dry mouth 06/15/2014  . DDD (degenerative disc disease), lumbar 06/15/2014  . Insomnia 04/30/2014  . Actinic porokeratosis 02/27/2014  . ANA positive 01/24/2014  . History of arthroscopy of left shoulder 01/17/2014  . Strain of gastrocnemius tendon of right lower extremity 12/16/2013  . Osteoarthritis of both knees 11/11/2013  . Lumbar degenerative disc disease 11/11/2013  . Osteoarthritis, hip, bilateral 10/14/2013  . Lupus (systemic lupus erythematosus) ([place] 05/29/2013  .  Metatarsalgia of left foot 12/24/2012  . Chronic venous insufficiency 12/14/2012  . Hyperlipemia 06/02/2011  . Atrophic vaginitis 06/01/2011  . Essential hypertension, benign 05/09/2011  . GERD (gastroesophageal reflux disease) 05/09/2011  . Varicose veins 05/09/2011  . Urinary incontinence 03/25/2011    SJeral PinchPT 01/20/2016, 3:54 PM  [facility] [address]Phone: [phone]  Fax:  [phone] Name: Katherine VeronicaMRN: [id number] of Birth: [birth year]

## 2016-01-22 ENCOUNTER — Telehealth: Payer: Self-pay | Admitting: *Deleted

## 2016-01-22 NOTE — Telephone Encounter (Signed)
Pt called and lvm asking that her mail order pharmacy be changed to [facility]. This has already been switched.Katherine Nixon

## 2016-01-23 ENCOUNTER — Other Ambulatory Visit: Payer: Self-pay | Admitting: Family Medicine

## 2016-01-25 ENCOUNTER — Encounter: Payer: Self-pay | Admitting: Rehabilitative and Restorative Service Providers"

## 2016-01-25 ENCOUNTER — Ambulatory Visit (INDEPENDENT_AMBULATORY_CARE_PROVIDER_SITE_OTHER): Payer: Medicare Other | Admitting: Rehabilitative and Restorative Service Providers"

## 2016-01-25 DIAGNOSIS — R531 Weakness: Secondary | ICD-10-CM

## 2016-01-25 DIAGNOSIS — R293 Abnormal posture: Secondary | ICD-10-CM | POA: Diagnosis not present

## 2016-01-25 DIAGNOSIS — M623 Immobility syndrome (paraplegic): Secondary | ICD-10-CM | POA: Diagnosis not present

## 2016-01-25 DIAGNOSIS — Z7409 Other reduced mobility: Secondary | ICD-10-CM

## 2016-01-25 DIAGNOSIS — H8110 Benign paroxysmal vertigo, unspecified ear: Secondary | ICD-10-CM | POA: Diagnosis not present

## 2016-01-25 DIAGNOSIS — M542 Cervicalgia: Secondary | ICD-10-CM | POA: Diagnosis not present

## 2016-01-25 DIAGNOSIS — R6889 Other general symptoms and signs: Secondary | ICD-10-CM

## 2016-01-25 DIAGNOSIS — M2560 Stiffness of unspecified joint, not elsewhere classified: Secondary | ICD-10-CM

## 2016-01-25 NOTE — Therapy (Signed)
[place], [place], [address] Phone: [phone]   Fax:  [phone]  Physical Therapy Treatment  Patient Details  Name: Katherine Nixon MRN: [id number] Date of Birth: 01/17/1946 Referring Provider: Dr. Madilyn Fireman  Encounter Date: 01/25/2016      PT End of Session - 01/25/16 1526    Visit Number 10   Number of Visits 12   Date for PT Re-Evaluation 02/01/16   PT Start Time 5003   PT Stop Time 1607   PT Time Calculation (min) 53 min   Activity Tolerance Patient tolerated treatment well      Past Medical History  Diagnosis Date  . Squamous cell carcinoma [facility])     Past Surgical History  Procedure Laterality Date  . Nasal sinus surgery    . Laparoscopic cholecystectomy    . Breast surgery      biopsies/ aspirations - neg  . Dilation and curettage of uterus    . Varicose vein surgery    . Cholecystectomy    . Shoulder surgery Left 12/2014    Dr. Tamera Punt     There were no vitals filed for this visit.  Visit Diagnosis:  Cervical pain  Stiffness due to immobility  Abnormal posture  Decreased strength, endurance, and mobility  BPPV (benign paroxysmal positional vertigo), unspecified laterality      Subjective Assessment - 01/25/16 1531    Subjective Pt reports that she had a "golf ball" like bruise on her neck (Rt) following TDN last visit. It is resolving and did not cause any problem. she is pleased with progress in PT. She no longer has difficulty with headaches; dizzyness and the sharp pain in her neck that she used to have. She feels her posture is improving and notices that her balance is improved as well.    Currently in Pain? Yes   Pain Score 3    Pain Orientation Left;Right   Pain Type Chronic pain   Pain Onset More than a month ago   Pain Frequency Intermittent            [facility] PT Assessment - 01/25/16 0001    Assessment   Medical Diagnosis BPPV; cervical pain     Referring Provider Dr. Madilyn Fireman   Onset Date/Surgical Date 10/06/15   Hand Dominance Right   Next MD Visit 3/17   AROM   AROM Assessment Site Cervical   Palpation   Palpation comment improved but persistent tightness through ant/lat/post cervical musculature; upper trap; leveator; pecs; teres                     [facility] Adult PT Treatment/Exercise - 01/25/16 0001    Neck Exercises: Machines for Strengthening   UBE (Upper Arm Bike) L2x4" alt FWD/BWD   Neck Exercises: Standing   Neck Retraction 10 reps;10 secs   Neck Retraction Limitations improving technique    Other Standing Exercises doorway stretch low and mid unilateral to tolerance   Neck Exercises: Supine   Neck Retraction 5 reps;5 secs   Cervical Rotation 5 reps  head supported on pillow close to neutral    Shoulder Exercises: Standing   Extension Both;Theraband;20 reps   Theraband Level (Shoulder Extension) Level 2 (Red)   Row Both;Theraband;20 reps   Theraband Level (Shoulder Row) Level 2 (Red)   Retraction Both;Theraband;20 reps   Theraband Level (Shoulder Retraction) Level 1 (Yellow)   Other Standing Exercises scap squeeze with noodle 10 sec x 10 x 2  sets    Other Standing Exercises pec stretch unilateral at doorway lower position with hands at shoulder height 3 reps 15-20 sec hold    Modalities   Modalities Electrical Stimulation;Moist Heat   Moist Heat Therapy   Number Minutes Moist Heat 15 Minutes   Moist Heat Location Cervical   Electrical Stimulation   Electrical Stimulation Location cervical paraspinals and levator attachment   Electrical Stimulation Action IFC   Electrical Stimulation Parameters to tolerance   Electrical Stimulation Goals Pain;Tone   Manual Therapy   Manual Therapy Soft tissue mobilization;Joint mobilization   Manual therapy comments pt supine    Joint Mobilization thoracic and cervical spine with  grade II and III glides    Soft tissue mobilization posteriuor cervical and upper  trap to pec musculature avoiding area in the anterior cervical musculature in area of bruising Rt SCM   Myofascial Release suboccipital release and gentle manual traction    Manual Traction gentle cervical traction through much of the manual work to maintain cervical position in less cervical extension                 PT Education - 01/25/16 1606    Education provided Yes   Education Details continue to work on posture and alignment; working on HEP    Person(s) Educated Patient   Methods Explanation   Comprehension Verbalized understanding             PT Long Term Goals - 01/25/16 1657    PT LONG TERM GOAL #1   Title Improve posture and alignment with patient to demo more upright posture of head on neck and shoulders 02/01/16   Time 6   Period Weeks   Status Achieved   PT LONG TERM GOAL #2   Title Improve cervical ROM by 5-10 degrees 02/01/16   Time 6   Period Weeks   Status Partially Met  good gains in cervical ROM in rotation   PT LONG TERM GOAL #3   Title patient to tolerate increased activity level for household chores - cooking and cleaning with increased endurance per pt report 02/01/16   Time 6   Period Weeks   Status Partially Met   PT LONG TERM GOAL #4   Title I in HEP 02/01/16   Time 6   Period Weeks   Status On-going   PT LONG TERM GOAL #5   Title Further assess and address BPPV as tolerated 3/131/7   Time 6   Period Weeks   Status Achieved               Plan - 01/25/16 1608    Clinical Impression Statement Note improved muscular tightness through upper traps and cervical musculature. Pt does have resolving area of bruising at Rt SCM following TDN. Pt demonstrates good improvement in posture and alignment as well as increase in ROM. She has resolution of headaches; dizziness and sharp shooting pains in the neck area. She progressing well toward stated goals of therapy. Benefits form TDN and manual therapy. Anticipate d/c following 1-2 visits.     Pt will benefit from skilled therapeutic intervention in order to improve on the following deficits Postural dysfunction;Improper body mechanics;Pain;Dizziness;Increased fascial restricitons;Decreased range of motion;Decreased mobility;Decreased endurance;Decreased activity tolerance   Rehab Potential Good   PT Frequency 2x / week   PT Duration 6 weeks   PT Treatment/Interventions Patient/family education;ADLs/Self Care Home Management;Therapeutic exercise;Therapeutic activities;Neuromuscular re-education;Manual techniques;Dry needling;Cryotherapy;Electrical Stimulation;Iontophoresis 4mg/ml Dexamethasone;Moist Heat;Ultrasound   PT Next Visit   Plan continue assessment of BPPV as indicated/tolerated; postural correction; stretching; posterior shoulder girdle strengthening; manual work; TDN as indicated; modalities; assess for d/c vs continue for 1 additional visit.    PT Home Exercise Plan postural correction; HEP; TDN at next visit as indicated    Consulted and Agree with Plan of Care Patient        Problem List Patient Active Problem List   Diagnosis Date Noted  . Elevated alkaline phosphatase level 11/24/2015  . Primary osteoarthritis of both first carpometacarpal joints 09/22/2015  . Right shoulder pain 02/23/2015  . Chronic pain syndrome 02/06/2015  . Bilateral carpal tunnel syndrome 01/19/2015  . Dyssomnia 06/15/2014  . Left shoulder pain 06/15/2014  . Dry mouth 06/15/2014  . DDD (degenerative disc disease), lumbar 06/15/2014  . Insomnia 04/30/2014  . Actinic porokeratosis 02/27/2014  . ANA positive 01/24/2014  . History of arthroscopy of left shoulder 01/17/2014  . Strain of gastrocnemius tendon of right lower extremity 12/16/2013  . Osteoarthritis of both knees 11/11/2013  . Lumbar degenerative disc disease 11/11/2013  . Osteoarthritis, hip, bilateral 10/14/2013  . Lupus (systemic lupus erythematosus) ([facility]) 05/29/2013  . Metatarsalgia of left foot 12/24/2012  . Chronic  venous insufficiency 12/14/2012  . Hyperlipemia 06/02/2011  . Atrophic vaginitis 06/01/2011  . Essential hypertension, benign 05/09/2011  . GERD (gastroesophageal reflux disease) 05/09/2011  . Varicose veins 05/09/2011  . Urinary incontinence 03/25/2011     P  PT, MPH  01/25/2016, 5:02 PM  [facility] [address] , [place], [address] Phone: [phone]   Fax:  [phone]  Name: Katherine Nixon MRN: [id number] Date of Birth: 01/12/1946     

## 2016-01-26 ENCOUNTER — Other Ambulatory Visit: Payer: Self-pay | Admitting: Sports Medicine

## 2016-01-26 ENCOUNTER — Other Ambulatory Visit: Payer: Self-pay | Admitting: Family Medicine

## 2016-01-26 ENCOUNTER — Other Ambulatory Visit: Payer: Self-pay | Admitting: *Deleted

## 2016-01-26 MED ORDER — FLUTICASONE PROPIONATE 50 MCG/ACT NA SUSP: 2.0000 | Freq: Every day | NASAL | Status: DC

## 2016-01-26 MED ORDER — IPRATROPIUM BROMIDE 0.03 % NA SOLN: 2.0000 | Freq: Two times a day (BID) | NASAL | Status: DC

## 2016-01-28 ENCOUNTER — Ambulatory Visit (INDEPENDENT_AMBULATORY_CARE_PROVIDER_SITE_OTHER): Payer: Medicare Other | Admitting: Physical Therapy

## 2016-01-28 DIAGNOSIS — R531 Weakness: Secondary | ICD-10-CM

## 2016-01-28 DIAGNOSIS — M623 Immobility syndrome (paraplegic): Secondary | ICD-10-CM

## 2016-01-28 DIAGNOSIS — Z7409 Other reduced mobility: Secondary | ICD-10-CM

## 2016-01-28 DIAGNOSIS — R293 Abnormal posture: Secondary | ICD-10-CM

## 2016-01-28 DIAGNOSIS — M2560 Stiffness of unspecified joint, not elsewhere classified: Secondary | ICD-10-CM

## 2016-01-28 DIAGNOSIS — R6889 Other general symptoms and signs: Secondary | ICD-10-CM

## 2016-01-28 NOTE — Therapy (Signed)
[place], [place], [address] Phone: [phone]   Fax:  [phone]  Physical Therapy Treatment  Patient Details  Name: Katherine Nixon MRN: [id number] Date of Birth: [birth year] Referring Provider: Dr Madilyn Fireman  Encounter Date: 01/28/2016      PT End of Session - 01/28/16 1443    Visit Number 11   Number of Visits 12   Date for PT Re-Evaluation 02/01/16   PT Start Time 0350   PT Stop Time 1531   PT Time Calculation (min) 48 min   Activity Tolerance Patient tolerated treatment well      Past Medical History  Diagnosis Date  . Squamous cell carcinoma [facility])     Past Surgical History  Procedure Laterality Date  . Nasal sinus surgery    . Laparoscopic cholecystectomy    . Breast surgery      biopsies/ aspirations - neg  . Dilation and curettage of uterus    . Varicose vein surgery    . Cholecystectomy    . Shoulder surgery Left 12/2014    Dr. Tamera Punt     There were no vitals filed for this visit.  Visit Diagnosis:  Stiffness due to immobility  Abnormal posture  Decreased strength, endurance, and mobility      Subjective Assessment - 01/28/16 1446    Subjective Marnette reports she is doing well, at her baseline and feels like she can continue with her HEP and maintain where she is. Reports 80% improvement since starting PT    Currently in Pain? No/denies            [facility] PT Assessment - 01/28/16 0001    Assessment   Medical Diagnosis BPPV; cervical pain    Referring Provider Dr Madilyn Fireman   Onset Date/Surgical Date 10/06/15   Hand Dominance Right   Next MD Visit 3/17   AROM   AROM Assessment Site Cervical   Cervical - Right Rotation 59   Cervical - Left Rotation 59   Strength   Overall Strength Comments bilat UE's grossly 5-/5 except shoulder ER 4/5 bilat.                      [facility] Adult PT Treatment/Exercise - 01/28/16 0001    Neck Exercises: Machines  for Strengthening   UBE (Upper Arm Bike) L3 x 4' alt FWD/BWD   Neck Exercises: Standing   Other Standing Exercises cervical side bend stretches   Neck Exercises: Supine   Cervical Isometrics 15 reps;5 secs  head presses, then with tongue press to top of mouth   Shoulder Flexion Both;10 reps  red band   Shoulder ABduction 10 reps;Both  horizontal abduction, red band   Upper Extremity D1 Flexion;10 reps;Theraband  bilat   Theraband Level (UE D1) Level 2 (Red)   Other Supine Exercise bilat shoulder ER red band 10 reps   Modalities   Modalities --Pt declined, didn't feel like she needed these today, Will use home TENs PRN   Moist Heat Therapy   Number Minutes Moist Heat --   Moist Heat Location --   Electrical Stimulation   Electrical Stimulation Location --   Electrical Stimulation Action --   Electrical Stimulation Parameters --   Electrical Stimulation Goals --   Manual Therapy   Manual Therapy Manual Traction;Soft tissue mobilization   Soft tissue mobilization cervical paraspinals, upper traps, pecs bilat   Manual Traction gentle cervical traction through much of the manual work to  maintain cervical position in less cervical extension                 PT Education - 01/28/16 1502    Education provided Yes   Education Details HEP   Person(s) Educated Patient   Methods Explanation;Demonstration;Handout   Comprehension Returned demonstration             PT Long Term Goals - 01/28/16 1447    PT LONG TERM GOAL #1   Title Improve posture and alignment with patient to demo more upright posture of head on neck and shoulders 02/01/16   Status Achieved   PT LONG TERM GOAL #2   Title Improve cervical ROM by 5-10 degrees 02/01/16   Status Partially Met   PT LONG TERM GOAL #3   Title patient to tolerate increased activity level for household chores - cooking and cleaning with increased endurance per pt report 02/01/16   Status Achieved   PT LONG TERM GOAL #4   Title I  in HEP 02/01/16   Status Achieved   PT LONG TERM GOAL #5   Title Further assess and address BPPV as tolerated 3/131/7   Status Achieved               Plan - 01/28/16 1532    Clinical Impression Statement Pt will decreased cervical and upper shoulder muscular tightness as compared to last week.  She is pleased with her current level of function and ready for D/C to HEP         Problem List Patient Active Problem List   Diagnosis Date Noted  . Elevated alkaline phosphatase level 11/24/2015  . Primary osteoarthritis of both first carpometacarpal joints 09/22/2015  . Right shoulder pain 02/23/2015  . Chronic pain syndrome 02/06/2015  . Bilateral carpal tunnel syndrome 01/19/2015  . Dyssomnia 06/15/2014  . Left shoulder pain 06/15/2014  . Dry mouth 06/15/2014  . DDD (degenerative disc disease), lumbar 06/15/2014  . Insomnia 04/30/2014  . Actinic porokeratosis 02/27/2014  . ANA positive 01/24/2014  . History of arthroscopy of left shoulder 01/17/2014  . Strain of gastrocnemius tendon of right lower extremity 12/16/2013  . Osteoarthritis of both knees 11/11/2013  . Lumbar degenerative disc disease 11/11/2013  . Osteoarthritis, hip, bilateral 10/14/2013  . Lupus (systemic lupus erythematosus) ([place]) 05/29/2013  . Metatarsalgia of left foot 12/24/2012  . Chronic venous insufficiency 12/14/2012  . Hyperlipemia 06/02/2011  . Atrophic vaginitis 06/01/2011  . Essential hypertension, benign 05/09/2011  . GERD (gastroesophageal reflux disease) 05/09/2011  . Varicose veins 05/09/2011  . Urinary incontinence 03/25/2011    Jeral Pinch PT  01/28/2016, 4:47 PM  [facility] [place], [place], [address] Phone: [phone]   Fax:  [phone]  Name: Amahia Madonia MRN: [id number] Date of Birth: 09/11/1946    PHYSICAL THERAPY DISCHARGE SUMMARY  Visits from Start of Care: 11 Current functional level related  to goals / functional outcomes: See above   Remaining deficits: Pt is at baseline level of function   Education / Equipment: HEP , red theraband Plan: Patient agrees to discharge.  Patient goals were partially met. Patient is being discharged due to being pleased with the current functional level.  ?????   Jeral Pinch, PT 01/28/2016 4:49 PM

## 2016-01-28 NOTE — Patient Instructions (Signed)
Over Head Pull: Narrow Grip  Perform 3 times a week.    [place] [id number]   On back, knees bent, feet flat, band across thighs, elbows straight but relaxed. Pull hands apart (start). Keeping elbows straight, bring arms up and over head, hands toward floor. Keep pull steady on band. Hold momentarily. Return slowly, keeping pull steady, back to start. Repeat _1-2x10__ times. Band color _red_____   Side Pull: Double Arm   On back, knees bent, feet flat. Arms perpendicular to body, shoulder level, elbows straight but relaxed. Pull arms out to sides, elbows straight. Resistance band comes across collarbones, hands toward floor. Hold momentarily. Slowly return to starting position. Repeat _1-2x10__ times. Band color _red____   Elmer Picker   On back, knees bent, feet flat, left hand on left hip, right hand above left. Pull right arm DIAGONALLY (hip to shoulder) across chest. Bring right arm along head toward floor. Hold momentarily. Slowly return to starting position. Repeat 1-2x10___ times. Do with left arm. Band color __red____   Shoulder Rotation: Double Arm   On back, knees bent, feet flat, elbows tucked at sides, bent 90, hands palms up. Pull hands apart and down toward floor, keeping elbows near sides. Hold momentarily. Slowly return to starting position. Repeat _1-2x10__ times. Band color __red____

## 2016-02-01 DIAGNOSIS — M179 Osteoarthritis of knee, unspecified: Secondary | ICD-10-CM | POA: Diagnosis not present

## 2016-02-01 DIAGNOSIS — G479 Sleep disorder, unspecified: Secondary | ICD-10-CM | POA: Diagnosis not present

## 2016-02-01 DIAGNOSIS — M25519 Pain in unspecified shoulder: Secondary | ICD-10-CM | POA: Diagnosis not present

## 2016-02-01 DIAGNOSIS — M5136 Other intervertebral disc degeneration, lumbar region: Secondary | ICD-10-CM | POA: Diagnosis not present

## 2016-02-01 DIAGNOSIS — R768 Other specified abnormal immunological findings in serum: Secondary | ICD-10-CM | POA: Diagnosis not present

## 2016-02-01 DIAGNOSIS — R682 Dry mouth, unspecified: Secondary | ICD-10-CM | POA: Diagnosis not present

## 2016-02-02 ENCOUNTER — Other Ambulatory Visit: Payer: Self-pay | Admitting: Family Medicine

## 2016-02-08 DIAGNOSIS — Z538 Procedure and treatment not carried out for other reasons: Secondary | ICD-10-CM | POA: Diagnosis not present

## 2016-02-08 DIAGNOSIS — R197 Diarrhea, unspecified: Secondary | ICD-10-CM | POA: Diagnosis not present

## 2016-02-08 LAB — HM COLONOSCOPY

## 2016-02-10 ENCOUNTER — Encounter: Payer: Self-pay | Admitting: Family Medicine

## 2016-03-05 IMAGING — MR MR KNEE*L* W/O CM
6 series · 40 of 40 positions shown · non-contrast
Comparison: None.

CLINICAL DATA: Medial and lateral joint pain. Symptoms for 18
months.

EXAM:
MRI OF THE LEFT KNEE WITHOUT CONTRAST
TECHNIQUE: Multiplanar, multisequence MR imaging of the knee was performed. No
intravenous contrast was administered.

[Series 3: PD fat-sat · axial · 3.0mm · 0.31mm/px · z∈[-63,+66]mm · 7 of 40 slices shown (1 of 3)]
[im 1/40]
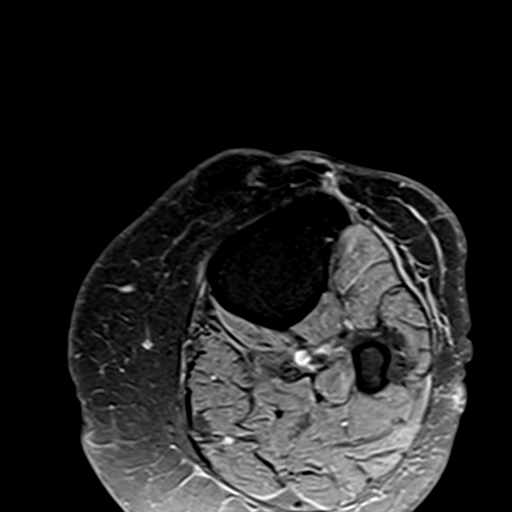
[im 7/40]
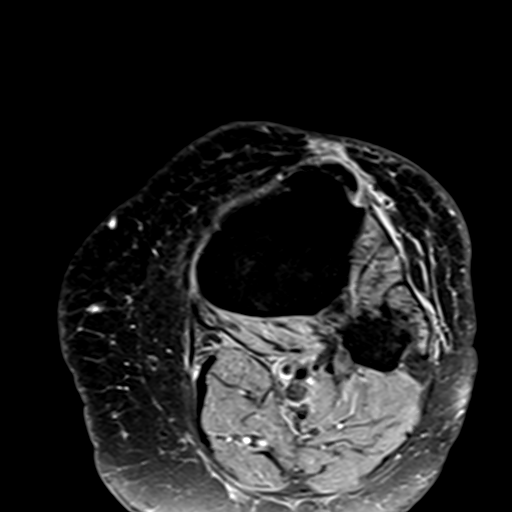
[im 14/40]
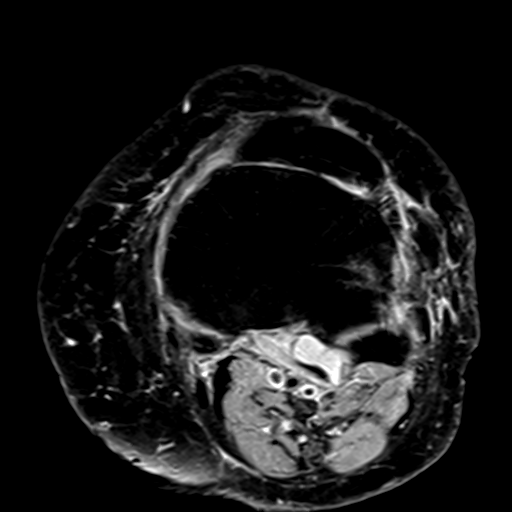
[im 20/40]
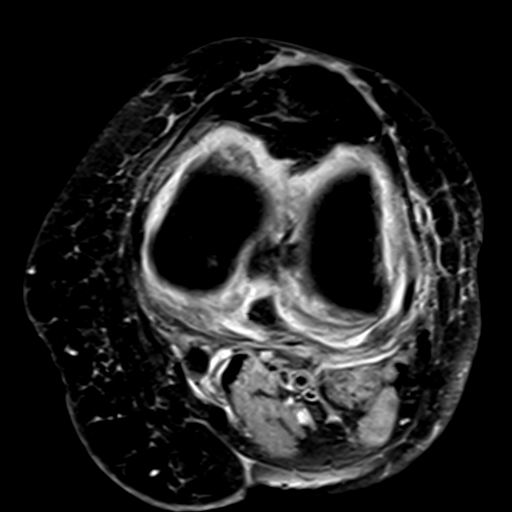
[im 27/40]
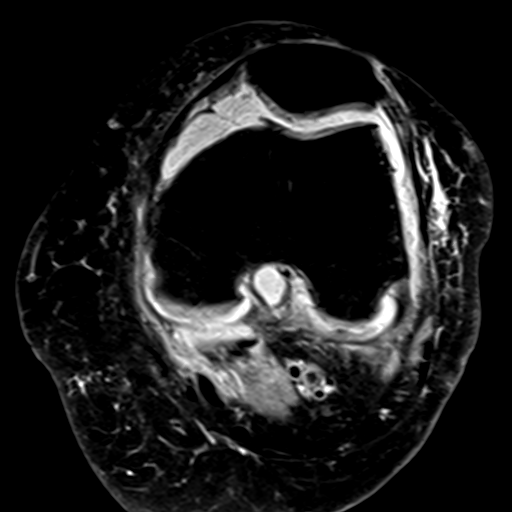
[im 33/40]
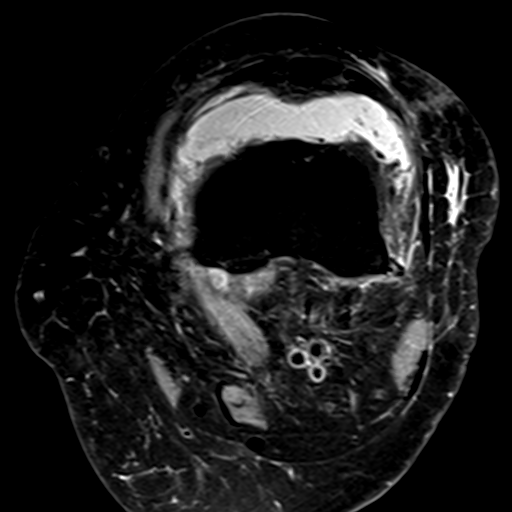
[im 40/40]
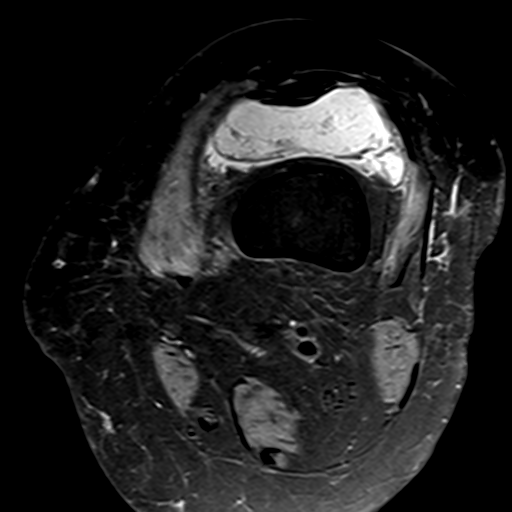

[Series 4: T1 · coronal · 3.0mm · 0.50mm/px · 8 of 39 slices shown]
[im 1/39]
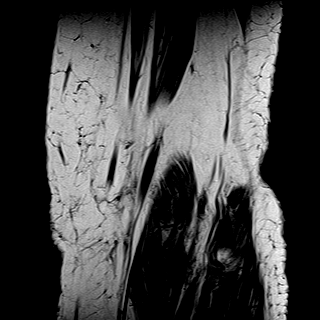
[im 6/39]
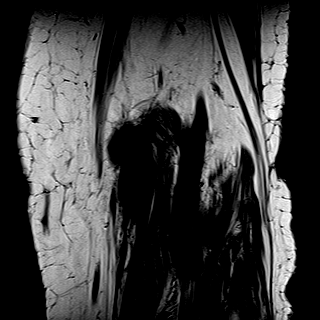
[im 11/39]
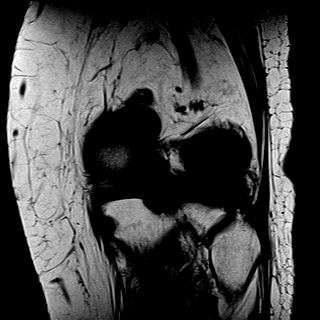
[im 17/39]
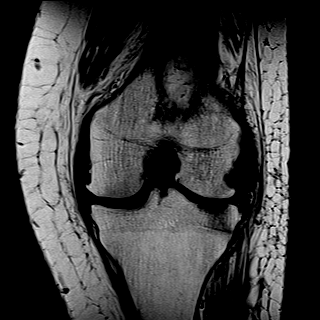
[im 22/39]
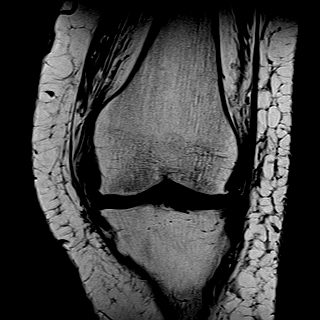
[im 28/39]
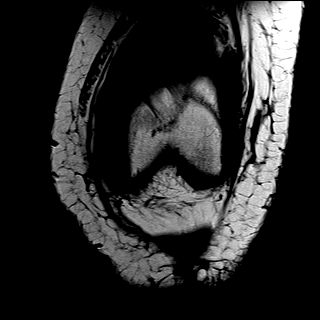
[im 33/39]
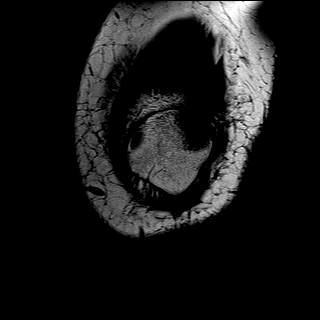
[im 39/39]
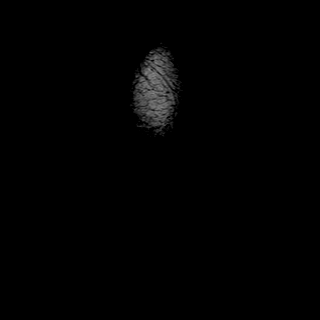

[Series 5: PD fat-sat · sagittal · 3.0mm · 0.62mm/px · 7 of 37 slices shown (2 of 3)]
[im 1/37]
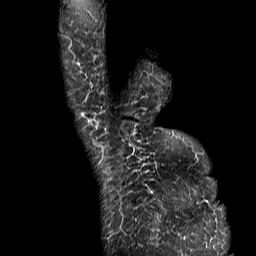
[im 7/37]
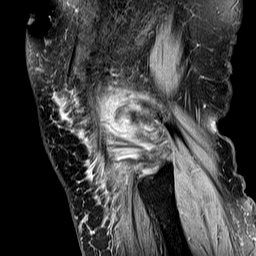
[im 13/37]
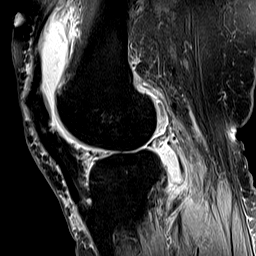
[im 19/37]
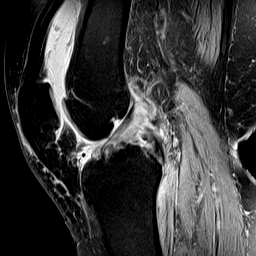
[im 25/37]
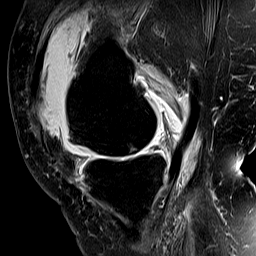
[im 31/37]
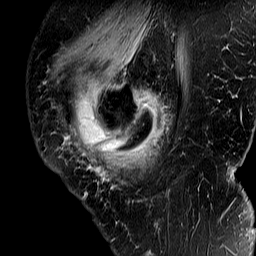
[im 37/37]
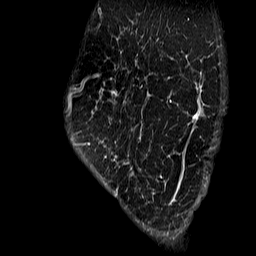

[Series 6: T2 fat-sat · coronal · 3.0mm · 0.62mm/px · 8 of 39 slices shown]
[im 1/39]
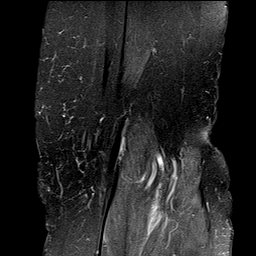
[im 6/39]
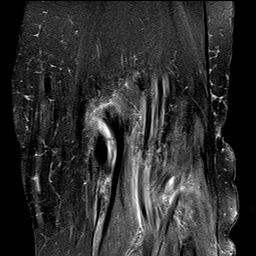
[im 11/39]
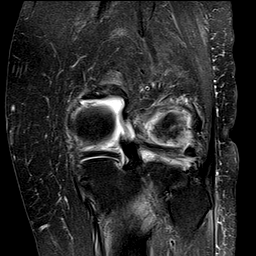
[im 17/39]
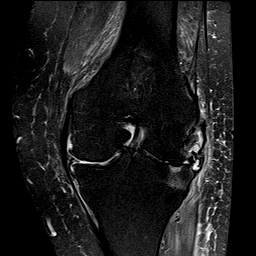
[im 22/39]
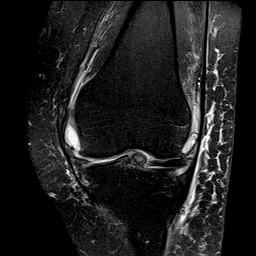
[im 28/39]
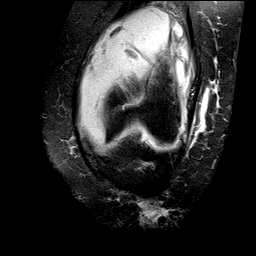
[im 33/39]
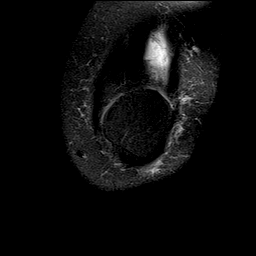
[im 39/39]
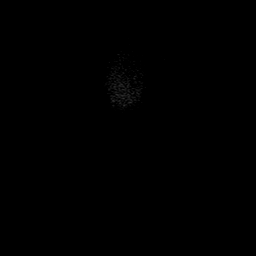

[Series 8: PD fat-sat · oblique · 2.0mm · 0.62mm/px · 2 of 9 slices shown (3 of 3)]
[im 1/9]
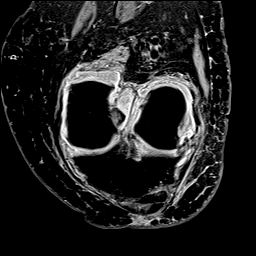
[im 9/9]
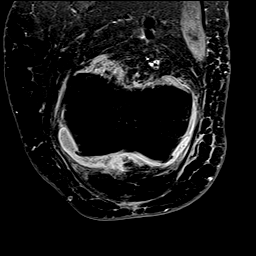

[Series 100: PD · coronal · 3.0mm · 0.62mm/px · 8 of 39 slices shown]
[im 1/39]
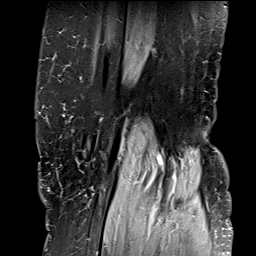
[im 6/39]
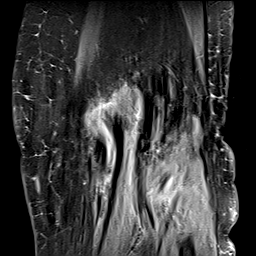
[im 11/39]
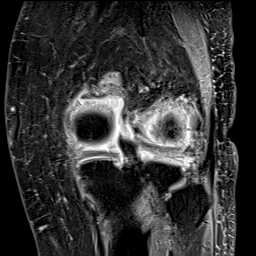
[im 17/39]
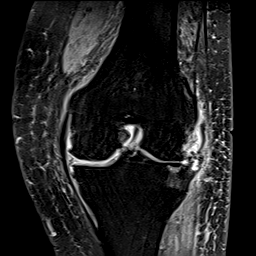
[im 22/39]
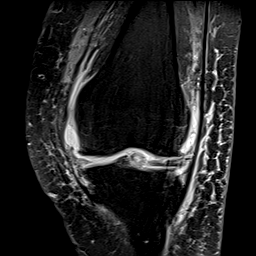
[im 28/39]
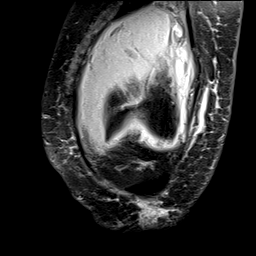
[im 33/39]
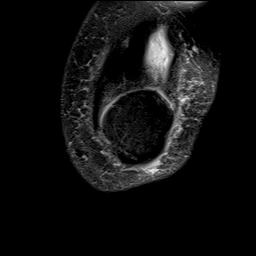
[im 39/39]
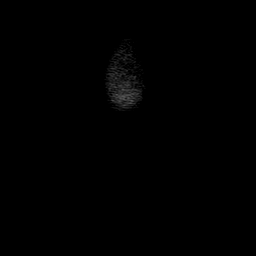

[40 of 40 positions shown; findings below may reference images not displayed]

FINDINGS: MENISCI

Medial meniscus: Maceration of the posterior horn of the medial
meniscus adjacent to the meniscal root. Small oblique tear of the
body of the medial meniscus extending to the inferior articular
surface.

Lateral meniscus: Small vertical tear of the body of the lateral
meniscus. Vertical tear of the posterior horn of the posterior horn
of the lateral meniscus extending to the inferior articular surface.

LIGAMENTS

Cruciates:  Intact ACL and PCL.

Collaterals: Medial collateral ligament is intact. Lateral
collateral ligament complex is intact.

CARTILAGE

Patellofemoral: Partial thickness cartilage loss of the medial and
lateral patellofemoral compartments.

Medial: High-grade partial-thickness cartilage loss with areas of
full-thickness cartilage loss involving the medial femoral condyle.
High-grade partial-thickness cartilage loss of the medial tibial
plateau.

Lateral: High-grade partial-thickness cartilage loss with areas of
full-thickness cartilage loss involving the lateral femoral condyle
and lateral tibial plateau.

Joint: Large joint effusion. Normal Hoffa's fat. No plical
thickening.

Popliteal Fossa:  Small Baker cyst.  Intact popliteus tendon.

Extensor Mechanism:  Intact.

Bones: Subchondral low linear low signal in the lateral tibial
plateau with adjacent marrow edema most concerning for a subchondral
insufficiency fracture.
IMPRESSION: 1. Maceration of the posterior horn of the medial meniscus adjacent
to the meniscal root. Small oblique tear of the body of the medial
meniscus extending to the inferior articular surface.
2. Small vertical tear of the body of the lateral meniscus. Vertical
tear of the posterior horn of the posterior horn of the lateral
meniscus extending to the inferior articular surface.
3. Tricompartmental cartilage abnormalities as described above.
4. Large joint effusion.
5. Small Baker's cyst.
6. Subchondral low linear low signal in the lateral tibial plateau
with adjacent marrow edema most concerning for a subchondral
insufficiency fracture.

## 2016-03-07 ENCOUNTER — Other Ambulatory Visit: Payer: Self-pay | Admitting: Sports Medicine

## 2016-03-07 DIAGNOSIS — I10 Essential (primary) hypertension: Secondary | ICD-10-CM | POA: Diagnosis not present

## 2016-03-07 DIAGNOSIS — E785 Hyperlipidemia, unspecified: Secondary | ICD-10-CM | POA: Diagnosis not present

## 2016-03-08 LAB — COMPLETE METABOLIC PANEL WITH GFR
ALT: 29 U/L (ref 6–29)
AST: 43 U/L — ABNORMAL HIGH (ref 10–35)
Albumin: 4.1 g/dL (ref 3.6–5.1)
Alkaline Phosphatase: 172 U/L — ABNORMAL HIGH (ref 33–130)
BILIRUBIN TOTAL: 0.5 mg/dL (ref 0.2–1.2)
BUN: 15 mg/dL (ref 7–25)
CALCIUM: 10.1 mg/dL (ref 8.6–10.4)
CO2: 31 mmol/L (ref 20–31)
CREATININE: 0.77 mg/dL (ref 0.50–0.99)
Chloride: 101 mmol/L (ref 98–110)
GFR, EST NON AFRICAN AMERICAN: 79 mL/min (ref >=60)
Glucose, Bld: 97 mg/dL (ref 65–99)
Potassium: 4.1 mmol/L (ref 3.5–5.3)
Sodium: 140 mmol/L (ref 135–146)
TOTAL PROTEIN: 7.7 g/dL (ref 6.1–8.1)

## 2016-03-08 LAB — HEPATITIS C ANTIBODY: HCV Ab: NEGATIVE

## 2016-03-08 LAB — LIPID PANEL
CHOLESTEROL: 168 mg/dL (ref 125–200)
HDL: 88 mg/dL (ref >=46)
LDL CALC: 69 mg/dL (ref <130)
TRIGLYCERIDES: 54 mg/dL (ref <150)
Total CHOL/HDL Ratio: 1.9 Ratio (ref <=5.0)
VLDL: 11 mg/dL (ref <30)

## 2016-03-09 ENCOUNTER — Encounter: Payer: Self-pay | Admitting: Sports Medicine

## 2016-03-09 ENCOUNTER — Ambulatory Visit (INDEPENDENT_AMBULATORY_CARE_PROVIDER_SITE_OTHER): Payer: Medicare Other | Admitting: Sports Medicine

## 2016-03-09 VITALS — BP 133/77 | HR 79 | Resp 18 | Wt 178.9 lb

## 2016-03-09 DIAGNOSIS — M160 Bilateral primary osteoarthritis of hip: Secondary | ICD-10-CM | POA: Diagnosis not present

## 2016-03-09 NOTE — Addendum Note (Signed)
Addended by: Elizabeth Sauer on: 03/09/2016 09:33 AM   Modules accepted: Orders, Medications

## 2016-03-09 NOTE — Assessment & Plan Note (Signed)
Three-month response to previous hip joint injection, right hip joint has an effusion today, injected under ultrasound guidance, return as needed.

## 2016-03-09 NOTE — Progress Notes (Signed)
  Subjective:    CC: Right hip pain  HPI: This is a pleasant 70 year old female, she has known right hip osteoarthritis, previous injection was approximately 3 months ago, now having recurrence of pain in the groin with radiation to the knee, moderate, persistent. Desires interventional treatment today.  Past medical history, Surgical history, Family history not pertinant except as noted below, Social history, Allergies, and medications have been entered into the medical record, reviewed, and no changes needed.   Review of Systems: No fevers, chills, night sweats, weight loss, chest pain, or shortness of breath.   Objective:    General: Well Developed, well nourished, and in no acute distress.  Neuro: Alert and oriented x3, extra-ocular muscles intact, sensation grossly intact.  HEENT: Normocephalic, atraumatic, pupils equal round reactive to light, neck supple, no masses, no lymphadenopathy, thyroid nonpalpable.  Skin: Warm and dry, no rashes. Cardiac: Regular rate and rhythm, no murmurs rubs or gallops, no lower extremity edema.  Respiratory: Clear to auscultation bilaterally. Not using accessory muscles, speaking in full sentences.  Procedure: Real-time Ultrasound Guided Injection of right hip joint Device: GE Logiq E  Verbal informed consent obtained.  Time-out conducted.  Noted no overlying erythema, induration, or other signs of local infection.  Skin prepped in a sterile fashion.  Local anesthesia: Topical Ethyl chloride.  With sterile technique and under real time ultrasound guidance:  Noted hip effusion, spinal needle advanced to the femoral head/neck junction, 2 mL kenalog 40, 2 mL lidocaine, 2 mL Marcaine injected easily. Completed without difficulty  Pain immediately resolved suggesting accurate placement of the medication.  Advised to call if fevers/chills, erythema, induration, drainage, or persistent bleeding.  Images permanently stored and available for review in the  ultrasound unit.  Impression: Technically successful ultrasound guided injection.  Impression and Recommendations:

## 2016-03-10 ENCOUNTER — Encounter: Payer: Self-pay | Admitting: Family Medicine

## 2016-03-10 ENCOUNTER — Ambulatory Visit (INDEPENDENT_AMBULATORY_CARE_PROVIDER_SITE_OTHER): Payer: Medicare Other | Admitting: Family Medicine

## 2016-03-10 VITALS — BP 124/88 | HR 70 | Wt 179.0 lb

## 2016-03-10 DIAGNOSIS — M329 Systemic lupus erythematosus, unspecified: Secondary | ICD-10-CM | POA: Diagnosis not present

## 2016-03-10 DIAGNOSIS — R748 Abnormal levels of other serum enzymes: Secondary | ICD-10-CM | POA: Diagnosis not present

## 2016-03-10 DIAGNOSIS — K5900 Constipation, unspecified: Secondary | ICD-10-CM

## 2016-03-10 DIAGNOSIS — H8113 Benign paroxysmal vertigo, bilateral: Secondary | ICD-10-CM

## 2016-03-10 DIAGNOSIS — I10 Essential (primary) hypertension: Secondary | ICD-10-CM | POA: Diagnosis not present

## 2016-03-10 DIAGNOSIS — K5909 Other constipation: Secondary | ICD-10-CM

## 2016-03-10 MED ORDER — AMBULATORY NON FORMULARY MEDICATION: Status: DC

## 2016-03-10 NOTE — Patient Instructions (Signed)

## 2016-03-10 NOTE — Progress Notes (Signed)
   Subjective:    Patient ID: Katherine Nixon, female    DOB: [birth year], 70 y.o.   MRN: [id number]  HPI Three-month follow-up for vertigo-she went to physical therapy and they worked on her neck and she has had complete resolution of her vertigo symptoms since then.  Hypertension- Pt denies chest pain, SOB, dizziness, or heart palpitations.  Taking meds as directed w/o problems.  Denies medication side effects.    Patient recently had blood work earlier this week. Alkaline phosphatase was mildly elevated at 172 and AST was also mildly elevated at 43. And asked her to avoid any alcohol or Tylenol products and recheck liver function in one month.It was elevated previously as well. She did have abdominal ultrasound in January which was essentially normal.  Constipation-it turns out that when she had her colonoscopy she actually had significant constipation. She's been experiencing side diarrhea and didn't realize that she had stool leaking around hard stools. Since then she's been taking MiraLAX daily but she feels like it's not been very effective. She actually hasn't had a bowel movement in the last 3 days and is starting to feel a little bit more bloated. She does occasionally use Dulcolax. And she's been eating prunes at bedtime. She wants to know if she could take a stool softener.   Review of Systems     Objective:   Physical Exam  Constitutional: She is oriented to person, place, and time. She appears well-developed and well-nourished.  HENT:  Head: Normocephalic and atraumatic.  Cardiovascular: Normal rate, regular rhythm and normal heart sounds.   Pulmonary/Chest: Effort normal and breath sounds normal.  Abdominal: Soft. Bowel sounds are normal.  Mildly tender in the left side of the abdomen just lateral to the umbilicus. No rebound or guarding.  Neurological: She is alert and oriented to person, place, and time.  Skin: Skin is warm and dry.  Psychiatric: She has a normal mood and  affect. Her behavior is normal.        Assessment & Plan:  Vertigo - Resolved. Continue home stretches given by physical therapy to keep her neck mobile.   Hypertension-well-controlled. Continue current regimen. Follow-up in 6 months.  Elevated alkaline phosphatase and elevated AST. Plan to recheck in one month. Ultrasound was normal of the liver. Let's recheck vitamin D at the blood draw.  Constipation-continue with daily MiraLAX and okay to use Dulcolax as needed if she has not had a bowel movement in 3 days. Also can take a stool softener with each meal as that certainly would help as well and make sure she is hydrating well.  Due for Tdap. Script given since covered through Medicare part D  Lupus - Rheumatologist at [facility] is [facility]. Patient says that she was concerned that her vaccinations may not be completely up-to-date. I will fax a letter to her.

## 2016-03-29 DIAGNOSIS — L730 Acne keloid: Secondary | ICD-10-CM | POA: Diagnosis not present

## 2016-04-05 DIAGNOSIS — D485 Neoplasm of uncertain behavior of skin: Secondary | ICD-10-CM | POA: Diagnosis not present

## 2016-04-05 DIAGNOSIS — D2339 Other benign neoplasm of skin of other parts of face: Secondary | ICD-10-CM | POA: Diagnosis not present

## 2016-04-05 DIAGNOSIS — R238 Other skin changes: Secondary | ICD-10-CM | POA: Diagnosis not present

## 2016-04-07 DIAGNOSIS — L988 Other specified disorders of the skin and subcutaneous tissue: Secondary | ICD-10-CM | POA: Diagnosis not present

## 2016-05-02 ENCOUNTER — Other Ambulatory Visit: Payer: Self-pay | Admitting: Family Medicine

## 2016-06-02 DIAGNOSIS — M5136 Other intervertebral disc degeneration, lumbar region: Secondary | ICD-10-CM | POA: Diagnosis not present

## 2016-06-02 DIAGNOSIS — M25519 Pain in unspecified shoulder: Secondary | ICD-10-CM | POA: Diagnosis not present

## 2016-06-02 DIAGNOSIS — M1710 Unilateral primary osteoarthritis, unspecified knee: Secondary | ICD-10-CM | POA: Diagnosis not present

## 2016-06-02 DIAGNOSIS — R768 Other specified abnormal immunological findings in serum: Secondary | ICD-10-CM | POA: Diagnosis not present

## 2016-06-07 ENCOUNTER — Encounter: Payer: Self-pay | Admitting: Sports Medicine

## 2016-06-07 ENCOUNTER — Ambulatory Visit (INDEPENDENT_AMBULATORY_CARE_PROVIDER_SITE_OTHER): Payer: Medicare Other | Admitting: Sports Medicine

## 2016-06-07 VITALS — BP 125/69 | HR 76 | Resp 18 | Wt 184.7 lb

## 2016-06-07 DIAGNOSIS — M160 Bilateral primary osteoarthritis of hip: Secondary | ICD-10-CM

## 2016-06-07 MED ORDER — DULOXETINE HCL 60 MG PO CPEP: 60.0000 mg | ORAL_CAPSULE | Freq: Every day | ORAL | Status: DC

## 2016-06-07 NOTE — Assessment & Plan Note (Addendum)
Just short of a three-month response to previous right hip joint injection, repeat right hip joint injection today.  Referral to Dr. Mayer Camel for consideration of anterior hip arthroplasty Return as needed.

## 2016-06-07 NOTE — Progress Notes (Signed)
  Subjective:    CC: Right hip pain  HPI: This is a pleasant 70 year old female with known right hip osteoarthritis, previous injection was 3 months ago, she desires repeat, pain is moderate, persistent and localized at the right groin. Worse with weightbearing and internal rotation.  Past medical history, Surgical history, Family history not pertinant except as noted below, Social history, Allergies, and medications have been entered into the medical record, reviewed, and no changes needed.   Review of Systems: No fevers, chills, night sweats, weight loss, chest pain, or shortness of breath.   Objective:    General: Well Developed, well nourished, and in no acute distress.  Neuro: Alert and oriented x3, extra-ocular muscles intact, sensation grossly intact.  HEENT: Normocephalic, atraumatic, pupils equal round reactive to light, neck supple, no masses, no lymphadenopathy, thyroid nonpalpable.  Skin: Warm and dry, no rashes. Cardiac: Regular rate and rhythm, no murmurs rubs or gallops, no lower extremity edema.  Respiratory: Clear to auscultation bilaterally. Not using accessory muscles, speaking in full sentences.  Procedure: Real-time Ultrasound Guided Injection of right hip joint Device: GE Logiq E  Verbal informed consent obtained.  Time-out conducted.  Noted no overlying erythema, induration, or other signs of local infection.  Skin prepped in a sterile fashion.  Local anesthesia: Topical Ethyl chloride.  With sterile technique and under real time ultrasound guidance:  Spinal needle advanced to the femoral head/neck junction, 1 mL kenalog 40, 2 mL lidocaine, 2 mL Marcaine injected easily. Completed without difficulty  Pain immediately resolved suggesting accurate placement of the medication.  Advised to call if fevers/chills, erythema, induration, drainage, or persistent bleeding.  Images permanently stored and available for review in the ultrasound unit.  Impression: Technically  successful ultrasound guided injection. Impression and Recommendations:

## 2016-06-23 DIAGNOSIS — M1611 Unilateral primary osteoarthritis, right hip: Secondary | ICD-10-CM | POA: Diagnosis not present

## 2016-07-11 ENCOUNTER — Ambulatory Visit (INDEPENDENT_AMBULATORY_CARE_PROVIDER_SITE_OTHER): Payer: Medicare Other | Admitting: Family Medicine

## 2016-07-11 ENCOUNTER — Encounter: Payer: Self-pay | Admitting: Family Medicine

## 2016-07-11 VITALS — BP 139/53 | HR 72 | Resp 19 | Wt 182.0 lb

## 2016-07-11 DIAGNOSIS — R748 Abnormal levels of other serum enzymes: Secondary | ICD-10-CM | POA: Diagnosis not present

## 2016-07-11 DIAGNOSIS — R0989 Other specified symptoms and signs involving the circulatory and respiratory systems: Secondary | ICD-10-CM | POA: Diagnosis not present

## 2016-07-11 DIAGNOSIS — M1611 Unilateral primary osteoarthritis, right hip: Secondary | ICD-10-CM

## 2016-07-11 DIAGNOSIS — R7989 Other specified abnormal findings of blood chemistry: Secondary | ICD-10-CM | POA: Diagnosis not present

## 2016-07-11 DIAGNOSIS — I10 Essential (primary) hypertension: Secondary | ICD-10-CM | POA: Diagnosis not present

## 2016-07-11 MED ORDER — DULOXETINE HCL 60 MG PO CPEP: 60.0000 mg | ORAL_CAPSULE | Freq: Every day | ORAL | 3 refills | Status: DC

## 2016-07-11 NOTE — Progress Notes (Signed)
Subjective:    CC: HTN  HPI: Hypertension- Pt denies chest pain, SOB, dizziness, or heart palpitations.  Taking meds as directed w/o problems.  Denies medication side effects.    Follow-up elevated liver enzymes.   She did see the orthopedist in [place]. They are scheduling her for right hip replacement on October 11. She says it's now starting to impact her back and her knees because she's walking differently because still uncomfortable.  She's also had a panel left side of her throat. She's describes it as a sharp typical. She says been going on couple of months. 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 She says when she tries to go up to a higher note her voice will suddenly break and will drop down. She denies any dysphagia.   Past medical history, Surgical history, Family history not pertinant except as noted below, Social history, Allergies, and medications have been entered into the medical record, reviewed, and corrections made.   Review of Systems: No fevers, chills, night sweats, weight loss, chest pain, or shortness of breath.   Objective:    General: Well Developed, well nourished, and in no acute distress.  Neuro: Alert and oriented x3, extra-ocular muscles intact, sensation grossly intact.  HEENT: Normocephalic, atraumaticCortex is clear, no cervical lymphadenopathy. TMs and canals are clear bilaterally as well.  Skin: Warm and dry, no rashes. Cardiac: Regular rate and rhythm, no murmurs rubs or gallops, no lower extremity edema.  Respiratory: Clear to auscultation bilaterally. Not using accessory muscles, speaking in full sentences.   Impression and Recommendations:   HTN - Well controlled. Continue current regimen. Follow up in  6 mo.  Elevated alkaline phosphatase level-due to recheck labs.  Left throat sensation - recommend refer to ENT for further evaluation. It could be a polyp or growth or reflux.  Right hip OA - schedule for hip  replacement in Oct 11th.

## 2016-07-12 LAB — VITAMIN D 25 HYDROXY (VIT D DEFICIENCY, FRACTURES): Vit D, 25-Hydroxy: 31 ng/mL (ref 30–100)

## 2016-07-12 LAB — HEPATIC FUNCTION PANEL
ALK PHOS: 131 U/L — AB (ref 33–130)
ALT: 15 U/L (ref 6–29)
AST: 25 U/L (ref 10–35)
Albumin: 3.9 g/dL (ref 3.6–5.1)
BILIRUBIN DIRECT: 0.1 mg/dL (ref <=0.2)
BILIRUBIN INDIRECT: 0.4 mg/dL (ref 0.2–1.2)
BILIRUBIN TOTAL: 0.5 mg/dL (ref 0.2–1.2)
Total Protein: 7.3 g/dL (ref 6.1–8.1)

## 2016-07-12 LAB — GAMMA GT: GGT: 11 U/L (ref 7–51)

## 2016-07-18 ENCOUNTER — Other Ambulatory Visit: Payer: Self-pay

## 2016-07-24 ENCOUNTER — Other Ambulatory Visit: Payer: Self-pay | Admitting: Family Medicine

## 2016-07-27 ENCOUNTER — Other Ambulatory Visit: Payer: Self-pay | Admitting: Orthopedic Surgery

## 2016-08-01 ENCOUNTER — Other Ambulatory Visit: Payer: Self-pay | Admitting: Family Medicine

## 2016-08-01 DIAGNOSIS — Z1231 Encounter for screening mammogram for malignant neoplasm of breast: Secondary | ICD-10-CM

## 2016-08-18 DIAGNOSIS — Z23 Encounter for immunization: Secondary | ICD-10-CM | POA: Diagnosis not present

## 2016-08-18 DIAGNOSIS — K219 Gastro-esophageal reflux disease without esophagitis: Secondary | ICD-10-CM | POA: Diagnosis not present

## 2016-08-22 ENCOUNTER — Ambulatory Visit (HOSPITAL_COMMUNITY)
Admission: RE | Admit: 2016-08-22 | Discharge: 2016-08-22 | Disposition: A | Discharge status: Home / Self Care | Payer: Medicare Other | Source: Ambulatory Visit | Attending: Orthopedic Surgery | Admitting: Orthopedic Surgery

## 2016-08-22 ENCOUNTER — Encounter (HOSPITAL_COMMUNITY)
Admission: RE | Admit: 2016-08-22 | Discharge: 2016-08-22 | Disposition: A | Discharge status: Home / Self Care | Payer: Medicare Other | Source: Ambulatory Visit | Attending: Orthopedic Surgery | Admitting: Orthopedic Surgery

## 2016-08-22 ENCOUNTER — Encounter (HOSPITAL_COMMUNITY): Payer: Self-pay

## 2016-08-22 DIAGNOSIS — Z01818 Encounter for other preprocedural examination: Secondary | ICD-10-CM | POA: Insufficient documentation

## 2016-08-22 HISTORY — DX: Noninfective gastroenteritis and colitis, unspecified: K52.9

## 2016-08-22 HISTORY — DX: Adverse effect of unspecified anesthetic, initial encounter: T41.45XA

## 2016-08-22 HISTORY — DX: Depression, unspecified: F32.A

## 2016-08-22 HISTORY — DX: Major depressive disorder, single episode, unspecified: F32.9

## 2016-08-22 HISTORY — DX: Myoneural disorder, unspecified: G70.9

## 2016-08-22 HISTORY — DX: Essential (primary) hypertension: I10

## 2016-08-22 HISTORY — DX: Anxiety disorder, unspecified: F41.9

## 2016-08-22 HISTORY — DX: Cardiac arrhythmia, unspecified: I49.9

## 2016-08-22 LAB — CBC WITH DIFFERENTIAL/PLATELET
Basophils Absolute: 0 10*3/uL (ref 0.0–0.1)
Basophils Relative: 1 %
EOS ABS: 0.1 10*3/uL (ref 0.0–0.7)
EOS PCT: 2 %
HCT: 40.6 % (ref 36.0–46.0)
HEMOGLOBIN: 12.7 g/dL (ref 12.0–15.0)
LYMPHS ABS: 1.6 10*3/uL (ref 0.7–4.0)
Lymphocytes Relative: 28 %
MCH: 30 pg (ref 26.0–34.0)
MCHC: 31.3 g/dL (ref 30.0–36.0)
MCV: 96 fL (ref 78.0–100.0)
MONO ABS: 0.5 10*3/uL (ref 0.1–1.0)
MONOS PCT: 9 %
NEUTROS PCT: 60 %
Neutro Abs: 3.5 10*3/uL (ref 1.7–7.7)
Platelets: 199 10*3/uL (ref 150–400)
RBC: 4.23 MIL/uL (ref 3.87–5.11)
RDW: 14.8 % (ref 11.5–15.5)
WBC: 5.8 10*3/uL (ref 4.0–10.5)

## 2016-08-22 LAB — BASIC METABOLIC PANEL
Anion gap: 6 (ref 5–15)
BUN: 14 mg/dL (ref 6–20)
CHLORIDE: 103 mmol/L (ref 101–111)
CO2: 30 mmol/L (ref 22–32)
CREATININE: 0.74 mg/dL (ref 0.44–1.00)
Calcium: 10.3 mg/dL (ref 8.9–10.3)
GFR calc Af Amer: >60 mL/min (ref >60)
GFR calc non Af Amer: >60 mL/min (ref >60)
GLUCOSE: 111 mg/dL — AB (ref 65–99)
Potassium: 4 mmol/L (ref 3.5–5.1)
Sodium: 139 mmol/L (ref 135–145)

## 2016-08-22 LAB — URINE MICROSCOPIC-ADD ON

## 2016-08-22 LAB — URINALYSIS, ROUTINE W REFLEX MICROSCOPIC
BILIRUBIN URINE: NEGATIVE
GLUCOSE, UA: NEGATIVE mg/dL
HGB URINE DIPSTICK: NEGATIVE
Ketones, ur: 15 mg/dL — AB
NITRITE: NEGATIVE
Protein, ur: NEGATIVE mg/dL
SPECIFIC GRAVITY, URINE: 1.02 (ref 1.005–1.030)
pH: 5.5 (ref 5.0–8.0)

## 2016-08-22 LAB — SURGICAL PCR SCREEN
MRSA, PCR: NEGATIVE
STAPHYLOCOCCUS AUREUS: NEGATIVE

## 2016-08-22 LAB — APTT: aPTT: 37 seconds — ABNORMAL HIGH (ref 24–36)

## 2016-08-22 LAB — PROTIME-INR
INR: 1.13
PROTHROMBIN TIME: 14.5 s (ref 11.4–15.2)

## 2016-08-22 LAB — TYPE AND SCREEN
ABO/RH(D): O POS
Antibody Screen: NEGATIVE

## 2016-08-22 LAB — ABO/RH: ABO/RH(D): O POS

## 2016-08-22 NOTE — Progress Notes (Signed)
PCP is Dr. Beatrice Lecher Denies ever seeing a cardiologist. Denies ever having a card cath or echo. States she had a stress test many years ago probably 40 years ago. States after her gallbladder surgery, she was hard to wake up, but she has had surgery since then without problems. Denies any recent chest pain.

## 2016-08-22 NOTE — Pre-Procedure Instructions (Signed)
Katherine Nixon  08/22/2016      [facility] [place], [address] [place] [address] Phone: [phone] Fax: [phone]  [facility] [place], [place] [address] [place] [address] Phone: [phone] Fax: [phone]    Your procedure is scheduled on Oct. 11  Report to [facility] at [phone].  Call this number if you have problems the morning of surgery:  [phone]   Remember:  Do not eat food or drink liquids after midnight.  Take these medicines the morning of surgery with A SIP OF WATER Duloxetine (Cymbalta), Oxycodone if needed  Stop taking aspirin, BC's, Goody's, Herbal medications, Fish Oil, Ibuprofen, Advil, Motrin, Aleve, Meloxicam (Mobic)   Do not wear jewelry, make-up or nail polish.  Do not wear lotions, powders, or perfumes, or deoderant.  Do not shave 48 hours prior to surgery.  Men may shave face and neck.  Do not bring valuables to the hospital.  [facility] is not responsible for any belongings or valuables.  Contacts, dentures or bridgework may not be worn into surgery.  Leave your suitcase in the car.  After surgery it may be brought to your room.  For patients admitted to the hospital, discharge time will be determined by your treatment team.  Patients discharged the day of surgery will not be allowed to drive home.   Special instructions:  [facility] - Preparing for Surgery  Before surgery, you can play an important role.  Because skin is not sterile, your skin needs to be as free of germs as possible.  You can reduce the number of germs on you skin by washing with CHG (chlorahexidine gluconate) soap before surgery.  CHG is an antiseptic cleaner which kills germs and bonds with the skin to continue killing germs even after washing.  Please DO NOT use if you have an allergy to CHG or  antibacterial soaps.  If your skin becomes reddened/irritated stop using the CHG and inform your nurse when you arrive at Short Stay.  Do not shave (including legs and underarms) for at least 48 hours prior to the first CHG shower.  You may shave your face.  Please follow these instructions carefully:   1.  Shower with CHG Soap the night before surgery and the    morning of Surgery.  2.  If you choose to wash your hair, wash your hair first as usual with your   normal shampoo.  3.  After you shampoo, rinse your hair and body thoroughly to remove the Shampoo.  4.  Use CHG as you would any other liquid soap.  You can apply chg directly to the skin and wash gently with scrungie or a clean washcloth.  5.  Apply the CHG Soap to your body ONLY FROM THE NECK DOWN.  Do not use on open wounds or open sores.  Avoid contact with your eyes,       ears, mouth and genitals (private parts).  Wash genitals (private parts) with your normal soap.  6.  Wash thoroughly, paying special attention to the area where your surgery  will be performed.  7.  Thoroughly rinse your body with warm water from the neck down.  8.  DO NOT shower/wash with your normal soap after using and rinsing off  the CHG Soap.  9.  Fraser Din  yourself dry with a clean towel.            10.  Wear clean pajamas.            11.  Place clean sheets on your bed the night of your first shower and do not  sleep with pets.  Day of Surgery  Do not apply any lotions/deoderants the morning of surgery.  Please wear clean clothes to the hospital/surgery center.     Please read over the following fact sheets that you were given. Coughing and Deep Breathing, MRSA Information and Surgical Site Infection Prevention

## 2016-08-22 NOTE — Progress Notes (Signed)
Dr. Damita Dunnings office called (spoke with Juliann Pulse) informed of UA results

## 2016-08-26 ENCOUNTER — Ambulatory Visit (INDEPENDENT_AMBULATORY_CARE_PROVIDER_SITE_OTHER): Payer: Medicare Other

## 2016-08-26 DIAGNOSIS — Z1231 Encounter for screening mammogram for malignant neoplasm of breast: Secondary | ICD-10-CM

## 2016-08-30 NOTE — H&P (Signed)
TOTAL HIP ADMISSION H&P  Patient is admitted for right total hip arthroplasty.  Subjective:  Chief Complaint: right hip pain  HPI: Katherine Nixon, 70 y.o. female, has a history of pain and functional disability in the right hip(s) due to arthritis and patient has failed non-surgical conservative treatments for greater than 12 weeks to include NSAID's and/or analgesics, use of assistive devices, weight reduction as appropriate and activity modification.  Onset of symptoms was gradual starting 3 years ago with gradually worsening course since that time.The patient noted no past surgery on the right hip(s).  Patient currently rates pain in the right hip at 10 out of 10 with activity. Patient has night pain, worsening of pain with activity and weight bearing, trendelenberg gait, pain that interfers with activities of daily living and pain with passive range of motion. Patient has evidence of joint space narrowing by imaging studies. This condition presents safety issues increasing the risk of falls.   There is no current active infection.  Patient Active Problem List   Diagnosis Date Noted  . Constipation, chronic 03/10/2016  . Elevated alkaline phosphatase level 11/24/2015  . Primary osteoarthritis of both first carpometacarpal joints 09/22/2015  . Right shoulder pain 02/23/2015  . Chronic pain syndrome 02/06/2015  . Bilateral carpal tunnel syndrome 01/19/2015  . Dyssomnia 06/15/2014  . Left shoulder pain 06/15/2014  . Dry mouth 06/15/2014  . DDD (degenerative disc disease), lumbar 06/15/2014  . Insomnia 04/30/2014  . Actinic porokeratosis 02/27/2014  . ANA positive 01/24/2014  . History of arthroscopy of left shoulder 01/17/2014  . Strain of gastrocnemius tendon of right lower extremity 12/16/2013  . Osteoarthritis of both knees 11/11/2013  . Lumbar degenerative disc disease 11/11/2013  . Osteoarthritis, hip, bilateral 10/14/2013  . Lupus (systemic lupus erythematosus) ([facility]) 05/29/2013   . Metatarsalgia of left foot 12/24/2012  . Chronic venous insufficiency 12/14/2012  . Hyperlipemia 06/02/2011  . Atrophic vaginitis 06/01/2011  . Essential hypertension, benign 05/09/2011  . GERD (gastroesophageal reflux disease) 05/09/2011  . Varicose veins 05/09/2011  . Urinary incontinence 03/25/2011   Past Medical History:  Diagnosis Date  . Anxiety   . Arthritis   . Chronic diarrhea   . Complication of anesthesia    diff to wake up with gallbaldder surgery- no problems since   . Depression   . Dysrhythmia     states at times  . Headache   . Hypertension   . Neuromuscular disorder (HCC)    lupus  . Squamous cell carcinoma     Past Surgical History:  Procedure Laterality Date  . BREAST SURGERY     biopsies/ aspirations - neg  . CATARACT EXTRACTION     with lens implanted  . CHOLECYSTECTOMY    . COLONOSCOPY    . DILATION AND CURETTAGE OF UTERUS    . KNEE ARTHROSCOPY Bilateral   . LAPAROSCOPIC CHOLECYSTECTOMY    . NASAL SINUS SURGERY    . SHOULDER SURGERY Left 12/2014   Dr. Tamera Punt   . VARICOSE VEIN SURGERY      No prescriptions prior to admission.   Allergies  Allergen Reactions  . Gabapentin Other (See Comments)    Hallucination   . Tramadol Other (See Comments)    Hallucinations   . Nsaids Other (See Comments)    Due to liver enzyme issue (meloxicam is ok per patient)  . Bactrim Rash  . Pravastatin Other (See Comments)    Muscle cramping  . Sulfamethoxazole-Trimethoprim Other (See Comments)    Thrush in mouth  Social History  Substance Use Topics  . Smoking status: Former Smoker    Types: Cigarettes    Quit date: 11/22/1987  . Smokeless tobacco: Never Used  . Alcohol use No    Family History  Problem Relation Age of Onset  . Heart disease Mother   . Heart attack Mother   . Hyperlipidemia Father   . Other Father     amputaiton  . Hyperlipidemia Brother   . Hypertension Brother      Review of Systems  Constitutional: Positive for  diaphoresis and malaise/fatigue.  HENT:       Sinus problems  Eyes: Negative.   Cardiovascular: Positive for leg swelling.  Gastrointestinal: Negative.   Genitourinary: Negative.   Musculoskeletal: Positive for joint pain and myalgias.  Neurological: Positive for dizziness and weakness.  Endo/Heme/Allergies: Negative.   Psychiatric/Behavioral: Negative.     Objective:  Physical Exam  Constitutional: She is oriented to person, place, and time. She appears well-developed and well-nourished.  HENT:  Head: Normocephalic and atraumatic.  Eyes: Pupils are equal, round, and reactive to light.  Neck: Normal range of motion. Neck supple.  Cardiovascular: Intact distal pulses.   Respiratory: Effort normal.  Musculoskeletal: She exhibits tenderness.  the patient has severe pain with internal rotation of the right hip.  Obvious pain with any motion of the hip.  Internal rotation to approximately 5-10 and same with external rotation.  She has tenderness with palpation of the groin.  Minimal lateral pain.  Her calves are soft and nontender.    Neurological: She is alert and oriented to person, place, and time.  Skin: Skin is warm and dry.  Psychiatric: She has a normal mood and affect. Her behavior is normal. Judgment and thought content normal.    Vital signs in last 24 hours:    Labs:   Estimated body mass index is 29.92 kg/m as calculated from the following:   Height as of 08/22/16: 5\' 5"  (1.651 m).   Weight as of 08/22/16: 81.6 kg (179 lb 12.8 oz).   Imaging Review Plain radiographs demonstrate AP of the pelvis and crosstable lateral of the right hip are taken and reviewed in office today.  Patient has end-stage bone-on-bone arthritis of the right hip.   Assessment/Plan:  End stage arthritis, right hip(s)  The patient history, physical examination, clinical judgement of the provider and imaging studies are consistent with end stage degenerative joint disease of the right hip(s)  and total hip arthroplasty is deemed medically necessary. The treatment options including medical management, injection therapy, arthroscopy and arthroplasty were discussed at length. The risks and benefits of total hip arthroplasty were presented and reviewed. The risks due to aseptic loosening, infection, stiffness, dislocation/subluxation,  thromboembolic complications and other imponderables were discussed.  The patient acknowledged the explanation, agreed to proceed with the plan and consent was signed. Patient is being admitted for inpatient treatment for surgery, pain control, PT, OT, prophylactic antibiotics, VTE prophylaxis, progressive ambulation and ADL's and discharge planning.The patient is planning to be discharged home with home health services

## 2016-08-31 ENCOUNTER — Inpatient Hospital Stay (HOSPITAL_COMMUNITY)
Admission: RE | Admit: 2016-08-31 | Discharge: 2016-09-02 | DRG: 470 | Disposition: A | Discharge status: Home Health | Payer: Medicare Other | Source: Ambulatory Visit | Attending: Orthopedic Surgery | Admitting: Orthopedic Surgery

## 2016-08-31 ENCOUNTER — Encounter (HOSPITAL_COMMUNITY): Payer: Self-pay | Admitting: Urology

## 2016-08-31 ENCOUNTER — Inpatient Hospital Stay (HOSPITAL_COMMUNITY): Payer: Medicare Other | Admitting: Anesthesiology

## 2016-08-31 ENCOUNTER — Encounter (HOSPITAL_COMMUNITY)
Admission: RE | Disposition: A | Discharge status: Home Health | Payer: Self-pay | Source: Ambulatory Visit | Attending: Orthopedic Surgery

## 2016-08-31 ENCOUNTER — Inpatient Hospital Stay (HOSPITAL_COMMUNITY): Payer: Medicare Other

## 2016-08-31 DIAGNOSIS — K219 Gastro-esophageal reflux disease without esophagitis: Secondary | ICD-10-CM | POA: Diagnosis present

## 2016-08-31 DIAGNOSIS — M5136 Other intervertebral disc degeneration, lumbar region: Secondary | ICD-10-CM | POA: Diagnosis present

## 2016-08-31 DIAGNOSIS — G4700 Insomnia, unspecified: Secondary | ICD-10-CM | POA: Diagnosis present

## 2016-08-31 DIAGNOSIS — G894 Chronic pain syndrome: Secondary | ICD-10-CM | POA: Diagnosis not present

## 2016-08-31 DIAGNOSIS — Z471 Aftercare following joint replacement surgery: Secondary | ICD-10-CM | POA: Diagnosis not present

## 2016-08-31 DIAGNOSIS — Z85828 Personal history of other malignant neoplasm of skin: Secondary | ICD-10-CM

## 2016-08-31 DIAGNOSIS — Z881 Allergy status to other antibiotic agents status: Secondary | ICD-10-CM

## 2016-08-31 DIAGNOSIS — M1611 Unilateral primary osteoarthritis, right hip: Secondary | ICD-10-CM | POA: Diagnosis present

## 2016-08-31 DIAGNOSIS — M7742 Metatarsalgia, left foot: Secondary | ICD-10-CM | POA: Diagnosis present

## 2016-08-31 DIAGNOSIS — Q828 Other specified congenital malformations of skin: Secondary | ICD-10-CM

## 2016-08-31 DIAGNOSIS — Z9849 Cataract extraction status, unspecified eye: Secondary | ICD-10-CM

## 2016-08-31 DIAGNOSIS — G5603 Carpal tunnel syndrome, bilateral upper limbs: Secondary | ICD-10-CM | POA: Diagnosis present

## 2016-08-31 DIAGNOSIS — Z885 Allergy status to narcotic agent status: Secondary | ICD-10-CM | POA: Diagnosis not present

## 2016-08-31 DIAGNOSIS — M169 Osteoarthritis of hip, unspecified: Secondary | ICD-10-CM | POA: Diagnosis not present

## 2016-08-31 DIAGNOSIS — M170 Bilateral primary osteoarthritis of knee: Secondary | ICD-10-CM | POA: Diagnosis present

## 2016-08-31 DIAGNOSIS — I10 Essential (primary) hypertension: Secondary | ICD-10-CM | POA: Diagnosis present

## 2016-08-31 DIAGNOSIS — N952 Postmenopausal atrophic vaginitis: Secondary | ICD-10-CM | POA: Diagnosis present

## 2016-08-31 DIAGNOSIS — R2689 Other abnormalities of gait and mobility: Secondary | ICD-10-CM | POA: Diagnosis present

## 2016-08-31 DIAGNOSIS — Z961 Presence of intraocular lens: Secondary | ICD-10-CM | POA: Diagnosis present

## 2016-08-31 DIAGNOSIS — Z96641 Presence of right artificial hip joint: Secondary | ICD-10-CM | POA: Diagnosis not present

## 2016-08-31 DIAGNOSIS — M160 Bilateral primary osteoarthritis of hip: Secondary | ICD-10-CM | POA: Diagnosis not present

## 2016-08-31 DIAGNOSIS — E785 Hyperlipidemia, unspecified: Secondary | ICD-10-CM | POA: Diagnosis present

## 2016-08-31 DIAGNOSIS — I872 Venous insufficiency (chronic) (peripheral): Secondary | ICD-10-CM | POA: Diagnosis present

## 2016-08-31 DIAGNOSIS — I499 Cardiac arrhythmia, unspecified: Secondary | ICD-10-CM | POA: Diagnosis present

## 2016-08-31 DIAGNOSIS — Z8342 Family history of familial hypercholesterolemia: Secondary | ICD-10-CM | POA: Diagnosis not present

## 2016-08-31 DIAGNOSIS — Z886 Allergy status to analgesic agent status: Secondary | ICD-10-CM

## 2016-08-31 DIAGNOSIS — Z882 Allergy status to sulfonamides status: Secondary | ICD-10-CM | POA: Diagnosis not present

## 2016-08-31 DIAGNOSIS — M180 Bilateral primary osteoarthritis of first carpometacarpal joints: Secondary | ICD-10-CM | POA: Diagnosis present

## 2016-08-31 DIAGNOSIS — Z419 Encounter for procedure for purposes other than remedying health state, unspecified: Secondary | ICD-10-CM

## 2016-08-31 DIAGNOSIS — M329 Systemic lupus erythematosus, unspecified: Secondary | ICD-10-CM | POA: Diagnosis present

## 2016-08-31 DIAGNOSIS — Z8249 Family history of ischemic heart disease and other diseases of the circulatory system: Secondary | ICD-10-CM

## 2016-08-31 DIAGNOSIS — Z87891 Personal history of nicotine dependence: Secondary | ICD-10-CM | POA: Diagnosis not present

## 2016-08-31 DIAGNOSIS — I739 Peripheral vascular disease, unspecified: Secondary | ICD-10-CM | POA: Diagnosis present

## 2016-08-31 DIAGNOSIS — M25551 Pain in right hip: Secondary | ICD-10-CM | POA: Diagnosis not present

## 2016-08-31 HISTORY — PX: TOTAL HIP ARTHROPLASTY: SHX124

## 2016-08-31 SURGERY — ARTHROPLASTY, HIP, TOTAL, ANTERIOR APPROACH
Anesthesia: Monitor Anesthesia Care | Site: Hip | Laterality: Right

## 2016-08-31 MED ORDER — BUPIVACAINE LIPOSOME 1.3 % IJ SUSP: 20.0000 mL | Freq: Once | INTRAMUSCULAR | Status: DC

## 2016-08-31 MED ORDER — ONDANSETRON HCL 4 MG/2ML IJ SOLN: 4.0000 mg | Freq: Four times a day (QID) | INTRAMUSCULAR | Status: DC | PRN

## 2016-08-31 MED ORDER — DOCUSATE SODIUM 100 MG PO CAPS
100.0000 mg | ORAL_CAPSULE | Freq: Two times a day (BID) | ORAL | Status: DC
  Administered 2016-08-31 – 2016-09-02 (×4): 100 mg via ORAL
  Filled 2016-08-31 (×4): qty 1

## 2016-08-31 MED ORDER — ACETAMINOPHEN 650 MG RE SUPP: 650.0000 mg | Freq: Four times a day (QID) | RECTAL | Status: DC | PRN

## 2016-08-31 MED ORDER — TRANEXAMIC ACID 1000 MG/10ML IV SOLN: 2000.0000 mg | Freq: Once | INTRAVENOUS | Status: DC

## 2016-08-31 MED ORDER — ALUM & MAG HYDROXIDE-SIMETH 200-200-20 MG/5ML PO SUSP: 30.0000 mL | ORAL | Status: DC | PRN

## 2016-08-31 MED ORDER — ONDANSETRON HCL 4 MG/2ML IJ SOLN
INTRAMUSCULAR | Status: DC | PRN
  Administered 2016-08-31: 4 mg via INTRAVENOUS

## 2016-08-31 MED ORDER — ONDANSETRON HCL 4 MG PO TABS: 4.0000 mg | ORAL_TABLET | Freq: Four times a day (QID) | ORAL | Status: DC | PRN

## 2016-08-31 MED ORDER — KETAMINE HCL-SODIUM CHLORIDE 100-0.9 MG/10ML-% IV SOSY
PREFILLED_SYRINGE | INTRAVENOUS | Status: AC
  Filled 2016-08-31: qty 10

## 2016-08-31 MED ORDER — AMLODIPINE BESYLATE 5 MG PO TABS
5.0000 mg | ORAL_TABLET | Freq: Every evening | ORAL | Status: DC
  Administered 2016-09-01: 5 mg via ORAL
  Filled 2016-08-31: qty 1

## 2016-08-31 MED ORDER — BISACODYL 5 MG PO TBEC: 5.0000 mg | DELAYED_RELEASE_TABLET | Freq: Every day | ORAL | Status: DC | PRN

## 2016-08-31 MED ORDER — ARTIFICIAL TEARS OP OINT
TOPICAL_OINTMENT | OPHTHALMIC | Status: DC | PRN
  Administered 2016-08-31: 1 via OPHTHALMIC

## 2016-08-31 MED ORDER — PHENYLEPHRINE HCL 10 MG/ML IJ SOLN
INTRAVENOUS | Status: DC | PRN
  Administered 2016-08-31: 50 ug/min via INTRAVENOUS

## 2016-08-31 MED ORDER — METOCLOPRAMIDE HCL 5 MG/ML IJ SOLN: 5.0000 mg | Freq: Three times a day (TID) | INTRAMUSCULAR | Status: DC | PRN

## 2016-08-31 MED ORDER — KCL IN DEXTROSE-NACL 20-5-0.45 MEQ/L-%-% IV SOLN
INTRAVENOUS | Status: DC
  Administered 2016-08-31 – 2016-09-01 (×4): via INTRAVENOUS
  Filled 2016-08-31 (×4): qty 1000

## 2016-08-31 MED ORDER — OXYCODONE HCL 5 MG PO TABS
5.0000 mg | ORAL_TABLET | ORAL | Status: DC | PRN
  Administered 2016-08-31 – 2016-09-02 (×6): 10 mg via ORAL
  Filled 2016-08-31 (×7): qty 2

## 2016-08-31 MED ORDER — GLYCOPYRROLATE 0.2 MG/ML IV SOSY
PREFILLED_SYRINGE | INTRAVENOUS | Status: AC
  Filled 2016-08-31: qty 3

## 2016-08-31 MED ORDER — ARTIFICIAL TEARS OP OINT
TOPICAL_OINTMENT | OPHTHALMIC | Status: AC
  Filled 2016-08-31: qty 3.5

## 2016-08-31 MED ORDER — FENTANYL CITRATE (PF) 100 MCG/2ML IJ SOLN
INTRAMUSCULAR | Status: AC
  Filled 2016-08-31: qty 2

## 2016-08-31 MED ORDER — TRAZODONE HCL 100 MG PO TABS
100.0000 mg | ORAL_TABLET | Freq: Every day | ORAL | Status: DC
Start: 2016-08-31 — End: 2016-09-02
  Administered 2016-08-31 – 2016-09-01 (×2): 100 mg via ORAL
  Filled 2016-08-31 (×2): qty 1

## 2016-08-31 MED ORDER — TRANEXAMIC ACID 1000 MG/10ML IV SOLN
1000.0000 mg | INTRAVENOUS | Status: AC
  Administered 2016-08-31: 1000 mg via INTRAVENOUS
  Filled 2016-08-31: qty 10

## 2016-08-31 MED ORDER — LACTATED RINGERS IV SOLN
INTRAVENOUS | Status: DC
  Administered 2016-08-31 (×2): via INTRAVENOUS

## 2016-08-31 MED ORDER — MENTHOL 3 MG MT LOZG: 1.0000 | LOZENGE | OROMUCOSAL | Status: DC | PRN

## 2016-08-31 MED ORDER — ASPIRIN EC 325 MG PO TBEC: 325.0000 mg | DELAYED_RELEASE_TABLET | Freq: Two times a day (BID) | ORAL | 0 refills | Status: DC

## 2016-08-31 MED ORDER — PROPOFOL 10 MG/ML IV BOLUS
INTRAVENOUS | Status: DC | PRN
  Administered 2016-08-31: 200 mg via INTRAVENOUS

## 2016-08-31 MED ORDER — 0.9 % SODIUM CHLORIDE (POUR BTL) OPTIME
TOPICAL | Status: DC | PRN
  Administered 2016-08-31: 1000 mL

## 2016-08-31 MED ORDER — PHENOL 1.4 % MT LIQD: 1.0000 | OROMUCOSAL | Status: DC | PRN

## 2016-08-31 MED ORDER — TRANEXAMIC ACID 1000 MG/10ML IV SOLN
2000.0000 mg | INTRAVENOUS | Status: AC
  Administered 2016-08-31: 2000 mg via TOPICAL
  Filled 2016-08-31: qty 20

## 2016-08-31 MED ORDER — PROPOFOL 500 MG/50ML IV EMUL
INTRAVENOUS | Status: DC | PRN
  Administered 2016-08-31: 25 ug/kg/min via INTRAVENOUS

## 2016-08-31 MED ORDER — KETAMINE HCL 10 MG/ML IJ SOLN
INTRAMUSCULAR | Status: DC | PRN
  Administered 2016-08-31: 10 mg via INTRAVENOUS
  Administered 2016-08-31 (×2): 20 mg via INTRAVENOUS

## 2016-08-31 MED ORDER — ONDANSETRON HCL 4 MG/2ML IJ SOLN
INTRAMUSCULAR | Status: AC
  Filled 2016-08-31: qty 2

## 2016-08-31 MED ORDER — HYDROMORPHONE HCL 1 MG/ML IJ SOLN
0.2500 mg | INTRAMUSCULAR | Status: DC | PRN
  Administered 2016-08-31 (×2): 0.5 mg via INTRAVENOUS

## 2016-08-31 MED ORDER — HYDROCHLOROTHIAZIDE 25 MG PO TABS
25.0000 mg | ORAL_TABLET | Freq: Every evening | ORAL | Status: DC
  Administered 2016-08-31 – 2016-09-01 (×2): 25 mg via ORAL
  Filled 2016-08-31 (×2): qty 1

## 2016-08-31 MED ORDER — CHLORHEXIDINE GLUCONATE 4 % EX LIQD: 60.0000 mL | Freq: Once | CUTANEOUS | Status: DC

## 2016-08-31 MED ORDER — DEXAMETHASONE SODIUM PHOSPHATE 10 MG/ML IJ SOLN
10.0000 mg | Freq: Once | INTRAMUSCULAR | Status: AC
  Administered 2016-09-01: 10 mg via INTRAVENOUS
  Filled 2016-08-31: qty 1

## 2016-08-31 MED ORDER — SENNOSIDES-DOCUSATE SODIUM 8.6-50 MG PO TABS: 1.0000 | ORAL_TABLET | Freq: Every evening | ORAL | Status: DC | PRN

## 2016-08-31 MED ORDER — METHOCARBAMOL 500 MG PO TABS: 500.0000 mg | ORAL_TABLET | Freq: Four times a day (QID) | ORAL | Status: DC | PRN

## 2016-08-31 MED ORDER — HYDROMORPHONE HCL 1 MG/ML IJ SOLN
INTRAMUSCULAR | Status: AC
  Administered 2016-08-31: 0.5 mg via INTRAVENOUS
  Filled 2016-08-31: qty 1

## 2016-08-31 MED ORDER — ACETAMINOPHEN 325 MG PO TABS: 650.0000 mg | ORAL_TABLET | Freq: Four times a day (QID) | ORAL | Status: DC | PRN

## 2016-08-31 MED ORDER — FLEET ENEMA 7-19 GM/118ML RE ENEM: 1.0000 | ENEMA | Freq: Once | RECTAL | Status: DC | PRN

## 2016-08-31 MED ORDER — MIDAZOLAM HCL 2 MG/2ML IJ SOLN
INTRAMUSCULAR | Status: AC
  Filled 2016-08-31: qty 2

## 2016-08-31 MED ORDER — DEXTROSE 5 % IV SOLN
500.0000 mg | Freq: Four times a day (QID) | INTRAVENOUS | Status: DC | PRN
  Filled 2016-08-31: qty 5

## 2016-08-31 MED ORDER — HYDROCODONE-ACETAMINOPHEN 7.5-325 MG PO TABS
1.0000 | ORAL_TABLET | Freq: Once | ORAL | Status: DC | PRN
Start: 2016-08-31 — End: 2016-08-31

## 2016-08-31 MED ORDER — DIPHENHYDRAMINE HCL 12.5 MG/5ML PO ELIX: 12.5000 mg | ORAL_SOLUTION | ORAL | Status: DC | PRN

## 2016-08-31 MED ORDER — MIDAZOLAM HCL 2 MG/2ML IJ SOLN
INTRAMUSCULAR | Status: DC | PRN
  Administered 2016-08-31 (×2): 1 mg via INTRAVENOUS

## 2016-08-31 MED ORDER — SUCCINYLCHOLINE CHLORIDE 200 MG/10ML IV SOSY
PREFILLED_SYRINGE | INTRAVENOUS | Status: AC
  Filled 2016-08-31: qty 10

## 2016-08-31 MED ORDER — OXYCODONE HCL 10 MG PO TABS: 10.0000 mg | ORAL_TABLET | Freq: Four times a day (QID) | ORAL | 0 refills | Status: DC | PRN

## 2016-08-31 MED ORDER — DEXTROSE-NACL 5-0.45 % IV SOLN: INTRAVENOUS | Status: DC

## 2016-08-31 MED ORDER — TRANEXAMIC ACID 1000 MG/10ML IV SOLN: 1000.0000 mg | INTRAVENOUS | Status: DC

## 2016-08-31 MED ORDER — PROPOFOL 10 MG/ML IV BOLUS
INTRAVENOUS | Status: AC
  Filled 2016-08-31: qty 20

## 2016-08-31 MED ORDER — HYDROMORPHONE HCL 1 MG/ML IJ SOLN
1.0000 mg | INTRAMUSCULAR | Status: DC | PRN
  Administered 2016-08-31 – 2016-09-01 (×4): 1 mg via INTRAVENOUS
  Filled 2016-08-31 (×4): qty 1

## 2016-08-31 MED ORDER — LIDOCAINE HCL (CARDIAC) 20 MG/ML IV SOLN
INTRAVENOUS | Status: DC | PRN
  Administered 2016-08-31: 60 mg via INTRATRACHEAL

## 2016-08-31 MED ORDER — FENTANYL CITRATE (PF) 100 MCG/2ML IJ SOLN
INTRAMUSCULAR | Status: DC | PRN
  Administered 2016-08-31: 25 ug via INTRAVENOUS
  Administered 2016-08-31: 50 ug via INTRAVENOUS
  Administered 2016-08-31: 25 ug via INTRAVENOUS
  Administered 2016-08-31 (×2): 50 ug via INTRAVENOUS

## 2016-08-31 MED ORDER — PROMETHAZINE HCL 25 MG/ML IJ SOLN: 6.2500 mg | INTRAMUSCULAR | Status: DC | PRN

## 2016-08-31 MED ORDER — BUPIVACAINE LIPOSOME 1.3 % IJ SUSP
20.0000 mL | INTRAMUSCULAR | Status: AC
  Administered 2016-08-31: 20 mL
  Filled 2016-08-31: qty 20

## 2016-08-31 MED ORDER — GLYCOPYRROLATE 0.2 MG/ML IJ SOLN
INTRAMUSCULAR | Status: DC | PRN
  Administered 2016-08-31: 0.2 mg via INTRAVENOUS

## 2016-08-31 MED ORDER — BUPIVACAINE-EPINEPHRINE 0.25% -1:200000 IJ SOLN
INTRAMUSCULAR | Status: AC
  Filled 2016-08-31: qty 1

## 2016-08-31 MED ORDER — BUPIVACAINE-EPINEPHRINE 0.25% -1:200000 IJ SOLN
INTRAMUSCULAR | Status: DC | PRN
  Administered 2016-08-31: 50 mL

## 2016-08-31 MED ORDER — DULOXETINE HCL 60 MG PO CPEP
60.0000 mg | ORAL_CAPSULE | Freq: Every day | ORAL | Status: DC
  Administered 2016-09-01 – 2016-09-02 (×2): 60 mg via ORAL
  Filled 2016-08-31 (×2): qty 1

## 2016-08-31 MED ORDER — CEFAZOLIN SODIUM-DEXTROSE 2-4 GM/100ML-% IV SOLN
2.0000 g | INTRAVENOUS | Status: AC
  Administered 2016-08-31: 2 g via INTRAVENOUS
  Filled 2016-08-31: qty 100

## 2016-08-31 MED ORDER — ASPIRIN EC 325 MG PO TBEC: 325.0000 mg | DELAYED_RELEASE_TABLET | Freq: Every day | ORAL | Status: DC

## 2016-08-31 MED ORDER — SUCCINYLCHOLINE CHLORIDE 20 MG/ML IJ SOLN
INTRAMUSCULAR | Status: DC | PRN
  Administered 2016-08-31: 100 mg via INTRAVENOUS

## 2016-08-31 MED ORDER — TIZANIDINE HCL 2 MG PO TABS: 2.0000 mg | ORAL_TABLET | Freq: Four times a day (QID) | ORAL | 0 refills | Status: DC | PRN

## 2016-08-31 MED ORDER — METOCLOPRAMIDE HCL 5 MG PO TABS: 5.0000 mg | ORAL_TABLET | Freq: Three times a day (TID) | ORAL | Status: DC | PRN

## 2016-08-31 MED ORDER — LIDOCAINE 2% (20 MG/ML) 5 ML SYRINGE
INTRAMUSCULAR | Status: AC
  Filled 2016-08-31: qty 5

## 2016-08-31 SURGICAL SUPPLY — 48 items
BLADE SURG ROTATE 9660 (MISCELLANEOUS) IMPLANT
CAPT HIP TOTAL 2 ×3 IMPLANT
COVER PERINEAL POST (MISCELLANEOUS) ×3 IMPLANT
COVER SURGICAL LIGHT HANDLE (MISCELLANEOUS) ×3 IMPLANT
DRAPE C-ARM 42X72 X-RAY (DRAPES) ×3 IMPLANT
DRAPE STERI IOBAN 125X83 (DRAPES) ×3 IMPLANT
DRAPE U-SHAPE 47X51 STRL (DRAPES) ×6 IMPLANT
DRSG AQUACEL AG ADV 3.5X10 (GAUZE/BANDAGES/DRESSINGS) ×3 IMPLANT
DURAPREP 26ML APPLICATOR (WOUND CARE) ×3 IMPLANT
ELECT BLADE 4.0 EZ CLEAN MEGAD (MISCELLANEOUS) ×3
ELECT REM PT RETURN 9FT ADLT (ELECTROSURGICAL) ×3
ELECTRODE BLDE 4.0 EZ CLN MEGD (MISCELLANEOUS) ×1 IMPLANT
ELECTRODE REM PT RTRN 9FT ADLT (ELECTROSURGICAL) ×1 IMPLANT
FACESHIELD WRAPAROUND (MASK) ×6 IMPLANT
GLOVE BIO SURGEON STRL SZ7.5 (GLOVE) ×3 IMPLANT
GLOVE BIO SURGEON STRL SZ8.5 (GLOVE) ×3 IMPLANT
GLOVE BIOGEL PI IND STRL 8 (GLOVE) ×1 IMPLANT
GLOVE BIOGEL PI IND STRL 9 (GLOVE) ×1 IMPLANT
GLOVE BIOGEL PI INDICATOR 8 (GLOVE) ×2
GLOVE BIOGEL PI INDICATOR 9 (GLOVE) ×2
GOWN STRL REUS W/ TWL LRG LVL3 (GOWN DISPOSABLE) ×1 IMPLANT
GOWN STRL REUS W/ TWL XL LVL3 (GOWN DISPOSABLE) ×2 IMPLANT
GOWN STRL REUS W/TWL LRG LVL3 (GOWN DISPOSABLE) ×2
GOWN STRL REUS W/TWL XL LVL3 (GOWN DISPOSABLE) ×4
KIT BASIN OR (CUSTOM PROCEDURE TRAY) ×3 IMPLANT
KIT ROOM TURNOVER OR (KITS) ×3 IMPLANT
MANIFOLD NEPTUNE II (INSTRUMENTS) ×3 IMPLANT
NEEDLE 22X1 1/2 OR ONLY (MISCELLANEOUS) ×4
NEEDLE 22X1.5 STRL (OR ONLY) (MISCELLANEOUS) ×2 IMPLANT
NS IRRIG 1000ML POUR BTL (IV SOLUTION) ×3 IMPLANT
PACK TOTAL JOINT (CUSTOM PROCEDURE TRAY) ×3 IMPLANT
PAD ARMBOARD 7.5X6 YLW CONV (MISCELLANEOUS) ×6 IMPLANT
SAW OSC TIP CART 19.5X105X1.3 (SAW) ×3 IMPLANT
SUT ETHIBOND NAB CT1 #1 30IN (SUTURE) ×6 IMPLANT
SUT VIC AB 0 CT1 27 (SUTURE) ×2
SUT VIC AB 0 CT1 27XBRD ANBCTR (SUTURE) ×1 IMPLANT
SUT VIC AB 1 CTX 36 (SUTURE) ×2
SUT VIC AB 1 CTX36XBRD ANBCTR (SUTURE) ×1 IMPLANT
SUT VIC AB 2-0 CT1 27 (SUTURE) ×6
SUT VIC AB 2-0 CT1 TAPERPNT 27 (SUTURE) ×3 IMPLANT
SUT VIC AB 3-0 PS2 18 (SUTURE) ×2
SUT VIC AB 3-0 PS2 18XBRD (SUTURE) ×1 IMPLANT
SYR CONTROL 10ML LL (SYRINGE) ×6 IMPLANT
TOWEL OR 17X24 6PK STRL BLUE (TOWEL DISPOSABLE) ×3 IMPLANT
TOWEL OR 17X26 10 PK STRL BLUE (TOWEL DISPOSABLE) ×3 IMPLANT
TRAY FOLEY CATH 14FR (SET/KITS/TRAYS/PACK) IMPLANT
WATER STERILE IRR 1000ML POUR (IV SOLUTION) IMPLANT
YANKAUER SUCT BULB TIP NO VENT (SUCTIONS) ×3 IMPLANT

## 2016-08-31 NOTE — Anesthesia Postprocedure Evaluation (Signed)
Anesthesia Post Note  Patient: Katherine Nixon  Procedure(s) Performed: Procedure(s) (LRB): TOTAL HIP ARTHROPLASTY ANTERIOR APPROACH (Right)  Patient location during evaluation: PACU Anesthesia Type: Spinal Level of consciousness: oriented and awake and alert Pain management: pain level controlled Vital Signs Assessment: post-procedure vital signs reviewed and stable Respiratory status: spontaneous breathing, respiratory function stable and patient connected to nasal cannula oxygen Cardiovascular status: blood pressure returned to baseline and stable Postop Assessment: no headache and no backache Anesthetic complications: no    Last Vitals:  Vitals:   08/31/16 1745 08/31/16 1800  BP: (!) 102/51   Pulse:    Resp:    Temp:  36.1 C    Last Pain:  Vitals:   08/31/16 1800  PainSc: Asleep                 Dakwan Pridgen S

## 2016-08-31 NOTE — Anesthesia Procedure Notes (Signed)
Procedure Name: Intubation Date/Time: 08/31/2016 1:44 PM Performed by: Suzette Battiest Pre-anesthesia Checklist: Patient identified, Emergency Drugs available, Suction available and Patient being monitored Patient Re-evaluated:Patient Re-evaluated prior to inductionOxygen Delivery Method: Circle system utilized Preoxygenation: Pre-oxygenation with 100% oxygen Intubation Type: IV induction Ventilation: Mask ventilation without difficulty Laryngoscope Size: Mac and 3 Grade View: Grade I Tube type: Oral Number of attempts: 1 Airway Equipment and Method: Stylet Placement Confirmation: ETT inserted through vocal cords under direct vision,  positive ETCO2 and breath sounds checked- equal and bilateral Secured at: 22 cm Tube secured with: Tape

## 2016-08-31 NOTE — Discharge Instructions (Addendum)
INSTRUCTIONS AFTER JOINT REPLACEMENT  ° °o Remove items at home which could result in a fall. This includes throw rugs or furniture in walking pathways °o ICE to the affected joint every three hours while awake for 30 minutes at a time, for at least the first 3-5 days, and then as needed for pain and swelling.  Continue to use ice for pain and swelling. You may notice swelling that will progress down to the foot and ankle.  This is normal after surgery.  Elevate your leg when you are not up walking on it.   °o Continue to use the breathing machine you got in the hospital (incentive spirometer) which will help keep your temperature down.  It is common for your temperature to cycle up and down following surgery, especially at night when you are not up moving around and exerting yourself.  The breathing machine keeps your lungs expanded and your temperature down. ° ° °DIET:  As you were doing prior to hospitalization, we recommend a well-balanced diet. ° °DRESSING / WOUND CARE / SHOWERING ° °Keep the surgical dressing until follow up.  The dressing is water proof, so you can shower without any extra covering.  IF THE DRESSING FALLS OFF or the wound gets wet inside, change the dressing with sterile gauze.  Please use good hand washing techniques before changing the dressing.  Do not use any lotions or creams on the incision until instructed by your surgeon.   ° °ACTIVITY ° °o Increase activity slowly as tolerated, but follow the weight bearing instructions below.   °o No driving for 6 weeks or until further direction given by your physician.  You cannot drive while taking narcotics.  °o No lifting or carrying greater than 10 lbs. until further directed by your surgeon. °o Avoid periods of inactivity such as sitting longer than an hour when not asleep. This helps prevent blood clots.  °o You may return to work once you are authorized by your doctor.  ° ° ° °WEIGHT BEARING  ° °Weight bearing as tolerated with assist  device (walker, cane, etc) as directed, use it as long as suggested by your surgeon or therapist, typically at least 4-6 weeks. ° ° °EXERCISES ° °Results after joint replacement surgery are often greatly improved when you follow the exercise, range of motion and muscle strengthening exercises prescribed by your doctor. Safety measures are also important to protect the joint from further injury. Any time any of these exercises cause you to have increased pain or swelling, decrease what you are doing until you are comfortable again and then slowly increase them. If you have problems or questions, call your caregiver or physical therapist for advice.  ° °Rehabilitation is important following a joint replacement. After just a few days of immobilization, the muscles of the leg can become weakened and shrink (atrophy).  These exercises are designed to build up the tone and strength of the thigh and leg muscles and to improve motion. Often times heat used for twenty to thirty minutes before working out will loosen up your tissues and help with improving the range of motion but do not use heat for the first two weeks following surgery (sometimes heat can increase post-operative swelling).  ° °These exercises can be done on a training (exercise) mat, on the floor, on a table or on a bed. Use whatever works the best and is most comfortable for you.    Use music or television while you are exercising so that   the exercises are a pleasant break in your day. This will make your life better with the exercises acting as a break in your routine that you can look forward to.   Perform all exercises about fifteen times, three times per day or as directed.  You should exercise both the operative leg and the other leg as well. ° °Exercises include: °  °• Quad Sets - Tighten up the muscle on the front of the thigh (Quad) and hold for 5-10 seconds.   °• Straight Leg Raises - With your knee straight (if you were given a brace, keep it on),  lift the leg to 60 degrees, hold for 3 seconds, and slowly lower the leg.  Perform this exercise against resistance later as your leg gets stronger.  °• Leg Slides: Lying on your back, slowly slide your foot toward your buttocks, bending your knee up off the floor (only go as far as is comfortable). Then slowly slide your foot back down until your leg is flat on the floor again.  °• Angel Wings: Lying on your back spread your legs to the side as far apart as you can without causing discomfort.  °• Hamstring Strength:  Lying on your back, push your heel against the floor with your leg straight by tightening up the muscles of your buttocks.  Repeat, but this time bend your knee to a comfortable angle, and push your heel against the floor.  You may put a pillow under the heel to make it more comfortable if necessary.  ° °A rehabilitation program following joint replacement surgery can speed recovery and prevent re-injury in the future due to weakened muscles. Contact your doctor or a physical therapist for more information on knee rehabilitation.  ° ° °CONSTIPATION ° °Constipation is defined medically as fewer than three stools per week and severe constipation as less than one stool per week.  Even if you have a regular bowel pattern at home, your normal regimen is likely to be disrupted due to multiple reasons following surgery.  Combination of anesthesia, postoperative narcotics, change in appetite and fluid intake all can affect your bowels.  ° °YOU MUST use at least one of the following options; they are listed in order of increasing strength to get the job done.  They are all available over the counter, and you may need to use some, POSSIBLY even all of these options:   ° °Drink plenty of fluids (prune juice may be helpful) and high fiber foods °Colace 100 mg by mouth twice a day  °Senokot for constipation as directed and as needed Dulcolax (bisacodyl), take with full glass of water  °Miralax (polyethylene glycol)  once or twice a day as needed. ° °If you have tried all these things and are unable to have a bowel movement in the first 3-4 days after surgery call either your surgeon or your primary doctor.   ° °If you experience loose stools or diarrhea, hold the medications until you stool forms back up.  If your symptoms do not get better within 1 week or if they get worse, check with your doctor.  If you experience "the worst abdominal pain ever" or develop nausea or vomiting, please contact the office immediately for further recommendations for treatment. ° ° °ITCHING:  If you experience itching with your medications, try taking only a single pain pill, or even half a pain pill at a time.  You can also use Benadryl over the counter for itching or also to   help with sleep.  ° °TED HOSE STOCKINGS:  Use stockings on both legs until for at least 2 weeks or as directed by physician office. They may be removed at night for sleeping. ° °MEDICATIONS:  See your medication summary on the “After Visit Summary” that nursing will review with you.  You may have some home medications which will be placed on hold until you complete the course of blood thinner medication.  It is important for you to complete the blood thinner medication as prescribed. ° °PRECAUTIONS:  If you experience chest pain or shortness of breath - call 911 immediately for transfer to the hospital emergency department.  ° °If you develop a fever greater that 101 F, purulent drainage from wound, increased redness or drainage from wound, foul odor from the wound/dressing, or calf pain - CONTACT YOUR SURGEON.   °                                                °FOLLOW-UP APPOINTMENTS:  If you do not already have a post-op appointment, please call the office for an appointment to be seen by your surgeon.  Guidelines for how soon to be seen are listed in your “After Visit Summary”, but are typically between 1-4 weeks after surgery. ° °OTHER INSTRUCTIONS:  ° °Knee  Replacement:  Do not place pillow under knee, focus on keeping the knee straight while resting. CPM instructions: 0-90 degrees, 2 hours in the morning, 2 hours in the afternoon, and 2 hours in the evening. Place foam block, curve side up under heel at all times except when in CPM or when walking.  DO NOT modify, tear, cut, or change the foam block in any way. ° °MAKE SURE YOU:  °• Understand these instructions.  °• Get help right away if you are not doing well or get worse.  ° ° °Thank you for letting us be a part of your medical care team.  It is a privilege we respect greatly.  We hope these instructions will help you stay on track for a fast and full recovery!  ° ° ° ° °Information on my medicine - ELIQUIS® (apixaban) ° °This medication education was reviewed with me or my healthcare representative as part of my discharge preparation.  The pharmacist that spoke with me during my hospital stay was:  Delani Kohli Dien, RPH ° °Why was Eliquis® prescribed for you? °Eliquis® was prescribed for you to reduce the risk of blood clots forming after orthopedic surgery.   ° °What do You need to know about Eliquis®? °Take your Eliquis® TWICE DAILY - one tablet in the morning and one tablet in the evening with or without food.  It would be best to take the dose about the same time each day. ° °If you have difficulty swallowing the tablet whole please discuss with your pharmacist how to take the medication safely. ° °Take Eliquis® exactly as prescribed by your doctor and DO NOT stop taking Eliquis® without talking to the doctor who prescribed the medication.  Stopping without other medication to take the place of Eliquis® may increase your risk of developing a clot. ° °After discharge, you should have regular check-up appointments with your healthcare provider that is prescribing your Eliquis®. ° °What do you do if you miss a dose? °If a dose of ELIQUIS® is not taken at the   scheduled time, take it as soon as possible on the same  day and twice-daily administration should be resumed.  The dose should not be doubled to make up for a missed dose.  Do not take more than one tablet of ELIQUIS at the same time. ° °Important Safety Information °A possible side effect of Eliquis® is bleeding. You should call your healthcare provider right away if you experience any of the following: °? Bleeding from an injury or your nose that does not stop. °? Unusual colored urine (red or dark brown) or unusual colored stools (red or black). °? Unusual bruising for unknown reasons. °? A serious fall or if you hit your head (even if there is no bleeding). ° °Some medicines may interact with Eliquis® and might increase your risk of bleeding or clotting while on Eliquis®. To help avoid this, consult your healthcare provider or pharmacist prior to using any new prescription or non-prescription medications, including herbals, vitamins, non-steroidal anti-inflammatory drugs (NSAIDs) and supplements. ° °This website has more information on Eliquis® (apixaban): http://www.eliquis.com/eliquis/home ° °

## 2016-08-31 NOTE — Interval H&P Note (Signed)
No interval change in H&P

## 2016-08-31 NOTE — Transfer of Care (Signed)
Immediate Anesthesia Transfer of Care Note  Patient: Katherine Nixon  Procedure(s) Performed: Procedure(s): TOTAL HIP ARTHROPLASTY ANTERIOR APPROACH (Right)  Patient Location: PACU  Anesthesia Type:General  Level of Consciousness: awake, alert  and patient cooperative  Airway & Oxygen Therapy: Patient Spontanous Breathing and Patient connected to face mask oxygen  Post-op Assessment: Report given to RN and Post -op Vital signs reviewed and stable  Post vital signs: Reviewed and stable  Last Vitals:  Vitals:   08/31/16 1047  BP: (!) 148/57  Pulse: 73  Resp: 20  Temp: 36.8 C    Last Pain: There were no vitals filed for this visit.       Complications: No apparent anesthesia complications

## 2016-08-31 NOTE — Anesthesia Preprocedure Evaluation (Addendum)
Anesthesia Evaluation  Patient identified by MRN, date of birth, ID band Patient awake    Reviewed: Allergy & Precautions, NPO status , Patient's Chart, lab work & pertinent test results  Airway Mallampati: II  TM Distance: >3 FB Neck ROM: Full    Dental   Pulmonary former smoker,    breath sounds clear to auscultation       Cardiovascular hypertension, Pt. on medications + Peripheral Vascular Disease   Rhythm:Regular Rate:Normal     Neuro/Psych Anxiety Depression negative neurological ROS     GI/Hepatic Neg liver ROS, GERD  ,  Endo/Other  negative endocrine ROS  Renal/GU negative Renal ROS     Musculoskeletal  (+) Arthritis ,   Abdominal   Peds  Hematology negative hematology ROS (+)   Anesthesia Other Findings   Reproductive/Obstetrics                             Lab Results  Component Value Date   WBC 5.8 08/22/2016   HGB 12.7 08/22/2016   HCT 40.6 08/22/2016   MCV 96.0 08/22/2016   PLT 199 08/22/2016   Lab Results  Component Value Date   CREATININE 0.74 08/22/2016   BUN 14 08/22/2016   NA 139 08/22/2016   K 4.0 08/22/2016   CL 103 08/22/2016   CO2 30 08/22/2016   Lab Results  Component Value Date   INR 1.13 08/22/2016    Anesthesia Physical Anesthesia Plan  ASA: II  Anesthesia Plan: MAC and Spinal   Post-op Pain Management:    Induction: Intravenous  Airway Management Planned: Natural Airway and Simple Face Mask  Additional Equipment:   Intra-op Plan:   Post-operative Plan:   Informed Consent: I have reviewed the patients History and Physical, chart, labs and discussed the procedure including the risks, benefits and alternatives for the proposed anesthesia with the patient or authorized representative who has indicated his/her understanding and acceptance.     Plan Discussed with: CRNA  Anesthesia Plan Comments:        Anesthesia Quick  Evaluation

## 2016-08-31 NOTE — Op Note (Signed)
OPERATIVE REPORT    DATE OF PROCEDURE:  08/31/2016       PREOPERATIVE DIAGNOSIS:  PRIMARY OSTEOARTHRITIS OF THE RIGHT HIP M19.90                                                          POSTOPERATIVE DIAGNOSIS:  PRIMARY OSTEOARTHRITIS OF THE RIGHT HIP M19.90                                                           PROCEDURE: Anterior R total hip arthroplasty using a 52 mm DePuy Pinnacle  Cup, [employer], 0-degree polyethylene liner, a +1.5 36 mm ceramic head, a 5 Depuy Triloc stem   SURGEON: Dyshawn Cangelosi J    ASSISTANT:   [employer]  (present throughout entire procedure and necessary for timely completion of the procedure)   ANESTHESIA: GET BLOOD LOSS: 400 FLUID REPLACEMENT: 1600 crystalloid Antibiotic: 2gm ancef Tranexamic Acid: 1gm iv, 2gm topical COMPLICATIONS: none    INDICATIONS FOR PROCEDURE: A 70 y.o. year-old With  PRIMARY OSTEOARTHRITIS OF THE RIGHT HIP M19.90   for 3 years, x-rays show bone-on-bone arthritic changes, and osteophytes. Despite conservative measures with observation, anti-inflammatory medicine, narcotics, use of a cane, has severe unremitting pain and can ambulate only a few blocks before resting. Patient desires elective R total hip arthroplasty to decrease pain and increase function. The risks, benefits, and alternatives were discussed at length including but not limited to the risks of infection, bleeding, nerve injury, stiffness, blood clots, the need for revision surgery, cardiopulmonary complications, among others, and they were willing to proceed. Questions answered     PROCEDURE IN DETAIL: The patient was identified by armband,  received preoperative IV antibiotics in the holding area at [facility], taken to the operating room , appropriate anesthetic monitors  were attached and  anesthesia was induced with the patienton the gurney. The HANA boots were applied to the feet and he was then transferred to the HANA table  with a peroneal post and support underneath the non-operative le, which was locked in 5 lb traction. Theoperative lower extremity was then prepped and draped in the usual sterile fashion from just above the iliac crest to the knee. And a timeout procedure was performed. We then made a 12 cm incision along the interval at the leading edge of the tensor fascia lata of starting at 2 cm lateral to and 2 cm distal to the ASIS. Small bleeders in the skin and subcutaneous tissue identified and cauterized we dissected down to the fascia and made an incision in the fascia allowing [place] to elevate the fascia of the tensor muscle and exploited the interval between the rectus and the tensor fascia lata. A Hohmann retractor was then placed along the superior neck of the femur and a Cobra retractor along the inferior neck of the femur we teed the capsule starting out at the superior anterior aspect of the acetabulum going distally and made the T along the neck both leaflets of the T were tagged with #2 Ethibond suture. Cobra retractors were then placed along the inferior and  superior neck allowing [place] to perform a standard neck cut and removed the femoral head with a power corkscrew. We then placed a right angle Hohmann retractor along the anterior aspect of the acetabulum a spiked Cobra in the cotyloid notch and posteriorly a Muelller retractor. We then sequentially reamed up to a 51 mm basket reamer obtaining good coverage in all quadrants, verified by C-arm imaging. Under C-arm control with and hammered into place a 52 mm Pinnacle cup in 45 of abduction and 15 of anteversion. The cup seated nicely and required no supplemental screws. We then placed a central hole Eliminator and a 0 polyethylene liner. The foot was then externally rotated to 110, the HANA elevator was placed around the flare of the greater trochanter and the limb was extended and abducted delivering the proximal femur up into the wound. A medium Hohmann  retractor was placed over the greater trochanter and a Mueller retractor along the posterior femoral neck completing the exposure. We then performed releases superiorly and and inferiorly of the capsule going back to the pirformis fossa superiorly and to the lesser trochanter inferiorly. We then entered the proximal femur with the box cutting offset chisel followed by, a canal sounder, the chili pepper and broaching up to a 5 broach. This seated nicely and we reamed the calcar. A trial reduction was performed with a 1.5 mm 36 mm head.The limb lengths were excellent the hip was stable in 90 of external rotation. At this point the trial components removed and we hammered into place a # 5 Tri-Lock stem with Gryption coating. This was a std offset stem and a + 1.5 36 mm ceramic ball was then hammered into place the hip was reduced and final C-arm images obtained. The wound was thoroughly irrigated with normal saline solution. We repaired the ant capsule and the tensor fascia lot a with running 0 vicryl suture. the subcutaneous tissue was closed with 2-0 and 3-0 Vicryl suture followed by an Aquacil dressing. At this point the patient was awaken and transferred to hospital gurney without difficulty. The subcutaneous tissue with 0 and 2-0 undyed Vicryl suture and the skin with running  3-0 vicryl subcuticular suture. Aquacil dressing was applied. The patient was then unclamped, rolled supine, awaken extubated and taken to recovery room without difficulty in stable condition.   Tannisha Kennington J 08/31/2016, 3:12 PM

## 2016-09-01 ENCOUNTER — Encounter (HOSPITAL_COMMUNITY): Payer: Self-pay | Admitting: General Practice

## 2016-09-01 LAB — CBC
HCT: 30.3 % — ABNORMAL LOW (ref 36.0–46.0)
HEMOGLOBIN: 9.6 g/dL — AB (ref 12.0–15.0)
MCH: 30 pg (ref 26.0–34.0)
MCHC: 31.7 g/dL (ref 30.0–36.0)
MCV: 94.7 fL (ref 78.0–100.0)
Platelets: 174 10*3/uL (ref 150–400)
RBC: 3.2 MIL/uL — ABNORMAL LOW (ref 3.87–5.11)
RDW: 14.6 % (ref 11.5–15.5)
WBC: 6.3 10*3/uL (ref 4.0–10.5)

## 2016-09-01 MED ORDER — APIXABAN 2.5 MG PO TABS
2.5000 mg | ORAL_TABLET | Freq: Two times a day (BID) | ORAL | Status: DC
  Administered 2016-09-01 – 2016-09-02 (×3): 2.5 mg via ORAL
  Filled 2016-09-01 (×3): qty 1

## 2016-09-01 NOTE — Progress Notes (Signed)
PATIENT ID: Katherine Nixon  MRN: [id number]  DOB/AGE:  [birth year] / 70 y.o.  1 Day Post-Op Procedure(s) (LRB): TOTAL HIP ARTHROPLASTY ANTERIOR APPROACH (Right)    PROGRESS NOTE Subjective: Patient is alert, oriented, No Nausea, no Vomiting, yes passing gas, . Taking PO well. Denies SOB, Chest or Calf Pain. Using Incentive Spirometer, PAS in place. Ambulate WBAT Patient reports pain as  3/10  .    Objective: Vital signs in last 24 hours: Vitals:   08/31/16 1838 08/31/16 2025 09/01/16 0000 09/01/16 0500  BP: (!) 107/50 (!) 126/51 (!) 96/48 (!) 93/36  Pulse: (!) 59 66 65 80  Resp: 16 16 16 17   Temp: 97.6 F (36.4 C) 98 F (36.7 C) 98.2 F (36.8 C) 98.9 F (37.2 C)  TempSrc: Oral Oral Oral Oral  SpO2: 92% 99% 95% 97%  Weight:      Height:          Intake/Output from previous day: I/O last 3 completed shifts: In: 3110.4 [P.O.:600; I.V.:2510.4] Out: 1525 [Urine:1200; Blood:325]   Intake/Output this shift: No intake/output data recorded.   LABORATORY DATA:  Recent Labs  09/01/16 0405  WBC 6.3  HGB 9.6*  HCT 30.3*  PLT 174    Examination: Neurologically intact ABD soft Neurovascular intact Sensation intact distally Intact pulses distally Dorsiflexion/Plantar flexion intact Incision: dressing C/D/I No cellulitis present Compartment soft} XR AP&Lat of hip shows well placed\fixed THA  Assessment:   1 Day Post-Op Procedure(s) (LRB): TOTAL HIP ARTHROPLASTY ANTERIOR APPROACH (Right) ADDITIONAL DIAGNOSIS:  Expected Acute Blood Loss Anemia, Hypertension, Lupus  Plan: PT/OT WBAT, THA  DVT Prophylaxis: SCDx72 hrs,Eliquis 2.5 mg POBID x 2 weeks, NSAID allergy  DISCHARGE PLAN: Home  DISCHARGE NEEDS: HHPT, Walker and 3-in-1 comode seat

## 2016-09-01 NOTE — Evaluation (Signed)
Physical Therapy Evaluation Patient Details Name: Katherine Nixon MRN: [id number] DOB: [birth year] Today's Date: 09/01/2016   History of Present Illness  70 y.o. female now s/p direct anterior Rt THA. PMH: lupus, HTN, depression, anxiety, Lt shoulder surgery.  Clinical Impression  Pt is s/p Rt direct anterior THA resulting in the deficits listed below (see PT Problem List). Pt mobilizing slowly during initial PT session, able to ambulate 5 ft with rw and min guard assistance. Pt reporting that she does not want to consider going anywhere except home following her hospital stay. Recommending home with family and HHPT services at this time but will make modifications as needed depending upon the patient's ability to progress with her mobility.    Follow Up Recommendations Home health PT;Supervision for mobility/OOB    Equipment Recommendations  None recommended by PT    Recommendations for Other Services       Precautions / Restrictions Precautions Precautions: Fall Restrictions Weight Bearing Restrictions: Yes RLE Weight Bearing: Weight bearing as tolerated      Mobility  Bed Mobility Overal bed mobility: Needs Assistance Bed Mobility: Supine to Sit     Supine to sit: Mod assist     General bed mobility comments: assist provided with LEs and trunk to come to sitting. Using pad to assist with moving pelvis in bed and to sitting.   Transfers Overall transfer level: Needs assistance Equipment used: Rolling walker (2 wheeled) Transfers: Sit to/from Stand Sit to Stand: Mod assist         General transfer comment: cues and physical assist needed. Unsteady with initial standing.   Ambulation/Gait Ambulation/Gait assistance: Min guard Ambulation Distance (Feet): 5 Feet Assistive device: Rolling walker (2 wheeled) Gait Pattern/deviations: Step-to pattern;Decreased step length - right;Decreased step length - left;Decreased weight shift to right;Decreased stance time -  right Gait velocity: very slow pattern   General Gait Details: Pt moving very slowly with small strides. Repeated cues needed for gait sequence.   Stairs            Wheelchair Mobility    Modified Rankin (Stroke Patients Only)       Balance Overall balance assessment: Needs assistance Sitting-balance support: Single extremity supported Sitting balance-Leahy Scale: Fair     Standing balance support: Bilateral upper extremity supported Standing balance-Leahy Scale: Poor Standing balance comment: using rw                              Pertinent Vitals/Pain Pain Assessment: 0-10 Pain Score: 5  Pain Location: Rt hip/thigh Pain Descriptors / Indicators: Sore Pain Intervention(s): Limited activity within patient's tolerance;Monitored during session    Home Living Family/patient expects to be discharged to:: Private residence Living Arrangements: Spouse/significant other Available Help at Discharge: Family;Available 24 hours/day Type of Home: House Home Access: Stairs to enter Entrance Stairs-Rails: Psychiatric nurse of Steps: 3 Home Layout: Able to live on main level with bedroom/bathroom;Two level Home Equipment: Walker - 2 wheels;Cane - single point;Bedside commode;Shower seat;Wheelchair - manual Additional Comments: Pt reports that her spouse will be with her when she returns home.     Prior Function Level of Independence: Independent with assistive device(s)         Comments: using SPC as needed     Hand Dominance        Extremity/Trunk Assessment   Upper Extremity Assessment: Generalized weakness           Lower Extremity Assessment: Generalized weakness  RLE Deficits / Details: needing assist to move LE to EOB       Communication   Communication: No difficulties  Cognition Arousal/Alertness: Awake/alert Behavior During Therapy: WFL for tasks assessed/performed Overall Cognitive Status: No family/caregiver present  to determine baseline cognitive functioning                 General Comments: Pt with mild confusion, unable to recall eating breakfast but nursing confirming that she infact did.     General Comments      Exercises     Assessment/Plan    PT Assessment Patient needs continued PT services  PT Problem List Decreased strength;Decreased range of motion;Decreased activity tolerance;Decreased balance;Decreased mobility          PT Treatment Interventions DME instruction;Gait training;Stair training;Functional mobility training;Therapeutic activities;Therapeutic exercise;Patient/family education    PT Goals (Current goals can be found in the Care Plan section)  Acute Rehab PT Goals Patient Stated Goal: go home with her husband after the hospital.  PT Goal Formulation: With patient Time For Goal Achievement: 09/15/16 Potential to Achieve Goals: Good    Frequency 7X/week   Barriers to discharge        Co-evaluation               End of Session Equipment Utilized During Treatment: Gait belt Activity Tolerance: Patient tolerated treatment well Patient left: in chair;with call bell/phone within reach Nurse Communication: Mobility status;Weight bearing status         Time: 1021-1100 PT Time Calculation (min) (ACUTE ONLY): 39 min   Charges:   PT Evaluation $PT Eval Moderate Complexity: 1 Procedure PT Treatments $Therapeutic Activity: 23-37 mins   PT G Codes:        Cassell Clement, PT, CSCS Pager [phone] Office [phone]  09/01/2016, 11:10 AM

## 2016-09-01 NOTE — Progress Notes (Signed)
Occupational Therapy Evaluation Patient Details Name: Katherine Nixon MRN: [id number] DOB: [birth year] Today's Date: 09/01/2016    History of Present Illness 70 y.o. female now s/p direct anterior Rt THA. PMH: lupus, HTN, depression, anxiety, Lt shoulder surgery.   Clinical Impression   Limited evaluation as pt falling asleep during conversation. OT will follow to address ADL education prior to discharge.     Follow Up Recommendations  Home health OT;Supervision/Assistance - 24 hour    Equipment Recommendations  None recommended by OT    Recommendations for Other Services       Precautions / Restrictions Precautions Precautions: Fall Restrictions Weight Bearing Restrictions: Yes RLE Weight Bearing: Weight bearing as tolerated      Mobility Bed Mobility            General bed mobility comments: NT -- OOB in recliner  Transfers             General transfer comment: NT -- pt falling asleep during conversation    Balance                                      ADL Overall ADL's : Needs assistance/impaired Eating/Feeding: Set up;Sitting   Grooming: Set up;Sitting   Upper Body Bathing: Maximal assistance   Lower Body Bathing: Maximal assistance   Upper Body Dressing : Maximal assistance   Lower Body Dressing: Maximal assistance                 General ADL Comments: Chair-level evaluation, very limited as pt falling asleep multiple times during evaluation. Educated pt and and husband on role of OT and OT POC. Will follow to educate on LB self-care, toilet transfer, shower transfer for safe discharge.     Vision     Perception     Praxis      Pertinent Vitals/Pain Pain Assessment: 0-10 Pain Score: 4  Pain Location: R hip/thigh Pain Descriptors / Indicators: Sore Pain Intervention(s): Limited activity within patient's tolerance;Monitored during session     Hand Dominance Right   Extremity/Trunk Assessment Upper  Extremity Assessment Upper Extremity Assessment: RUE deficits/detail;LUE deficits/detail RUE Deficits / Details: Limited shoulder ROM; otherwise WFL ROM and strength LUE Deficits / Details: Limited shoulder ROM; otherwise WFL ROM and strength   Lower Extremity Assessment Lower Extremity Assessment: Defer to PT evaluation        Communication Communication Communication: No difficulties   Cognition Arousal/Alertness: Lethargic;Suspect due to medications Behavior During Therapy: [facility] for tasks assessed/performed Overall Cognitive Status: Difficult to assess                 General Comments: Patient very lethargic, falling asleep during eval, husband provided most of the history.   General Comments       Exercises       Shoulder Instructions      Home Living Family/patient expects to be discharged to:: Private residence Living Arrangements: Spouse/significant other;Other relatives (granddaughter) Available Help at Discharge: Family;Available 24 hours/day Type of Home: House Home Access: Stairs to enter [employer] of Steps: 3 Entrance Stairs-Rails: Right;Left Home Layout: Able to live on main level with bedroom/bathroom;Two level     Bathroom Shower/Tub: Occupational psychologist:  (comfort height) Bathroom Accessibility: Yes How Accessible: Accessible via walker Home Equipment: [place] - 2 wheels;Cane - single point;Bedside commode;Shower seat;Wheelchair - manual   Additional Comments: Spouse present during eval; he  and granddaughter will be available to assist pt when she returns home      Prior Functioning/Environment Level of Independence: Independent with assistive device(s)        Comments: using SPC as needed; needed A with socks and shoes, I laundry, shares household/cooking duties with spouse        OT Problem List: Decreased strength;Decreased range of motion;Decreased activity tolerance;Impaired balance (sitting and/or  standing);Decreased safety awareness;Decreased knowledge of use of DME or AE;Pain   OT Treatment/Interventions: Self-care/ADL training;DME and/or AE instruction;Therapeutic activities;Patient/family education    OT Goals(Current goals can be found in the care plan section) Acute Rehab OT Goals Patient Stated Goal: go home with her husband after the hospital.  OT Goal Formulation: With patient Time For Goal Achievement: 09/15/16 Potential to Achieve Goals: Good ADL Goals Pt Will Perform Lower Body Bathing: with min assist;sit to/from stand Pt Will Perform Lower Body Dressing: with min assist;sit to/from stand Pt Will Transfer to Toilet: with min assist;bedside commode Pt Will Perform Toileting - Clothing Manipulation and hygiene: with min assist;sit to/from stand Pt Will Perform Tub/Shower Transfer: Shower transfer;with min assist;ambulating;shower seat;rolling walker  OT Frequency: Min 2X/week   Barriers to D/C:            Co-evaluation              End of Session    Activity Tolerance: Patient limited by lethargy Patient left: in chair;with call bell/phone within reach;with family/visitor present   Time: [id number] OT Time Calculation (min): 10 min Charges:  OT General Charges $OT Visit: 1 Procedure OT Evaluation $OT Eval Low Complexity: 1 Procedure G-Codes:    Katherine Nixon A 2016-09-04, 11:56 AM

## 2016-09-01 NOTE — Care Management Note (Signed)
Case Management Note  Patient Details  Name: Katherine Nixon MRN: [id number] Date of Birth: 1946/04/30  Subjective/Objective:  Right THA                  Action/Plan: Discharge Planning: NCM spoke to pt and offered choice HH. Preoperatively set up with [facility]. Pt agreeable to [facility]. Pt states she has RW, and cane at home. Requested 3n1 bedside commode for home. Husband at home to assist with her care.   PCP- Beatrice Lecher D    Expected Discharge Date:                Expected Discharge Plan:  [place]  In-House Referral:  NA  Discharge planning Services  CM Consult  Post Acute Care Choice:  Home Health Choice offered to:  Patient  DME Arranged:  3n1 bedside commode DME Agency:  [place]:  PT [place] Agency:  [place]  Status of Service:  Completed, signed off  If discussed at [place] of Stay Meetings, dates discussed:    Additional Comments:  Erenest Rasher, RN 09/01/2016, 2:30 PM

## 2016-09-01 NOTE — Consult Note (Signed)
[facility] CM Primary Care Navigator  09/01/2016  Katherine Nixon 1945-12-14 [id number]  Met with patient and husband Katherine Nixon) at the bedside to identify possible discharge needs. Patient states having too much pain to right hip which is limiting her mobility and activities that led to this admission/ surgery. Patient endorses Dr. Beatrice Lecher with [facility] as the primary care provider.    Patient shared using [facility] for regular prescriptions and [facility] for immediate needs. Patient and husband denies difficulty to obtain and afford medications at this time. Patient verbalized doing her own medication management using "pill box" system at home.   Patient's husband provides transportation to her doctors' appointments and he is the primary caregiver at home and will be assisted by granddaughter Katherine Nixon). MD note states discharge plan to home with HHPT, Walker and 3-in-1 commode seat.  Patient and husband voiced understanding to call primary care provider's office for a post discharge follow-up appointment within a week or sooner if needs arise. Patient letter provided as a reminder.  Both denies further needs or concerns at this time aside from the need for bedside commode for home which was relayed to the therapist (Katherine Nixon) and had been recommended per MD note.   For additional questions please contact:  Edwena Felty A. Benjermin Korber, BSN, RN-BC [facility] PRIMARY CARE Navigator Cell: [phone]

## 2016-09-01 NOTE — Progress Notes (Signed)
Physical Therapy Treatment Patient Details Name: Katherine Nixon MRN: [id number] DOB: [birth year] Today's Date: 09/01/2016    History of Present Illness 70 y.o. female now s/p direct anterior Rt THA. PMH: lupus, HTN, depression, anxiety, Lt shoulder surgery.    PT Comments    Pt able to improve her ambulation distance to 55 ft with rw and min guard assistance. Anticipate that the pt will D/C to home with family support. PT to continue to work with pt to maximize her independence and general mobility.   Follow Up Recommendations  Home health PT;Supervision for mobility/OOB     Equipment Recommendations  None recommended by PT    Recommendations for Other Services       Precautions / Restrictions Precautions Precautions: Fall Restrictions Weight Bearing Restrictions: Yes RLE Weight Bearing: Weight bearing as tolerated    Mobility  Bed Mobility Overal bed mobility: Needs Assistance Bed Mobility: Sit to Supine       Sit to supine: Mod assist   General bed mobility comments: Assist needed with bilateral LEs  Transfers Overall transfer level: Needs assistance Equipment used: Rolling walker (2 wheeled) Transfers: Sit to/from Stand Sit to Stand: Mod assist         General transfer comment: cues for hand position  Ambulation/Gait Ambulation/Gait assistance: Min guard Ambulation Distance (Feet): 55 Feet Assistive device: Rolling walker (2 wheeled) Gait Pattern/deviations: Step-to pattern;Decreased weight shift to right;Decreased stance time - right Gait velocity: decreased   General Gait Details: Distance limited by fatigue.    Stairs            Wheelchair Mobility    Modified Rankin (Stroke Patients Only)       Balance Overall balance assessment: Needs assistance Sitting-balance support: No upper extremity supported Sitting balance-Leahy Scale: Fair     Standing balance support: Bilateral upper extremity supported Standing balance-Leahy Scale:  Poor Standing balance comment: using rw                    Cognition Arousal/Alertness: Awake/alert Behavior During Therapy: WFL for tasks assessed/performed Overall Cognitive Status: Within Functional Limits for tasks assessed                      Exercises Total Joint Exercises Ankle Circles/Pumps: AROM;Both;15 reps Quad Sets: Strengthening;10 reps;Right Heel Slides: AAROM;10 reps;Right    General Comments        Pertinent Vitals/Pain Pain Assessment: Faces Faces Pain Scale: Hurts little more Pain Location: Rt hip Pain Descriptors / Indicators: Sore Pain Intervention(s): Limited activity within patient's tolerance;Monitored during session    Home Living                      Prior Function            PT Goals (current goals can now be found in the care plan section) Acute Rehab PT Goals Patient Stated Goal: go home with her husband after the hospital.  PT Goal Formulation: With patient Time For Goal Achievement: 09/15/16 Potential to Achieve Goals: Good Progress towards PT goals: Progressing toward goals    Frequency    7X/week      PT Plan Current plan remains appropriate    Co-evaluation             End of Session Equipment Utilized During Treatment: Gait belt Activity Tolerance: Patient limited by fatigue Patient left: in bed;with call bell/phone within reach;with SCD's reapplied;with family/visitor present     Time: [id number]  PT Time Calculation (min) (ACUTE ONLY): 26 min  Charges:  $Gait Training: 8-22 mins $Therapeutic Exercise: 8-22 mins                    G Codes:      Cassell Clement, PT, CSCS Pager [phone] Office [phone]  09/01/2016, 3:42 PM

## 2016-09-02 LAB — CBC
HCT: 28.1 % — ABNORMAL LOW (ref 36.0–46.0)
Hemoglobin: 8.9 g/dL — ABNORMAL LOW (ref 12.0–15.0)
MCH: 29.3 pg (ref 26.0–34.0)
MCHC: 31.7 g/dL (ref 30.0–36.0)
MCV: 92.4 fL (ref 78.0–100.0)
Platelets: 140 10*3/uL — ABNORMAL LOW (ref 150–400)
RBC: 3.04 MIL/uL — ABNORMAL LOW (ref 3.87–5.11)
RDW: 14.5 % (ref 11.5–15.5)
WBC: 7.1 10*3/uL (ref 4.0–10.5)

## 2016-09-02 MED ORDER — APIXABAN 2.5 MG PO TABS: 2.5000 mg | ORAL_TABLET | Freq: Two times a day (BID) | ORAL | 0 refills | Status: DC

## 2016-09-02 NOTE — Discharge Summary (Signed)
Patient ID: Sorrel Hartstein MRN: [id number] DOB/AGE: 03-28-1946 70 y.o.  Admit date: 08/31/2016 Discharge date: 09/02/2016  Admission Diagnoses:  Principal Problem:   Primary localized osteoarthritis of right hip Active Problems:   Arthritis of right hip   Discharge Diagnoses:  Same  Past Medical History:  Diagnosis Date  . Anxiety   . Arthritis   . Chronic diarrhea   . Complication of anesthesia    diff to wake up with gallbaldder surgery- no problems since   . Depression   . Dysrhythmia     states at times  . Headache   . Hypertension   . Neuromuscular disorder (HCC)    lupus  . Squamous cell carcinoma     Surgeries: Procedure(s): TOTAL HIP ARTHROPLASTY ANTERIOR APPROACH on 08/31/2016   Consultants:   Discharged Condition: Improved  Hospital Course: Roxy Waight is an 70 y.o. female who was admitted 08/31/2016 for operative treatment ofPrimary localized osteoarthritis of right hip. Patient has severe unremitting pain that affects sleep, daily activities, and work/hobbies. After pre-op clearance the patient was taken to the operating room on 08/31/2016 and underwent  Procedure(s): TOTAL HIP ARTHROPLASTY ANTERIOR APPROACH.    Patient was given perioperative antibiotics: Anti-infectives    Start     Dose/Rate Route Frequency Ordered Stop   08/31/16 1230  ceFAZolin (ANCEF) IVPB 2g/100 mL premix     2 g 200 mL/hr over 30 Minutes Intravenous On call to O.R. 08/31/16 1043 08/31/16 1337       Patient was given sequential compression devices, early ambulation, and chemoprophylaxis to prevent DVT.  Patient benefited maximally from hospital stay and there were no complications.    Recent vital signs: Patient Vitals for the past 24 hrs:  BP Temp Temp src Pulse Resp SpO2  09/02/16 0427 (!) 110/46 98 F (36.7 C) Oral 65 16 95 %  09/01/16 2021 (!) 120/43 98.3 F (36.8 C) Oral 67 16 93 %  09/01/16 1500 (!) 132/52 98.4 F (36.9 C) Oral 78 17 90 %  09/01/16 0900 -  99.5 F (37.5 C) - - - -     Recent laboratory studies:  Recent Labs  09/01/16 0405 09/02/16 0645  WBC 6.3 7.1  HGB 9.6* 8.9*  HCT 30.3* 28.1*  PLT 174 140*     Discharge Medications:     Medication List    STOP taking these medications   meloxicam 15 MG tablet Commonly known as:  MOBIC     TAKE these medications   AMBULATORY NON FORMULARY MEDICATION Medication Name: TDAP   amLODipine 5 MG tablet Commonly known as:  NORVASC TAKE 1 TABLET DAILY What changed:  See the new instructions.   apixaban 2.5 MG Tabs tablet Commonly known as:  ELIQUIS Take 1 tablet (2.5 mg total) by mouth 2 (two) times daily.   atorvastatin 20 MG tablet Commonly known as:  LIPITOR TAKE 1 TABLET DAILY   cholecalciferol 1000 units tablet Commonly known as:  VITAMIN D Take 1,000 Units by mouth daily.   DULoxetine 60 MG capsule Commonly known as:  CYMBALTA Take 1 capsule (60 mg total) by mouth daily. What changed:  when to take this   fluticasone 50 MCG/ACT nasal spray Commonly known as:  FLONASE Place 2 sprays into both nostrils daily.   hydrochlorothiazide 25 MG tablet Commonly known as:  HYDRODIURIL TAKE 1 TABLET DAILY What changed:  See the new instructions.   ipratropium 0.03 % nasal spray Commonly known as:  ATROVENT Place 2 sprays into both nostrils  every 12 (twelve) hours.   Oxycodone HCl 10 MG Tabs Take 1 tablet (10 mg total) by mouth every 6 (six) hours as needed. What changed:  when to take this  reasons to take this   tiZANidine 2 MG tablet Commonly known as:  ZANAFLEX Take 1 tablet (2 mg total) by mouth every 6 (six) hours as needed for muscle spasms.   traZODone 100 MG tablet Commonly known as:  DESYREL TAKE 1 TABLET AT BEDTIME       Diagnostic Studies: Dg Chest 2 View  Result Date: 08/22/2016 CLINICAL DATA:  70 year old female preoperative study. Initial encounter. EXAM: CHEST  2 VIEW COMPARISON:  None. FINDINGS: Exaggerated thoracic kyphosis and  mildly to moderately increased AP dimension to the chest. Curvilinear probable scarring in the superior lingula. No pneumothorax, pulmonary edema, pleural effusion or other confluent pulmonary opacity. Normal cardiac size and mediastinal contours. Visualized tracheal air column is within normal limits. Cholecystectomy clips in the right upper quadrant. No acute osseous abnormality identified. IMPRESSION: No acute cardiopulmonary abnormality.  Lingula scarring suspected. Electronically Signed   By: Genevie Ann M.D.   On: 08/22/2016 15:41   Mm Digital Screening Bilateral  Result Date: 09/01/2016 CLINICAL DATA:  Screening. EXAM: DIGITAL SCREENING BILATERAL MAMMOGRAM WITH CAD COMPARISON:  Previous exam(s). ACR Breast Density Category b: There are scattered areas of fibroglandular density. FINDINGS: There are no findings suspicious for malignancy. Images were processed with CAD. IMPRESSION: No mammographic evidence of malignancy. A result letter of this screening mammogram will be mailed directly to the patient. RECOMMENDATION: Screening mammogram in one year. (Code:SM-B-01Y) BI-RADS CATEGORY  1: Negative. Electronically Signed   By: Dorise Bullion III M.D   On: 09/01/2016 07:27   Dg C-arm 1-60 Min  Result Date: 08/31/2016 CLINICAL DATA:  Anterior approach right total hip arthroplasty. Fluoro time reported 27 seconds EXAM: OPERATIVE right HIP (WITH PELVIS IF PERFORMED) 2 VIEWS TECHNIQUE: Fluoroscopic spot image(s) were submitted for interpretation post-operatively. COMPARISON:  None in PACs FINDINGS: Two fluoro spot images reveal the patient to of undergone total right hip joint prosthesis placement. Radiographic positioning of the prosthetic device is good. The interface with the native bone appears normal. IMPRESSION: There is no acute postprocedure complication following anterior approach right total hip joint replacement. Electronically Signed   By: David  Martinique M.D.   On: 08/31/2016 15:15   Dg Hip  Operative Unilat With Pelvis Right  Result Date: 08/31/2016 CLINICAL DATA:  Anterior approach right total hip arthroplasty. Fluoro time reported 27 seconds EXAM: OPERATIVE right HIP (WITH PELVIS IF PERFORMED) 2 VIEWS TECHNIQUE: Fluoroscopic spot image(s) were submitted for interpretation post-operatively. COMPARISON:  None in PACs FINDINGS: Two fluoro spot images reveal the patient to of undergone total right hip joint prosthesis placement. Radiographic positioning of the prosthetic device is good. The interface with the native bone appears normal. IMPRESSION: There is no acute postprocedure complication following anterior approach right total hip joint replacement. Electronically Signed   By: David  Martinique M.D.   On: 08/31/2016 15:15    Disposition: 01-Home or Self Care    Follow-up Information    Kerin Salen, MD Follow up in 2 week(s).   Specialty:  Orthopedic Surgery Contact information: [place] [address] [place] .   Specialty:   Why:  Home Health Physical Therapy Contact information: [place] [id number] [phone]  Signed: Rynlee Lisbon R 09/02/2016, 7:34 AM

## 2016-09-02 NOTE — Progress Notes (Addendum)
Occupational Therapy Treatment Patient Details Name: Katherine Nixon MRN: [id number] DOB: [birth year] Today's Date: 09/02/2016    History of present illness 70 y.o. female now s/p direct anterior Rt THA. PMH: lupus, HTN, depression, anxiety, Lt shoulder surgery.   OT comments  PTA pt mod assist with LB ADL, and used assistive devices for ambulation. Pt currently mod assist with LB ADL but is able to use AE to maximize independence. Pt currently min guard for ambulation with RW. PTA Pt used long handle shoe horn, and OT educated on grabber/reacher and long handle sponge. Pt at appropriate level to d/c home with HHOT and 24 hour supervision/assistance.   Follow Up Recommendations  No OT follow up;Supervision/Assistance - 24 hour    Equipment Recommendations  Other (comment);3 in 1 bedside comode (grabber/reacher)    Recommendations for Other Services      Precautions / Restrictions Precautions Precautions: Fall Restrictions Weight Bearing Restrictions: Yes RLE Weight Bearing: Weight bearing as tolerated       Mobility Bed Mobility               General bed mobility comments: Pt OOB in recliner when OT entered the room.  Transfers Overall transfer level: Needs assistance Equipment used: Rolling walker (2 wheeled) Transfers: Sit to/from Stand Sit to Stand: Min assist         General transfer comment: Pt with safe hand placement    Balance Overall balance assessment: Needs assistance Sitting-balance support: No upper extremity supported;Feet supported Sitting balance-Leahy Scale: Good     Standing balance support: No upper extremity supported;During functional activity Standing balance-Leahy Scale: Fair Standing balance comment: Pt able to perform BUE tasks at sink level for grooming tasks with no LOB                   ADL Overall ADL's : Needs assistance/impaired     Grooming: Wash/dry hands;Wash/dry face;Oral care;Supervision/safety;Standing Grooming  Details (indicate cue type and reason): sink level     Lower Body Bathing: Set up;Sit to/from stand Lower Body Bathing Details (indicate cue type and reason): Pt educated in long handle sponge, Pt stated that she would prefer to use washcloths on the floor Upper Body Dressing : Min guard;Sitting Upper Body Dressing Details (indicate cue type and reason): bath robe Lower Body Dressing: Moderate assistance;With caregiver independent assisting;With adaptive equipment;Sit to/from stand Lower Body Dressing Details (indicate cue type and reason): Pt educated in [employer] fast streamer. Pt uses long handle shoe horn at baseline for LB dressing, and assist from husband Toilet Transfer: Min guard;Ambulation;BSC;RW (BSC over toilet in bathroom)   Toileting- Clothing Manipulation and Hygiene: Min guard   Tub/ Shower Transfer: Walk-in shower;Ambulation;3 in 1;Rolling walker Tub/Shower Transfer Details (indicate cue type and reason): Pt educated on entering shower backwards with RW for support, and strong side in shower first. Pt aware it is necessary to have caregiver there for safety at first Functional mobility during ADLs: Min guard;Rolling walker (for safety) General ADL Comments: Pt able to perform sink level ADL and open to AE education to increase independence in self care tasks.       Vision                     Perception     Praxis      Cognition   Behavior During Therapy: [facility] for tasks assessed/performed Overall Cognitive Status: Within Functional Limits for tasks assessed  Extremity/Trunk Assessment               Exercises     Shoulder Instructions       General Comments      Pertinent Vitals/ Pain       Pain Assessment: 0-10 Pain Score: 2  Faces Pain Scale: Hurts even more Pain Location: Right Hip Pain Descriptors / Indicators: Sore Pain Intervention(s): Monitored during session;RN gave pain meds during session  Home Living                                           Prior Functioning/Environment              Frequency  Min 2X/week        Progress Toward Goals  OT Goals(current goals can now be found in the care plan section)  Progress towards OT goals: Progressing toward goals  Acute Rehab OT Goals Patient Stated Goal: go home with her husband after the hospital.  OT Goal Formulation: With patient Time For Goal Achievement: 09/15/16 Potential to Achieve Goals: Good  Plan Discharge plan updated   Co-evaluation                 End of Session Equipment Utilized During Treatment: Rolling walker   Activity Tolerance Patient tolerated treatment well   Patient Left in chair;with call bell/phone within reach;with nursing/sitter in room   Nurse Communication Patient requests pain meds;Other (comment) (in room administering pain medications)        Time: [id number] OT Time Calculation (min): 33 min  Charges: OT General Charges $OT Visit: 1 Procedure OT Treatments $Self Care/Home Management : 23-37 mins  Katherine Nixon 09/02/2016, 9:01 AM  Katherine Nixon OTR/L [phone]   Addendum: Discharge plan updated to No OT follow up

## 2016-09-02 NOTE — Progress Notes (Signed)
PATIENT ID: Katherine Nixon  MRN: [id number]  DOB/AGE:  1946-01-31 / 70 y.o.  2 Days Post-Op Procedure(s) (LRB): TOTAL HIP ARTHROPLASTY ANTERIOR APPROACH (Right)    PROGRESS NOTE Subjective: Patient is alert, oriented, no Nausea, no Vomiting, yes passing gas, . Taking PO well. Denies SOB, Chest or Calf Pain. Using Incentive Spirometer, PAS in place. Ambulate WBAT with pt walking 55 ft with therapy Patient reports pain as moderate .    Objective: Vital signs in last 24 hours: Vitals:   09/01/16 0900 09/01/16 1500 09/01/16 2021 09/02/16 0427  BP:  (!) 132/52 (!) 120/43 (!) 110/46  Pulse:  78 67 65  Resp:  17 16 16   Temp: 99.5 F (37.5 C) 98.4 F (36.9 C) 98.3 F (36.8 C) 98 F (36.7 C)  TempSrc:  Oral Oral Oral  SpO2:  90% 93% 95%  Weight:      Height:          Intake/Output from previous day: I/O last 3 completed shifts: In: 1990.4 [P.O.:1080; I.V.:910.4] Out: 1200 [Urine:1200]   Intake/Output this shift: No intake/output data recorded.   LABORATORY DATA:  Recent Labs  09/01/16 0405 09/02/16 0645  WBC 6.3 7.1  HGB 9.6* 8.9*  HCT 30.3* 28.1*  PLT 174 140*    Examination: Neurologically intact Neurovascular intact Sensation intact distally Intact pulses distally Dorsiflexion/Plantar flexion intact Incision: dressing C/D/I No cellulitis present Compartment soft} XR AP&Lat of hip shows well placed\fixed THA  Assessment:   2 Days Post-Op Procedure(s) (LRB): TOTAL HIP ARTHROPLASTY ANTERIOR APPROACH (Right) ADDITIONAL DIAGNOSIS:  Expected Acute Blood Loss Anemia, HTN and lupus  Plan: PT/OT WBAT, THA  DVT Prophylaxis: SCDx72 hrs, AEliquis 2.5 mg BID x 2 weeks  DISCHARGE PLAN: Home  DISCHARGE NEEDS: HHPT, Walker and 3-in-1 comode seat

## 2016-09-02 NOTE — Progress Notes (Signed)
Discharge Note:  Patient alert and oriented and in no distress.  Patient given discharge instructions regarding sings and symptoms to report, medication, diet, activity, and upcoming appointments.  She verbalized understanding of all instructions.  Peripheral IV discontinued.  Patient's spouse and granddaughter will assist her in collecting all of her personal belongings.  Patient awaiting another physical therapy session and assistance with bathing and dressing before she is transported out via wheelchair by hospital staff.

## 2016-09-02 NOTE — Progress Notes (Signed)
Physical Therapy Progress Note  Patient is making good progress with PT.  From a mobility standpoint anticipate patient will be ready for DC home with family support. Patient denies any questions or concerns.     09/02/16 1245  PT Visit Information  Last PT Received On 09/02/16  Assistance Needed +1  History of Present Illness 70 y.o. female now s/p direct anterior Rt THA. PMH: lupus, HTN, depression, anxiety, Lt shoulder surgery.  Subjective Data  Subjective Should be going home soon.   Patient Stated Goal get home  Precautions  Precautions Fall  Restrictions  Weight Bearing Restrictions Yes  RLE Weight Bearing WBAT  Pain Assessment  Pain Assessment Faces  Faces Pain Scale 4  Pain Location Rt hip  Pain Descriptors / Indicators Sore;Aching  Pain Intervention(s) Limited activity within patient's tolerance;Monitored during session  Cognition  Arousal/Alertness Awake/alert  Behavior During Therapy WFL for tasks assessed/performed  Overall Cognitive Status Within Functional Limits for tasks assessed  Bed Mobility  Overal bed mobility Needs Assistance  Bed Mobility Supine to Sit;Sit to Supine  Supine to sit Min assist  Sit to supine Min assist  General bed mobility comments assist provided with LEs for bed mobility. Encouraging pt to use UEs to push up to sitting.   Transfers  Overall transfer level Needs assistance  Equipment used Rolling walker (2 wheeled)  Transfers Sit to/from Stand  Sit to Stand Supervision  General transfer comment good stability  Ambulation/Gait  Ambulation/Gait assistance Supervision  Ambulation Distance (Feet) 200 Feet  Assistive device Rolling walker (2 wheeled)  Gait Pattern/deviations Step-to pattern  General Gait Details even strides, no loss of balance  Gait velocity WFL  Stairs Yes  Stairs assistance Min guard  Stair Management Forwards;One rail Left;Step to pattern  Number of Stairs 5  General stair comments Pt with good stability,  reports feeling confident with ability to perform at home. Family observing session.   Balance  Overall balance assessment Needs assistance  Sitting-balance support No upper extremity supported  Sitting balance-Leahy Scale Good  Standing balance support During functional activity  Standing balance-Leahy Scale Fair  PT - End of Session  Equipment Utilized During Treatment Gait belt  Activity Tolerance Patient tolerated treatment well  Patient left in bed;with call bell/phone within reach  Nurse Communication Mobility status;Weight bearing status  PT - Assessment/Plan  PT Plan Current plan remains appropriate  PT Frequency (ACUTE ONLY) 7X/week  Follow Up Recommendations Home health PT;Supervision for mobility/OOB  PT equipment None recommended by PT  PT Goal Progression  Progress towards PT goals Progressing toward goals  Acute Rehab PT Goals  PT Goal Formulation With patient  Time For Goal Achievement 09/15/16  Potential to Achieve Goals Good  PT Time Calculation  PT Start Time (ACUTE ONLY) 1223  PT Stop Time (ACUTE ONLY) 1244  PT Time Calculation (min) (ACUTE ONLY) 21 min  PT General Charges  $$ ACUTE PT VISIT 1 Procedure  PT Treatments  $Gait Training 8-22 mins  Cassell Clement, PT, CSCS Pager [phone] Office [phone]

## 2016-09-02 NOTE — Progress Notes (Signed)
Physical Therapy Treatment Patient Details Name: Katherine Nixon MRN: [id number] DOB: 12-23-1945 Today's Date: 09/02/2016    History of Present Illness 69 y.o. female now s/p direct anterior Rt THA. PMH: lupus, HTN, depression, anxiety, Lt shoulder surgery.    PT Comments    Pt making great progress with PT, able to ambulate 400 ft with rw during PT session. Will check back to attempt stairs prior to D/C and answer any questions or concerns. Pt requesting to return to bed to rest following session.   Follow Up Recommendations  Home health PT;Supervision for mobility/OOB     Equipment Recommendations  None recommended by PT    Recommendations for Other Services       Precautions / Restrictions Precautions Precautions: Fall Restrictions Weight Bearing Restrictions: Yes RLE Weight Bearing: Weight bearing as tolerated    Mobility  Bed Mobility Overal bed mobility: Needs Assistance Bed Mobility: Sit to Supine       Sit to supine: Mod assist   General bed mobility comments: mod assist with LEs to get into bed.   Transfers Overall transfer level: Needs assistance Equipment used: Rolling walker (2 wheeled) Transfers: Sit to/from Stand Sit to Stand: Min guard         General transfer comment: Pt with safe hand placement  Ambulation/Gait Ambulation/Gait assistance: Supervision Ambulation Distance (Feet): 400 Feet Assistive device: Rolling walker (2 wheeled) Gait Pattern/deviations: Step-through pattern Gait velocity: decreased   General Gait Details: even strides, no loss of balance   Stairs            Wheelchair Mobility    Modified Rankin (Stroke Patients Only)       Balance Overall balance assessment: Needs assistance Sitting-balance support: No upper extremity supported Sitting balance-Leahy Scale: Good     Standing balance support: During functional activity Standing balance-Leahy Scale: Fair Standing balance comment: Pt able to perform  BUE tasks at sink level for grooming tasks with no LOB                    Cognition Arousal/Alertness: Awake/alert Behavior During Therapy: WFL for tasks assessed/performed Overall Cognitive Status: Within Functional Limits for tasks assessed                      Exercises Total Joint Exercises Ankle Circles/Pumps: AROM;Both;15 reps Quad Sets: Strengthening;10 reps;Right Short Arc Quad: Strengthening;Right;10 reps Heel Slides: AAROM;10 reps;Right Hip ABduction/ADduction: Strengthening;Right;10 reps (min assist)    General Comments        Pertinent Vitals/Pain Pain Assessment: 0-10 Pain Score: 3  Faces Pain Scale: Hurts even more Pain Location: Rt hip Pain Descriptors / Indicators: Sore Pain Intervention(s): Limited activity within patient's tolerance;Monitored during session    Home Living                      Prior Function            PT Goals (current goals can now be found in the care plan section) Acute Rehab PT Goals Patient Stated Goal: get home PT Goal Formulation: With patient Time For Goal Achievement: 09/15/16 Potential to Achieve Goals: Good Progress towards PT goals: Progressing toward goals    Frequency    7X/week      PT Plan Current plan remains appropriate    Co-evaluation             End of Session Equipment Utilized During Treatment: Gait belt Activity Tolerance: Patient tolerated treatment well Patient  left: in bed;with call bell/phone within reach     Time: [id number] PT Time Calculation (min) (ACUTE ONLY): 27 min  Charges:  $Gait Training: 8-22 mins $Therapeutic Exercise: 8-22 mins                    G Codes:      Cassell Clement, PT, CSCS Pager [phone] Office [phone]  09/02/2016, 11:29 AM

## 2016-09-03 DIAGNOSIS — G894 Chronic pain syndrome: Secondary | ICD-10-CM | POA: Diagnosis not present

## 2016-09-03 DIAGNOSIS — M1612 Unilateral primary osteoarthritis, left hip: Secondary | ICD-10-CM | POA: Diagnosis not present

## 2016-09-03 DIAGNOSIS — Z87891 Personal history of nicotine dependence: Secondary | ICD-10-CM | POA: Diagnosis not present

## 2016-09-03 DIAGNOSIS — K5904 Chronic idiopathic constipation: Secondary | ICD-10-CM | POA: Diagnosis not present

## 2016-09-03 DIAGNOSIS — I10 Essential (primary) hypertension: Secondary | ICD-10-CM | POA: Diagnosis not present

## 2016-09-03 DIAGNOSIS — M5136 Other intervertebral disc degeneration, lumbar region: Secondary | ICD-10-CM | POA: Diagnosis not present

## 2016-09-03 DIAGNOSIS — Z96641 Presence of right artificial hip joint: Secondary | ICD-10-CM | POA: Diagnosis not present

## 2016-09-03 DIAGNOSIS — M170 Bilateral primary osteoarthritis of knee: Secondary | ICD-10-CM | POA: Diagnosis not present

## 2016-09-03 DIAGNOSIS — E785 Hyperlipidemia, unspecified: Secondary | ICD-10-CM | POA: Diagnosis not present

## 2016-09-03 DIAGNOSIS — I872 Venous insufficiency (chronic) (peripheral): Secondary | ICD-10-CM | POA: Diagnosis not present

## 2016-09-03 DIAGNOSIS — Z471 Aftercare following joint replacement surgery: Secondary | ICD-10-CM | POA: Diagnosis not present

## 2016-09-03 DIAGNOSIS — M329 Systemic lupus erythematosus, unspecified: Secondary | ICD-10-CM | POA: Diagnosis not present

## 2016-09-05 DIAGNOSIS — M5136 Other intervertebral disc degeneration, lumbar region: Secondary | ICD-10-CM | POA: Diagnosis not present

## 2016-09-05 DIAGNOSIS — Z471 Aftercare following joint replacement surgery: Secondary | ICD-10-CM | POA: Diagnosis not present

## 2016-09-05 DIAGNOSIS — M1612 Unilateral primary osteoarthritis, left hip: Secondary | ICD-10-CM | POA: Diagnosis not present

## 2016-09-05 DIAGNOSIS — M170 Bilateral primary osteoarthritis of knee: Secondary | ICD-10-CM | POA: Diagnosis not present

## 2016-09-05 DIAGNOSIS — K5904 Chronic idiopathic constipation: Secondary | ICD-10-CM | POA: Diagnosis not present

## 2016-09-05 DIAGNOSIS — G894 Chronic pain syndrome: Secondary | ICD-10-CM | POA: Diagnosis not present

## 2016-09-07 DIAGNOSIS — M1612 Unilateral primary osteoarthritis, left hip: Secondary | ICD-10-CM | POA: Diagnosis not present

## 2016-09-07 DIAGNOSIS — M5136 Other intervertebral disc degeneration, lumbar region: Secondary | ICD-10-CM | POA: Diagnosis not present

## 2016-09-07 DIAGNOSIS — M170 Bilateral primary osteoarthritis of knee: Secondary | ICD-10-CM | POA: Diagnosis not present

## 2016-09-07 DIAGNOSIS — K5904 Chronic idiopathic constipation: Secondary | ICD-10-CM | POA: Diagnosis not present

## 2016-09-07 DIAGNOSIS — G894 Chronic pain syndrome: Secondary | ICD-10-CM | POA: Diagnosis not present

## 2016-09-07 DIAGNOSIS — Z471 Aftercare following joint replacement surgery: Secondary | ICD-10-CM | POA: Diagnosis not present

## 2016-09-09 DIAGNOSIS — Z471 Aftercare following joint replacement surgery: Secondary | ICD-10-CM | POA: Diagnosis not present

## 2016-09-09 DIAGNOSIS — K5904 Chronic idiopathic constipation: Secondary | ICD-10-CM | POA: Diagnosis not present

## 2016-09-09 DIAGNOSIS — M5136 Other intervertebral disc degeneration, lumbar region: Secondary | ICD-10-CM | POA: Diagnosis not present

## 2016-09-09 DIAGNOSIS — M1612 Unilateral primary osteoarthritis, left hip: Secondary | ICD-10-CM | POA: Diagnosis not present

## 2016-09-09 DIAGNOSIS — M170 Bilateral primary osteoarthritis of knee: Secondary | ICD-10-CM | POA: Diagnosis not present

## 2016-09-09 DIAGNOSIS — G894 Chronic pain syndrome: Secondary | ICD-10-CM | POA: Diagnosis not present

## 2016-09-12 DIAGNOSIS — M5136 Other intervertebral disc degeneration, lumbar region: Secondary | ICD-10-CM | POA: Diagnosis not present

## 2016-09-12 DIAGNOSIS — K5904 Chronic idiopathic constipation: Secondary | ICD-10-CM | POA: Diagnosis not present

## 2016-09-12 DIAGNOSIS — G894 Chronic pain syndrome: Secondary | ICD-10-CM | POA: Diagnosis not present

## 2016-09-12 DIAGNOSIS — M1612 Unilateral primary osteoarthritis, left hip: Secondary | ICD-10-CM | POA: Diagnosis not present

## 2016-09-12 DIAGNOSIS — Z471 Aftercare following joint replacement surgery: Secondary | ICD-10-CM | POA: Diagnosis not present

## 2016-09-12 DIAGNOSIS — M170 Bilateral primary osteoarthritis of knee: Secondary | ICD-10-CM | POA: Diagnosis not present

## 2016-09-13 DIAGNOSIS — Z96641 Presence of right artificial hip joint: Secondary | ICD-10-CM | POA: Diagnosis not present

## 2016-09-13 DIAGNOSIS — R262 Difficulty in walking, not elsewhere classified: Secondary | ICD-10-CM | POA: Diagnosis not present

## 2016-09-13 DIAGNOSIS — M1611 Unilateral primary osteoarthritis, right hip: Secondary | ICD-10-CM | POA: Diagnosis not present

## 2016-09-13 DIAGNOSIS — Z471 Aftercare following joint replacement surgery: Secondary | ICD-10-CM | POA: Diagnosis not present

## 2016-09-13 DIAGNOSIS — M25551 Pain in right hip: Secondary | ICD-10-CM | POA: Diagnosis not present

## 2016-09-19 DIAGNOSIS — M533 Sacrococcygeal disorders, not elsewhere classified: Secondary | ICD-10-CM | POA: Diagnosis not present

## 2016-09-19 DIAGNOSIS — Z96641 Presence of right artificial hip joint: Secondary | ICD-10-CM | POA: Diagnosis not present

## 2016-09-19 DIAGNOSIS — R262 Difficulty in walking, not elsewhere classified: Secondary | ICD-10-CM | POA: Diagnosis not present

## 2016-09-19 DIAGNOSIS — M25551 Pain in right hip: Secondary | ICD-10-CM | POA: Diagnosis not present

## 2016-09-21 DIAGNOSIS — Z96641 Presence of right artificial hip joint: Secondary | ICD-10-CM | POA: Diagnosis not present

## 2016-09-21 DIAGNOSIS — R262 Difficulty in walking, not elsewhere classified: Secondary | ICD-10-CM | POA: Diagnosis not present

## 2016-09-21 DIAGNOSIS — M25551 Pain in right hip: Secondary | ICD-10-CM | POA: Diagnosis not present

## 2016-09-26 DIAGNOSIS — R262 Difficulty in walking, not elsewhere classified: Secondary | ICD-10-CM | POA: Diagnosis not present

## 2016-09-26 DIAGNOSIS — Z96641 Presence of right artificial hip joint: Secondary | ICD-10-CM | POA: Diagnosis not present

## 2016-09-26 DIAGNOSIS — M25551 Pain in right hip: Secondary | ICD-10-CM | POA: Diagnosis not present

## 2016-10-06 DIAGNOSIS — M533 Sacrococcygeal disorders, not elsewhere classified: Secondary | ICD-10-CM | POA: Diagnosis not present

## 2016-10-11 DIAGNOSIS — M1711 Unilateral primary osteoarthritis, right knee: Secondary | ICD-10-CM | POA: Diagnosis not present

## 2016-10-19 DIAGNOSIS — M25551 Pain in right hip: Secondary | ICD-10-CM | POA: Diagnosis not present

## 2016-10-19 DIAGNOSIS — R262 Difficulty in walking, not elsewhere classified: Secondary | ICD-10-CM | POA: Diagnosis not present

## 2016-10-19 DIAGNOSIS — Z96641 Presence of right artificial hip joint: Secondary | ICD-10-CM | POA: Diagnosis not present

## 2016-10-22 ENCOUNTER — Other Ambulatory Visit: Payer: Self-pay | Admitting: Family Medicine

## 2016-10-24 DIAGNOSIS — M1710 Unilateral primary osteoarthritis, unspecified knee: Secondary | ICD-10-CM | POA: Diagnosis not present

## 2016-10-24 DIAGNOSIS — G479 Sleep disorder, unspecified: Secondary | ICD-10-CM | POA: Diagnosis not present

## 2016-10-24 DIAGNOSIS — M5136 Other intervertebral disc degeneration, lumbar region: Secondary | ICD-10-CM | POA: Diagnosis not present

## 2016-10-24 DIAGNOSIS — R682 Dry mouth, unspecified: Secondary | ICD-10-CM | POA: Diagnosis not present

## 2016-10-24 DIAGNOSIS — M25519 Pain in unspecified shoulder: Secondary | ICD-10-CM | POA: Diagnosis not present

## 2016-10-24 DIAGNOSIS — R768 Other specified abnormal immunological findings in serum: Secondary | ICD-10-CM | POA: Diagnosis not present

## 2016-10-24 LAB — HEPATIC FUNCTION PANEL: ALK PHOS: 137 U/L — AB (ref 25–125)

## 2016-10-29 ENCOUNTER — Other Ambulatory Visit: Payer: Self-pay | Admitting: Family Medicine

## 2016-12-01 ENCOUNTER — Other Ambulatory Visit: Payer: Self-pay | Admitting: Family Medicine

## 2016-12-02 ENCOUNTER — Other Ambulatory Visit: Payer: Self-pay | Admitting: Sports Medicine

## 2017-01-05 ENCOUNTER — Other Ambulatory Visit: Payer: Self-pay | Admitting: Family Medicine

## 2017-01-11 ENCOUNTER — Ambulatory Visit (INDEPENDENT_AMBULATORY_CARE_PROVIDER_SITE_OTHER): Payer: Medicare Other | Admitting: Family Medicine

## 2017-01-11 ENCOUNTER — Encounter: Payer: Self-pay | Admitting: Family Medicine

## 2017-01-11 VITALS — BP 139/56 | HR 73 | Ht 65.0 in | Wt 186.0 lb

## 2017-01-11 DIAGNOSIS — E559 Vitamin D deficiency, unspecified: Secondary | ICD-10-CM

## 2017-01-11 DIAGNOSIS — M21619 Bunion of unspecified foot: Secondary | ICD-10-CM

## 2017-01-11 DIAGNOSIS — R7989 Other specified abnormal findings of blood chemistry: Secondary | ICD-10-CM

## 2017-01-11 DIAGNOSIS — K5901 Slow transit constipation: Secondary | ICD-10-CM

## 2017-01-11 DIAGNOSIS — R748 Abnormal levels of other serum enzymes: Secondary | ICD-10-CM

## 2017-01-11 DIAGNOSIS — I10 Essential (primary) hypertension: Secondary | ICD-10-CM

## 2017-01-11 DIAGNOSIS — E785 Hyperlipidemia, unspecified: Secondary | ICD-10-CM

## 2017-01-11 NOTE — Patient Instructions (Signed)
Go for labs in end April for your cholesterol and vitamin D.

## 2017-01-11 NOTE — Progress Notes (Addendum)
Subjective:    CC: HTN  HPI: Hypertension- Pt denies chest pain, SOB, dizziness, or heart palpitations.  Taking meds as directed w/o problems.  Denies medication side effects.    Elevated alkaline phosphatase- She had some blood work done at [facility] and brought in a copy of her CMP. Alkaline phosphatase was still mildly elevated at 137. We will get these labs entered into the system.  Vitamin D deficiency- says she is taking Vit D 1000 iu daily.   Had flu vaccine done at [facility].    She had hip replacement, right side in October and required blood transfusion.She is doing well and says that she's recovered well. She is back to her normal activities. She is now having some knee problems and so is hoping to actually have surgery on that probably in October.  Would like request to see a podiatrist. She has a bunion and also has a sensation of critical paper under her toes. She saw a podiatrist several years ago here locally.  She is concerned she is gaining weight.  She did have a history replacement in October and now having palms with her knee. She will likely end up having a knee replacement done in the summer so she hasn't been very active.  Sill having diarrhea around her constipation. Hasn't been back to GI. Tried multiple things in the past including Dulcolax, MiraLAX which is she is still currently taking, and an magnesium citrate. She still feels constipated but yet at times will have watery stools. She is not followed back up with GI.  Pain in her left shoulder. She had surgery in the shoulder and says she plans on getting back in with her orthopedist may be having injection but it's been bothering her enough that it's actually been waking her up at night.   Past medical history, Surgical history, Family history not pertinant except as noted below, Social history, Allergies, and medications have been entered into the medical record, reviewed, and corrections made.   Review of Systems:  No fevers, chills, night sweats, weight loss, chest pain, or shortness of breath.   Objective:    General: Well Developed, well nourished, and in no acute distress.  Neuro: Alert and oriented x3, extra-ocular muscles intact, sensation grossly intact.  HEENT: Normocephalic, atraumatic  Skin: Warm and dry, no rashes. Cardiac: Regular rate and rhythm, no murmurs rubs or gallops, no lower extremity edema.  Respiratory: Clear to auscultation bilaterally. Not using accessory muscles, speaking in full sentences.   Impression and Recommendations:    HTN - Well controlled. Continue current regimen. Follow up in  6 months.  Vit D def - Due to recheck level. Can check in April.   Elevated alkaline phosphatase- repeat level was eelvated at [facility]. Will get labs entered into the sytem.    Hyperlipidemia - due to recheck in April. Labs slip printed today.   Constipation with diarrhea - curvature to see her GI again and discuss this. Offered to get a KUB today just to see if she still had a lot of fullness or stool but she declined.  Bunion/foot pain-we'll refer to podiatry.  She declined to have a bone density test done today her last one was in 2008.

## 2017-01-16 ENCOUNTER — Encounter: Payer: Self-pay | Admitting: Family Medicine

## 2017-01-23 ENCOUNTER — Ambulatory Visit (INDEPENDENT_AMBULATORY_CARE_PROVIDER_SITE_OTHER): Payer: Medicare Other | Admitting: Podiatry

## 2017-01-23 ENCOUNTER — Other Ambulatory Visit: Payer: Self-pay | Admitting: Family Medicine

## 2017-01-23 ENCOUNTER — Encounter: Payer: Self-pay | Admitting: Podiatry

## 2017-01-23 VITALS — BP 141/63 | HR 75 | Ht 65.0 in | Wt 182.0 lb

## 2017-01-23 DIAGNOSIS — M79672 Pain in left foot: Secondary | ICD-10-CM

## 2017-01-23 DIAGNOSIS — M21619 Bunion of unspecified foot: Secondary | ICD-10-CM

## 2017-01-23 NOTE — Patient Instructions (Signed)
Seen for painful bunion. Reviewed findings and available treatment options. Pre op consent form reviewed for McBrided bunionectomy left foot.

## 2017-01-23 NOTE — Progress Notes (Signed)
SUBJECTIVE: 71 y.o. year old female presents complaining of numbness under the toes. Also has a growth under big toe, bunion on left foot preventing her from wearing closed in shoes duration of a year. Bunion bothers her when she is on her feet with closed in shoes. 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 Stated that she has very sensitive skin due to Lupus. She will also have both knee surgery this Summer.   REVIEW OF SYSTEMS: A comprehensive review of systems was negative except for: Lupus, joint pain in hip and knee. Can take Oxycodone only without Tylenol.  OBJECTIVE: DERMATOLOGIC EXAMINATION: Normal findings except superficial varicose veins.   VASCULAR EXAMINATION OF LOWER LIMBS: All pedal pulses are palpable with normal pulsation.  Capillary Filling times within 3 seconds in all digits.  Superficial varicose veins bilateral. Temperature gradient from tibial crest to dorsum of foot is within normal bilateral.  NEUROLOGIC EXAMINATION OF THE LOWER LIMBS: All epicritic and tactile sensations grossly intact.   MUSCULOSKELETAL EXAMINATION: Positive for thinning of plantar fat pads under the metatarsophalangeal joints bilateral. Enlarged bunion left foot. Contracted lesser digits 3-5 bilateral.  ASSESSMENT: Painful bunion left. Thinning of plantar fat pad under ball of both feet. Digital contracture lesser digits bilateral.   PLAN: Reviewed findings and available treatment options, shoes with extra padding, change shoe gear, avoid barefoot. . As per request, surgery consent form reviewed for McBride bunionectomy.

## 2017-02-09 ENCOUNTER — Ambulatory Visit: Payer: Medicare Other

## 2017-02-28 DIAGNOSIS — M1711 Unilateral primary osteoarthritis, right knee: Secondary | ICD-10-CM | POA: Diagnosis not present

## 2017-02-28 DIAGNOSIS — M1712 Unilateral primary osteoarthritis, left knee: Secondary | ICD-10-CM | POA: Diagnosis not present

## 2017-02-28 DIAGNOSIS — M25551 Pain in right hip: Secondary | ICD-10-CM | POA: Diagnosis not present

## 2017-04-05 ENCOUNTER — Other Ambulatory Visit: Payer: Self-pay | Admitting: Family Medicine

## 2017-04-13 DIAGNOSIS — M1711 Unilateral primary osteoarthritis, right knee: Secondary | ICD-10-CM | POA: Diagnosis not present

## 2017-04-13 DIAGNOSIS — M1712 Unilateral primary osteoarthritis, left knee: Secondary | ICD-10-CM | POA: Diagnosis not present

## 2017-04-23 ENCOUNTER — Other Ambulatory Visit: Payer: Self-pay | Admitting: Family Medicine

## 2017-06-26 ENCOUNTER — Other Ambulatory Visit: Payer: Self-pay | Admitting: Family Medicine

## 2017-06-26 DIAGNOSIS — E785 Hyperlipidemia, unspecified: Secondary | ICD-10-CM | POA: Diagnosis not present

## 2017-06-26 DIAGNOSIS — E559 Vitamin D deficiency, unspecified: Secondary | ICD-10-CM | POA: Diagnosis not present

## 2017-06-26 LAB — LIPID PANEL W/REFLEX DIRECT LDL
CHOL/HDL RATIO: 1.8 ratio (ref <5.0)
CHOLESTEROL: 153 mg/dL (ref <200)
HDL: 84 mg/dL (ref >50)
LDL-Cholesterol: 55 mg/dL
Non-HDL Cholesterol (Calc): 69 mg/dL (ref <130)
Triglycerides: 49 mg/dL (ref <150)

## 2017-06-27 LAB — VITAMIN D 25 HYDROXY (VIT D DEFICIENCY, FRACTURES): VIT D 25 HYDROXY: 30 ng/mL (ref 30–100)

## 2017-06-30 ENCOUNTER — Other Ambulatory Visit: Payer: Self-pay | Admitting: Family Medicine

## 2017-07-04 ENCOUNTER — Other Ambulatory Visit: Payer: Self-pay | Admitting: Family Medicine

## 2017-07-13 ENCOUNTER — Encounter: Payer: Self-pay | Admitting: Family Medicine

## 2017-07-13 ENCOUNTER — Ambulatory Visit (INDEPENDENT_AMBULATORY_CARE_PROVIDER_SITE_OTHER): Payer: Medicare Other | Admitting: Family Medicine

## 2017-07-13 VITALS — BP 131/60 | HR 62 | Temp 98.2°F | Wt 195.0 lb

## 2017-07-13 DIAGNOSIS — E559 Vitamin D deficiency, unspecified: Secondary | ICD-10-CM | POA: Diagnosis not present

## 2017-07-13 DIAGNOSIS — R635 Abnormal weight gain: Secondary | ICD-10-CM

## 2017-07-13 DIAGNOSIS — R632 Polyphagia: Secondary | ICD-10-CM | POA: Diagnosis not present

## 2017-07-13 DIAGNOSIS — I10 Essential (primary) hypertension: Secondary | ICD-10-CM | POA: Diagnosis not present

## 2017-07-13 DIAGNOSIS — Z23 Encounter for immunization: Secondary | ICD-10-CM

## 2017-07-13 LAB — CBC WITH DIFFERENTIAL/PLATELET
BASOS ABS: 0 {cells}/uL (ref 0–200)
Basophils Relative: 0 %
EOS PCT: 2 %
Eosinophils Absolute: 126 cells/uL (ref 15–500)
HCT: 38.1 % (ref 35.0–45.0)
Hemoglobin: 12.4 g/dL (ref 11.7–15.5)
Lymphocytes Relative: 27 %
Lymphs Abs: 1701 cells/uL (ref 850–3900)
MCH: 30.7 pg (ref 27.0–33.0)
MCHC: 32.5 g/dL (ref 32.0–36.0)
MCV: 94.3 fL (ref 80.0–100.0)
MONOS PCT: 11 %
MPV: 9.2 fL (ref 7.5–12.5)
Monocytes Absolute: 693 cells/uL (ref 200–950)
NEUTROS PCT: 60 %
Neutro Abs: 3780 cells/uL (ref 1500–7800)
PLATELETS: 223 10*3/uL (ref 140–400)
RBC: 4.04 MIL/uL (ref 3.80–5.10)
RDW: 15.1 % — ABNORMAL HIGH (ref 11.0–15.0)
WBC: 6.3 10*3/uL (ref 3.8–10.8)

## 2017-07-13 MED ORDER — AMBULATORY NON FORMULARY MEDICATION: 0 refills | Status: DC

## 2017-07-13 NOTE — Progress Notes (Signed)
Subjective:    CC: BP HPI:  Hypertension- Pt denies chest pain, SOB, dizziness, or heart palpitations.  Taking meds as directed w/o problems.  Denies medication side effects.    Recent labs show that vitamin D was borderline lw. We incresed her dosig and plan to recheck in 2-3 months.she is already taking 2000 international units daily  She is concerned because she's been gaining weight over the last year or 2.more recently she's been trying to exercise on a stationary foot pedals. She says it really hasn't bothered her joints she tries to do a full hour per day 5 days per week. She's just noticed that she's more hungry than usual and just doesn't feel full when she eats.    Past medical history, Surgical history, Family history not pertinant except as noted below, Social history, Allergies, and medications have been entered into the medical record, reviewed, and corrections made.   Review of Systems: No fevers, chills, night sweats, weight loss, chest pain, or shortness of breath.   Objective:    General: Well Developed, well nourished, and in no acute distress.  Neuro: Alert and oriented x3, extra-ocular muscles intact, sensation grossly intact.  HEENT: Normocephalic, atraumatic  Skin: Warm and dry, no rashes. Cardiac: Regular rate and rhythm, no murmurs rubs or gallops, no lower extremity edema.  Respiratory: Clear to auscultation bilaterally. Not using accessory muscles, speaking in full sentences.   Impression and Recommendations:    HTN - Well controlled. Continue current regimen. Follow up in  6 months.   Abnormal weight gain-look for additional causes including thyroid disorder etc. I think she really needs to focus on portion control as well as regular exercise. I do not think this is because of her Cymbalta. She was started on that January 2016 her weight has been up and down ever since then.  Increased appetite-Unclear etiology. We can certainly evaluate for diabetes check  her thyroid.  Low Vitamin D - due to recheck next months. Encouraged her to increase her vitamin D to 4000 international units daily. It does come in over the-counter 5000 international units capsules so when she is out of her ottle she can get the 5000 international units capsules.

## 2017-07-14 LAB — TSH: TSH: 0.89 m[IU]/L

## 2017-07-14 LAB — HEMOGLOBIN A1C
Hgb A1c MFr Bld: 5.5 % (ref <5.7)
Mean Plasma Glucose: 111 mg/dL

## 2017-07-14 LAB — FERRITIN: Ferritin: 33 ng/mL (ref 20–288)

## 2017-07-17 ENCOUNTER — Other Ambulatory Visit: Payer: Self-pay | Admitting: Family Medicine

## 2017-07-22 ENCOUNTER — Other Ambulatory Visit: Payer: Self-pay | Admitting: Family Medicine

## 2017-08-08 ENCOUNTER — Other Ambulatory Visit: Payer: Self-pay | Admitting: Family Medicine

## 2017-08-08 DIAGNOSIS — Z0000 Encounter for general adult medical examination without abnormal findings: Secondary | ICD-10-CM

## 2017-08-08 DIAGNOSIS — Z1239 Encounter for other screening for malignant neoplasm of breast: Secondary | ICD-10-CM

## 2017-08-25 ENCOUNTER — Other Ambulatory Visit: Payer: Self-pay | Admitting: Family Medicine

## 2017-08-29 ENCOUNTER — Other Ambulatory Visit: Payer: Self-pay | Admitting: Sports Medicine

## 2017-08-30 ENCOUNTER — Ambulatory Visit (INDEPENDENT_AMBULATORY_CARE_PROVIDER_SITE_OTHER): Payer: Medicare Other

## 2017-08-30 DIAGNOSIS — Z1231 Encounter for screening mammogram for malignant neoplasm of breast: Secondary | ICD-10-CM | POA: Diagnosis not present

## 2017-08-30 DIAGNOSIS — Z1239 Encounter for other screening for malignant neoplasm of breast: Secondary | ICD-10-CM

## 2017-09-20 DIAGNOSIS — Z96641 Presence of right artificial hip joint: Secondary | ICD-10-CM | POA: Diagnosis not present

## 2017-09-20 DIAGNOSIS — Z09 Encounter for follow-up examination after completed treatment for conditions other than malignant neoplasm: Secondary | ICD-10-CM | POA: Diagnosis not present

## 2017-09-20 DIAGNOSIS — M1711 Unilateral primary osteoarthritis, right knee: Secondary | ICD-10-CM | POA: Diagnosis not present

## 2017-09-20 DIAGNOSIS — M25551 Pain in right hip: Secondary | ICD-10-CM | POA: Diagnosis not present

## 2017-09-25 IMAGING — RF DG HIP (WITH PELVIS) OPERATIVE*R*
1 series · 2 of 2 positions shown · non-contrast
Comparison: None in PACs

CLINICAL DATA: Anterior approach right total hip arthroplasty.
Fluoro time reported 27 seconds

EXAM:
OPERATIVE right HIP (WITH PELVIS IF PERFORMED) 2 VIEWS
TECHNIQUE: Fluoroscopic spot image(s) were submitted for interpretation
post-operatively.

[Series 1: run · 2 of 2 slices shown]
[im 1/2]
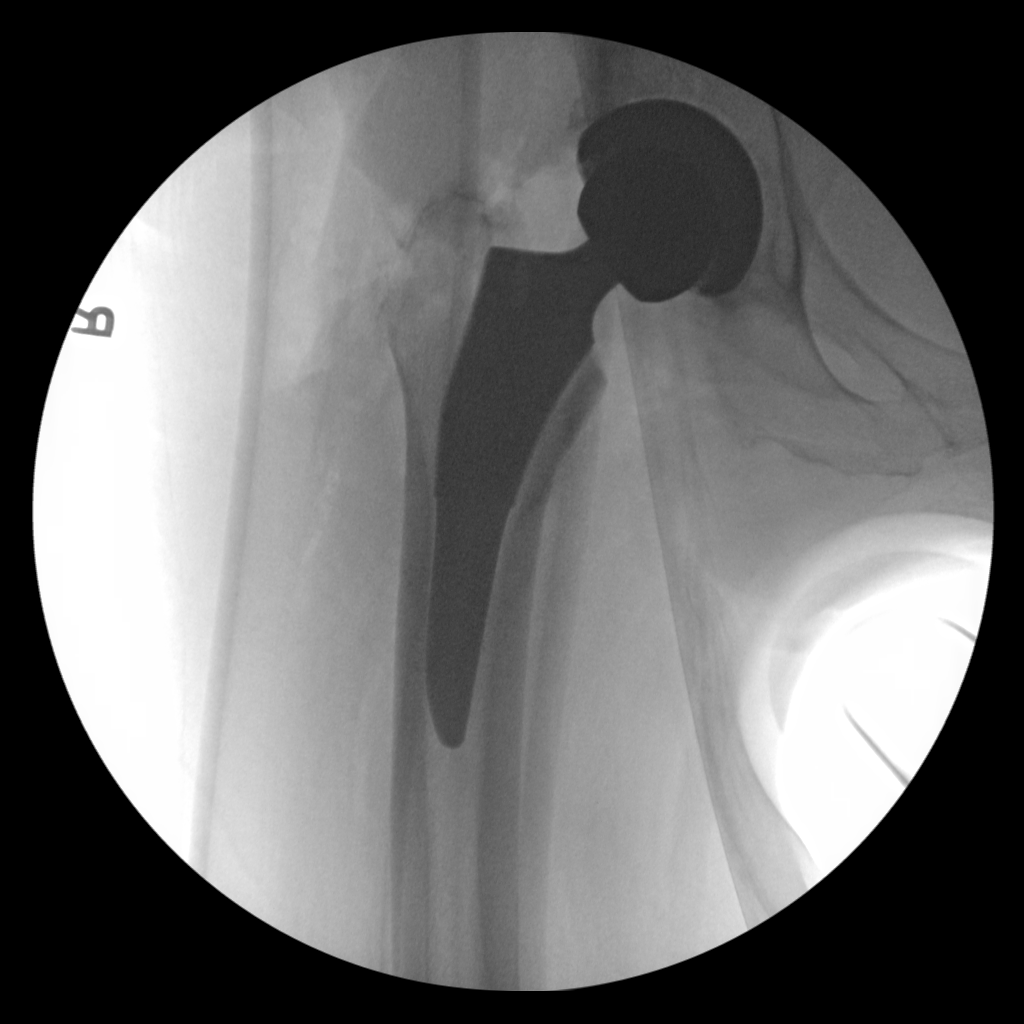
[im 2/2]
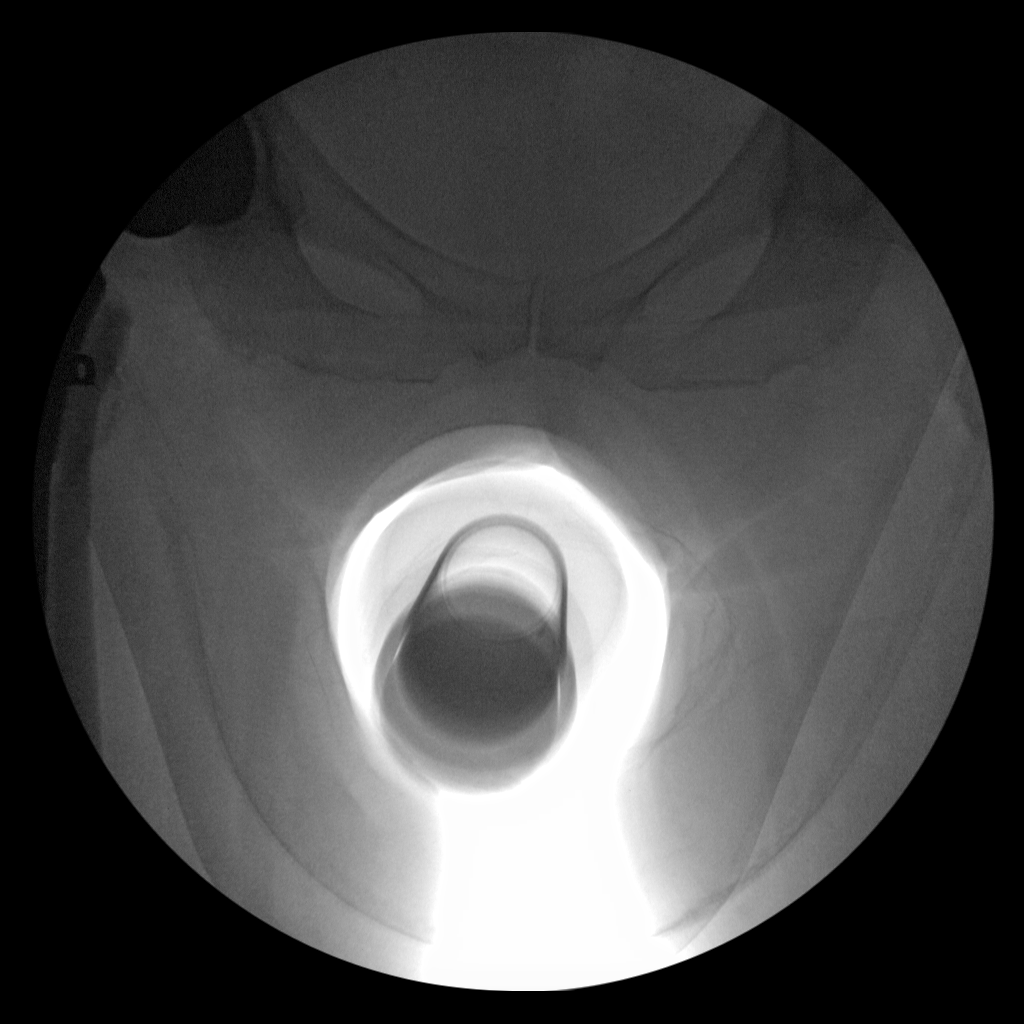

[2 of 2 positions shown; findings below may reference images not displayed]

FINDINGS: Two fluoro spot images reveal the patient to of undergone total
right hip joint prosthesis placement. Radiographic positioning of
the prosthetic device is good. The interface with the native bone
appears normal.
IMPRESSION: There is no acute postprocedure complication following anterior
approach right total hip joint replacement.

## 2017-10-21 ENCOUNTER — Other Ambulatory Visit: Payer: Self-pay | Admitting: Family Medicine

## 2017-12-15 ENCOUNTER — Other Ambulatory Visit: Payer: Self-pay | Admitting: Family Medicine

## 2017-12-18 MED ORDER — TRAZODONE HCL 100 MG PO TABS: 100.0000 mg | ORAL_TABLET | Freq: Every day | ORAL | 0 refills | Status: DC

## 2017-12-26 ENCOUNTER — Encounter: Payer: Self-pay | Admitting: Family Medicine

## 2017-12-26 ENCOUNTER — Telehealth: Payer: Self-pay | Admitting: Family Medicine

## 2017-12-26 ENCOUNTER — Ambulatory Visit (INDEPENDENT_AMBULATORY_CARE_PROVIDER_SITE_OTHER): Payer: Medicare Other | Admitting: Family Medicine

## 2017-12-26 VITALS — BP 106/65 | HR 71 | Ht 65.0 in | Wt 175.0 lb

## 2017-12-26 DIAGNOSIS — K582 Mixed irritable bowel syndrome: Secondary | ICD-10-CM

## 2017-12-26 DIAGNOSIS — F5104 Psychophysiologic insomnia: Secondary | ICD-10-CM | POA: Diagnosis not present

## 2017-12-26 DIAGNOSIS — L570 Actinic keratosis: Secondary | ICD-10-CM | POA: Diagnosis not present

## 2017-12-26 DIAGNOSIS — I10 Essential (primary) hypertension: Secondary | ICD-10-CM | POA: Diagnosis not present

## 2017-12-26 DIAGNOSIS — Z6829 Body mass index (BMI) 29.0-29.9, adult: Secondary | ICD-10-CM | POA: Diagnosis not present

## 2017-12-26 DIAGNOSIS — E559 Vitamin D deficiency, unspecified: Secondary | ICD-10-CM | POA: Diagnosis not present

## 2017-12-26 DIAGNOSIS — R790 Abnormal level of blood mineral: Secondary | ICD-10-CM

## 2017-12-26 MED ORDER — TRAZODONE HCL 100 MG PO TABS: 100.0000 mg | ORAL_TABLET | Freq: Every day | ORAL | 3 refills | Status: DC

## 2017-12-26 MED ORDER — IMIQUIMOD 5 % EX CREA
TOPICAL_CREAM | CUTANEOUS | 0 refills | Status: DC
Start: 2017-12-27 — End: 2018-05-03

## 2017-12-26 NOTE — Telephone Encounter (Signed)
Left VM for Pt to return clinic call regarding PCP recommendation.

## 2017-12-26 NOTE — Progress Notes (Signed)
Subjective:    CC:   HPI: Hypertension- Pt denies chest pain, SOB, dizziness, or heart palpitations.  Taking meds as directed w/o problems.  Denies medication side effects.    She has a hx of lupus and has been experiencing rash on face and hair loss.    She also has a spot on her left ear that is painful.  She says it stays crusty.    She has actually lost about 20 lbs  She says she has been on Nutrisystem to lose weight.  She weighed 195 pounds in August and she is down to 175 pounds which is fantastic.  She still having problems with intermittent constipation and diarrhea.  It seems to fluctuate.  She tries to keep her fiber intake consistent.  She is not currently on any medication for her bowels.  She sometimes use MiraLAX for constipation.  Vitamin D deficiency-she has been taking her supplement.  She wants to hold off on having it rechecked today as the last time her insurance did not pay for the vitamin D level.  Past medical history, Surgical history, Family history not pertinant except as noted below, Social history, Allergies, and medications have been entered into the medical record, reviewed, and corrections made.   Review of Systems: No fevers, chills, night sweats, weight loss, chest pain, or shortness of breath.   Objective:    General: Well Developed, well nourished, and in no acute distress.  Neuro: Alert and oriented x3, extra-ocular muscles intact, sensation grossly intact.  HEENT: Normocephalic, atraumatic  Skin: Warm and dry, no rashes.  She does have a erythematous dry crusty lesion on the upper pinna of the left outer ear. Cardiac: Regular rate and rhythm, no murmurs rubs or gallops, no lower extremity edema.  Respiratory: Clear to auscultation bilaterally. Not using accessory muscles, speaking in full sentences.   Impression and Recommendations:   Hypertension-blood pressure looks great.  In fact we can probably have her go ahead and cut her HCTZ in half and  see if her blood pressure still looks good on half a tab.  I think the weight loss has made a huge impact.  BMI 29-she is doing absolutely fantastic.  She is lost about 20 pounds which is wonderful.  She is due for repeat BMP.  Low ferritin-due to recheck ferritin.  Low vitamin D-continue with current supplementation.  Opted not to recheck today as the last time her insurance did not pay for her vitamin D level.  Irritable bowel syndrome-recommend a trial of IB Gard.  Lesion on left upper outer pinna-concerning for actinic keratoses-recommend treatment with either cryotherapy or medical mode.  We will send over prescription for the medical mode for her to try first.  Chronic insomnia-we will refill trazodone for 90-day supply.

## 2017-12-26 NOTE — Telephone Encounter (Signed)
Please call pt: since BP so good, please have her cut her HCTZ in half and then recheck BP at home or with nurse visit in a a few weeks.    Beatrice Lecher, MD

## 2017-12-26 NOTE — Patient Instructions (Signed)
Can try IBGard for your bowels.

## 2017-12-27 ENCOUNTER — Other Ambulatory Visit: Payer: Self-pay | Admitting: *Deleted

## 2017-12-27 LAB — COMPLETE METABOLIC PANEL WITH GFR
AG RATIO: 1.5 (calc) (ref 1.0–2.5)
ALKALINE PHOSPHATASE (APISO): 97 U/L (ref 33–130)
ALT: 20 U/L (ref 6–29)
AST: 33 U/L (ref 10–35)
Albumin: 4.6 g/dL (ref 3.6–5.1)
BUN / CREAT RATIO: 19 (calc) (ref 6–22)
BUN: 19 mg/dL (ref 7–25)
CO2: 34 mmol/L — ABNORMAL HIGH (ref 20–32)
Calcium: 10.7 mg/dL — ABNORMAL HIGH (ref 8.6–10.4)
Chloride: 101 mmol/L (ref 98–110)
Creat: 0.99 mg/dL — ABNORMAL HIGH (ref 0.60–0.93)
GFR, Est African American: 66 mL/min/{1.73_m2} (ref >=60)
GFR, Est Non African American: 57 mL/min/{1.73_m2} — ABNORMAL LOW (ref >=60)
GLOBULIN: 3.1 g/dL (ref 1.9–3.7)
Glucose, Bld: 92 mg/dL (ref 65–99)
POTASSIUM: 4.1 mmol/L (ref 3.5–5.3)
SODIUM: 141 mmol/L (ref 135–146)
Total Bilirubin: 0.6 mg/dL (ref 0.2–1.2)
Total Protein: 7.7 g/dL (ref 6.1–8.1)

## 2017-12-27 LAB — FERRITIN: FERRITIN: 73 ng/mL (ref 20–288)

## 2017-12-27 NOTE — Telephone Encounter (Signed)
Pt advised and stated that she will call back w/BP readings.Katherine Nixon, [place]

## 2017-12-31 ENCOUNTER — Other Ambulatory Visit: Payer: Self-pay | Admitting: Family Medicine

## 2018-01-01 ENCOUNTER — Other Ambulatory Visit: Payer: Self-pay | Admitting: *Deleted

## 2018-01-12 ENCOUNTER — Ambulatory Visit: Payer: Medicare Other | Admitting: Family Medicine

## 2018-01-19 DIAGNOSIS — M1712 Unilateral primary osteoarthritis, left knee: Secondary | ICD-10-CM | POA: Diagnosis not present

## 2018-01-19 DIAGNOSIS — C4491 Basal cell carcinoma of skin, unspecified: Secondary | ICD-10-CM

## 2018-01-19 DIAGNOSIS — M1711 Unilateral primary osteoarthritis, right knee: Secondary | ICD-10-CM | POA: Diagnosis not present

## 2018-01-19 HISTORY — PX: BASAL CELL CARCINOMA EXCISION: SHX1214

## 2018-01-19 HISTORY — DX: Basal cell carcinoma of skin, unspecified: C44.91

## 2018-01-23 DIAGNOSIS — L570 Actinic keratosis: Secondary | ICD-10-CM | POA: Diagnosis not present

## 2018-01-23 DIAGNOSIS — D485 Neoplasm of uncertain behavior of skin: Secondary | ICD-10-CM | POA: Diagnosis not present

## 2018-01-23 DIAGNOSIS — C44319 Basal cell carcinoma of skin of other parts of face: Secondary | ICD-10-CM | POA: Diagnosis not present

## 2018-01-23 DIAGNOSIS — H61002 Unspecified perichondritis of left external ear: Secondary | ICD-10-CM | POA: Diagnosis not present

## 2018-01-30 ENCOUNTER — Encounter: Payer: Self-pay | Admitting: Sports Medicine

## 2018-01-30 ENCOUNTER — Ambulatory Visit (INDEPENDENT_AMBULATORY_CARE_PROVIDER_SITE_OTHER): Payer: Medicare Other | Admitting: Sports Medicine

## 2018-01-30 DIAGNOSIS — G894 Chronic pain syndrome: Secondary | ICD-10-CM

## 2018-01-30 MED ORDER — MELOXICAM 15 MG PO TABS: ORAL_TABLET | ORAL | 3 refills | Status: DC

## 2018-01-30 MED ORDER — DULOXETINE HCL 60 MG PO CPEP: 60.0000 mg | ORAL_CAPSULE | Freq: Every day | ORAL | 3 refills | Status: DC

## 2018-01-30 NOTE — Assessment & Plan Note (Signed)
Has been out of Cymbalta for 1 month, meloxicam as well, refilling both. Year supply given, I am happy to see her yearly and do refills, this is providing her good relief of her chronic pain. She does have bilateral knee arthroplasty scheduled for next month.

## 2018-01-30 NOTE — Progress Notes (Signed)
Subjective:    CC: Follow-up  HPI: This is a pleasant 72 year old female, she has chronic pain syndrome, well controlled historically with Cymbalta and meloxicam, she ran out of meloxicam and Cymbalta and her pain came back not surprisingly, needs a refill.  She is also being set up for bilateral total knee arthroplasty next month.  I reviewed the past medical history, family history, social history, surgical history, and allergies today and no changes were needed.  Please see the problem list section below in epic for further details.  Past Medical History: Past Medical History:  Diagnosis Date  . Anxiety   . Arthritis   . Chronic diarrhea   . Complication of anesthesia    diff to wake up with gallbaldder surgery- no problems since   . Depression   . Dysrhythmia     states at times  . Headache   . Hypertension   . Neuromuscular disorder (HCC)    lupus  . Squamous cell carcinoma    Past Surgical History: Past Surgical History:  Procedure Laterality Date  . BREAST BIOPSY    . BREAST SURGERY     biopsies/ aspirations - neg  . CATARACT EXTRACTION     with lens implanted  . CHOLECYSTECTOMY    . COLONOSCOPY    . DILATION AND CURETTAGE OF UTERUS    . KNEE ARTHROSCOPY Bilateral   . LAPAROSCOPIC CHOLECYSTECTOMY    . NASAL SINUS SURGERY    . SHOULDER SURGERY Left 12/2014   Dr. Tamera Punt   . TOTAL HIP ARTHROPLASTY Right 08/31/2016  . TOTAL HIP ARTHROPLASTY Right 08/31/2016   Procedure: TOTAL HIP ARTHROPLASTY ANTERIOR APPROACH;  Surgeon: Frederik Pear, MD;  Location: [place];  Service: Orthopedics;  Laterality: Right;  Marland Kitchen VARICOSE VEIN SURGERY     Social History: Social History   Socioeconomic History  . Marital status: Married    Spouse name: Jimmie  . Number of children: 4  . Years of education: 44  . Highest education level: None  Social Needs  . Financial resource strain: None  . Food insecurity - worry: None  . Food insecurity - inability: None  . Transportation needs  - medical: None  . Transportation needs - non-medical: None  Occupational History  . 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 Smoking status: Former Smoker    Types: Cigarettes    Last attempt to quit: 11/22/1987    Years since quitting: 30.2  . Smokeless tobacco: Never Used  Substance and Sexual Activity  . Alcohol use: No  . Drug use: No  . Sexual activity: No  Other Topics Concern  . None  Social History Narrative  . None   Family History: Family History  Problem Relation Age of Onset  . Heart disease Mother   . Heart attack Mother   . Hyperlipidemia Father   . Other Father        amputaiton  . Hyperlipidemia Brother   . Hypertension Brother    Allergies: Allergies  Allergen Reactions  . Gabapentin Other (See Comments)    Hallucination   . Nsaids Other (See Comments)    Due to liver enzyme issue (meloxicam is ok per patient)  . Tramadol Other (See Comments)    Hallucinations   . Bactrim Rash  . Pravastatin Other (See Comments)    Muscle cramping  . Sulfamethoxazole-Trimethoprim Other (See Comments)    Thrush in mouth   Medications: See med rec.  Review of  Systems: No fevers, chills, night sweats, weight loss, chest pain, or shortness of breath.   Objective:    General: Well Developed, well nourished, and in no acute distress.  Neuro: Alert and oriented x3, extra-ocular muscles intact, sensation grossly intact.  HEENT: Normocephalic, atraumatic, pupils equal round reactive to light, neck supple, no masses, no lymphadenopathy, thyroid nonpalpable.  Skin: Warm and dry, no rashes. Cardiac: Regular rate and rhythm, no murmurs rubs or gallops, no lower extremity edema.  Respiratory: Clear to auscultation bilaterally. Not using accessory muscles, speaking in full sentences.  Impression and Recommendations:    Chronic pain syndrome Has been out of Cymbalta for 1 month, meloxicam as well, refilling both. Year supply given,  I am happy to see her yearly and do refills, this is providing her good relief of her chronic pain. She does have bilateral knee arthroplasty scheduled for next month.  I spent 25 minutes with this patient, greater than 50% was face-to-face time counseling regarding the above diagnoses ___________________________________________ Gwen Her. Dianah Field, M.D., ABFM., CAQSM. Primary Care and [place] Instructor of [place] [facility]

## 2018-01-31 ENCOUNTER — Other Ambulatory Visit: Payer: Self-pay | Admitting: Orthopedic Surgery

## 2018-02-06 DIAGNOSIS — C4401 Basal cell carcinoma of skin of lip: Secondary | ICD-10-CM | POA: Diagnosis not present

## 2018-02-28 NOTE — Pre-Procedure Instructions (Signed)
Katherine Nixon  02/28/2018      [facility] [address] Phone: [phone] Fax: [phone]    Your procedure is scheduled on Mon. April 22  Report to [facility] Admitting at 5:30 A.M.  Call this number if you have problems the morning of surgery:  [phone]   Remember:  Do not eat food or drink liquids after midnight on Sun. April 21   Take these medicines the morning of surgery with A SIP OF WATER : amlodipine (norvasc), duloxetine (cymbalta),               7 days prior to surgery STOP taking any Aspirin(unless otherwise instructed by your surgeon), Aleve, Naproxen, Ibuprofen, Motrin, Advil, Goody's, BC's, all herbal medications, fish oil, and all vitamins, and meloxicam (mobic)   Do not wear jewelry, make-up or nail polish.  Do not wear lotions, powders, or perfumes, or deodorant.  Do not shave 48 hours prior to surgery.  Men may shave face and neck.  Do not bring valuables to the hospital.  [facility] is not responsible for any belongings or valuables.  Contacts, dentures or bridgework may not be worn into surgery.  Leave your suitcase in the car.  After surgery it may be brought to your room.  For patients admitted to the hospital, discharge time will be determined by your treatment team.  Patients discharged the day of surgery will not be allowed to drive home.    Special instructions:   [place]- Preparing For Surgery  Before surgery, you can play an important role. Because skin is not sterile, your skin needs to be as free of germs as possible. You can reduce the number of germs on your skin by washing with CHG (chlorahexidine gluconate) Soap before surgery.  CHG is an antiseptic cleaner which kills germs and bonds with the skin to continue killing germs even after washing.  Please do not use if you have an allergy to CHG or  antibacterial soaps. If your skin becomes reddened/irritated stop using the CHG.  Do not shave (including legs and underarms) for at least 48 hours prior to first CHG shower. It is OK to shave your face.  Please follow these instructions carefully.   1. Shower the NIGHT BEFORE SURGERY and the MORNING OF SURGERY with CHG.   2. If you chose to wash your hair, wash your hair first as usual with your normal shampoo.  3. After you shampoo, rinse your hair and body thoroughly to remove the shampoo.  4. Use CHG as you would any other liquid soap. You can apply CHG directly to the skin and wash gently with a scrungie or a clean washcloth.   5. Apply the CHG Soap to your body ONLY FROM THE NECK DOWN.  Do not use on open wounds or open sores. Avoid contact with your eyes, ears, mouth and genitals (private parts). Wash Face and genitals (private parts)  with your normal soap.  6. Wash thoroughly, paying special attention to the area where your surgery will be performed.  7. Thoroughly rinse your body with warm water from the neck down.  8. DO NOT shower/wash with your normal soap after using and rinsing off the CHG Soap.  9. Pat yourself dry with a CLEAN TOWEL.  10. Wear CLEAN PAJAMAS to bed the night before surgery, wear comfortable  clothes the morning of surgery  11. Place CLEAN SHEETS on your bed the night of your first shower and DO NOT SLEEP WITH PETS.    Day of Surgery: Do not apply any deodorants/lotions. Please wear clean clothes to the hospital/surgery center.      Please read over the following fact sheets that you were given. Coughing and Deep Breathing, MRSA Information and Surgical Site Infection Prevention

## 2018-03-01 ENCOUNTER — Encounter (HOSPITAL_COMMUNITY): Payer: Self-pay

## 2018-03-01 ENCOUNTER — Encounter (HOSPITAL_COMMUNITY)
Admission: RE | Admit: 2018-03-01 | Discharge: 2018-03-01 | Disposition: A | Discharge status: Home / Self Care | Payer: Medicare Other | Source: Ambulatory Visit | Attending: Orthopedic Surgery | Admitting: Orthopedic Surgery

## 2018-03-01 DIAGNOSIS — Z01812 Encounter for preprocedural laboratory examination: Secondary | ICD-10-CM | POA: Insufficient documentation

## 2018-03-01 DIAGNOSIS — Z01810 Encounter for preprocedural cardiovascular examination: Secondary | ICD-10-CM | POA: Diagnosis not present

## 2018-03-01 DIAGNOSIS — M1711 Unilateral primary osteoarthritis, right knee: Secondary | ICD-10-CM | POA: Insufficient documentation

## 2018-03-01 LAB — URINALYSIS, ROUTINE W REFLEX MICROSCOPIC
BACTERIA UA: NONE SEEN
Bilirubin Urine: NEGATIVE
Glucose, UA: NEGATIVE mg/dL
Hgb urine dipstick: NEGATIVE
Ketones, ur: NEGATIVE mg/dL
NITRITE: NEGATIVE
PROTEIN: NEGATIVE mg/dL
SPECIFIC GRAVITY, URINE: 1.016 (ref 1.005–1.030)
pH: 5 (ref 5.0–8.0)

## 2018-03-01 LAB — CBC WITH DIFFERENTIAL/PLATELET
Basophils Absolute: 0 10*3/uL (ref 0.0–0.1)
Basophils Relative: 0 %
EOS ABS: 0.1 10*3/uL (ref 0.0–0.7)
EOS PCT: 2 %
HCT: 41.4 % (ref 36.0–46.0)
Hemoglobin: 13.5 g/dL (ref 12.0–15.0)
LYMPHS ABS: 1.9 10*3/uL (ref 0.7–4.0)
Lymphocytes Relative: 29 %
MCH: 31.8 pg (ref 26.0–34.0)
MCHC: 32.6 g/dL (ref 30.0–36.0)
MCV: 97.6 fL (ref 78.0–100.0)
Monocytes Absolute: 0.6 10*3/uL (ref 0.1–1.0)
Monocytes Relative: 9 %
NEUTROS PCT: 60 %
Neutro Abs: 4 10*3/uL (ref 1.7–7.7)
PLATELETS: 201 10*3/uL (ref 150–400)
RBC: 4.24 MIL/uL (ref 3.87–5.11)
RDW: 15.4 % (ref 11.5–15.5)
WBC: 6.7 10*3/uL (ref 4.0–10.5)

## 2018-03-01 LAB — PROTIME-INR
INR: 1.1
Prothrombin Time: 14.1 seconds (ref 11.4–15.2)

## 2018-03-01 LAB — BASIC METABOLIC PANEL
Anion gap: 10 (ref 5–15)
BUN: 14 mg/dL (ref 6–20)
CHLORIDE: 102 mmol/L (ref 101–111)
CO2: 28 mmol/L (ref 22–32)
Calcium: 10.3 mg/dL (ref 8.9–10.3)
Creatinine, Ser: 0.89 mg/dL (ref 0.44–1.00)
Glucose, Bld: 91 mg/dL (ref 65–99)
Potassium: 3.8 mmol/L (ref 3.5–5.1)
SODIUM: 140 mmol/L (ref 135–145)

## 2018-03-01 LAB — APTT: aPTT: 37 seconds — ABNORMAL HIGH (ref 24–36)

## 2018-03-01 LAB — SURGICAL PCR SCREEN
MRSA, PCR: NEGATIVE
Staphylococcus aureus: NEGATIVE

## 2018-03-02 NOTE — Progress Notes (Signed)
Anesthesia Chart Review:  Pt is a 72 year old female scheduled for R total knee arthroplasty on 03/12/18 with Frederik Pear, MD  PMH includes:  HTN, dysrhythmia. Former smoker (quit 1989). BMI 30.5  - I called and spoke with pt by telephone regarding dysrhythmia that is listed in her history. Pt reports a 30-40 year history of infrequent episodes (3-4 per year) of feeling her heart beat rapidly for 1-2 minutes, associated with anxiety, that resolve spontaneously.  Denies any other associated sx. Pt reports episodes are not particularly bothersome and she's never discussed them with a health care provider.    Medications include: Amlodipine, Lipitor, HCTZ  BP (!) 149/75   Pulse 63   Temp 36.7 C   Resp 20   Ht 5\' 5"  (1.651 m)   Wt 183 lb 3.2 oz (83.1 kg)   SpO2 98%   BMI 30.49 kg/m   Preoperative labs reviewed.    EKG 03/01/18: NSR  If no changes, I anticipate pt can proceed with surgery as scheduled.  Willeen Cass, FNP-BC [facility] Short Stay Surgical Center/Anesthesiology Phone: [phone] 03/02/2018 1:50 PM

## 2018-03-05 ENCOUNTER — Other Ambulatory Visit: Payer: Self-pay | Admitting: Orthopedic Surgery

## 2018-03-05 NOTE — Care Plan (Signed)
Spoke with patient prior to surgery. She will discharge to home with HHPT provided by [facility]. She has all needed equipment from prior surgery. She is scheduled to see Dr. Mayer Camel in follow up on 03/21/18 @ 930 and will follow with OPPT at 1040 at [facility].   Please contact [place] McIntosh. RNCM [phone] for questions or if this plan needs to change

## 2018-03-08 DIAGNOSIS — M1712 Unilateral primary osteoarthritis, left knee: Secondary | ICD-10-CM | POA: Diagnosis present

## 2018-03-08 NOTE — H&P (Signed)
TOTAL KNEE ADMISSION H&P  Patient is being admitted for right total knee arthroplasty.  Subjective:  Chief Complaint:right knee pain.  HPI: Katherine Nixon, 72 y.o. female, has a history of pain and functional disability in the right knee due to arthritis and has failed non-surgical conservative treatments for greater than 12 weeks to includeNSAID's and/or analgesics, corticosteriod injections, flexibility and strengthening excercises and activity modification.  Onset of symptoms was gradual, starting 2 years ago with gradually worsening course since that time. The patient noted no past surgery on the right knee(s).  Patient currently rates pain in the right knee(s) at 10 out of 10 with activity. Patient has night pain, worsening of pain with activity and weight bearing, pain that interferes with activities of daily living, pain with passive range of motion and crepitus.  Patient has evidence of subchondral sclerosis and joint space narrowing by imaging studies.   There is no active infection.  Patient Active Problem List   Diagnosis Date Noted  . Irritable bowel syndrome with both constipation and diarrhea 12/26/2017  . Primary localized osteoarthritis of right hip 08/31/2016  . Arthritis of right hip 08/31/2016  . Constipation, chronic 03/10/2016  . Elevated alkaline phosphatase level 11/24/2015  . Primary osteoarthritis of both first carpometacarpal joints 09/22/2015  . Right shoulder pain 02/23/2015  . Chronic pain syndrome 02/06/2015  . Bilateral carpal tunnel syndrome 01/19/2015  . Dyssomnia 06/15/2014  . Left shoulder pain 06/15/2014  . Dry mouth 06/15/2014  . DDD (degenerative disc disease), lumbar 06/15/2014  . Insomnia 04/30/2014  . Actinic porokeratosis 02/27/2014  . ANA positive 01/24/2014  . History of arthroscopy of left shoulder 01/17/2014  . Strain of gastrocnemius tendon of right lower extremity 12/16/2013  . Osteoarthritis of both knees 11/11/2013  . Lumbar  degenerative disc disease 11/11/2013  . Osteoarthritis, hip, bilateral 10/14/2013  . Lupus (systemic lupus erythematosus) ([place]) 05/29/2013  . Metatarsalgia of left foot 12/24/2012  . Chronic venous insufficiency 12/14/2012  . Hyperlipemia 06/02/2011  . Atrophic vaginitis 06/01/2011  . Essential hypertension, benign 05/09/2011  . GERD (gastroesophageal reflux disease) 05/09/2011  . Varicose veins 05/09/2011  . Urinary incontinence 03/25/2011   Past Medical History:  Diagnosis Date  . Anxiety   . Arthritis   . Chronic diarrhea   . Complication of anesthesia    diff to wake up with gallbaldder surgery- no problems since   . Depression   . Dysrhythmia     states at times  . Headache   . Hypertension   . Neuromuscular disorder (HCC)    lupus  . Squamous cell carcinoma     Past Surgical History:  Procedure Laterality Date  . BREAST BIOPSY    . BREAST SURGERY     biopsies/ aspirations - neg  . CATARACT EXTRACTION     with lens implanted  . CHOLECYSTECTOMY    . COLONOSCOPY    . DILATION AND CURETTAGE OF UTERUS    . KNEE ARTHROSCOPY Bilateral   . LAPAROSCOPIC CHOLECYSTECTOMY    . NASAL SINUS SURGERY    . SHOULDER SURGERY Left 12/2014   Dr. Tamera Punt   . TOTAL HIP ARTHROPLASTY Right 08/31/2016  . TOTAL HIP ARTHROPLASTY Right 08/31/2016   Procedure: TOTAL HIP ARTHROPLASTY ANTERIOR APPROACH;  Surgeon: Frederik Pear, MD;  Location: [place];  Service: Orthopedics;  Laterality: Right;  Marland Kitchen VARICOSE VEIN SURGERY      No current facility-administered medications for this encounter.    Current Outpatient Medications  Medication Sig Dispense Refill Last Dose  .  amLODipine (NORVASC) 5 MG tablet TAKE 1 TABLET DAILY (Patient taking differently: Take 5 mg by mouth nightly) 90 tablet 1 Taking  . atorvastatin (LIPITOR) 20 MG tablet TAKE 1 TABLET DAILY (Patient taking differently: Take 20 mg by mouth nightly) 90 tablet 3   . BIOTIN PO Take 2 tablets by mouth daily.    Taking  . DULoxetine  (CYMBALTA) 60 MG capsule Take 1 capsule (60 mg total) by mouth daily. 90 capsule 3   . hydrochlorothiazide (HYDRODIURIL) 25 MG tablet TAKE 1 TABLET DAILY (Patient taking differently: Take 12.5 mg by mouth daily in the evening) 90 tablet 1 Taking  . Ibuprofen 200 MG CAPS Take 600 mg by mouth every 6 (six) hours as needed (pain).    Taking  . meloxicam (MOBIC) 15 MG tablet TAKE 1 TABLET EVERY MORNING WITH BREAKFAST 90 tablet 3   . Multiple Vitamin (MULTIVITAMIN WITH MINERALS) TABS tablet Take 2 tablets by mouth daily.     . polyethylene glycol (MIRALAX / GLYCOLAX) packet Take 17 g by mouth daily as needed for mild constipation.     . traZODone (DESYREL) 100 MG tablet Take 1 tablet (100 mg total) by mouth at bedtime. 90 tablet 3   . imiquimod (ALDARA) 5 % cream Apply topically 3 (three) times a week. (Patient not taking: Reported on 02/26/2018) 12 each 0 Completed Course at Unknown time   Allergies  Allergen Reactions  . Gabapentin Other (See Comments)    Hallucination   . Nsaids Other (See Comments)    Due to liver enzyme issue (meloxicam is ok per patient)  . Tramadol Other (See Comments)    Hallucinations   . Pravastatin Other (See Comments)    Muscle cramping  . Sulfamethoxazole-Trimethoprim Other (See Comments)    Thrush in mouth    Social History   Tobacco Use  . Smoking status: Former Smoker    Types: Cigarettes    Last attempt to quit: 11/22/1987    Years since quitting: 30.3  . Smokeless tobacco: Never Used  Substance Use Topics  . Alcohol use: No    Family History  Problem Relation Age of Onset  . Heart disease Mother   . Heart attack Mother   . Hyperlipidemia Father   . Other Father        amputaiton  . Hyperlipidemia Brother   . Hypertension Brother      Review of Systems  Constitutional: Negative.   HENT: Negative.   Eyes: Negative.   Respiratory: Negative.   Cardiovascular: Negative.   Gastrointestinal: Negative.   Genitourinary: Negative.    Musculoskeletal: Positive for joint pain.  Skin: Negative.   Neurological: Negative.   Endo/Heme/Allergies: Negative.   Psychiatric/Behavioral: Negative.     Objective:  Physical Exam  Constitutional: She is oriented to person, place, and time. She appears well-developed and well-nourished.  HENT:  Head: Normocephalic and atraumatic.  Eyes: Pupils are equal, round, and reactive to light.  Neck: Normal range of motion. Neck supple.  Cardiovascular: Intact distal pulses.  Respiratory: Effort normal.  Musculoskeletal: She exhibits tenderness.  she continues to have good strength and good range of motion.  She continues to have a range from roughly 0-120.  She does have a valgus deformity on the right.  Tenderness over the medial joint line and also posterior lateral joint line tenderness.  Moderate crepitus with range of motion.  Her calves are soft and nontender.  Neurological: She is alert and oriented to person, place, and time.  Skin:  Skin is warm and dry.  Psychiatric: She has a normal mood and affect. Her behavior is normal. Judgment and thought content normal.    Vital signs in last 24 hours:    Labs:   Estimated body mass index is 30.49 kg/m as calculated from the following:   Height as of 03/01/18: 5\' 5"  (1.651 m).   Weight as of 03/01/18: 83.1 kg (183 lb 3.2 oz).   Imaging Review Plain radiographs demonstrate standing AP and Lutricia Feil x-rays continue to show arthritis primarily to the lateral compartment of both knees, bone-on-bone to right greater than left knee.   Preoperative templating of the joint replacement has been completed, documented, and submitted to the Operating Room personnel in order to optimize intra-operative equipment management.   Anticipated LOS equal to or greater than 2 midnights due to - Age 21 and older with one or more of the following:  - Obesity  - Expected need for hospital services (PT, OT, Nursing) required for safe  discharge  -  Anticipated need for postoperative skilled nursing care or inpatient rehab  - Active co-morbidities: Lupus      Assessment/Plan:  End stage arthritis, right knee   The patient history, physical examination, clinical judgment of the provider and imaging studies are consistent with end stage degenerative joint disease of the right knee(s) and total knee arthroplasty is deemed medically necessary. The treatment options including medical management, injection therapy arthroscopy and arthroplasty were discussed at length. The risks and benefits of total knee arthroplasty were presented and reviewed. The risks due to aseptic loosening, infection, stiffness, patella tracking problems, thromboembolic complications and other imponderables were discussed. The patient acknowledged the explanation, agreed to proceed with the plan and consent was signed. Patient is being admitted for inpatient treatment for surgery, pain control, PT, OT, prophylactic antibiotics, VTE prophylaxis, progressive ambulation and ADL's and discharge planning. The patient is planning to be discharged home with home health services.

## 2018-03-09 MED ORDER — TRANEXAMIC ACID 1000 MG/10ML IV SOLN
2000.0000 mg | INTRAVENOUS | Status: DC
  Filled 2018-03-09: qty 20

## 2018-03-09 MED ORDER — TRANEXAMIC ACID 1000 MG/10ML IV SOLN
1000.0000 mg | INTRAVENOUS | Status: AC
  Administered 2018-03-12: 1000 mg via INTRAVENOUS
  Filled 2018-03-09: qty 1100

## 2018-03-09 MED ORDER — BUPIVACAINE LIPOSOME 1.3 % IJ SUSP
20.0000 mL | INTRAMUSCULAR | Status: DC
  Filled 2018-03-09: qty 20

## 2018-03-11 NOTE — Anesthesia Preprocedure Evaluation (Addendum)
Anesthesia Evaluation  Patient identified by MRN, date of birth, ID band Patient awake    Reviewed: Allergy & Precautions, NPO status , Patient's Chart, lab work & pertinent test results  History of Anesthesia Complications Negative for: history of anesthetic complications  Airway Mallampati: II  TM Distance: >3 FB Neck ROM: Full    Dental  (+) Dental Advisory Given   Pulmonary former smoker (quit 1989),    breath sounds clear to auscultation       Cardiovascular hypertension, Pt. on medications (-) angina Rhythm:Regular Rate:Normal     Neuro/Psych Anxiety Depression Chronic back pain    GI/Hepatic negative GI ROS, Neg liver ROS,   Endo/Other  Morbid obesity  Renal/GU negative Renal ROS     Musculoskeletal  (+) Arthritis , Osteoarthritis,    Abdominal (+) + obese,   Peds  Hematology negative hematology ROS (+)   Anesthesia Other Findings   Reproductive/Obstetrics                            Anesthesia Physical Anesthesia Plan  ASA: II  Anesthesia Plan: Spinal   Post-op Pain Management:  [facility] for Post-op pain   Induction:   PONV Risk Score and Plan: 2 and Ondansetron and Dexamethasone  Airway Management Planned: Natural Airway and Nasal Cannula  Additional Equipment:   Intra-op Plan:   Post-operative Plan:   Informed Consent: I have reviewed the patients History and Physical, chart, labs and discussed the procedure including the risks, benefits and alternatives for the proposed anesthesia with the patient or authorized representative who has indicated his/her understanding and acceptance.   Dental advisory given  Plan Discussed with: CRNA and Surgeon  Anesthesia Plan Comments:         Anesthesia Quick Evaluation

## 2018-03-12 ENCOUNTER — Inpatient Hospital Stay (HOSPITAL_COMMUNITY)
Admission: RE | Admit: 2018-03-12 | Discharge: 2018-03-15 | DRG: 470 | Disposition: A | Discharge status: Home Health | Payer: Medicare Other | Source: Ambulatory Visit | Attending: Orthopedic Surgery | Admitting: Orthopedic Surgery

## 2018-03-12 ENCOUNTER — Inpatient Hospital Stay (HOSPITAL_COMMUNITY): Payer: Medicare Other

## 2018-03-12 ENCOUNTER — Encounter (HOSPITAL_COMMUNITY)
Admission: RE | Disposition: A | Discharge status: Home Health | Payer: Self-pay | Source: Ambulatory Visit | Attending: Orthopedic Surgery

## 2018-03-12 ENCOUNTER — Other Ambulatory Visit: Payer: Self-pay

## 2018-03-12 ENCOUNTER — Encounter (HOSPITAL_COMMUNITY): Payer: Self-pay | Admitting: Surgery

## 2018-03-12 ENCOUNTER — Inpatient Hospital Stay (HOSPITAL_COMMUNITY): Payer: Medicare Other | Admitting: Anesthesiology

## 2018-03-12 ENCOUNTER — Inpatient Hospital Stay (HOSPITAL_COMMUNITY): Payer: Medicare Other | Admitting: Emergency Medicine

## 2018-03-12 DIAGNOSIS — F411 Generalized anxiety disorder: Secondary | ICD-10-CM | POA: Diagnosis present

## 2018-03-12 DIAGNOSIS — M25561 Pain in right knee: Secondary | ICD-10-CM | POA: Diagnosis not present

## 2018-03-12 DIAGNOSIS — M1711 Unilateral primary osteoarthritis, right knee: Secondary | ICD-10-CM | POA: Diagnosis not present

## 2018-03-12 DIAGNOSIS — Z96641 Presence of right artificial hip joint: Secondary | ICD-10-CM | POA: Diagnosis present

## 2018-03-12 DIAGNOSIS — K219 Gastro-esophageal reflux disease without esophagitis: Secondary | ICD-10-CM | POA: Diagnosis present

## 2018-03-12 DIAGNOSIS — M329 Systemic lupus erythematosus, unspecified: Secondary | ICD-10-CM | POA: Diagnosis present

## 2018-03-12 DIAGNOSIS — R079 Chest pain, unspecified: Secondary | ICD-10-CM | POA: Diagnosis not present

## 2018-03-12 DIAGNOSIS — Z87891 Personal history of nicotine dependence: Secondary | ICD-10-CM | POA: Diagnosis not present

## 2018-03-12 DIAGNOSIS — M549 Dorsalgia, unspecified: Secondary | ICD-10-CM | POA: Diagnosis not present

## 2018-03-12 DIAGNOSIS — R768 Other specified abnormal immunological findings in serum: Secondary | ICD-10-CM | POA: Diagnosis present

## 2018-03-12 DIAGNOSIS — M1712 Unilateral primary osteoarthritis, left knee: Secondary | ICD-10-CM | POA: Diagnosis present

## 2018-03-12 DIAGNOSIS — E785 Hyperlipidemia, unspecified: Secondary | ICD-10-CM

## 2018-03-12 DIAGNOSIS — Z6830 Body mass index (BMI) 30.0-30.9, adult: Secondary | ICD-10-CM | POA: Diagnosis not present

## 2018-03-12 DIAGNOSIS — K582 Mixed irritable bowel syndrome: Secondary | ICD-10-CM | POA: Diagnosis not present

## 2018-03-12 DIAGNOSIS — R32 Unspecified urinary incontinence: Secondary | ICD-10-CM | POA: Diagnosis present

## 2018-03-12 DIAGNOSIS — I10 Essential (primary) hypertension: Secondary | ICD-10-CM

## 2018-03-12 DIAGNOSIS — G8918 Other acute postprocedural pain: Secondary | ICD-10-CM | POA: Diagnosis not present

## 2018-03-12 DIAGNOSIS — Z79899 Other long term (current) drug therapy: Secondary | ICD-10-CM

## 2018-03-12 DIAGNOSIS — G894 Chronic pain syndrome: Secondary | ICD-10-CM | POA: Diagnosis present

## 2018-03-12 HISTORY — DX: Irritable bowel syndrome, unspecified: K58.9

## 2018-03-12 HISTORY — DX: Basal cell carcinoma of skin, unspecified: C44.91

## 2018-03-12 HISTORY — DX: Migraine, unspecified, not intractable, without status migrainosus: G43.909

## 2018-03-12 HISTORY — DX: Unspecified osteoarthritis, unspecified site: M19.90

## 2018-03-12 HISTORY — DX: Systemic involvement of connective tissue, unspecified: M35.9

## 2018-03-12 HISTORY — DX: Personal history of other medical treatment: Z92.89

## 2018-03-12 HISTORY — PX: TOTAL KNEE ARTHROPLASTY: SHX125

## 2018-03-12 HISTORY — DX: Pure hypercholesterolemia, unspecified: E78.00

## 2018-03-12 LAB — LIPID PANEL
Cholesterol: 138 mg/dL (ref 0–200)
HDL: 65 mg/dL (ref >40)
LDL CALC: 65 mg/dL (ref 0–99)
TRIGLYCERIDES: 40 mg/dL (ref <150)
Total CHOL/HDL Ratio: 2.1 RATIO
VLDL: 8 mg/dL (ref 0–40)

## 2018-03-12 LAB — HEMOGLOBIN A1C
HEMOGLOBIN A1C: 5.6 % (ref 4.8–5.6)
Mean Plasma Glucose: 114.02 mg/dL

## 2018-03-12 LAB — TROPONIN I

## 2018-03-12 SURGERY — ARTHROPLASTY, KNEE, TOTAL
Anesthesia: Spinal | Site: Knee | Laterality: Right

## 2018-03-12 MED ORDER — METOCLOPRAMIDE HCL 5 MG PO TABS: 5.0000 mg | ORAL_TABLET | Freq: Three times a day (TID) | ORAL | Status: DC | PRN

## 2018-03-12 MED ORDER — TRANEXAMIC ACID 1000 MG/10ML IV SOLN
INTRAVENOUS | Status: DC | PRN
  Administered 2018-03-12: 2000 mg via TOPICAL

## 2018-03-12 MED ORDER — HYDROCHLOROTHIAZIDE 25 MG PO TABS
25.0000 mg | ORAL_TABLET | Freq: Every day | ORAL | Status: DC
  Administered 2018-03-12 – 2018-03-15 (×4): 25 mg via ORAL
  Filled 2018-03-12 (×4): qty 1

## 2018-03-12 MED ORDER — TIZANIDINE HCL 2 MG PO TABS: 2.0000 mg | ORAL_TABLET | Freq: Four times a day (QID) | ORAL | 0 refills | Status: DC | PRN

## 2018-03-12 MED ORDER — ALUM & MAG HYDROXIDE-SIMETH 200-200-20 MG/5ML PO SUSP: 30.0000 mL | ORAL | Status: DC | PRN

## 2018-03-12 MED ORDER — LACTATED RINGERS IV SOLN
INTRAVENOUS | Status: DC
  Administered 2018-03-12 (×2): via INTRAVENOUS

## 2018-03-12 MED ORDER — POLYETHYLENE GLYCOL 3350 17 G PO PACK
17.0000 g | PACK | Freq: Every day | ORAL | Status: DC | PRN
  Filled 2018-03-12: qty 1

## 2018-03-12 MED ORDER — ZOLPIDEM TARTRATE 5 MG PO TABS: 5.0000 mg | ORAL_TABLET | Freq: Every evening | ORAL | Status: DC | PRN

## 2018-03-12 MED ORDER — DIPHENHYDRAMINE HCL 12.5 MG/5ML PO ELIX: 12.5000 mg | ORAL_SOLUTION | ORAL | Status: DC | PRN

## 2018-03-12 MED ORDER — PROPOFOL 500 MG/50ML IV EMUL
INTRAVENOUS | Status: DC | PRN
  Administered 2018-03-12: 100 ug/kg/min via INTRAVENOUS

## 2018-03-12 MED ORDER — BUPIVACAINE-EPINEPHRINE (PF) 0.25% -1:200000 IJ SOLN
INTRAMUSCULAR | Status: AC
  Filled 2018-03-12: qty 30

## 2018-03-12 MED ORDER — MENTHOL 3 MG MT LOZG: 1.0000 | LOZENGE | OROMUCOSAL | Status: DC | PRN

## 2018-03-12 MED ORDER — MIDAZOLAM HCL 2 MG/2ML IJ SOLN
INTRAMUSCULAR | Status: AC
  Filled 2018-03-12: qty 2

## 2018-03-12 MED ORDER — TRANEXAMIC ACID 1000 MG/10ML IV SOLN
1000.0000 mg | Freq: Once | INTRAVENOUS | Status: AC
  Administered 2018-03-12: 1000 mg via INTRAVENOUS
  Filled 2018-03-12 (×2): qty 10

## 2018-03-12 MED ORDER — METHOCARBAMOL 500 MG PO TABS
500.0000 mg | ORAL_TABLET | Freq: Four times a day (QID) | ORAL | Status: DC | PRN
  Administered 2018-03-12 – 2018-03-14 (×5): 500 mg via ORAL
  Filled 2018-03-12 (×5): qty 1

## 2018-03-12 MED ORDER — AMLODIPINE BESYLATE 5 MG PO TABS
5.0000 mg | ORAL_TABLET | Freq: Every day | ORAL | Status: DC
  Administered 2018-03-12 – 2018-03-15 (×4): 5 mg via ORAL
  Filled 2018-03-12 (×4): qty 1

## 2018-03-12 MED ORDER — ASPIRIN EC 81 MG PO TBEC
81.0000 mg | DELAYED_RELEASE_TABLET | Freq: Every day | ORAL | Status: DC
  Administered 2018-03-13 – 2018-03-15 (×3): 81 mg via ORAL
  Filled 2018-03-12 (×4): qty 1

## 2018-03-12 MED ORDER — 0.9 % SODIUM CHLORIDE (POUR BTL) OPTIME
TOPICAL | Status: DC | PRN
  Administered 2018-03-12: 1000 mL

## 2018-03-12 MED ORDER — PHENYLEPHRINE HCL 10 MG/ML IJ SOLN
INTRAVENOUS | Status: DC | PRN
  Administered 2018-03-12: 50 ug/min via INTRAVENOUS

## 2018-03-12 MED ORDER — BISACODYL 5 MG PO TBEC: 5.0000 mg | DELAYED_RELEASE_TABLET | Freq: Every day | ORAL | Status: DC | PRN

## 2018-03-12 MED ORDER — MEPERIDINE HCL 50 MG/ML IJ SOLN: 6.2500 mg | INTRAMUSCULAR | Status: DC | PRN

## 2018-03-12 MED ORDER — DULOXETINE HCL 60 MG PO CPEP
60.0000 mg | ORAL_CAPSULE | Freq: Every day | ORAL | Status: DC
  Administered 2018-03-12 – 2018-03-15 (×4): 60 mg via ORAL
  Filled 2018-03-12 (×4): qty 1

## 2018-03-12 MED ORDER — OXYCODONE-ACETAMINOPHEN 5-325 MG PO TABS: 1.0000 | ORAL_TABLET | ORAL | 0 refills | Status: DC | PRN

## 2018-03-12 MED ORDER — LIDOCAINE HCL (CARDIAC) PF 100 MG/5ML IV SOSY
PREFILLED_SYRINGE | INTRAVENOUS | Status: DC | PRN
  Administered 2018-03-12: 80 mg via INTRAVENOUS

## 2018-03-12 MED ORDER — MIDAZOLAM HCL 2 MG/2ML IJ SOLN: 0.5000 mg | Freq: Once | INTRAMUSCULAR | Status: DC | PRN

## 2018-03-12 MED ORDER — PROPOFOL 10 MG/ML IV BOLUS
INTRAVENOUS | Status: AC
  Filled 2018-03-12: qty 20

## 2018-03-12 MED ORDER — FENTANYL CITRATE (PF) 100 MCG/2ML IJ SOLN
INTRAMUSCULAR | Status: DC | PRN
  Administered 2018-03-12: 50 ug via INTRAVENOUS

## 2018-03-12 MED ORDER — ASPIRIN 325 MG PO TABS
325.0000 mg | ORAL_TABLET | Freq: Once | ORAL | Status: AC
  Administered 2018-03-12: 325 mg via ORAL
  Filled 2018-03-12: qty 1

## 2018-03-12 MED ORDER — CEFAZOLIN SODIUM-DEXTROSE 2-4 GM/100ML-% IV SOLN
INTRAVENOUS | Status: AC
  Filled 2018-03-12: qty 100

## 2018-03-12 MED ORDER — ACETAMINOPHEN 325 MG PO TABS
325.0000 mg | ORAL_TABLET | Freq: Four times a day (QID) | ORAL | Status: DC | PRN
  Administered 2018-03-12 – 2018-03-14 (×3): 650 mg via ORAL
  Filled 2018-03-12 (×3): qty 2

## 2018-03-12 MED ORDER — ATORVASTATIN CALCIUM 20 MG PO TABS
20.0000 mg | ORAL_TABLET | Freq: Every day | ORAL | Status: DC
  Administered 2018-03-12 – 2018-03-13 (×2): 20 mg via ORAL
  Filled 2018-03-12 (×2): qty 1

## 2018-03-12 MED ORDER — HYDROMORPHONE HCL 2 MG/ML IJ SOLN: 0.2500 mg | INTRAMUSCULAR | Status: DC | PRN

## 2018-03-12 MED ORDER — METOCLOPRAMIDE HCL 5 MG/ML IJ SOLN: 5.0000 mg | Freq: Three times a day (TID) | INTRAMUSCULAR | Status: DC | PRN

## 2018-03-12 MED ORDER — FENTANYL CITRATE (PF) 250 MCG/5ML IJ SOLN
INTRAMUSCULAR | Status: AC
  Filled 2018-03-12: qty 5

## 2018-03-12 MED ORDER — DOCUSATE SODIUM 100 MG PO CAPS
100.0000 mg | ORAL_CAPSULE | Freq: Two times a day (BID) | ORAL | Status: DC
  Administered 2018-03-13 – 2018-03-15 (×4): 100 mg via ORAL
  Filled 2018-03-12 (×6): qty 1

## 2018-03-12 MED ORDER — ASPIRIN 325 MG PO TABS
325.0000 mg | ORAL_TABLET | Freq: Every day | ORAL | Status: DC
Start: 2018-03-12 — End: 2018-03-12

## 2018-03-12 MED ORDER — PHENYLEPHRINE HCL 10 MG/ML IJ SOLN
INTRAMUSCULAR | Status: DC | PRN
  Administered 2018-03-12 (×5): 80 ug via INTRAVENOUS

## 2018-03-12 MED ORDER — APIXABAN 2.5 MG PO TABS
2.5000 mg | ORAL_TABLET | Freq: Two times a day (BID) | ORAL | Status: DC
  Administered 2018-03-13 – 2018-03-15 (×5): 2.5 mg via ORAL
  Filled 2018-03-12 (×5): qty 1

## 2018-03-12 MED ORDER — ONDANSETRON HCL 4 MG/2ML IJ SOLN: 4.0000 mg | Freq: Four times a day (QID) | INTRAMUSCULAR | Status: DC | PRN

## 2018-03-12 MED ORDER — CEFAZOLIN SODIUM-DEXTROSE 2-4 GM/100ML-% IV SOLN
2.0000 g | INTRAVENOUS | Status: AC
  Administered 2018-03-12: 2 g via INTRAVENOUS

## 2018-03-12 MED ORDER — PROMETHAZINE HCL 25 MG/ML IJ SOLN
6.2500 mg | INTRAMUSCULAR | Status: DC | PRN
Start: 2018-03-12 — End: 2018-03-12

## 2018-03-12 MED ORDER — APIXABAN 2.5 MG PO TABS: 2.5000 mg | ORAL_TABLET | Freq: Two times a day (BID) | ORAL | 0 refills | Status: DC

## 2018-03-12 MED ORDER — KCL IN DEXTROSE-NACL 20-5-0.45 MEQ/L-%-% IV SOLN
INTRAVENOUS | Status: DC
  Administered 2018-03-12 (×2): via INTRAVENOUS
  Filled 2018-03-12 (×2): qty 1000

## 2018-03-12 MED ORDER — FLEET ENEMA 7-19 GM/118ML RE ENEM: 1.0000 | ENEMA | Freq: Once | RECTAL | Status: DC | PRN

## 2018-03-12 MED ORDER — BUPIVACAINE IN DEXTROSE 0.75-8.25 % IT SOLN
INTRATHECAL | Status: DC | PRN
  Administered 2018-03-12: 12 mg via INTRATHECAL

## 2018-03-12 MED ORDER — BUPIVACAINE LIPOSOME 1.3 % IJ SUSP
INTRAMUSCULAR | Status: DC | PRN
  Administered 2018-03-12: 20 mL

## 2018-03-12 MED ORDER — SODIUM CHLORIDE 0.9 % IJ SOLN
INTRAMUSCULAR | Status: DC | PRN
  Administered 2018-03-12: 50 mL

## 2018-03-12 MED ORDER — NITROGLYCERIN 0.4 MG SL SUBL: 0.4000 mg | SUBLINGUAL_TABLET | SUBLINGUAL | Status: DC | PRN

## 2018-03-12 MED ORDER — ROPIVACAINE HCL 7.5 MG/ML IJ SOLN
INTRAMUSCULAR | Status: DC | PRN
  Administered 2018-03-12: 20 mL via PERINEURAL

## 2018-03-12 MED ORDER — MIDAZOLAM HCL 2 MG/2ML IJ SOLN
INTRAMUSCULAR | Status: DC | PRN
  Administered 2018-03-12 (×2): 1 mg via INTRAVENOUS

## 2018-03-12 MED ORDER — CHLORHEXIDINE GLUCONATE 4 % EX LIQD: 60.0000 mL | Freq: Once | CUTANEOUS | Status: DC

## 2018-03-12 MED ORDER — PHENOL 1.4 % MT LIQD: 1.0000 | OROMUCOSAL | Status: DC | PRN

## 2018-03-12 MED ORDER — TRAZODONE HCL 100 MG PO TABS
100.0000 mg | ORAL_TABLET | Freq: Every day | ORAL | Status: DC
  Administered 2018-03-12 – 2018-03-14 (×3): 100 mg via ORAL
  Filled 2018-03-12 (×3): qty 1

## 2018-03-12 MED ORDER — SODIUM CHLORIDE 0.9 % IR SOLN
Status: DC | PRN
  Administered 2018-03-12: 3000 mL

## 2018-03-12 MED ORDER — HYDROMORPHONE HCL 2 MG/ML IJ SOLN
0.5000 mg | INTRAMUSCULAR | Status: DC | PRN
  Administered 2018-03-12 (×3): 1 mg via INTRAVENOUS
  Administered 2018-03-13: 0.5 mg via INTRAVENOUS
  Filled 2018-03-12 (×4): qty 1

## 2018-03-12 MED ORDER — OXYCODONE HCL 5 MG PO TABS
5.0000 mg | ORAL_TABLET | ORAL | Status: DC | PRN
  Administered 2018-03-12 – 2018-03-14 (×10): 10 mg via ORAL
  Filled 2018-03-12 (×10): qty 2

## 2018-03-12 MED ORDER — PROPOFOL 10 MG/ML IV BOLUS
INTRAVENOUS | Status: DC | PRN
  Administered 2018-03-12: 30 mg via INTRAVENOUS

## 2018-03-12 MED ORDER — BUPIVACAINE-EPINEPHRINE (PF) 0.25% -1:200000 IJ SOLN
INTRAMUSCULAR | Status: DC | PRN
  Administered 2018-03-12: 30 mL

## 2018-03-12 MED ORDER — GI COCKTAIL ~~LOC~~
30.0000 mL | Freq: Three times a day (TID) | ORAL | Status: DC | PRN
Start: 2018-03-12 — End: 2018-03-15
  Filled 2018-03-12 (×2): qty 30

## 2018-03-12 MED ORDER — PANTOPRAZOLE SODIUM 40 MG PO TBEC
40.0000 mg | DELAYED_RELEASE_TABLET | Freq: Every day | ORAL | Status: DC
  Administered 2018-03-12 – 2018-03-15 (×4): 40 mg via ORAL
  Filled 2018-03-12 (×4): qty 1

## 2018-03-12 MED ORDER — ONDANSETRON HCL 4 MG PO TABS: 4.0000 mg | ORAL_TABLET | Freq: Four times a day (QID) | ORAL | Status: DC | PRN

## 2018-03-12 MED ORDER — METHOCARBAMOL 1000 MG/10ML IJ SOLN
500.0000 mg | Freq: Four times a day (QID) | INTRAVENOUS | Status: DC | PRN
  Filled 2018-03-12: qty 5

## 2018-03-12 SURGICAL SUPPLY — 50 items
BANDAGE ESMARK 6X9 LF (GAUZE/BANDAGES/DRESSINGS) ×1 IMPLANT
BLADE SAG 18X100X1.27 (BLADE) ×3 IMPLANT
BLADE SAGITTAL 13X1.27X60 (BLADE) IMPLANT
BLADE SAGITTAL 13X1.27X60MM (BLADE)
BLADE SAW SGTL 13X75X1.27 (BLADE) ×3 IMPLANT
BNDG ELASTIC 6X10 VLCR STRL LF (GAUZE/BANDAGES/DRESSINGS) ×3 IMPLANT
BNDG ESMARK 6X9 LF (GAUZE/BANDAGES/DRESSINGS) ×3
BOWL SMART MIX CTS (DISPOSABLE) ×3 IMPLANT
CEMENT HV SMART SET (Cement) ×6 IMPLANT
COVER SURGICAL LIGHT HANDLE (MISCELLANEOUS) ×3 IMPLANT
CUFF TOURNIQUET SINGLE 34IN LL (TOURNIQUET CUFF) ×3 IMPLANT
CUFF TOURNIQUET SINGLE 44IN (TOURNIQUET CUFF) IMPLANT
DRAPE EXTREMITY T 121X128X90 (DRAPE) ×3 IMPLANT
DRAPE U-SHAPE 47X51 STRL (DRAPES) ×3 IMPLANT
DRSG AQUACEL AG ADV 3.5X10 (GAUZE/BANDAGES/DRESSINGS) ×3 IMPLANT
DURAPREP 26ML APPLICATOR (WOUND CARE) ×3 IMPLANT
ELECT REM PT RETURN 9FT ADLT (ELECTROSURGICAL) ×3
ELECTRODE REM PT RTRN 9FT ADLT (ELECTROSURGICAL) ×1 IMPLANT
GLOVE BIO SURGEON STRL SZ7.5 (GLOVE) ×3 IMPLANT
GLOVE BIO SURGEON STRL SZ8.5 (GLOVE) ×3 IMPLANT
GLOVE BIOGEL PI IND STRL 8 (GLOVE) ×1 IMPLANT
GLOVE BIOGEL PI IND STRL 9 (GLOVE) ×1 IMPLANT
GLOVE BIOGEL PI INDICATOR 8 (GLOVE) ×2
GLOVE BIOGEL PI INDICATOR 9 (GLOVE) ×2
GOWN STRL REUS W/ TWL LRG LVL3 (GOWN DISPOSABLE) ×1 IMPLANT
GOWN STRL REUS W/ TWL XL LVL3 (GOWN DISPOSABLE) ×2 IMPLANT
GOWN STRL REUS W/TWL LRG LVL3 (GOWN DISPOSABLE) ×2
GOWN STRL REUS W/TWL XL LVL3 (GOWN DISPOSABLE) ×4
HANDPIECE INTERPULSE COAX TIP (DISPOSABLE) ×2
HOOD PEEL AWAY FACE SHEILD DIS (HOOD) ×6 IMPLANT
KIT BASIN OR (CUSTOM PROCEDURE TRAY) ×3 IMPLANT
KIT TURNOVER KIT B (KITS) ×3 IMPLANT
MANIFOLD NEPTUNE II (INSTRUMENTS) ×3 IMPLANT
NEEDLE 22X1 1/2 (OR ONLY) (NEEDLE) ×6 IMPLANT
NS IRRIG 1000ML POUR BTL (IV SOLUTION) ×3 IMPLANT
PACK TOTAL JOINT (CUSTOM PROCEDURE TRAY) ×3 IMPLANT
PAD ARMBOARD 7.5X6 YLW CONV (MISCELLANEOUS) ×3 IMPLANT
SET HNDPC FAN SPRY TIP SCT (DISPOSABLE) ×1 IMPLANT
SUT VIC AB 0 CT1 27 (SUTURE) ×2
SUT VIC AB 0 CT1 27XBRD ANBCTR (SUTURE) ×1 IMPLANT
SUT VIC AB 1 CTX 36 (SUTURE) ×2
SUT VIC AB 1 CTX36XBRD ANBCTR (SUTURE) ×1 IMPLANT
SUT VIC AB 2-0 CT1 27 (SUTURE) ×2
SUT VIC AB 2-0 CT1 TAPERPNT 27 (SUTURE) ×1 IMPLANT
SUT VIC AB 3-0 CT1 27 (SUTURE) ×2
SUT VIC AB 3-0 CT1 TAPERPNT 27 (SUTURE) ×1 IMPLANT
SYR CONTROL 10ML LL (SYRINGE) ×6 IMPLANT
TOWEL OR 17X24 6PK STRL BLUE (TOWEL DISPOSABLE) ×3 IMPLANT
TOWEL OR 17X26 10 PK STRL BLUE (TOWEL DISPOSABLE) ×3 IMPLANT
TRAY CATH 16FR W/PLASTIC CATH (SET/KITS/TRAYS/PACK) ×3 IMPLANT

## 2018-03-12 NOTE — CV Procedure (Signed)
Echocardiogram not completed per patients request. Mrs. Hirsch said "she didn't believe the echocardiogram was necessary, the pain she felt earlier was from an esophageal spasm not from her heart." If she changes her mind please call [phone].  Darlina Sicilian RDCS

## 2018-03-12 NOTE — Interval H&P Note (Signed)
History and Physical Interval Note:  03/12/2018 7:10 AM  Katherine Nixon  has presented today for surgery, with the diagnosis of RIGHT KNEE OSTEOARTHRITIS  The various methods of treatment have been discussed with the patient and family. After consideration of risks, benefits and other options for treatment, the patient has consented to  Procedure(s): TOTAL KNEE ARTHROPLASTY (Right) as a surgical intervention .  The patient's history has been reviewed, patient examined, no change in status, stable for surgery.  I have reviewed the patient's chart and labs.  Questions were answered to the patient's satisfaction.     Kerin Salen

## 2018-03-12 NOTE — Op Note (Signed)
PATIENT ID:      Katherine Nixon  MRN:     [id number] DOB/AGE:    72/02/1946 / 72 y.o.       OPERATIVE REPORT    DATE OF PROCEDURE:  03/12/2018       PREOPERATIVE DIAGNOSIS:   RIGHT KNEE OSTEOARTHRITIS      Estimated body mass index is 30.49 kg/m as calculated from the following:   Height as of 03/01/18: 5\' 5"  (1.651 m).   Weight as of 03/01/18: 183 lb 3.2 oz (83.1 kg).                                                        POSTOPERATIVE DIAGNOSIS:   RIGHT KNEE OSTEOARTHRITIS                                                                      PROCEDURE:  Procedure(s): R TOTAL KNEE ARTHROPLASTY Using DepuyAttune RP implants #5R Femur, #6Tibia, 5 mm Attune RP bearing, 41 Patella     SURGEON: Kerin Salen    ASSISTANT:   Kerry Hough. [employer]   (Present and scrubbed throughout the case, critical for assistance with exposure, retraction, instrumentation, and closure.)         ANESTHESIA: Spinal, 20cc Exparel, 50cc 0.25% Marcaine  EBL: 300cc  FLUID REPLACEMENT: 1600 crystalloid  TOURNIQUET TIME: none  Drains: None  Tranexamic Acid: 1gm IV, 2gm topical  COMPLICATIONS:  None         INDICATIONS FOR PROCEDURE: The patient has  RIGHT KNEE OSTEOARTHRITIS, Val deformities, XR shows bone on bone arthritis, lateral subluxation of tibia. Patient has failed all conservative measures including anti-inflammatory medicines, narcotics, attempts at  exercise and weight loss, cortisone injections and viscosupplementation.  Risks and benefits of surgery have been discussed, questions answered.   DESCRIPTION OF PROCEDURE: The patient identified by armband, received  IV antibiotics, in the holding area at [facility]. Patient taken to the operating room, appropriate anesthetic  monitors were attached, and Spinal anesthesia was  induced. Tourniquet  applied high to the operative thigh. Lateral post and foot positioner  applied to the table, the lower extremity was then prepped and draped  in usual  sterile fashion from the toes to the tourniquet. Time-out procedure was performed. We began the operation, with the knee flexed 120 degrees, by making the anterior midline incision starting at handbreadth above the patella going over the patella 1 cm medial to and 4 cm distal to the tibial tubercle. Small bleeders in the skin and the  subcutaneous tissue identified and cauterized. Transverse retinaculum was incised and reflected medially and a medial parapatellar arthrotomy was accomplished. the patella was everted and theprepatellar fat pad resected. The superficial medial collateral  ligament was then elevated from anterior to posterior along the proximal  flare of the tibia and anterior half of the menisci resected. The knee was hyperflexed exposing bone on bone arthritis. Peripheral and notch osteophytes as well as the cruciate ligaments were then resected. We continued to  work our way around posteriorly along the proximal  tibia, and externally  rotated the tibia subluxing it out from underneath the femur. A McHale  retractor was placed through the notch and a lateral Hohmann retractor  placed, and we then drilled through the proximal tibia in line with the  axis of the tibia followed by an intramedullary guide rod and 3-degree  posterior slope cutting guide. The tibial cutting guide, 3 degree posterior sloped, was pinned into place allowing resection of 8 mm of bone medially and 3 mm of bone laterally. Satisfied with the tibial resection, we then  entered the distal femur 2 mm anterior to the PCL origin with the  intramedullary guide rod and applied the distal femoral cutting guide  set at 9 mm, with 5 degrees of valgus. This was pinned along the  epicondylar axis. At this point, the distal femoral cut was accomplished without difficulty. We then sized for a #5R femoral component and pinned the guide in 0 degrees of external rotation. The chamfer cutting guide was pinned into place. The anterior,  posterior, and chamfer cuts were accomplished without difficulty followed by  the Attune RP box cutting guide and the box cut. We also removed posterior osteophytes from the posterior femoral condyles. At this  time, the knee was brought into full extension. We checked our  extension and flexion gaps and found them symmetric for a 5 mm bearing. Distracting in extension with a lamina spreader, the posterior horns of the menisci were removed, and Exparel, diluted to 60 cc, with 20cc NS, and 20cc 0.5% Marcaine,was injected into the capsule and synovium of the knee. The posterior patella cut was accomplished with the 9.5 mm Attune cutting guide, sized for a 63mm dome, and the fixation pegs drilled.The knee  was then once again hyperflexed exposing the proximal tibia. We sized for a # 6 tibial base plate, applied the smokestack and the conical reamer followed by the the Delta fin keel punch. We then hammered into place the Attune RP trial femoral component, drilled the lugs, inserted a  5 mm trial bearing, trial patellar button, and took the knee through range of motion from 0-130 degrees. No thumb pressure was required for patellar Tracking. At this point, the limb was wrapped with an Esmarch bandage and the tourniquet inflated to 350 mmHg. All trial components were removed, mating surfaces irrigated with pulse lavage, and dried with suction and sponges. 10 cc of the Exparel solution was applied to the cancellus bone of the patella distal femur and proximal tibia.  After waiting 1 minute, the bony surfaces were again, dried with sponges. A double batch of DePuy HV cement with 1500 mg of Zinacef was mixed and applied to all bony metallic mating surfaces except for the posterior condyles of the femur itself. In order, we hammered into place the tibial tray and removed excess cement, the femoral component and removed excess cement. The final Attune RP bearing  was inserted, and the knee brought to full extension with  compression.  The patellar button was clamped into place, and excess cement  removed. While the cement cured the wound was irrigated out with normal saline solution pulse lavage. Ligament stability and patellar tracking were checked and found to be excellent. The parapatellar arthrotomy was closed with  running #1 Vicryl suture. The subcutaneous tissue with 0 and 2-0 undyed  Vicryl suture, and the skin with running 3-0 SQ vicryl. A dressing of Xeroform,  4 x 4, dressing sponges, Webril, and Ace wrap applied. The patient  awakened, and  taken to recovery room without difficulty.   Kerin Salen 03/12/2018, 9:04 AM

## 2018-03-12 NOTE — Progress Notes (Signed)
Lab tech taking labs at this time.

## 2018-03-12 NOTE — Progress Notes (Signed)
Patient stating she is having sharp chest and back pains. Pt states "she has had this feeling before and it was gas and a pulled muscle" Vitals taken BP 140/50, heart rate 57, oxygen 98% with 2L [place]. EKG taken, awaiting interventions from MD.

## 2018-03-12 NOTE — Progress Notes (Signed)
PT Cancellation Note  Patient Details Name: Katherine Nixon MRN: [id number] DOB: 19-Jan-1946   Cancelled Treatment:    Reason Eval/Treat Not Completed: Medical issues which prohibited therapy Per notes and after speaking with RN, pt with new onset chest pain and awaiting lab results for cardiac clearance. Will hold until pt medically appropriate and follow up as schedule allows.   Leighton Ruff, PT, DPT  Acute Rehabilitation Services  Pager: [phone]  Rudean Hitt 03/12/2018, 3:11 PM

## 2018-03-12 NOTE — Anesthesia Postprocedure Evaluation (Signed)
Anesthesia Post Note  Patient: Katherine Nixon  Procedure(s) Performed: TOTAL KNEE ARTHROPLASTY (Right Knee)     Patient location during evaluation: PACU Anesthesia Type: Spinal and Regional Level of consciousness: awake and alert, oriented and patient cooperative Pain management: pain level controlled Vital Signs Assessment: post-procedure vital signs reviewed and stable Respiratory status: spontaneous breathing, nonlabored ventilation and respiratory function stable Cardiovascular status: blood pressure returned to baseline and stable Postop Assessment: patient able to bend at knees, no apparent nausea or vomiting and spinal receding Anesthetic complications: no    Last Vitals:  Vitals:   03/12/18 1151 03/12/18 1204  BP:  112/88  Pulse: (!) 53 (!) 54  Resp: 13 20  Temp: (!) 36.2 C 36.4 C  SpO2: 97% 98%    Last Pain:  Vitals:   03/12/18 1204  TempSrc: Oral  PainSc:                  Jamire Shabazz,E. Zhanna Melin

## 2018-03-12 NOTE — Progress Notes (Signed)
Left message with messaging service for Dr. Shaune Spittle office, awaiting response

## 2018-03-12 NOTE — Progress Notes (Signed)
Pt states that pain level is 3/10 after PRN medication.  POC updated with pt due to her stating wanting to have PT to begin working on her at this time. Pt requested to urinate due to new order and pt verbalized understanding.

## 2018-03-12 NOTE — Anesthesia Procedure Notes (Signed)
Spinal  End time: 03/12/2018 7:47 AM Staffing Anesthesiologist: Annye Asa, MD Performed: anesthesiologist  Preanesthetic Checklist Completed: patient identified, site marked, surgical consent, pre-op evaluation, timeout performed, IV checked, risks and benefits discussed and monitors and equipment checked Spinal Block Patient position: sitting Prep: site prepped and draped and DuraPrep Patient monitoring: heart rate, cardiac monitor, continuous pulse ox and blood pressure Approach: midline Location: L3-4 Injection technique: single-shot Needle Needle type: Pencan  Needle gauge: 24 G Needle length: 9 cm Additional Notes Pt identified in Operating room.  Monitors applied. Working IV access confirmed. Sterile prep, drape lumbar spine.  1% lido local L 3,4.  #24ga Pencan into clear CSF L 3,4.  12mg  0.75% Bupivacaine with dextrose injected with asp CSF beginning and end of injection.  Patient asymptomatic, VSS, no heme aspirated, tolerated well.  Jenita Seashore, MD

## 2018-03-12 NOTE — Anesthesia Procedure Notes (Signed)
Anesthesia Regional Block: Adductor canal block   Pre-Anesthetic Checklist: ,, timeout performed, Correct Patient, Correct Site, Correct Laterality, Correct Procedure, Correct Position, site marked, Risks and benefits discussed,  Surgical consent,  Pre-op evaluation,  At surgeon's request and post-op pain management  Laterality: Right and Lower  Prep: chloraprep       Needles:  Injection technique: Single-shot  Needle Type: Echogenic Needle     Needle Length: 9cm  Needle Gauge: 21     Additional Needles:   Procedures:,,,, ultrasound used (permanent image in chart),,,,  Narrative:  Start time: 03/12/2018 7:12 AM End time: 03/12/2018 7:20 AM Injection made incrementally with aspirations every 5 mL.  Performed by: Personally  Anesthesiologist: Annye Asa, MD  Additional Notes: Pt identified in Holding room.  Monitors applied. Working IV access confirmed. Sterile prep, drape R thigh.  #21ga ECHOgenic needle into adductor canal with US guidance.  20cc 0.75% Ropivacaine injected incrementally after negative test dose.  Patient asymptomatic, VSS, no heme aspirated, tolerated well.  Jenita Seashore, MD

## 2018-03-12 NOTE — Transfer of Care (Signed)
Immediate Anesthesia Transfer of Care Note  Patient: Katherine Nixon  Procedure(s) Performed: TOTAL KNEE ARTHROPLASTY (Right Knee)  Patient Location: PACU  Anesthesia Type:Spinal and MAC combined with regional for post-op pain  Level of Consciousness: awake, alert  and oriented  Airway & Oxygen Therapy: Patient Spontanous Breathing and Patient connected to face mask oxygen  Post-op Assessment: Report given to RN and Post -op Vital signs reviewed and stable  Post vital signs: Reviewed and stable  Last Vitals:  Vitals Value Taken Time  BP 122/52 03/12/2018  9:24 AM  Temp 36.3 C 03/12/2018  9:23 AM  Pulse 54 03/12/2018  9:27 AM  Resp 13 03/12/2018  9:27 AM  SpO2 100 % 03/12/2018  9:27 AM  Vitals shown include unvalidated device data.  Last Pain:  Vitals:   03/12/18 0923  TempSrc:   PainSc: (P) 0-No pain      Patients Stated Pain Goal: 3 (94/32/76 1470)  Complications: No apparent anesthesia complications

## 2018-03-12 NOTE — Consult Note (Addendum)
Medical Consultation   Katherine Nixon  CVE:[id number]  DOB: [birth year]  DOA: 03/12/2018  PCP: Hali Marry, MD    Requesting physician: Dr. Mayer Camel Reason for consultation: Chest pain syndrome    History of Present Illness: Katherine Nixon is an 72 y.o. female with a history of hypertension, anxiety, GERD, and history of osteoarthritis, Lupus, admitted on 03/12/2018 for right total knee arthroplasty, tolerating the procedure well.Hospitalist consultation was requested, for evaluation of acute chest pain since early this afternoon, sharp, radiating to the back.  The patient states that she had similar events about 1 year ago, and it was felt to be musculoskeletal.  In fact, she stated that a similar episode postoperatively about 35 years ago was also attributed to musculoskeletal issues.  At the time, she had a stress test performed, which yielded negative results.  The patient denies any diaphoresis, radiation to the left jaw or left arm.  Currently, she is chest pain-free.  The episode lasted only about a minute or 2.  She denies any dizziness or falls.  No syncope or presyncope.  She denies any shortness of breath, or exertional dyspnea.  She denies any cough, fever, chills, or hemoptysis.  She denies any nausea or vomiting or abdominal pain.  She denies any changes in her appetite, or foods.  She denies any leg swelling other than in the right knee, where she has the surgery.  Denies any calf pain.  She denies any headaches or vision changes.  She denies any hormonal products.  She is compliant with her medications.  She is not currently on daily aspirin.  Of note, the patient has been placed on Eliquis as of 03/13/2018.  No history of PE or DVT. Denies any recent long distance trips, new stressors, new medications, hormonal therapy, or herbal supplements.  She does not smoke, drinks alcohol or partake recreational drugs.  She denies any family history of cardiac disease.  The  patient up to this consultation, has not taken any nitroglycerin for evaluation of resolution of the symptoms.  Review of Systems:  As per HPI otherwise all other systems reviewed and are negative, including no dysuria or hematuria.  She does have urinary incontinence.   Past Medical History: Past Medical History:  Diagnosis Date  . Anxiety   . Arthritis   . Chronic diarrhea   . Complication of anesthesia    diff to wake up with gallbaldder surgery- no problems since   . Depression   . Dysrhythmia     states at times  . Headache   . Hypertension   . Neuromuscular disorder (HCC)    lupus  . Squamous cell carcinoma     Past Surgical History: Past Surgical History:  Procedure Laterality Date  . BREAST BIOPSY    . BREAST SURGERY     biopsies/ aspirations - neg  . CATARACT EXTRACTION     with lens implanted  . CHOLECYSTECTOMY    . COLONOSCOPY    . DILATION AND CURETTAGE OF UTERUS    . KNEE ARTHROSCOPY Bilateral   . LAPAROSCOPIC CHOLECYSTECTOMY    . NASAL SINUS SURGERY    . SHOULDER SURGERY Left 12/2014   Dr. Tamera Punt   . TOTAL HIP ARTHROPLASTY Right 08/31/2016  . TOTAL HIP ARTHROPLASTY Right 08/31/2016   Procedure: TOTAL HIP ARTHROPLASTY ANTERIOR APPROACH;  Surgeon: Frederik Pear, MD;  Location: [place];  Service: Orthopedics;  Laterality: Right;  .  VARICOSE VEIN SURGERY       Allergies:   Allergies  Allergen Reactions  . Gabapentin Other (See Comments)    Hallucination   . Nsaids Other (See Comments)    Due to liver enzyme issue (meloxicam is ok per patient)  . Tramadol Other (See Comments)    Hallucinations   . Pravastatin Other (See Comments)    Muscle cramping  . Sulfamethoxazole-Trimethoprim Other (See Comments)    Thrush in mouth     Social History: Social History   Socioeconomic History  . Marital status: Married    Spouse name: Jimmie  . Number of children: 4  . Years of education: 42  . Highest education level: Not on file  Occupational  History  . 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 Financial resource strain: Not on file  . Food insecurity:    Worry: Not on file    Inability: Not on file  . Transportation needs:    Medical: Not on file    Non-medical: Not on file  Tobacco Use  . Smoking status: Former Smoker    Types: Cigarettes    Last attempt to quit: 11/22/1987    Years since quitting: 30.3  . Smokeless tobacco: Never Used  Substance and Sexual Activity  . Alcohol use: No  . Drug use: No  . Sexual activity: Never  Lifestyle  . Physical activity:    Days per week: Not on file    Minutes per session: Not on file  . Stress: Not on file  Relationships  . Social connections:    Talks on phone: Not on file    Gets together: Not on file    Attends religious service: Not on file    Active member of club or organization: Not on file    Attends meetings of clubs or organizations: Not on file    Relationship status: Not on file  . Intimate partner violence:    Fear of current or ex partner: Not on file    Emotionally abused: Not on file    Physically abused: Not on file    Forced sexual activity: Not on file  Other Topics Concern  . Not on file  Social History Narrative  . Not on file       Family History: Family History  Problem Relation Age of Onset  . Heart disease Mother   . Heart attack Mother   . Hyperlipidemia Father   . Other Father        amputaiton  . Hyperlipidemia Brother   . Hypertension Brother     Family history reviewed and not pertinent    Physical Exam: Vitals:   03/12/18 1145 03/12/18 1151 03/12/18 1204 03/12/18 1300  BP: (!) 131/49  112/88 (!) 140/50  Pulse: (!) 52 (!) 53 (!) 54 (!) 56  Resp: 11 13 20 20   Temp:  (!) 97.2 F (36.2 C) 97.6 F (36.4 C)   TempSrc:   Oral   SpO2: 97% 97% 98% 100%    Constitutional: Appears calm,  alert and awake, oriented x3, not in any acute distress. Eyes: PERLA, EOMI, irises appear normal,  anicteric sclera,  ENMT: external ears and nose appear normal, normal hearing   Lips appears normal, oropharynx mucosa, tongue, posterior pharynx appear normal  Neck: neck appears normal, no masses, normal ROM, no thyromegaly, no JVD  CVS: S1-S2 clear, 2/6 systolic murmur rubs or gallops, no LE edema on the  left. On the R, is bandaged due to surgery , normal pedal pulses  Respiratory: clear to auscultation bilaterally, no wheezing, rales or rhonchi. Respiratory effort normal. No accessory muscle use.  Abdomen: soft nontender, nondistended, normal bowel sounds, no hepatosplenomegaly, no hernias  Musculoskeletal: no cyanosis, clubbing or edema noted bilaterally.  Joint/bones/muscle exam, strength, contractures or atrophy Neuro: Cranial nerves II-XII intact, strength, sensation, reflexes Psych: judgement and insight appear normal, stable mood and affect, mental status Skin: no rashes or lesions or ulcers, no induration or nodules, well healed surgical scars  Data reviewed:  I have personally reviewed following labs and imaging studies Labs:  CBC: No results for input(s): WBC, NEUTROABS, HGB, HCT, MCV, PLT in the last 168 hours.  Basic Metabolic Panel: No results for input(s): NA, K, CL, CO2, GLUCOSE, BUN, CREATININE, CALCIUM, MG, PHOS in the last 168 hours. GFR Estimated Creatinine Clearance: 61.7 mL/min (by C-G formula based on SCr of 0.89 mg/dL). Liver Function Tests: No results for input(s): AST, ALT, ALKPHOS, BILITOT, PROT, ALBUMIN in the last 168 hours. No results for input(s): LIPASE, AMYLASE in the last 168 hours. No results for input(s): AMMONIA in the last 168 hours. Coagulation profile No results for input(s): INR, PROTIME in the last 168 hours.  Cardiac Enzymes: No results for input(s): CKTOTAL, CKMB, CKMBINDEX, TROPONINI in the last 168 hours. BNP: Invalid input(s): POCBNP CBG: No results for input(s): GLUCAP in the last 168 hours. D-Dimer No results for input(s): DDIMER in  the last 72 hours. Hgb A1c No results for input(s): HGBA1C in the last 72 hours. Lipid Profile No results for input(s): CHOL, HDL, LDLCALC, TRIG, CHOLHDL, LDLDIRECT in the last 72 hours. Thyroid function studies No results for input(s): TSH, T4TOTAL, T3FREE, THYROIDAB in the last 72 hours.  Invalid input(s): FREET3 Anemia work up No results for input(s): VITAMINB12, FOLATE, FERRITIN, TIBC, IRON, RETICCTPCT in the last 72 hours. Urinalysis    Component Value Date/Time   COLORURINE YELLOW 03/01/2018 1117   APPEARANCEUR HAZY (A) 03/01/2018 1117   LABSPEC 1.016 03/01/2018 1117   PHURINE 5.0 03/01/2018 1117   GLUCOSEU NEGATIVE 03/01/2018 1117   HGBUR NEGATIVE 03/01/2018 1117   BILIRUBINUR NEGATIVE 03/01/2018 1117   BILIRUBINUR neg 03/25/2011 1050   KETONESUR NEGATIVE 03/01/2018 1117   PROTEINUR NEGATIVE 03/01/2018 1117   NITRITE NEGATIVE 03/01/2018 1117   LEUKOCYTESUR MODERATE (A) 03/01/2018 1117     Sepsis Labs Invalid input(s): PROCALCITONIN,  WBC,  LACTICIDVEN Microbiology No results found for this or any previous visit (from the past 240 hour(s)).     Inpatient Medications:   Scheduled Meds: . amLODipine  5 mg Oral Daily  . [START ON 03/13/2018] apixaban  2.5 mg Oral Q12H  . atorvastatin  20 mg Oral q1800  . docusate sodium  100 mg Oral BID  . DULoxetine  60 mg Oral Daily  . hydrochlorothiazide  25 mg Oral Daily  . pantoprazole  40 mg Oral Daily  . traZODone  100 mg Oral QHS   Continuous Infusions: . dextrose 5 % and 0.45 % NaCl with KCl 20 mEq/L 100 mL/hr at 03/12/18 1228  . methocarbamol (ROBAXIN)  IV    . tranexamic acid       Radiological Exams on Admission: No results found.  Impression/Recommendations Principal Problem:   Degenerative arthritis of left knee Active Problems:   Essential hypertension, benign   GERD (gastroesophageal reflux disease)   Hyperlipemia   Lupus (systemic lupus erythematosus) (HCC)   ANA positive   Irritable bowel syndrome  with both constipation and diarrhea   Primary osteoarthritis of right knee   Anxiety state   Chest pain syndrome   Chest pain syndrome, cardiac versus musculoskeletal vs anxiety HEART score 3, although Tn is pending  EKG Sinus rhythm with Premature atrial complexes with Abberant conduction.Nonspecific ST abnormality No family history of cardiac disease     Risk factors include HTN, Lupus, HLD,, and in addition has history of anxiety, chronic pain syndrome   Admit to Telemetry/ Observation Chest pain order set Cycle troponins Cycle EKG   ASA x1 , O2 and NTG as needed GI cocktail Check Lipid panel  Hb A1C 2 D echo If chest pain recurs, will consult Cardiology, possible stress test  and follow as OP  Continue with Robaxin prn and Anxiolytics as per Ortho   Hypertension BP  140/50  Pulse   56  Continue home anti-hypertensive medications   Hyperlipidemia Continue home statins Check lipid panel   GERD Continue PPI and GI cocktail    Presumed Asymptomatic UTI suggested by leukocytes in urine, denies dysuria, hematuria, frequency, urgency, but does have urine incontinence  UA +  leukocytes in urine. WBC is  6.7  .    F/u urine culture Patient is on  Ceftriaxone IV by Ortho for p/op  Follow CBC in am  IVF    Other medical issues as per admitting team. Thank you for this consultation.  Our [facility] hospitalist team will follow the patient with you.    Sharene Butters PA-C [facility] 03/12/2018, 2:43 PM

## 2018-03-12 NOTE — Progress Notes (Signed)
Patient is stating that chest pain is easing off, will continue to monitor.

## 2018-03-12 NOTE — Progress Notes (Signed)
Talked with the RN in the [place] who stated that Dr. Mayer Camel and PA were both in surgery. I stated the patient is having chest pain to RN who transferred message to Dr. Mayer Camel. Dr. Mayer Camel gave verbal order to get hospitalist consult, which RN cannot put in verbally. I called the OR again about the patient having chest pain, and that I needed hospitalist consult and needs to be doctor to doctor. I contacted [facility] admissions twice about needing hospitalist to call Dr. Mayer Camel in [place] in order to get consult and check on patient. Awaiting interventions.  Talked with Dr. Shawn Route, orders were given for chest pain.   Talked with Dr. Mayer Camel and updated him on patient status.

## 2018-03-12 NOTE — Progress Notes (Signed)
Orthopedic Tech Progress Note Patient Details:  Katherine Nixon Mar 09, 1946 [id number]  Ortho Devices Type of Ortho Device: Bone foam zero knee Ortho Device/Splint Location: rle Ortho Device/Splint Interventions: Application   Post Interventions Patient Tolerated: Well Instructions Provided: Care of device   Hildred Priest 03/12/2018, 12:24 PM

## 2018-03-12 NOTE — Discharge Instructions (Addendum)
INSTRUCTIONS AFTER JOINT REPLACEMENT  ° °o Remove items at home which could result in a fall. This includes throw rugs or furniture in walking pathways °o ICE to the affected joint every three hours while awake for 30 minutes at a time, for at least the first 3-5 days, and then as needed for pain and swelling.  Continue to use ice for pain and swelling. You may notice swelling that will progress down to the foot and ankle.  This is normal after surgery.  Elevate your leg when you are not up walking on it.   °o Continue to use the breathing machine you got in the hospital (incentive spirometer) which will help keep your temperature down.  It is common for your temperature to cycle up and down following surgery, especially at night when you are not up moving around and exerting yourself.  The breathing machine keeps your lungs expanded and your temperature down. ° ° °DIET:  As you were doing prior to hospitalization, we recommend a well-balanced diet. ° °DRESSING / WOUND CARE / SHOWERING ° °Keep the surgical dressing until follow up.  The dressing is water proof, so you can shower without any extra covering.  IF THE DRESSING FALLS OFF or the wound gets wet inside, change the dressing with sterile gauze.  Please use good hand washing techniques before changing the dressing.  Do not use any lotions or creams on the incision until instructed by your surgeon.   ° °ACTIVITY ° °o Increase activity slowly as tolerated, but follow the weight bearing instructions below.   °o No driving for 6 weeks or until further direction given by your physician.  You cannot drive while taking narcotics.  °o No lifting or carrying greater than 10 lbs. until further directed by your surgeon. °o Avoid periods of inactivity such as sitting longer than an hour when not asleep. This helps prevent blood clots.  °o You may return to work once you are authorized by your doctor.  ° ° ° °WEIGHT BEARING  ° °Weight bearing as tolerated with assist  device (walker, cane, etc) as directed, use it as long as suggested by your surgeon or therapist, typically at least 4-6 weeks. ° ° °EXERCISES ° °Results after joint replacement surgery are often greatly improved when you follow the exercise, range of motion and muscle strengthening exercises prescribed by your doctor. Safety measures are also important to protect the joint from further injury. Any time any of these exercises cause you to have increased pain or swelling, decrease what you are doing until you are comfortable again and then slowly increase them. If you have problems or questions, call your caregiver or physical therapist for advice.  ° °Rehabilitation is important following a joint replacement. After just a few days of immobilization, the muscles of the leg can become weakened and shrink (atrophy).  These exercises are designed to build up the tone and strength of the thigh and leg muscles and to improve motion. Often times heat used for twenty to thirty minutes before working out will loosen up your tissues and help with improving the range of motion but do not use heat for the first two weeks following surgery (sometimes heat can increase post-operative swelling).  ° °These exercises can be done on a training (exercise) mat, on the floor, on a table or on a bed. Use whatever works the best and is most comfortable for you.    Use music or television while you are exercising so that   the exercises are a pleasant break in your day. This will make your life better with the exercises acting as a break in your routine that you can look forward to.   Perform all exercises about fifteen times, three times per day or as directed.  You should exercise both the operative leg and the other leg as well. ° °Exercises include: °  °• Quad Sets - Tighten up the muscle on the front of the thigh (Quad) and hold for 5-10 seconds.   °• Straight Leg Raises - With your knee straight (if you were given a brace, keep it on),  lift the leg to 60 degrees, hold for 3 seconds, and slowly lower the leg.  Perform this exercise against resistance later as your leg gets stronger.  °• Leg Slides: Lying on your back, slowly slide your foot toward your buttocks, bending your knee up off the floor (only go as far as is comfortable). Then slowly slide your foot back down until your leg is flat on the floor again.  °• Angel Wings: Lying on your back spread your legs to the side as far apart as you can without causing discomfort.  °• Hamstring Strength:  Lying on your back, push your heel against the floor with your leg straight by tightening up the muscles of your buttocks.  Repeat, but this time bend your knee to a comfortable angle, and push your heel against the floor.  You may put a pillow under the heel to make it more comfortable if necessary.  ° °A rehabilitation program following joint replacement surgery can speed recovery and prevent re-injury in the future due to weakened muscles. Contact your doctor or a physical therapist for more information on knee rehabilitation.  ° ° °CONSTIPATION ° °Constipation is defined medically as fewer than three stools per week and severe constipation as less than one stool per week.  Even if you have a regular bowel pattern at home, your normal regimen is likely to be disrupted due to multiple reasons following surgery.  Combination of anesthesia, postoperative narcotics, change in appetite and fluid intake all can affect your bowels.  ° °YOU MUST use at least one of the following options; they are listed in order of increasing strength to get the job done.  They are all available over the counter, and you may need to use some, POSSIBLY even all of these options:   ° °Drink plenty of fluids (prune juice may be helpful) and high fiber foods °Colace 100 mg by mouth twice a day  °Senokot for constipation as directed and as needed Dulcolax (bisacodyl), take with full glass of water  °Miralax (polyethylene glycol)  once or twice a day as needed. ° °If you have tried all these things and are unable to have a bowel movement in the first 3-4 days after surgery call either your surgeon or your primary doctor.   ° °If you experience loose stools or diarrhea, hold the medications until you stool forms back up.  If your symptoms do not get better within 1 week or if they get worse, check with your doctor.  If you experience "the worst abdominal pain ever" or develop nausea or vomiting, please contact the office immediately for further recommendations for treatment. ° ° °ITCHING:  If you experience itching with your medications, try taking only a single pain pill, or even half a pain pill at a time.  You can also use Benadryl over the counter for itching or also to   help with sleep.  ° °TED HOSE STOCKINGS:  Use stockings on both legs until for at least 2 weeks or as directed by physician office. They may be removed at night for sleeping. ° °MEDICATIONS:  See your medication summary on the “After Visit Summary” that nursing will review with you.  You may have some home medications which will be placed on hold until you complete the course of blood thinner medication.  It is important for you to complete the blood thinner medication as prescribed. ° °PRECAUTIONS:  If you experience chest pain or shortness of breath - call 911 immediately for transfer to the hospital emergency department.  ° °If you develop a fever greater that 101 F, purulent drainage from wound, increased redness or drainage from wound, foul odor from the wound/dressing, or calf pain - CONTACT YOUR SURGEON.   °                                                °FOLLOW-UP APPOINTMENTS:  If you do not already have a post-op appointment, please call the office for an appointment to be seen by your surgeon.  Guidelines for how soon to be seen are listed in your “After Visit Summary”, but are typically between 1-4 weeks after surgery. ° °OTHER INSTRUCTIONS:  ° °Knee  Replacement:  Do not place pillow under knee, focus on keeping the knee straight while resting. CPM instructions: 0-90 degrees, 2 hours in the morning, 2 hours in the afternoon, and 2 hours in the evening. Place foam block, curve side up under heel at all times except when in CPM or when walking.  DO NOT modify, tear, cut, or change the foam block in any way. ° °MAKE SURE YOU:  °• Understand these instructions.  °• Get help right away if you are not doing well or get worse.  ° ° °Thank you for letting us be a part of your medical care team.  It is a privilege we respect greatly.  We hope these instructions will help you stay on track for a fast and full recovery!  ° ° °Information on my medicine - ELIQUIS® (apixaban) ° °Why was Eliquis® prescribed for you? °Eliquis® was prescribed for you to reduce the risk of blood clots forming after orthopedic surgery.   ° °What do You need to know about Eliquis®? °Take your Eliquis® TWICE DAILY - one tablet in the morning and one tablet in the evening with or without food.  It would be best to take the dose about the same time each day. ° °If you have difficulty swallowing the tablet whole please discuss with your pharmacist how to take the medication safely. ° °Take Eliquis® exactly as prescribed by your doctor and DO NOT stop taking Eliquis® without talking to the doctor who prescribed the medication.  Stopping without other medication to take the place of Eliquis® may increase your risk of developing a clot. ° °After discharge, you should have regular check-up appointments with your healthcare provider that is prescribing your Eliquis®. ° °What do you do if you miss a dose? °If a dose of ELIQUIS® is not taken at the scheduled time, take it as soon as possible on the same day and twice-daily administration should be resumed.  The dose should not be doubled to make up for a missed dose.  Do not take more   than one tablet of ELIQUIS at the same time. ° °Important Safety  Information °A possible side effect of Eliquis® is bleeding. You should call your healthcare provider right away if you experience any of the following: °? Bleeding from an injury or your nose that does not stop. °? Unusual colored urine (red or dark brown) or unusual colored stools (red or black). °? Unusual bruising for unknown reasons. °? A serious fall or if you hit your head (even if there is no bleeding). ° °Some medicines may interact with Eliquis® and might increase your risk of bleeding or clotting while on Eliquis®. To help avoid this, consult your healthcare provider or pharmacist prior to using any new prescription or non-prescription medications, including herbals, vitamins, non-steroidal anti-inflammatory drugs (NSAIDs) and supplements. ° °This website has more information on Eliquis® (apixaban): http://www.eliquis.com/eliquis/home ° °

## 2018-03-13 ENCOUNTER — Encounter (HOSPITAL_COMMUNITY): Payer: Self-pay | Admitting: Orthopedic Surgery

## 2018-03-13 DIAGNOSIS — M1712 Unilateral primary osteoarthritis, left knee: Secondary | ICD-10-CM

## 2018-03-13 LAB — BASIC METABOLIC PANEL
Anion gap: 8 (ref 5–15)
BUN: 9 mg/dL (ref 6–20)
CALCIUM: 9.2 mg/dL (ref 8.9–10.3)
CO2: 29 mmol/L (ref 22–32)
Chloride: 101 mmol/L (ref 101–111)
Creatinine, Ser: 0.94 mg/dL (ref 0.44–1.00)
GFR calc Af Amer: >60 mL/min (ref >60)
GFR, EST NON AFRICAN AMERICAN: 60 mL/min — AB (ref >60)
Glucose, Bld: 125 mg/dL — ABNORMAL HIGH (ref 65–99)
Potassium: 3.6 mmol/L (ref 3.5–5.1)
SODIUM: 138 mmol/L (ref 135–145)

## 2018-03-13 LAB — CBC
HCT: 36.6 % (ref 36.0–46.0)
Hemoglobin: 11.5 g/dL — ABNORMAL LOW (ref 12.0–15.0)
MCH: 30.3 pg (ref 26.0–34.0)
MCHC: 31.4 g/dL (ref 30.0–36.0)
MCV: 96.6 fL (ref 78.0–100.0)
PLATELETS: 185 10*3/uL (ref 150–400)
RBC: 3.79 MIL/uL — AB (ref 3.87–5.11)
RDW: 14.8 % (ref 11.5–15.5)
WBC: 7 10*3/uL (ref 4.0–10.5)

## 2018-03-13 NOTE — Progress Notes (Signed)
PATIENT ID: Katherine Nixon  MRN: [id number]  DOB/AGE:  [birth year] / 72 y.o.  1 Day Post-Op Procedure(s) (LRB): TOTAL KNEE ARTHROPLASTY (Right)    PROGRESS NOTE Subjective: Patient is alert, oriented, no Nausea, no Vomiting, yes passing gas. Taking PO well. Denies SOB, Chest or Calf Pain. Using Incentive Spirometer, PAS in place. Ambulate patient has not been out of bed yet secondary to chest pain that required a medicine consult yesterday and he laid physical therapy.  Chest pain is resolved patient is fairly certain it was secondary to past history of GERD.  Today she is also complaining of SI joint pain which she is also had in the past., Patient reports pain as 6/10 .    Objective: Vital signs in last 24 hours: Vitals:   03/12/18 1204 03/12/18 1300 03/12/18 2003 03/13/18 0617  BP: 112/88 (Abnormal) 140/50 (Abnormal) 103/44 (Abnormal) 99/44  Pulse: (Abnormal) 54 (Abnormal) 56 68 73  Resp: 20 20 18 18   Temp: 97.6 F (36.4 C)  98.5 F (36.9 C) 99 F (37.2 C)  TempSrc: Oral  Oral Oral  SpO2: 98% 100% 94% (Abnormal) 87%      Intake/Output from previous day: I/O last 3 completed shifts: In: 2423.3 [P.O.:480; I.V.:1833.3; IV Piggyback:110] Out: 300 [Urine:200; Blood:100]   Intake/Output this shift: No intake/output data recorded.   LABORATORY DATA: Recent Labs    03/13/18 0630  WBC 7.0  HGB 11.5*  HCT 36.6  PLT 185    Examination: Neurologically intact ABD soft Neurovascular intact Sensation intact distally Intact pulses distally Dorsiflexion/Plantar flexion intact Incision: dressing C/D/I No cellulitis present Compartment soft}  Assessment:   1 Day Post-Op Procedure(s) (LRB): TOTAL KNEE ARTHROPLASTY (Right) ADDITIONAL DIAGNOSIS: Expected Acute Blood Loss Anemia, Hypertension Anticipated LOS equal to or greater than 2 midnights due to - Age 72 and older with one or more of the following:  - Obesity  - Expected need for hospital services (PT, OT, Nursing)  required for safe  discharge  - Anticipated need for postoperative skilled nursing care or inpatient rehab  - obesity, deconditioning, chest pain that delayed physical therapy.    Plan: PT/OT WBAT, AROM and PROM  DVT Prophylaxis:  SCDx72hrs, ASA 325 mg BID x 2 weeks DISCHARGE PLAN: Home, probably tomorrow if patient passes physical therapy. DISCHARGE NEEDS: HHPT, Walker and 3-in-1 comode seat     Kerin Salen 03/13/2018, 7:17 AM

## 2018-03-13 NOTE — Progress Notes (Signed)
Chaplain visited with the PT during rounds.  The PT had requested prayer and also communion.  Chaplain informed the PT that the [employer] are in the building several days a week and that she would be on the list.  PT requested prayer for leg pain.

## 2018-03-13 NOTE — Evaluation (Signed)
Physical Therapy Evaluation Patient Details Name: Katherine Nixon MRN: [id number] DOB: 10-Oct-1946 Today's Date: 03/13/2018   History of Present Illness  72 y.o. female admitted on 03/12/18 for elective R TKA.  WBAT post op.  Pt with other significant PMH of lupus, HTN, dysrhythmia, chronic diarrhea, R THA (2017), and L shoulder surgery (2016).  Clinical Impression  Pt is POD #1 and is very limited by pain.  She was only able to walk a short distance around the room for me this AM.  We did initiate HEP program and knee education.  She will likely progress well enough to d/c home with her husband's assist.  She will likely need BID tx today and tomorrow.   PT to follow acutely for deficits listed below.       Follow Up Recommendations Follow surgeon's recommendation for DC plan and follow-up therapies;Supervision for mobility/OOB    Equipment Recommendations  None recommended by PT    Recommendations for Other Services   NA    Precautions / Restrictions Precautions Precautions: Knee Precaution Booklet Issued: Yes (comment) Precaution Comments: knee exercise handout given , first page reviewed. Restrictions RLE Weight Bearing: Weight bearing as tolerated      Mobility  Bed Mobility Overal bed mobility: Needs Assistance Bed Mobility: Sit to Supine       Sit to supine: Min assist   General bed mobility comments: Min assist to help lift her leg back into bed from sitting.   Transfers Overall transfer level: Needs assistance Equipment used: Rolling walker (2 wheeled) Transfers: Sit to/from Stand Sit to Stand: Min assist         General transfer comment: Heavy min assist from low recliner chair and lighter min assist from higher BSC.  Verbal cues for safe hand placement during transitions.   Ambulation/Gait Ambulation/Gait assistance: Min assist Ambulation Distance (Feet): 15 Feet Assistive device: Rolling walker (2 wheeled) Gait Pattern/deviations: Step-to  pattern;Antalgic Gait velocity: decreased   General Gait Details: Moderately antalgic gait pattern, min assist to stabilize trunk when walking, verbal cues for upright posture and to stay inside of RW.  Pt moaning in pain throughout short distance gait to the bathroom and back to the bed.           Balance Overall balance assessment: Needs assistance Sitting-balance support: Feet supported;No upper extremity supported Sitting balance-Leahy Scale: Good     Standing balance support: Bilateral upper extremity supported Standing balance-Leahy Scale: Poor                               Pertinent Vitals/Pain Pain Assessment: Faces Faces Pain Scale: Hurts whole lot Pain Location: right knee (was just taken out of bone foam) Pain Descriptors / Indicators: Guarding;Grimacing Pain Intervention(s): Limited activity within patient's tolerance;Monitored during session;Repositioned;Ice applied    Home Living Family/patient expects to be discharged to:: Private residence Living Arrangements: Spouse/significant other Available Help at Discharge: Family;Available 24 hours/day Type of Home: House Home Access: Stairs to enter Entrance Stairs-Rails: Right Entrance Stairs-Number of Steps: 3 Home Layout: One level Home Equipment: Walker - 2 wheels;Cane - single point;Bedside commode;Shower seat;Wheelchair - manual      Prior Function Level of Independence: Needs assistance   Gait / Transfers Assistance Needed: recent history of near falls           Hand Dominance   Dominant Hand: Right    Extremity/Trunk Assessment   Upper Extremity Assessment Upper Extremity Assessment: Defer to  OT evaluation    Lower Extremity Assessment Lower Extremity Assessment: RLE deficits/detail RLE Deficits / Details: right leg with normal post op pain and weakness, ankle at least 3/5, knee 2/5, hip flexion 2/5 RLE: Unable to fully assess due to pain RLE Sensation: WNL    Cervical /  Trunk Assessment Cervical / Trunk Assessment: Normal  Communication   Communication: No difficulties  Cognition Arousal/Alertness: Awake/alert Behavior During Therapy: WFL for tasks assessed/performed Overall Cognitive Status: Within Functional Limits for tasks assessed                                           Exercises Total Joint Exercises Ankle Circles/Pumps: AROM;Both;20 reps Quad Sets: AROM;10 reps;Right Towel Squeeze: AROM;Both;10 reps Heel Slides: AAROM;Right;10 reps Goniometric ROM: 10-45   Assessment/Plan    PT Assessment Patient needs continued PT services  PT Problem List Decreased strength;Decreased range of motion;Decreased activity tolerance;Decreased balance;Decreased mobility;Decreased knowledge of use of DME;Decreased knowledge of precautions;Pain       PT Treatment Interventions DME instruction;Gait training;Stair training;Functional mobility training;Therapeutic activities;Therapeutic exercise;Balance training;Patient/family education;Manual techniques;Modalities    PT Goals (Current goals can be found in the Care Plan section)  Acute Rehab PT Goals Patient Stated Goal: to increase her steadiness and not fall as much PT Goal Formulation: With patient Time For Goal Achievement: 03/20/18 Potential to Achieve Goals: Good    Frequency 7X/week           AM-PAC PT "6 Clicks" Daily Activity  Outcome Measure Difficulty turning over in bed (including adjusting bedclothes, sheets and blankets)?: Unable Difficulty moving from lying on back to sitting on the side of the bed? : Unable Difficulty sitting down on and standing up from a chair with arms (e.g., wheelchair, bedside commode, etc,.)?: Unable Help needed moving to and from a bed to chair (including a wheelchair)?: A Little Help needed walking in hospital room?: A Little Help needed climbing 3-5 steps with a railing? : A Lot 6 Click Score: 11    End of Session   Activity Tolerance:  Patient limited by pain Patient left: in bed;with call bell/phone within reach;with family/visitor present   PT Visit Diagnosis: Muscle weakness (generalized) (M62.81);Difficulty in walking, not elsewhere classified (R26.2);Pain Pain - Right/Left: Right Pain - part of body: Knee    Time: 2725-3664 PT Time Calculation (min) (ACUTE ONLY): 29 min   Charges:          Wells Guiles B. Arlis Everly, PT, DPT [phone]   PT Evaluation $PT Eval Moderate Complexity: 1 Mod PT Treatments $Gait Training: 8-22 mins   03/13/2018, 2:03 PM

## 2018-03-13 NOTE — Progress Notes (Signed)
Patient ID: Katherine Nixon, female   DOB: [birth year], 72 y.o.   MRN: [id number]  PROGRESS NOTE    Mikea Quadros  PIR:[id number] DOB: [birth year] DOA: 03/12/2018  PCP: Hali Marry, MD   Brief Narrative:  72 y.o. female with history of SLE, HTN, IBS who presented with right knee pain, underwent right knee arthroplasty 4/22, post-operatively developed acute onset of severe chest pain with radiation to her back.   Assessment & Plan:   Principal Problem: Chest pain - Troponin levels negative - No chest pain this am - No acute ischemic changes on 12 lead EKG - Continue asa daily  Active Problems: Right knee osteoarthritis - S/P right knee arthroplasty 4/22 - PT eval    Essential hypertension, benign - Continue norvasc and hctz    Hyperlipemia - Continue statin therapy   DVT prophylaxis: Apixaban Code Status: full code  Family Communication: no family at bedside Disposition Plan: home in am depending on PT eval    Consultants:   PT  Orthopedic surgery   Procedures:   Total knee arthroplasty 03/12/2018  Antimicrobials:   None    Subjective: No overnight events.  Objective: Vitals:   03/12/18 1204 03/12/18 1300 03/12/18 2003 03/13/18 0617  BP: 112/88 (!) 140/50 (!) 103/44 (!) 99/44  Pulse: (!) 54 (!) 56 68 73  Resp: 20 20 18 18   Temp: 97.6 F (36.4 C)  98.5 F (36.9 C) 99 F (37.2 C)  TempSrc: Oral  Oral Oral  SpO2: 98% 100% 94% (!) 87%    Intake/Output Summary (Last 24 hours) at 03/13/2018 1210 Last data filed at 03/13/2018 1038 Gross per 24 hour  Intake 1523.33 ml  Output 300 ml  Net 1223.33 ml   There were no vitals filed for this visit.  Examination:  General exam: Appears calm and comfortable  Respiratory system: Clear to auscultation. Respiratory effort normal. Cardiovascular system: S1 & S2 heard, RRR.  Gastrointestinal system: Abdomen is nondistended, soft and nontender. No organomegaly or masses felt. Normal bowel sounds  heard. Central nervous system: Alert and oriented. No focal neurological deficits. Extremities: Symmetric 5 x 5 power. ACE dressing applied to right knee Skin: No rashes, lesions or ulcers Psychiatry: Judgement and insight appear normal. Mood & affect appropriate.   Data Reviewed: I have personally reviewed following labs and imaging studies  CBC: Recent Labs  Lab 03/13/18 0630  WBC 7.0  HGB 11.5*  HCT 36.6  MCV 96.6  PLT 630   Basic Metabolic Panel: Recent Labs  Lab 03/13/18 0630  NA 138  K 3.6  CL 101  CO2 29  GLUCOSE 125*  BUN 9  CREATININE 0.94  CALCIUM 9.2   GFR: Estimated Creatinine Clearance: 58.4 mL/min (by C-G formula based on SCr of 0.94 mg/dL). Liver Function Tests: No results for input(s): AST, ALT, ALKPHOS, BILITOT, PROT, ALBUMIN in the last 168 hours. No results for input(s): LIPASE, AMYLASE in the last 168 hours. No results for input(s): AMMONIA in the last 168 hours. Coagulation Profile: No results for input(s): INR, PROTIME in the last 168 hours. Cardiac Enzymes: Recent Labs  Lab 03/12/18 1437 03/12/18 2015  TROPONINI <0.03 <0.03   BNP (last 3 results) No results for input(s): PROBNP in the last 8760 hours. HbA1C: Recent Labs    03/12/18 1437  HGBA1C 5.6   CBG: No results for input(s): GLUCAP in the last 168 hours. Lipid Profile: Recent Labs    03/12/18 1437  CHOL 138  HDL 65  LDLCALC 65  TRIG  40  CHOLHDL 2.1   Thyroid Function Tests: No results for input(s): TSH, T4TOTAL, FREET4, T3FREE, THYROIDAB in the last 72 hours. Anemia Panel: No results for input(s): VITAMINB12, FOLATE, FERRITIN, TIBC, IRON, RETICCTPCT in the last 72 hours. Urine analysis:    Component Value Date/Time   COLORURINE YELLOW 03/01/2018 1117   APPEARANCEUR HAZY (A) 03/01/2018 1117   LABSPEC 1.016 03/01/2018 1117   PHURINE 5.0 03/01/2018 1117   GLUCOSEU NEGATIVE 03/01/2018 1117   HGBUR NEGATIVE 03/01/2018 1117   [place] 03/01/2018 1117    BILIRUBINUR neg 03/25/2011 1050   KETONESUR NEGATIVE 03/01/2018 1117   PROTEINUR NEGATIVE 03/01/2018 1117   NITRITE NEGATIVE 03/01/2018 1117   LEUKOCYTESUR MODERATE (A) 03/01/2018 1117   Sepsis Labs: @LABRCNTIP (procalcitonin:4,lacticidven:4)   )No results found for this or any previous visit (from the past 240 hour(s)).    Radiology Studies: Dg Chest 1 View Result Date: 03/12/2018 No active disease.    Scheduled Meds: . amLODipine  5 mg Oral Daily  . apixaban  2.5 mg Oral Q12H  . aspirin EC  81 mg Oral Daily  . atorvastatin  20 mg Oral q1800  . docusate sodium  100 mg Oral BID  . DULoxetine  60 mg Oral Daily  . hydrochlorothiazide  25 mg Oral Daily  . pantoprazole  40 mg Oral Daily  . traZODone  100 mg Oral QHS   Continuous Infusions: . dextrose 5 % and 0.45 % NaCl with KCl 20 mEq/L 100 mL/hr at 03/12/18 2321  . methocarbamol (ROBAXIN)  IV       LOS: 1 day    Time spent: 25 minutes Greater than 50% of the time spent on counseling and coordinating the care.   Leisa Lenz, MD [facility] Pager [phone]  If 7PM-7AM, please contact night-coverage [email or web address] Password [facility] 03/13/2018, 12:10 PM

## 2018-03-13 NOTE — Progress Notes (Signed)
Physical Therapy Treatment Patient Details Name: Katherine Nixon MRN: [id number] DOB: 1946/05/11 Today's Date: 03/13/2018    History of Present Illness 72 y.o. female admitted on 03/12/18 for elective R TKA.  WBAT post op.  Pt with other significant PMH of lupus, HTN, dysrhythmia, chronic diarrhea, R THA (2017), and L shoulder surgery (2016).    PT Comments    Pt is POD #1 and this is her second session.  Despite continued pain, she was able to walk into the hallway with her husband following with the chair to encourage increased safety and increased gait distance.  Pt is continuing to progress slowly yet steadily.  She progressed further into her HEP exercises. PT will continue to follow acutely.    Follow Up Recommendations  Follow surgeon's recommendation for DC plan and follow-up therapies;Supervision for mobility/OOB     Equipment Recommendations  None recommended by PT    Recommendations for Other Services   NA     Precautions / Restrictions Precautions Precautions: Knee Precaution Booklet Issued: Yes (comment) Precaution Comments: knee exercise handout given , knee precaution reviewed Restrictions RLE Weight Bearing: Weight bearing as tolerated    Mobility  Bed Mobility Overal bed mobility: Needs Assistance Bed Mobility: Supine to Sit     Supine to sit: Min assist     General bed mobility comments: Min assist to help progress right leg to EOB.    Transfers Overall transfer level: Needs assistance Equipment used: Rolling walker (2 wheeled) Transfers: Sit to/from Stand Sit to Stand: Min assist;From elevated surface         General transfer comment: Min assist from elevated bed to get to standing.   Ambulation/Gait Ambulation/Gait assistance: Min assist;+2 safety/equipment Ambulation Distance (Feet): 55 Feet Assistive device: Rolling walker (2 wheeled) Gait Pattern/deviations: Step-to pattern;Antalgic Gait velocity: decreased Gait velocity interpretation:  <1.8 ft/sec, indicate of risk for recurrent falls General Gait Details: Moderately antalgic gait pattern, verbal cues for LE sequencing, safe RW use and upright posture.  Husband followed [place] with the chair to encourage increased gait distance.            Balance Overall balance assessment: Needs assistance Sitting-balance support: Feet supported;No upper extremity supported Sitting balance-Leahy Scale: Good     Standing balance support: Bilateral upper extremity supported Standing balance-Leahy Scale: Poor                              Cognition Arousal/Alertness: Awake/alert Behavior During Therapy: WFL for tasks assessed/performed Overall Cognitive Status: Within Functional Limits for tasks assessed                                        Exercises Total Joint Exercises Short Arc QuadSinclair Nixon;Right;10 reps Hip ABduction/ADduction: AAROM;Right;10 reps Straight Leg Raises: AAROM;Right;10 reps        Pertinent Vitals/Pain Pain Assessment: 0-10 Pain Score: 9  Pain Location: right knee Pain Descriptors / Indicators: Guarding;Grimacing Pain Intervention(s): Limited activity within patient's tolerance;Monitored during session;Repositioned;Ice applied           PT Goals (current goals can now be found in the care plan section) Acute Rehab PT Goals Patient Stated Goal: to increase her steadiness and not fall as much Progress towards PT goals: Progressing toward goals    Frequency    7X/week      PT Plan Current plan remains  appropriate       AM-PAC PT "6 Clicks" Daily Activity  Outcome Measure  Difficulty turning over in bed (including adjusting bedclothes, sheets and blankets)?: Unable Difficulty moving from lying on back to sitting on the side of the bed? : Unable Difficulty sitting down on and standing up from a chair with arms (e.g., wheelchair, bedside commode, etc,.)?: Unable Help needed moving to and from a bed to chair  (including a wheelchair)?: A Little Help needed walking in hospital room?: A Little Help needed climbing 3-5 steps with a railing? : A Lot 6 Click Score: 11    End of Session   Activity Tolerance: Patient limited by pain Patient left: with call bell/phone within reach;with family/visitor present;in chair   PT Visit Diagnosis: Muscle weakness (generalized) (M62.81);Difficulty in walking, not elsewhere classified (R26.2);Pain Pain - Right/Left: Right Pain - part of body: Knee     Time: 5396-7289 PT Time Calculation (min) (ACUTE ONLY): 39 min  Charges:  $Gait Training: 8-22 mins $Therapeutic Exercise: 8-22 mins $Therapeutic Activity: 8-22 mins          Katherine Nixon, [place], DPT [phone]            03/13/2018, 10:43 PM

## 2018-03-13 NOTE — Plan of Care (Signed)
  Problem: Pain Managment: Goal: General experience of comfort will improve Outcome: Progressing   

## 2018-03-14 LAB — CBC
HCT: 34.3 % — ABNORMAL LOW (ref 36.0–46.0)
HEMOGLOBIN: 11.1 g/dL — AB (ref 12.0–15.0)
MCH: 31.1 pg (ref 26.0–34.0)
MCHC: 32.4 g/dL (ref 30.0–36.0)
MCV: 96.1 fL (ref 78.0–100.0)
PLATELETS: 173 10*3/uL (ref 150–400)
RBC: 3.57 MIL/uL — AB (ref 3.87–5.11)
RDW: 15.3 % (ref 11.5–15.5)
WBC: 9.7 10*3/uL (ref 4.0–10.5)

## 2018-03-14 LAB — URINE CULTURE: Culture: NO GROWTH

## 2018-03-14 NOTE — Plan of Care (Signed)
  Problem: Education: Goal: Knowledge of General Education information will improve Outcome: Progressing   Problem: Clinical Measurements: Goal: Ability to maintain clinical measurements within normal limits will improve Outcome: Progressing   Problem: Pain Management: Goal: Pain level will decrease with appropriate interventions Outcome: Progressing   

## 2018-03-14 NOTE — Progress Notes (Signed)
Physical Therapy Treatment Patient Details Name: Katherine Nixon MRN: [id number] DOB: 02/06/1946 Today's Date: 03/14/2018    History of Present Illness 72 y.o. female admitted on 03/12/18 for elective R TKA.  WBAT post op.  Pt with other significant PMH of lupus, HTN, dysrhythmia, chronic diarrhea, R THA (2017), and L shoulder surgery (2016).  Pt with post op chest pain, however troponins and work up negative thus far.  She is now on a heart monitor.    PT Comments    Pt is slowly progressing with gait and mobility and was able to practice stairs with heavy min assist.  She is wavering on if she would like to go home this PM because she is scared her pain will not be controlled enough at home.  She would benefit from one more session of therapy if MD feels she can stay another night.  PT to follow acutely until d/c confirmed.     Follow Up Recommendations  Follow surgeon's recommendation for DC plan and follow-up therapies;Supervision for mobility/OOB     Equipment Recommendations  None recommended by PT    Recommendations for Other Services   NA     Precautions / Restrictions Precautions Precautions: Knee Precaution Booklet Issued: Yes (comment) Precaution Comments: knee exercise handout given , knee precaution reviewed Restrictions RLE Weight Bearing: Weight bearing as tolerated    Mobility  Bed Mobility Overal bed mobility: Needs Assistance Bed Mobility: Supine to Sit     Supine to sit: Min assist     General bed mobility comments: Min assist to help progress right leg to EOB.    Transfers Overall transfer level: Needs assistance Equipment used: Rolling walker (2 wheeled) Transfers: Sit to/from Stand Sit to Stand: Min assist;From elevated surface         General transfer comment: Min assist from elelvated bed.  She reports her bed is high at home.  Ambulation/Gait Ambulation/Gait assistance: Min guard Ambulation Distance (Feet): 120 Feet Assistive device:  Rolling walker (2 wheeled) Gait Pattern/deviations: Step-to pattern;Antalgic Gait velocity: 0.29 ft/sec Gait velocity interpretation: <1.8 ft/sec, indicate of risk for recurrent falls General Gait Details: Moderately antalgic gait pattern with flexed trunk. Very slow gait speed.    Stairs Stairs: Yes   Stair Management: Two rails;One rail Right;Forwards;Sideways Number of Stairs: 2(x2) General stair comments: practiced once forward with both rails (however pt doesn't think she can reach both comfortably at home) and once sideways which was much more awkard. Heavy min assist to support trunk.  husband present for stair education. Verbal cues for LE sequencing.        Balance Overall balance assessment: Needs assistance Sitting-balance support: Feet supported;Bilateral upper extremity supported Sitting balance-Leahy Scale: Good     Standing balance support: Bilateral upper extremity supported Standing balance-Leahy Scale: Poor                              Cognition Arousal/Alertness: Awake/alert Behavior During Therapy: WFL for tasks assessed/performed Overall Cognitive Status: Within Functional Limits for tasks assessed                                        Exercises Total Joint Exercises Ankle Circles/Pumps: AROM;Both;20 reps Quad Sets: AROM;10 reps;Right Towel Squeeze: AROM;Both;10 reps Heel Slides: AAROM;Right;10 reps Long Arc Quad: AAROM;Right;10 reps Knee Flexion: AROM;AAROM;5 reps;Seated Goniometric ROM: 10-73  Pertinent Vitals/Pain Pain Assessment: 0-10 Pain Score: 8  Pain Location: right knee Pain Descriptors / Indicators: Guarding;Grimacing Pain Intervention(s): Limited activity within patient's tolerance;Monitored during session;Repositioned;Patient requesting pain meds-RN notified;Ice applied           PT Goals (current goals can now be found in the care plan section) Acute Rehab PT Goals Patient Stated Goal: to  increase her steadiness and not fall as much Progress towards PT goals: Progressing toward goals    Frequency    7X/week      PT Plan Current plan remains appropriate       AM-PAC PT "6 Clicks" Daily Activity  Outcome Measure  Difficulty turning over in bed (including adjusting bedclothes, sheets and blankets)?: Unable Difficulty moving from lying on back to sitting on the side of the bed? : Unable Difficulty sitting down on and standing up from a chair with arms (e.g., wheelchair, bedside commode, etc,.)?: Unable Help needed moving to and from a bed to chair (including a wheelchair)?: A Little Help needed walking in hospital room?: A Little Help needed climbing 3-5 steps with a railing? : A Little 6 Click Score: 12    End of Session   Activity Tolerance: Patient limited by pain Patient left: in chair;with call bell/phone within reach;with family/visitor present Nurse Communication: Patient requests pain meds PT Visit Diagnosis: Muscle weakness (generalized) (M62.81);Difficulty in walking, not elsewhere classified (R26.2);Pain Pain - Right/Left: Right Pain - part of body: Knee     Time: 7564-3329 PT Time Calculation (min) (ACUTE ONLY): 44 min  Charges:  $Gait Training: 23-37 mins $Therapeutic Exercise: 8-22 mins          Tatiyana Foucher B. [place], [place], DPT [phone]             03/14/2018, 4:18 PM

## 2018-03-14 NOTE — Care Management Note (Signed)
Case Management Note  Patient Details  Name: Katherine Nixon MRN: [id number] Date of Birth: 01/03/1946  Subjective/Objective:   72 yr old female s/p right total knee arthroplasty.                  Action/Plan: Case manager spoke with patient concerning discharge plan and DME. Patient was preoperatively setup with [facility], no changes. She will have family support at discharge.    Expected Discharge Date:  03/14/18               Expected Discharge Plan:  [place]  In-House Referral:  NA  Discharge planning Services  CM Consult  Post Acute Care Choice:  Home Health Choice offered to:  Patient  DME Arranged:  3-N-1, Walker rolling DME Agency:  [employer]  HH Arranged:  PT HH Agency:  [place]  Status of Service:  Completed, signed off  If discussed at [employer] of Stay Meetings, dates discussed:    Additional Comments:  Ninfa Meeker, RN 03/14/2018, 11:36 AM

## 2018-03-14 NOTE — Care Plan (Signed)
Patient seen on rounds on 4/22 and today by PA. She is set up with [facility] for [place] for [place] visits and then will transition to [place] at [facility] following her appointment with Dr. Mayer Camel on  03/21/18 @ 930. She has all needed equipment at home from prior surgery. Her husband will be at home to assist. I have discussed this plan with the patient and she is in agreement with it.   Please contact Ladell Heads, [place] with questions or if this plan needs to be changed.   Thank you

## 2018-03-14 NOTE — Progress Notes (Signed)
Physical Therapy Treatment Patient Details Name: Katherine Nixon MRN: [id number] DOB: 1946-01-18 Today's Date: 03/14/2018    History of Present Illness 72 y.o. female admitted on 03/12/18 for elective R TKA.  WBAT post op.  Pt with other significant PMH of lupus, HTN, dysrhythmia, chronic diarrhea, R THA (2017), and L shoulder surgery (2016).  Pt with post op chest pain, however troponins and work up negative thus far.  She is now on a heart monitor.    PT Comments    Pt continues to be very slow to move and needs assist to get up from sitting.  Her husband feels comfortable with these duties at home as he has been her caregiver in the past after orthopedic surgery.  Pt was determined to walk further and wants to practice steps this PM in hopes to go home later today.     Follow Up Recommendations  Follow surgeon's recommendation for DC plan and follow-up therapies;Supervision for mobility/OOB     Equipment Recommendations  None recommended by PT    Recommendations for Other Services   NA     Precautions / Restrictions Precautions Precautions: Knee Precaution Booklet Issued: Yes (comment) Precaution Comments: knee exercise handout given , knee precaution reviewed Restrictions RLE Weight Bearing: Weight bearing as tolerated    Mobility  Bed Mobility Overal bed mobility: Needs Assistance Bed Mobility: Supine to Sit     Supine to sit: Min assist     General bed mobility comments: Min assist to help progress right leg to EOB.    Transfers Overall transfer level: Needs assistance Equipment used: Rolling walker (2 wheeled) Transfers: Sit to/from Stand Sit to Stand: Min assist         General transfer comment: Heavy min assist from regluar height bed.  Pt unable to push up unassisted.   Ambulation/Gait Ambulation/Gait assistance: Min assist Ambulation Distance (Feet): 100 Feet Assistive device: Rolling walker (2 wheeled) Gait Pattern/deviations: Step-to  pattern;Antalgic Gait velocity: 0.29 ft/sec Gait velocity interpretation: <1.8 ft/sec, indicate of risk for recurrent falls General Gait Details: Moderately antalgic gait pattern with flexed trunk. Very slow gait speed.           Balance Overall balance assessment: Needs assistance Sitting-balance support: Feet supported;Bilateral upper extremity supported Sitting balance-Leahy Scale: Good     Standing balance support: Bilateral upper extremity supported Standing balance-Leahy Scale: Poor                              Cognition Arousal/Alertness: Awake/alert Behavior During Therapy: WFL for tasks assessed/performed Overall Cognitive Status: Within Functional Limits for tasks assessed                                        Exercises Total Joint Exercises Ankle Circles/Pumps: AROM;Both;20 reps Quad Sets: AROM;10 reps;Right Towel Squeeze: AROM;Both;10 reps Heel Slides: AAROM;Right;10 reps Goniometric ROM: 12-65        Pertinent Vitals/Pain Pain Assessment: 0-10 Pain Score: 6  Pain Location: right knee Pain Descriptors / Indicators: Guarding;Grimacing Pain Intervention(s): Limited activity within patient's tolerance;Monitored during session;Repositioned;Ice applied           PT Goals (current goals can now be found in the care plan section) Acute Rehab PT Goals Patient Stated Goal: to increase her steadiness and not fall as much Progress towards PT goals: Progressing toward goals    Frequency  7X/week      PT Plan Current plan remains appropriate       AM-PAC PT "6 Clicks" Daily Activity  Outcome Measure  Difficulty turning over in bed (including adjusting bedclothes, sheets and blankets)?: Unable Difficulty moving from lying on back to sitting on the side of the bed? : Unable Difficulty sitting down on and standing up from a chair with arms (e.g., wheelchair, bedside commode, etc,.)?: Unable Help needed moving to and from  a bed to chair (including a wheelchair)?: A Little Help needed walking in hospital room?: A Little Help needed climbing 3-5 steps with a railing? : A Little 6 Click Score: 12    End of Session   Activity Tolerance: Patient limited by pain Patient left: in chair;with call bell/phone within reach;with family/visitor present   PT Visit Diagnosis: Muscle weakness (generalized) (M62.81);Difficulty in walking, not elsewhere classified (R26.2);Pain Pain - Right/Left: Right Pain - part of body: Knee     Time: 1191-4782 PT Time Calculation (min) (ACUTE ONLY): 42 min  Charges:  $Gait Training: 23-37 mins $Therapeutic Exercise: 8-22 mins          Adayah Arocho B. [place], [place], DPT [phone]            03/14/2018, 2:35 PM

## 2018-03-14 NOTE — Progress Notes (Addendum)
PATIENT ID: Katherine Nixon  MRN: [id number]  DOB/AGE:  04/11/1946 / 72 y.o.  2 Days Post-Op Procedure(s) (LRB): TOTAL KNEE ARTHROPLASTY (Right)    PROGRESS NOTE Subjective: Patient is alert, oriented, no Nausea, no Vomiting, yes passing gas. Taking PO well. Denies SOB, Chest or Calf Pain. Using Incentive Spirometer, PAS in place. Ambulate WBAT with pt walking 55 ft, Patient reports pain as moderate to severe .    Objective: Vital signs in last 24 hours: Vitals:   03/13/18 1221 03/13/18 2032 03/14/18 0423 03/14/18 0810  BP: (!) 100/34 (!) 117/41 134/71   Pulse: 60 (!) 50 74   Resp:  18 18   Temp: 99.3 F (37.4 C) 99.3 F (37.4 C) (!) 100.5 F (38.1 C) 99.5 F (37.5 C)  TempSrc: Oral Oral Oral Oral  SpO2: (!) 87% 91% 90% 95%      Intake/Output from previous day: I/O last 3 completed shifts: In: 1000 [P.O.:1000] Out: 300 [Urine:300]   Intake/Output this shift: No intake/output data recorded.   LABORATORY DATA: Recent Labs    03/13/18 0630 03/14/18 0623  WBC 7.0 9.7  HGB 11.5* 11.1*  HCT 36.6 34.3*  PLT 185 173  NA 138  --   K 3.6  --   CL 101  --   CO2 29  --   BUN 9  --   CREATININE 0.94  --   GLUCOSE 125*  --   CALCIUM 9.2  --     Examination: Neurologically intact Neurovascular intact Sensation intact distally Intact pulses distally Dorsiflexion/Plantar flexion intact Incision: dressing C/D/I No cellulitis present Compartment soft}  Assessment:   2 Days Post-Op Procedure(s) (LRB): TOTAL KNEE ARTHROPLASTY (Right) ADDITIONAL DIAGNOSIS: Expected Acute Blood Loss Anemia, Hypertension  Anticipated LOS equal to or greater than 2 midnights due to - Age 72 and older with one or more of the following:             - Obesity             - Expected need for hospital services (PT, OT, Nursing) required for safe   discharge             - Anticipated need for postoperative skilled nursing care or inpatient rehab             - obesity, deconditioning, chest pain  that delayed physical therapy.    Plan: PT/OT WBAT, AROM and PROM  DVT Prophylaxis:  SCDx72hrs, eliquis 2.5 mg BID x 2 weeks DISCHARGE PLAN: Home DISCHARGE NEEDS: HHPT, Walker and 3-in-1 comode seat     Joanell Rising 03/14/2018, 8:45 AM

## 2018-03-14 NOTE — Progress Notes (Signed)
Patient ID: Katherine Nixon, female   DOB: 1946-07-12, 72 y.o.   MRN: [id number]  PROGRESS NOTE    Kyllie Pettijohn  FHL:[id number] DOB: [birth year] DOA: 03/12/2018  PCP: Hali Marry, MD   Brief Narrative:  72 y.o. female with history of SLE, HTN, IBS who presented with right knee pain, underwent right knee arthroplasty 4/22, post-operatively developed acute onset of severe chest pain with radiation to her back.   Assessment & Plan:   Principal Problem: Chest pain - Troponin levels negative - No acute ischemic changes on 12 lead EKG - Continue aspirin daily   Active Problems: Right knee osteoarthritis - S/P right knee arthroplasty 4/22 - PT eval - HH PT ordered     Essential hypertension, benign - Continue norvasc and hctz    Hyperlipemia - Continue Lipitor 20 mg at bedtime    DVT prophylaxis: Apixaban Code Status: full code  Family Communication: no family at the bedside  Disposition Plan: home in am if pain improves and if afebrile    Consultants:   PT  Orthopedic surgery   Procedures:   Total knee arthroplasty 03/12/2018  Antimicrobials:   None    Subjective: Spike fever overnight.  Objective: Vitals:   03/13/18 1221 03/13/18 2032 03/14/18 0423 03/14/18 0810  BP: (!) 100/34 (!) 117/41 134/71   Pulse: 60 (!) 50 74   Resp:  18 18   Temp: 99.3 F (37.4 C) 99.3 F (37.4 C) (!) 100.5 F (38.1 C) 99.5 F (37.5 C)  TempSrc: Oral Oral Oral Oral  SpO2: (!) 87% 91% 90% 95%    Intake/Output Summary (Last 24 hours) at 03/14/2018 3734 Last data filed at 03/13/2018 2259 Gross per 24 hour  Intake 600 ml  Output 300 ml  Net 300 ml   There were no vitals filed for this visit.  Physical Exam  Constitutional: Appears well-developed and well-nourished. No distress.  CVS: RRR, S1/S2 + Pulmonary: Effort and breath sounds normal, no stridor, rhonchi, wheezes, rales.  Abdominal: Soft. BS +,  no distension, tenderness, rebound or guarding.    Musculoskeletal: Pain in the right leg, palpable pulses  Lymphadenopathy: No lymphadenopathy noted, cervical, inguinal. Neuro: Alert. Normal reflexes, muscle tone coordination. No cranial nerve deficit. Skin: Skin is warm and dry. No rash noted. Not diaphoretic. No erythema. No pallor.  Psychiatric: Normal mood and affect. Behavior, judgment, thought content normal.     Data Reviewed: I have personally reviewed following labs and imaging studies  CBC: Recent Labs  Lab 03/13/18 0630 03/14/18 0623  WBC 7.0 9.7  HGB 11.5* 11.1*  HCT 36.6 34.3*  MCV 96.6 96.1  PLT 185 287   Basic Metabolic Panel: Recent Labs  Lab 03/13/18 0630  NA 138  K 3.6  CL 101  CO2 29  GLUCOSE 125*  BUN 9  CREATININE 0.94  CALCIUM 9.2   GFR: Estimated Creatinine Clearance: 58.4 mL/min (by C-G formula based on SCr of 0.94 mg/dL). Liver Function Tests: No results for input(s): AST, ALT, ALKPHOS, BILITOT, PROT, ALBUMIN in the last 168 hours. No results for input(s): LIPASE, AMYLASE in the last 168 hours. No results for input(s): AMMONIA in the last 168 hours. Coagulation Profile: No results for input(s): INR, PROTIME in the last 168 hours. Cardiac Enzymes: Recent Labs  Lab 03/12/18 1437 03/12/18 2015  TROPONINI <0.03 <0.03   BNP (last 3 results) No results for input(s): PROBNP in the last 8760 hours. HbA1C: Recent Labs    03/12/18 1437  HGBA1C 5.6  CBG: No results for input(s): GLUCAP in the last 168 hours. Lipid Profile: Recent Labs    03/12/18 1437  CHOL 138  HDL 65  LDLCALC 65  TRIG 40  CHOLHDL 2.1   Thyroid Function Tests: No results for input(s): TSH, T4TOTAL, FREET4, T3FREE, THYROIDAB in the last 72 hours. Anemia Panel: No results for input(s): VITAMINB12, FOLATE, FERRITIN, TIBC, IRON, RETICCTPCT in the last 72 hours. Urine analysis:    Component Value Date/Time   COLORURINE YELLOW 03/01/2018 1117   APPEARANCEUR HAZY (A) 03/01/2018 1117   LABSPEC 1.016 03/01/2018  1117   PHURINE 5.0 03/01/2018 1117   GLUCOSEU NEGATIVE 03/01/2018 1117   HGBUR NEGATIVE 03/01/2018 1117   [place] 03/01/2018 1117   BILIRUBINUR neg 03/25/2011 1050   KETONESUR NEGATIVE 03/01/2018 1117   PROTEINUR NEGATIVE 03/01/2018 1117   NITRITE NEGATIVE 03/01/2018 1117   LEUKOCYTESUR MODERATE (A) 03/01/2018 1117   Sepsis Labs: @LABRCNTIP (procalcitonin:4,lacticidven:4)   )No results found for this or any previous visit (from the past 240 hour(s)).    Radiology Studies: Dg Chest 1 View Result Date: 03/12/2018 No active disease.    Scheduled Meds: . amLODipine  5 mg Oral Daily  . apixaban  2.5 mg Oral Q12H  . aspirin EC  81 mg Oral Daily  . atorvastatin  20 mg Oral q1800  . docusate sodium  100 mg Oral BID  . DULoxetine  60 mg Oral Daily  . hydrochlorothiazide  25 mg Oral Daily  . pantoprazole  40 mg Oral Daily  . traZODone  100 mg Oral QHS   Continuous Infusions: . dextrose 5 % and 0.45 % NaCl with KCl 20 mEq/L Stopped (03/13/18 2300)  . methocarbamol (ROBAXIN)  IV       LOS: 2 days    Time spent: 25 minutes Greater than 50% of the time spent on counseling and coordinating the care.   Leisa Lenz, MD [facility] Pager [phone]  If 7PM-7AM, please contact night-coverage [email or web address] Password [email or web address] 03/14/2018, 9:09 AM

## 2018-03-15 LAB — CBC
HCT: 32.8 % — ABNORMAL LOW (ref 36.0–46.0)
Hemoglobin: 10.5 g/dL — ABNORMAL LOW (ref 12.0–15.0)
MCH: 30.3 pg (ref 26.0–34.0)
MCHC: 32 g/dL (ref 30.0–36.0)
MCV: 94.8 fL (ref 78.0–100.0)
PLATELETS: 167 10*3/uL (ref 150–400)
RBC: 3.46 MIL/uL — ABNORMAL LOW (ref 3.87–5.11)
RDW: 15 % (ref 11.5–15.5)
WBC: 9.3 10*3/uL (ref 4.0–10.5)

## 2018-03-15 NOTE — Discharge Summary (Signed)
Physician Discharge Summary  Katherine Nixon JJK:[id number] DOB: 1946/01/16 DOA: 03/12/2018  PCP: Hali Marry, MD  Admit date: 03/12/2018 Discharge date: 03/15/2018  Recommendations for Outpatient Follow-up:  1. Ortho prescribed pain med on dc and apixaban  Discharge Diagnoses:  Principal Problem:   Degenerative arthritis of left knee Active Problems:   Essential hypertension, benign   GERD (gastroesophageal reflux disease)   Hyperlipemia   Lupus (systemic lupus erythematosus) (HCC)   ANA positive   Irritable bowel syndrome with both constipation and diarrhea   Primary osteoarthritis of right knee   Anxiety state   Chest pain syndrome    Discharge Condition: stable   Diet recommendation: as tolerated   History of present illness:  72 y.o.femalewith history of SLE, HTN, IBS who presented with right knee pain, underwent right knee arthroplasty 4/22, post-operatively developed acute onset of severe chest pain with radiation to her back.   Hospital Course:    Assessment & Plan:   Principal Problem: Chest pain - Troponin levels negative - No acute ischemic changes on 12 lead EKG  Active Problems: Right knee osteoarthritis - S/P right knee arthroplasty 4/22 - PT eval - HH PT ordered  - Ortho prescribed apixaban    Essential hypertension, benign - Continue norvasc and hctz    Hyperlipemia - Continue Lipitor 20 mg at bedtime    DVT prophylaxis: Apixaban Code Status: full code  Family Communication: no family at the bedside     Consultants:   PT  Orthopedic surgery   Procedures:   Total knee arthroplasty 03/12/2018  Antimicrobials:   None      Signed:  Leisa Lenz, MD  [facility] 03/15/2018, 11:48 AM  Pager #: [phone]  Time spent in minutes:45 minutes   Discharge Exam: Vitals:   03/14/18 1924 03/15/18 0448  BP: (!) 117/53 (!) 101/41  Pulse: (!) 53 67  Resp:    Temp: 98.2 F (36.8 C) 98.4 F (36.9 C)   SpO2: 93% (!) 86%   Vitals:   03/14/18 0810 03/14/18 1352 03/14/18 1924 03/15/18 0448  BP:  (!) 117/50 (!) 117/53 (!) 101/41  Pulse:  68 (!) 53 67  Resp:      Temp: 99.5 F (37.5 C) 99 F (37.2 C) 98.2 F (36.8 C) 98.4 F (36.9 C)  TempSrc: Oral Oral Oral Oral  SpO2: 95% 91% 93% (!) 86%    General: Pt is alert, follows commands appropriately, not in acute distress Cardiovascular: Regular rate and rhythm, S1/S2 + Respiratory: Clear to auscultation bilaterally, no wheezing, no crackles, no rhonchi Abdominal: Soft, non tender, non distended, bowel sounds +, no guarding Extremities: no cyanosis, pulses palpable bilaterally DP and PT Neuro: Grossly nonfocal  Discharge Instructions  Discharge Instructions    Call MD / Call 911   Complete by:  As directed    If you experience chest pain or shortness of breath, CALL 911 and be transported to the hospital emergency room.  If you develope a fever above 101 F, pus (white drainage) or increased drainage or redness at the wound, or calf pain, call your surgeon's office.   Call MD for:  persistant nausea and vomiting   Complete by:  As directed    Call MD for:  redness, tenderness, or signs of infection (pain, swelling, redness, odor or green/yellow discharge around incision site)   Complete by:  As directed    Call MD for:  severe uncontrolled pain   Complete by:  As directed    Constipation  Prevention   Complete by:  As directed    Drink plenty of fluids.  Prune juice may be helpful.  You may use a stool softener, such as Colace (over the counter) 100 mg twice a day.  Use MiraLax (over the counter) for constipation as needed.   Diet - low sodium heart healthy   Complete by:  As directed    Diet - low sodium heart healthy   Complete by:  As directed    Driving restrictions   Complete by:  As directed    No driving for 2 weeks   Increase activity slowly   Complete by:  As directed    Increase activity slowly as tolerated   Complete  by:  As directed    Patient may shower   Complete by:  As directed    You may shower without a dressing once there is no drainage.  Do not wash over the wound.  If drainage remains, cover wound with plastic wrap and then shower.   Weight bearing as tolerated   Complete by:  As directed      Allergies as of 03/15/2018      Reactions   Gabapentin Other (See Comments)   Hallucination   Nsaids Other (See Comments)   Due to liver enzyme issue (meloxicam is ok per patient)   Tramadol Other (See Comments)   Hallucinations   Pravastatin Other (See Comments)   Muscle cramping   Sulfamethoxazole-trimethoprim Other (See Comments)   Thrush in mouth      Medication List    STOP taking these medications   Ibuprofen 200 MG Caps   meloxicam 15 MG tablet Commonly known as:  MOBIC     TAKE these medications   amLODipine 5 MG tablet Commonly known as:  NORVASC TAKE 1 TABLET DAILY What changed:    how much to take  how to take this  when to take this   apixaban 2.5 MG Tabs tablet Commonly known as:  ELIQUIS Take 1 tablet (2.5 mg total) by mouth 2 (two) times daily.   atorvastatin 20 MG tablet Commonly known as:  LIPITOR TAKE 1 TABLET DAILY What changed:    how much to take  how to take this  when to take this   BIOTIN PO Take 2 tablets by mouth daily.   DULoxetine 60 MG capsule Commonly known as:  CYMBALTA Take 1 capsule (60 mg total) by mouth daily.   hydrochlorothiazide 25 MG tablet Commonly known as:  HYDRODIURIL TAKE 1 TABLET DAILY What changed:    how much to take  how to take this  when to take this   imiquimod 5 % cream Commonly known as:  ALDARA Apply topically 3 (three) times a week.   multivitamin with minerals Tabs tablet Take 2 tablets by mouth daily.   oxyCODONE-acetaminophen 5-325 MG tablet Commonly known as:  PERCOCET/ROXICET Take 1 tablet by mouth every 4 (four) hours as needed for severe pain.   polyethylene glycol packet Commonly  known as:  MIRALAX / GLYCOLAX Take 17 g by mouth daily as needed for mild constipation.   tiZANidine 2 MG tablet Commonly known as:  ZANAFLEX Take 1 tablet (2 mg total) by mouth every 6 (six) hours as needed.   traZODone 100 MG tablet Commonly known as:  DESYREL Take 1 tablet (100 mg total) by mouth at bedtime.            Durable Medical Equipment  (From admission, onward)  Start     Ordered   03/14/18 0908  For home use only DME Walker  Once    Question:  Patient needs a walker to treat with the following condition  Answer:  Weakness   03/14/18 0907   03/14/18 0908  For home use only DME 3 n 1  Once     03/14/18 4332   03/12/18 1202  DME Walker rolling  Once    Question:  Patient needs a walker to treat with the following condition  Answer:  Status post right knee replacement   03/12/18 1201   03/12/18 1202  DME 3 n 1  Once     03/12/18 1201       Discharge Care Instructions  (From admission, onward)        Start     Ordered   03/14/18 0000  Weight bearing as tolerated     03/14/18 0850     Follow-up Information    Frederik Pear, MD In 2 weeks.   Specialty:  Orthopedic Surgery Contact information: [place] [address] [phone]        Care, [facility] Follow up.   Specialty:  [place] Why:  A representative from [facility] will contact you to arrange start date and time for your therapy. Contact information: [place] [address] [phone]            The results of significant diagnostics from this hospitalization (including imaging, microbiology, ancillary and laboratory) are listed below for reference.    Significant Diagnostic Studies: Dg Chest 1 View  Result Date: 03/12/2018 CLINICAL DATA:  Chest pain. EXAM: CHEST  1 VIEW COMPARISON:  Chest x-ray dated June 22, 2016. FINDINGS: The heart size and mediastinal contours are within normal limits. Normal pulmonary vascularity. No  focal consolidation, pleural effusion, or pneumothorax. Unchanged scarring in the lingula. No acute osseous abnormality. IMPRESSION: No active disease. Electronically Signed   By: Titus Dubin M.D.   On: 03/12/2018 20:45    Microbiology: Recent Results (from the past 240 hour(s))  Culture, Urine     Status: None   Collection Time: 03/13/18  1:37 PM  Result Value Ref Range Status   Specimen Description URINE, CLEAN CATCH  Final   Special Requests NONE  Final   Culture   Final    NO GROWTH Performed at [facility], [address], [place], [place] [address]    Report Status 03/14/2018 FINAL  Final     Labs: Basic Metabolic Panel: Recent Labs  Lab 03/13/18 0630  NA 138  K 3.6  CL 101  CO2 29  GLUCOSE 125*  BUN 9  CREATININE 0.94  CALCIUM 9.2   Liver Function Tests: No results for input(s): AST, ALT, ALKPHOS, BILITOT, PROT, ALBUMIN in the last 168 hours. No results for input(s): LIPASE, AMYLASE in the last 168 hours. No results for input(s): AMMONIA in the last 168 hours. CBC: Recent Labs  Lab 03/13/18 0630 03/14/18 0623 03/15/18 0627  WBC 7.0 9.7 9.3  HGB 11.5* 11.1* 10.5*  HCT 36.6 34.3* 32.8*  MCV 96.6 96.1 94.8  PLT 185 173 167   Cardiac Enzymes: Recent Labs  Lab 03/12/18 1437 03/12/18 2015  TROPONINI <0.03 <0.03   BNP: BNP (last 3 results) No results for input(s): BNP in the last 8760 hours.  ProBNP (last 3 results) No results for input(s): PROBNP in the last 8760 hours.  CBG: No results for input(s):  GLUCAP in the last 168 hours.

## 2018-03-15 NOTE — Plan of Care (Signed)
  Problem: Education: Goal: Knowledge of General Education information will improve Outcome: Progressing   Problem: Health Behavior/Discharge Planning: Goal: Ability to manage health-related needs will improve Outcome: Progressing   Problem: Clinical Measurements: Goal: Ability to maintain clinical measurements within normal limits will improve Outcome: Progressing Goal: Will remain free from infection Outcome: Progressing Goal: Diagnostic test results will improve Outcome: Progressing Goal: Respiratory complications will improve Outcome: Progressing Goal: Cardiovascular complication will be avoided Outcome: Progressing   Problem: Activity: Goal: Risk for activity intolerance will decrease Outcome: Progressing   Problem: Nutrition: Goal: Adequate nutrition will be maintained Outcome: Progressing   Problem: Coping: Goal: Level of anxiety will decrease Outcome: Progressing   Problem: Elimination: Goal: Will not experience complications related to bowel motility Outcome: Progressing Goal: Will not experience complications related to urinary retention Outcome: Progressing   Problem: Pain Managment: Goal: General experience of comfort will improve Outcome: Progressing   Problem: Safety: Goal: Ability to remain free from injury will improve Outcome: Progressing   Problem: Skin Integrity: Goal: Risk for impaired skin integrity will decrease Outcome: Progressing   Problem: Education: Goal: Knowledge of the prescribed therapeutic regimen will improve Outcome: Progressing   Problem: Activity: Goal: Ability to avoid complications of mobility impairment will improve Outcome: Progressing Goal: Range of joint motion will improve Outcome: Progressing   Problem: Clinical Measurements: Goal: Postoperative complications will be avoided or minimized Outcome: Progressing   Problem: Pain Management: Goal: Pain level will decrease with appropriate interventions Outcome:  Progressing   Problem: Skin Integrity: Goal: Signs of wound healing will improve Outcome: Progressing   Problem: Spiritual Needs Goal: Ability to function at adequate level Outcome: Progressing

## 2018-03-15 NOTE — Progress Notes (Signed)
PATIENT ID: Sheriden Archibeque  MRN: [id number]  DOB/AGE:  04/01/1946 / 72 y.o.  3 Days Post-Op Procedure(s) (LRB): TOTAL KNEE ARTHROPLASTY (Right)    PROGRESS NOTE Subjective: Patient is alert, oriented, no Nausea, no Vomiting, yes passing gas. Taking PO well. Denies SOB, Chest or Calf Pain. Using Incentive Spirometer, PAS in place. Ambulate 120', Patient reports pain as 7/10 .    Objective: Vital signs in last 24 hours: Vitals:   03/14/18 0810 03/14/18 1352 03/14/18 1924 03/15/18 0448  BP:  (Abnormal) 117/50 (Abnormal) 117/53 (Abnormal) 101/41  Pulse:  68 (Abnormal) 53 67  Resp:      Temp: 99.5 F (37.5 C) 99 F (37.2 C) 98.2 F (36.8 C) 98.4 F (36.9 C)  TempSrc: Oral Oral Oral Oral  SpO2: 95% 91% 93% (Abnormal) 86%      Intake/Output from previous day: I/O last 3 completed shifts: In: 480 [P.O.:480] Out: -    Intake/Output this shift: No intake/output data recorded.   LABORATORY DATA: Recent Labs    03/13/18 0630 03/14/18 0623 03/15/18 0627  WBC 7.0 9.7 9.3  HGB 11.5* 11.1* 10.5*  HCT 36.6 34.3* 32.8*  PLT 185 173 167  NA 138  --   --   K 3.6  --   --   CL 101  --   --   CO2 29  --   --   BUN 9  --   --   CREATININE 0.94  --   --   GLUCOSE 125*  --   --   CALCIUM 9.2  --   --     Examination: Neurologically intact ABD soft Neurovascular intact Sensation intact distally Intact pulses distally Dorsiflexion/Plantar flexion intact Incision: scant drainage No cellulitis present Compartment soft}  Assessment:   3 Days Post-Op Procedure(s) (LRB): TOTAL KNEE ARTHROPLASTY (Right) ADDITIONAL DIAGNOSIS: Expected Acute Blood Loss Anemia, chronic pain syndrome Anticipated LOS equal to or greater than 2 midnights due to - Age 72 and older with one or more of the following:  - Obesity  - Expected need for hospital services (PT, OT, Nursing) required for safe  discharge   - Active co-morbidities: Chronic pain requiring opiods   Plan: PT/OT WBAT, AROM and  PROM  DVT Prophylaxis:  SCDx72hrs, ASA 325 mg BID x 2 weeks DISCHARGE PLAN: Home, today DISCHARGE NEEDS: HHPT, Walker and 72-in-1 comode seat     Kerin Salen 03/15/2018, 8:26 AM

## 2018-03-15 NOTE — Progress Notes (Signed)
Physical Therapy Treatment Patient Details Name: Katherine Nixon MRN: [id number] DOB: [birth year] Today's Date: 03/15/2018    History of Present Illness 72 y.o. female admitted on 03/12/18 for elective R TKA.  WBAT post op.  Pt with other significant PMH of lupus, HTN, dysrhythmia, chronic diarrhea, R THA (2017), and L shoulder surgery (2016).  Pt with post op chest pain, however troponins and work up negative thus far.  She is now on a heart monitor.    PT Comments    Pt is progressing slowly, but able to preform well enough to d/c home with her husband as planned later today. She will not need a second session the PM and RN was made aware. PT to follow acutely until d/c confirmed.      Follow Up Recommendations  Follow surgeon's recommendation for DC plan and follow-up therapies;Supervision for mobility/OOB     Equipment Recommendations  None recommended by PT    Recommendations for Other Services   NA     Precautions / Restrictions Precautions Precautions: Knee Precaution Booklet Issued: Yes (comment) Precaution Comments: knee exercise handout given , knee precaution reviewed Restrictions RLE Weight Bearing: Weight bearing as tolerated    Mobility  Bed Mobility Overal bed mobility: Needs Assistance Bed Mobility: Supine to Sit     Supine to sit: Min assist     General bed mobility comments: Attempted to have pt try to move her right leg to EOB, however, even with hook technique she needed min assist to progress right leg to EOB.   Transfers Overall transfer level: Needs assistance Equipment used: Rolling walker (2 wheeled) Transfers: Sit to/from Stand Sit to Stand: Min assist;From elevated surface         General transfer comment: Min assist to support trunk during transition to stand from elevated bed and lower recliner chair.    Ambulation/Gait Ambulation/Gait assistance: Min guard;Supervision Ambulation Distance (Feet): 120 Feet Assistive device: Rolling  walker (2 wheeled) Gait Pattern/deviations: Step-to pattern;Antalgic;Trunk flexed Gait velocity: 0.34 ft/sec- improved, but still indiactes high fall risk Gait velocity interpretation: <1.8 ft/sec, indicate of risk for recurrent falls General Gait Details: Verbal cues for upright posture, closer proximity to RW   Stairs Stairs: Yes Stairs assistance: Min guard Stair Management: Two rails;Step to pattern;Forwards;One rail Right;Sideways Number of Stairs: 2(x2) General stair comments: Practiced forward and sideways again giving pt options at home.  Min guard assist for safety and cues for correct LE sequencing, pt did not know when asked.           Balance Overall balance assessment: Needs assistance Sitting-balance support: Feet supported;No upper extremity supported Sitting balance-Leahy Scale: Good     Standing balance support: Bilateral upper extremity supported Standing balance-Leahy Scale: Poor                              Cognition Arousal/Alertness: Awake/alert Behavior During Therapy: WFL for tasks assessed/performed Overall Cognitive Status: Within Functional Limits for tasks assessed                                        Exercises Total Joint Exercises Ankle Circles/Pumps: AROM;Both;20 reps Quad Sets: AROM;Right;10 reps Towel Squeeze: AROM;Both;10 reps Heel Slides: AAROM;Right;10 reps Goniometric ROM: 8-73        Pertinent Vitals/Pain Pain Assessment: 0-10 Pain Score: 6  Pain Location: right knee Pain Descriptors /  Indicators: Burning;Aching Pain Intervention(s): Limited activity within patient's tolerance;Monitored during session;Repositioned           PT Goals (current goals can now be found in the care plan section) Acute Rehab PT Goals Patient Stated Goal: to increase her steadiness and not fall as much Progress towards PT goals: Progressing toward goals    Frequency    7X/week      PT Plan Current plan  remains appropriate       AM-PAC PT "6 Clicks" Daily Activity  Outcome Measure  Difficulty turning over in bed (including adjusting bedclothes, sheets and blankets)?: Unable Difficulty moving from lying on back to sitting on the side of the bed? : Unable Difficulty sitting down on and standing up from a chair with arms (e.g., wheelchair, bedside commode, etc,.)?: Unable Help needed moving to and from a bed to chair (including a wheelchair)?: A Little Help needed walking in hospital room?: A Little Help needed climbing 3-5 steps with a railing? : A Little 6 Click Score: 12    End of Session Equipment Utilized During Treatment: Gait belt Activity Tolerance: Patient limited by fatigue;Patient limited by pain Patient left: in chair;with call bell/phone within reach;with chair alarm set Nurse Communication: Mobility status PT Visit Diagnosis: Muscle weakness (generalized) (M62.81);Difficulty in walking, not elsewhere classified (R26.2);Pain Pain - Right/Left: Right Pain - part of body: Knee     Time: 1011-1053 PT Time Calculation (min) (ACUTE ONLY): 42 min  Charges:  $Gait Training: 23-37 mins $Therapeutic Exercise: 8-22 mins      Gregoria Selvy B. Progress, [place], DPT [phone]          03/15/2018, 11:03 AM

## 2018-03-16 DIAGNOSIS — M1712 Unilateral primary osteoarthritis, left knee: Secondary | ICD-10-CM | POA: Diagnosis not present

## 2018-03-16 DIAGNOSIS — G894 Chronic pain syndrome: Secondary | ICD-10-CM | POA: Diagnosis not present

## 2018-03-16 DIAGNOSIS — I10 Essential (primary) hypertension: Secondary | ICD-10-CM | POA: Diagnosis not present

## 2018-03-16 DIAGNOSIS — Z471 Aftercare following joint replacement surgery: Secondary | ICD-10-CM | POA: Diagnosis not present

## 2018-03-16 DIAGNOSIS — M329 Systemic lupus erythematosus, unspecified: Secondary | ICD-10-CM | POA: Diagnosis not present

## 2018-03-16 DIAGNOSIS — I872 Venous insufficiency (chronic) (peripheral): Secondary | ICD-10-CM | POA: Diagnosis not present

## 2018-03-16 DIAGNOSIS — M5136 Other intervertebral disc degeneration, lumbar region: Secondary | ICD-10-CM | POA: Diagnosis not present

## 2018-03-16 DIAGNOSIS — I499 Cardiac arrhythmia, unspecified: Secondary | ICD-10-CM | POA: Diagnosis not present

## 2018-03-16 DIAGNOSIS — Z96651 Presence of right artificial knee joint: Secondary | ICD-10-CM | POA: Diagnosis not present

## 2018-03-16 DIAGNOSIS — K582 Mixed irritable bowel syndrome: Secondary | ICD-10-CM | POA: Diagnosis not present

## 2018-03-19 DIAGNOSIS — Z471 Aftercare following joint replacement surgery: Secondary | ICD-10-CM | POA: Diagnosis not present

## 2018-03-19 DIAGNOSIS — K582 Mixed irritable bowel syndrome: Secondary | ICD-10-CM | POA: Diagnosis not present

## 2018-03-19 DIAGNOSIS — M329 Systemic lupus erythematosus, unspecified: Secondary | ICD-10-CM | POA: Diagnosis not present

## 2018-03-19 DIAGNOSIS — I499 Cardiac arrhythmia, unspecified: Secondary | ICD-10-CM | POA: Diagnosis not present

## 2018-03-19 DIAGNOSIS — M1712 Unilateral primary osteoarthritis, left knee: Secondary | ICD-10-CM | POA: Diagnosis not present

## 2018-03-19 DIAGNOSIS — I10 Essential (primary) hypertension: Secondary | ICD-10-CM | POA: Diagnosis not present

## 2018-03-20 DIAGNOSIS — M1711 Unilateral primary osteoarthritis, right knee: Secondary | ICD-10-CM | POA: Diagnosis not present

## 2018-03-20 DIAGNOSIS — Z96651 Presence of right artificial knee joint: Secondary | ICD-10-CM | POA: Diagnosis not present

## 2018-03-20 DIAGNOSIS — Z471 Aftercare following joint replacement surgery: Secondary | ICD-10-CM | POA: Diagnosis not present

## 2018-03-21 DIAGNOSIS — M25561 Pain in right knee: Secondary | ICD-10-CM | POA: Diagnosis not present

## 2018-03-21 DIAGNOSIS — R262 Difficulty in walking, not elsewhere classified: Secondary | ICD-10-CM | POA: Diagnosis not present

## 2018-03-21 DIAGNOSIS — Z471 Aftercare following joint replacement surgery: Secondary | ICD-10-CM | POA: Diagnosis not present

## 2018-03-21 DIAGNOSIS — Z96651 Presence of right artificial knee joint: Secondary | ICD-10-CM | POA: Diagnosis not present

## 2018-03-27 DIAGNOSIS — Z96651 Presence of right artificial knee joint: Secondary | ICD-10-CM | POA: Diagnosis not present

## 2018-03-27 DIAGNOSIS — Z471 Aftercare following joint replacement surgery: Secondary | ICD-10-CM | POA: Diagnosis not present

## 2018-03-27 DIAGNOSIS — M25561 Pain in right knee: Secondary | ICD-10-CM | POA: Diagnosis not present

## 2018-03-27 DIAGNOSIS — R262 Difficulty in walking, not elsewhere classified: Secondary | ICD-10-CM | POA: Diagnosis not present

## 2018-03-29 DIAGNOSIS — R262 Difficulty in walking, not elsewhere classified: Secondary | ICD-10-CM | POA: Diagnosis not present

## 2018-03-29 DIAGNOSIS — Z471 Aftercare following joint replacement surgery: Secondary | ICD-10-CM | POA: Diagnosis not present

## 2018-03-29 DIAGNOSIS — M25561 Pain in right knee: Secondary | ICD-10-CM | POA: Diagnosis not present

## 2018-03-29 DIAGNOSIS — Z96651 Presence of right artificial knee joint: Secondary | ICD-10-CM | POA: Diagnosis not present

## 2018-04-03 ENCOUNTER — Other Ambulatory Visit: Payer: Self-pay | Admitting: Family Medicine

## 2018-04-04 ENCOUNTER — Encounter: Payer: Self-pay | Admitting: Sports Medicine

## 2018-04-04 ENCOUNTER — Ambulatory Visit (INDEPENDENT_AMBULATORY_CARE_PROVIDER_SITE_OTHER): Payer: Medicare Other

## 2018-04-04 ENCOUNTER — Ambulatory Visit (INDEPENDENT_AMBULATORY_CARE_PROVIDER_SITE_OTHER): Payer: Medicare Other | Admitting: Sports Medicine

## 2018-04-04 DIAGNOSIS — M533 Sacrococcygeal disorders, not elsewhere classified: Secondary | ICD-10-CM | POA: Diagnosis not present

## 2018-04-04 DIAGNOSIS — Z471 Aftercare following joint replacement surgery: Secondary | ICD-10-CM | POA: Diagnosis not present

## 2018-04-04 DIAGNOSIS — R262 Difficulty in walking, not elsewhere classified: Secondary | ICD-10-CM | POA: Diagnosis not present

## 2018-04-04 DIAGNOSIS — M25561 Pain in right knee: Secondary | ICD-10-CM | POA: Diagnosis not present

## 2018-04-04 DIAGNOSIS — Z96651 Presence of right artificial knee joint: Secondary | ICD-10-CM | POA: Diagnosis not present

## 2018-04-04 NOTE — Assessment & Plan Note (Signed)
New SI joint x-rays. I do think this is likely from positioning during her knee replacement. Failure of NSAIDs, narcotics. Right sacroiliac joint injection as above, return to see me in 1 month.

## 2018-04-04 NOTE — Progress Notes (Signed)
Subjective:    I'm seeing this patient as a consultation for: Dr. Beatrice Lecher  CC: Right-sided low back pain  HPI: This is a pleasant 72 year old female with known hip arthritis, lumbar degenerative disc disease.  She recently had a knee arthroplasty, when she woke up from anesthesia she had pain in her right SI joint with radiation down the back of the right thigh but not past the knee.  Pain is severe, persistent, she is tried NSAIDs without any improvement, oxycodone without any improvement.  She discussed this with her orthopedic surgeon who recommended the SI joint injection.  I reviewed the past medical history, family history, social history, surgical history, and allergies today and no changes were needed.  Please see the problem list section below in epic for further details.  Past Medical History: Past Medical History:  Diagnosis Date  . Anxiety   . Basal cell carcinoma 01/2018   "upper lip"  . Chronic diarrhea   . Complication of anesthesia 1989   diff to wake up with gallbaldder surgery- no problems since (03/12/2018)  . Connective tissue disorder ([place])    "lupus"  . Depression   . Dysrhythmia     states at times  . High cholesterol   . History of blood transfusion 1970   "related to childbirth"  . Hypertension   . IBS (irritable bowel syndrome)   . Migraine    "none since ~ 1980" (03/12/2018)  . Neuromuscular disorder (HCC)    lupus  . Osteoarthritis    "all my joints" (03/12/2018)  . Squamous cell carcinoma    "right arm"   Past Surgical History: Past Surgical History:  Procedure Laterality Date  . BASAL CELL CARCINOMA EXCISION  01/2018   "upper lip"  . BREAST BIOPSY Left     neg  . BREAST CYST ASPIRATION Right   . CATARACT EXTRACTION W/ INTRAOCULAR LENS  IMPLANT, BILATERAL    . CHOLECYSTECTOMY OPEN  1989  . COLONOSCOPY    . DILATION AND CURETTAGE OF UTERUS    . JOINT REPLACEMENT    . KNEE ARTHROSCOPY Bilateral   . NASAL SINUS SURGERY     "severe infection"  . SHOULDER ARTHROSCOPY Left 12/2014   Dr. Tamera Punt   . SQUAMOUS CELL CARCINOMA EXCISION Right    arm  . TOTAL HIP ARTHROPLASTY Right 08/31/2016   Procedure: TOTAL HIP ARTHROPLASTY ANTERIOR APPROACH;  Surgeon: Frederik Pear, MD;  Location: [place];  Service: Orthopedics;  Laterality: Right;  . TOTAL KNEE ARTHROPLASTY Right 03/12/2018  . TOTAL KNEE ARTHROPLASTY Right 03/12/2018   Procedure: TOTAL KNEE ARTHROPLASTY;  Surgeon: Frederik Pear, MD;  Location: [place];  Service: Orthopedics;  Laterality: Right;  Marland Kitchen VARICOSE VEIN SURGERY Bilateral    Social History: Social History   Socioeconomic History  . Marital status: Married    Spouse name: Jimmie  . Number of children: 4  . Years of education: 89  . Highest education level: Not on file  Occupational History  . 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 Financial resource strain: Not on file  . Food insecurity:    Worry: Not on file    Inability: Not on file  . Transportation needs:    Medical: Not on file    Non-medical: Not on file  Tobacco Use  . Smoking status: Former Smoker    Packs/day: 1.00    Years: 5.00    Pack years: 5.00    Types: Cigarettes  Last attempt to quit: 1989    Years since quitting: 30.3  . Smokeless tobacco: Never Used  Substance and Sexual Activity  . Alcohol use: No  . Drug use: No  . Sexual activity: Not Currently  Lifestyle  . Physical activity:    Days per week: Not on file    Minutes per session: Not on file  . Stress: Not on file  Relationships  . Social connections:    Talks on phone: Not on file    Gets together: Not on file    Attends religious service: Not on file    Active member of club or organization: Not on file    Attends meetings of clubs or organizations: Not on file    Relationship status: Not on file  Other Topics Concern  . Not on file  Social History Narrative  . Not on file   Family History: Family History    Problem Relation Age of Onset  . Heart disease Mother   . Heart attack Mother   . Hyperlipidemia Father   . Other Father        amputaiton  . Hyperlipidemia Brother   . Hypertension Brother    Allergies: Allergies  Allergen Reactions  . Gabapentin Other (See Comments)    Hallucination   . Nsaids Other (See Comments)    Due to liver enzyme issue (meloxicam is ok per patient)  . Tramadol Other (See Comments)    Hallucinations   . Pravastatin Other (See Comments)    Muscle cramping  . Sulfamethoxazole-Trimethoprim Other (See Comments)    Thrush in mouth   Medications: See med rec.  Review of Systems: No headache, visual changes, nausea, vomiting, diarrhea, constipation, dizziness, abdominal pain, skin rash, fevers, chills, night sweats, weight loss, swollen lymph nodes, body aches, joint swelling, muscle aches, chest pain, shortness of breath, mood changes, visual or auditory hallucinations.   Objective:   General: Well Developed, well nourished, and in no acute distress.  Neuro:  Extra-ocular muscles intact, able to move all 4 extremities, sensation grossly intact.  Deep tendon reflexes tested were normal. Psych: Alert and oriented, mood congruent with affect. ENT:  Ears and nose appear unremarkable.  Hearing grossly normal. Neck: Unremarkable overall appearance, trachea midline.  No visible thyroid enlargement. Eyes: Conjunctivae and lids appear unremarkable.  Pupils equal and round. Skin: Warm and dry, no rashes noted.  Cardiovascular: Pulses palpable, no extremity edema. Back Exam:  Inspection: Unremarkable  Motion: Flexion 45 deg, Extension 45 deg, Side Bending to 45 deg bilaterally,  Rotation to 45 deg bilaterally  SLR laying: Negative  XSLR laying: Negative  Palpable tenderness: Right sacroiliac joint. FABER: negative. Sensory change: Gross sensation intact to all lumbar and sacral dermatomes.  Reflexes: 2+ at both patellar tendons, 2+ at achilles tendons,  Babinski's downgoing.  Strength at foot  Plantar-flexion: 5/5 Dorsi-flexion: 5/5 Eversion: 5/5 Inversion: 5/5  Leg strength  Quad: 5/5 Hamstring: 5/5 Hip flexor: 5/5 Hip abductors: 5/5  Gait unremarkable.  Procedure: Real-time Ultrasound Guided Injection of right sacroiliac joint Device: GE Logiq E  Verbal informed consent obtained.  Time-out conducted.  Noted no overlying erythema, induration, or other signs of local infection.  Skin prepped in a sterile fashion.  Local anesthesia: Topical Ethyl chloride.  With sterile technique and under real time ultrasound guidance: Using a 22-gauge spinal needle advanced into the SI joint, and injected 1 cc Kenalog 40, 2 cc lidocaine, 2 cc bupivacaine. Completed without difficulty  Pain immediately resolved suggesting accurate  placement of the medication.  Advised to call if fevers/chills, erythema, induration, drainage, or persistent bleeding.  Images permanently stored and available for review in the ultrasound unit.  Impression: Technically successful ultrasound guided injection.  Impression and Recommendations:   This case required medical decision making of moderate complexity.  Pain of right sacroiliac joint New SI joint x-rays. I do think this is likely from positioning during her knee replacement. Failure of NSAIDs, narcotics. Right sacroiliac joint injection as above, return to see me in 1 month. ___________________________________________ Gwen Her. Dianah Field, M.D., ABFM., CAQSM. Primary Care and [place] Instructor of [place] of [facility] of Medicine

## 2018-04-09 DIAGNOSIS — M25561 Pain in right knee: Secondary | ICD-10-CM | POA: Diagnosis not present

## 2018-04-09 DIAGNOSIS — R262 Difficulty in walking, not elsewhere classified: Secondary | ICD-10-CM | POA: Diagnosis not present

## 2018-04-09 DIAGNOSIS — Z96651 Presence of right artificial knee joint: Secondary | ICD-10-CM | POA: Diagnosis not present

## 2018-04-09 DIAGNOSIS — Z471 Aftercare following joint replacement surgery: Secondary | ICD-10-CM | POA: Diagnosis not present

## 2018-04-11 DIAGNOSIS — Z96651 Presence of right artificial knee joint: Secondary | ICD-10-CM | POA: Diagnosis not present

## 2018-04-11 DIAGNOSIS — Z471 Aftercare following joint replacement surgery: Secondary | ICD-10-CM | POA: Diagnosis not present

## 2018-04-11 DIAGNOSIS — R262 Difficulty in walking, not elsewhere classified: Secondary | ICD-10-CM | POA: Diagnosis not present

## 2018-04-11 DIAGNOSIS — M25561 Pain in right knee: Secondary | ICD-10-CM | POA: Diagnosis not present

## 2018-04-17 DIAGNOSIS — M25561 Pain in right knee: Secondary | ICD-10-CM | POA: Diagnosis not present

## 2018-04-17 DIAGNOSIS — Z96651 Presence of right artificial knee joint: Secondary | ICD-10-CM | POA: Diagnosis not present

## 2018-04-17 DIAGNOSIS — R262 Difficulty in walking, not elsewhere classified: Secondary | ICD-10-CM | POA: Diagnosis not present

## 2018-04-17 DIAGNOSIS — Z471 Aftercare following joint replacement surgery: Secondary | ICD-10-CM | POA: Diagnosis not present

## 2018-04-19 DIAGNOSIS — Z471 Aftercare following joint replacement surgery: Secondary | ICD-10-CM | POA: Diagnosis not present

## 2018-04-19 DIAGNOSIS — R262 Difficulty in walking, not elsewhere classified: Secondary | ICD-10-CM | POA: Diagnosis not present

## 2018-04-19 DIAGNOSIS — Z96651 Presence of right artificial knee joint: Secondary | ICD-10-CM | POA: Diagnosis not present

## 2018-04-19 DIAGNOSIS — M25561 Pain in right knee: Secondary | ICD-10-CM | POA: Diagnosis not present

## 2018-04-21 ENCOUNTER — Other Ambulatory Visit: Payer: Self-pay | Admitting: Family Medicine

## 2018-05-03 ENCOUNTER — Encounter: Payer: Self-pay | Admitting: Sports Medicine

## 2018-05-03 ENCOUNTER — Ambulatory Visit (INDEPENDENT_AMBULATORY_CARE_PROVIDER_SITE_OTHER): Payer: Medicare Other | Admitting: Sports Medicine

## 2018-05-03 ENCOUNTER — Telehealth: Payer: Self-pay | Admitting: Sports Medicine

## 2018-05-03 DIAGNOSIS — K5904 Chronic idiopathic constipation: Secondary | ICD-10-CM | POA: Diagnosis not present

## 2018-05-03 DIAGNOSIS — M533 Sacrococcygeal disorders, not elsewhere classified: Secondary | ICD-10-CM | POA: Diagnosis not present

## 2018-05-03 DIAGNOSIS — M5136 Other intervertebral disc degeneration, lumbar region: Secondary | ICD-10-CM

## 2018-05-03 DIAGNOSIS — M51369 Other intervertebral disc degeneration, lumbar region without mention of lumbar back pain or lower extremity pain: Secondary | ICD-10-CM

## 2018-05-03 MED ORDER — LINACLOTIDE 145 MCG PO CAPS: 145.0000 ug | ORAL_CAPSULE | Freq: Every day | ORAL | 3 refills | Status: DC

## 2018-05-03 MED ORDER — TIZANIDINE HCL 2 MG PO TABS: 2.0000 mg | ORAL_TABLET | Freq: Four times a day (QID) | ORAL | 0 refills | Status: DC | PRN

## 2018-05-03 NOTE — Telephone Encounter (Signed)
Approvedtoday 05/03/2018 CaseId:[id number];Status:Approved;Review Type:Prior Auth;Coverage Start Date:04/03/2018;Coverage End Date:05/03/2019; Pharmacy notified.

## 2018-05-03 NOTE — Progress Notes (Signed)
Subjective:    CC: Follow-up  HPI: SI joint pain: Right-sided, completely resolved after SI joint injection under ultrasound guidance at the last visit.  Lumbar degenerative disc disease: Now starting to have a recurrence of this pain, she had an L4-L5 epidural about 4 years ago, did well since then.  It is not hurting enough at this point to consider a repeat epidural.  Constipation: Stools once every 4 to 10 days, she is tried over-the-counter laxatives including MiraLAX, nothing is working.  No hematochezia, melena.  I reviewed the past medical history, family history, social history, surgical history, and allergies today and no changes were needed.  Please see the problem list section below in epic for further details.  Past Medical History: Past Medical History:  Diagnosis Date  . Anxiety   . Basal cell carcinoma 01/2018   "upper lip"  . Chronic diarrhea   . Complication of anesthesia 1989   diff to wake up with gallbaldder surgery- no problems since (03/12/2018)  . Connective tissue disorder ([place])    "lupus"  . Depression   . Dysrhythmia     states at times  . High cholesterol   . History of blood transfusion 1970   "related to childbirth"  . Hypertension   . IBS (irritable bowel syndrome)   . Migraine    "none since ~ 1980" (03/12/2018)  . Neuromuscular disorder (HCC)    lupus  . Osteoarthritis    "all my joints" (03/12/2018)  . Squamous cell carcinoma    "right arm"   Past Surgical History: Past Surgical History:  Procedure Laterality Date  . BASAL CELL CARCINOMA EXCISION  01/2018   "upper lip"  . BREAST BIOPSY Left     neg  . BREAST CYST ASPIRATION Right   . CATARACT EXTRACTION W/ INTRAOCULAR LENS  IMPLANT, BILATERAL    . CHOLECYSTECTOMY OPEN  1989  . COLONOSCOPY    . DILATION AND CURETTAGE OF UTERUS    . JOINT REPLACEMENT    . KNEE ARTHROSCOPY Bilateral   . NASAL SINUS SURGERY     "severe infection"  . SHOULDER ARTHROSCOPY Left 12/2014   Dr. Tamera Punt    . SQUAMOUS CELL CARCINOMA EXCISION Right    arm  . TOTAL HIP ARTHROPLASTY Right 08/31/2016   Procedure: TOTAL HIP ARTHROPLASTY ANTERIOR APPROACH;  Surgeon: Frederik Pear, MD;  Location: [place];  Service: Orthopedics;  Laterality: Right;  . TOTAL KNEE ARTHROPLASTY Right 03/12/2018  . TOTAL KNEE ARTHROPLASTY Right 03/12/2018   Procedure: TOTAL KNEE ARTHROPLASTY;  Surgeon: Frederik Pear, MD;  Location: [place];  Service: Orthopedics;  Laterality: Right;  Marland Kitchen VARICOSE VEIN SURGERY Bilateral    Social History: Social History   Socioeconomic History  . Marital status: Married    Spouse name: Jimmie  . Number of children: 4  . Years of education: 74  . Highest education level: Not on file  Occupational History  . 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 Financial resource strain: Not on file  . Food insecurity:    Worry: Not on file    Inability: Not on file  . Transportation needs:    Medical: Not on file    Non-medical: Not on file  Tobacco Use  . Smoking status: Former Smoker    Packs/day: 1.00    Years: 5.00    Pack years: 5.00    Types: Cigarettes    Last attempt to quit: 1989    Years since  quitting: 30.4  . Smokeless tobacco: Never Used  Substance and Sexual Activity  . Alcohol use: No  . Drug use: No  . Sexual activity: Not Currently  Lifestyle  . Physical activity:    Days per week: Not on file    Minutes per session: Not on file  . Stress: Not on file  Relationships  . Social connections:    Talks on phone: Not on file    Gets together: Not on file    Attends religious service: Not on file    Active member of club or organization: Not on file    Attends meetings of clubs or organizations: Not on file    Relationship status: Not on file  Other Topics Concern  . Not on file  Social History Narrative  . Not on file   Family History: Family History  Problem Relation Age of Onset  . Heart disease Mother   . Heart attack  Mother   . Hyperlipidemia Father   . Other Father        amputaiton  . Hyperlipidemia Brother   . Hypertension Brother    Allergies: Allergies  Allergen Reactions  . Gabapentin Other (See Comments)    Hallucination   . Nsaids Other (See Comments)    Due to liver enzyme issue (meloxicam is ok per patient)  . Tramadol Other (See Comments)    Hallucinations   . Pravastatin Other (See Comments)    Muscle cramping  . Sulfamethoxazole-Trimethoprim Other (See Comments)    Thrush in mouth   Medications: See med rec.  Review of Systems: No fevers, chills, night sweats, weight loss, chest pain, or shortness of breath.   Objective:    General: Well Developed, well nourished, and in no acute distress.  Neuro: Alert and oriented x3, extra-ocular muscles intact, sensation grossly intact.  HEENT: Normocephalic, atraumatic, pupils equal round reactive to light, neck supple, no masses, no lymphadenopathy, thyroid nonpalpable.  Skin: Warm and dry, no rashes. Cardiac: Regular rate and rhythm, no murmurs rubs or gallops, no lower extremity edema.  Respiratory: Clear to auscultation bilaterally. Not using accessory muscles, speaking in full sentences. Back Exam:  Inspection: Unremarkable  Motion: Flexion 45 deg, Extension 45 deg, Side Bending to 45 deg bilaterally,  Rotation to 45 deg bilaterally  SLR laying: Negative  XSLR laying: Negative  Palpable tenderness: None. FABER: negative. Sensory change: Gross sensation intact to all lumbar and sacral dermatomes.  Reflexes: 2+ at both patellar tendons, 2+ at achilles tendons, Babinski's downgoing.  Strength at foot  Plantar-flexion: 5/5 Dorsi-flexion: 5/5 Eversion: 5/5 Inversion: 5/5  Leg strength  Quad: 5/5 Hamstring: 5/5 Hip flexor: 5/5 Hip abductors: 5/5  Gait unremarkable.  Impression and Recommendations:    Pain of right sacroiliac joint Completely resolved after right sacroiliac joint injection at the last visit.  DDD  (degenerative disc disease), lumbar Did extremely well to an L4-L5 epidural approximately 3 years ago. Is only now starting to have a slight recurrence of pain in the midline, consistent with her typical discogenic pain. She will call me when she is ready for an injection but it is not hurting enough at this point.  Chronic idiopathic constipation Not improving with MiraLAX and other over-the-counter laxatives. Adding Linzess 145 mg daily. In 1 month we will go over a stool diary, and determine whether to increase or decrease her dose. ___________________________________________ Gwen Her. Dianah Field, M.D., ABFM., CAQSM. Primary Care and [place] Instructor of Family Medicine  [employer] of Medicine

## 2018-05-03 NOTE — Assessment & Plan Note (Signed)
Did extremely well to an L4-L5 epidural approximately 3 years ago. Is only now starting to have a slight recurrence of pain in the midline, consistent with her typical discogenic pain. She will call me when she is ready for an injection but it is not hurting enough at this point.

## 2018-05-03 NOTE — Assessment & Plan Note (Signed)
Not improving with MiraLAX and other over-the-counter laxatives. Adding Linzess 145 mg daily. In 1 month we will go over a stool diary, and determine whether to increase or decrease her dose.

## 2018-05-03 NOTE — Assessment & Plan Note (Signed)
Completely resolved after right sacroiliac joint injection at the last visit.

## 2018-06-04 ENCOUNTER — Encounter: Payer: Self-pay | Admitting: Sports Medicine

## 2018-06-04 ENCOUNTER — Ambulatory Visit (INDEPENDENT_AMBULATORY_CARE_PROVIDER_SITE_OTHER): Payer: Medicare Other | Admitting: Sports Medicine

## 2018-06-04 DIAGNOSIS — K5904 Chronic idiopathic constipation: Secondary | ICD-10-CM

## 2018-06-04 MED ORDER — LINACLOTIDE 72 MCG PO CAPS: 72.0000 ug | ORAL_CAPSULE | Freq: Every day | ORAL | 3 refills | Status: DC

## 2018-06-04 MED ORDER — TIZANIDINE HCL 2 MG PO TABS: 2.0000 mg | ORAL_TABLET | Freq: Four times a day (QID) | ORAL | 3 refills | Status: DC | PRN

## 2018-06-04 NOTE — Progress Notes (Signed)
Subjective:    CC: Follow-up  HPI: Chronic idiopathic constipation: She had a blowout with Linzess 145, needs to drop the dose.  I reviewed the past medical history, family history, social history, surgical history, and allergies today and no changes were needed.  Please see the problem list section below in epic for further details.  Past Medical History: Past Medical History:  Diagnosis Date  . Anxiety   . Basal cell carcinoma 01/2018   "upper lip"  . Chronic diarrhea   . Complication of anesthesia 1989   diff to wake up with gallbaldder surgery- no problems since (03/12/2018)  . Connective tissue disorder ([place])    "lupus"  . Depression   . Dysrhythmia     states at times  . High cholesterol   . History of blood transfusion 1970   "related to childbirth"  . Hypertension   . IBS (irritable bowel syndrome)   . Migraine    "none since ~ 1980" (03/12/2018)  . Neuromuscular disorder (HCC)    lupus  . Osteoarthritis    "all my joints" (03/12/2018)  . Squamous cell carcinoma    "right arm"   Past Surgical History: Past Surgical History:  Procedure Laterality Date  . BASAL CELL CARCINOMA EXCISION  01/2018   "upper lip"  . BREAST BIOPSY Left     neg  . BREAST CYST ASPIRATION Right   . CATARACT EXTRACTION W/ INTRAOCULAR LENS  IMPLANT, BILATERAL    . CHOLECYSTECTOMY OPEN  1989  . COLONOSCOPY    . DILATION AND CURETTAGE OF UTERUS    . JOINT REPLACEMENT    . KNEE ARTHROSCOPY Bilateral   . NASAL SINUS SURGERY     "severe infection"  . SHOULDER ARTHROSCOPY Left 12/2014   Dr. Tamera Punt   . SQUAMOUS CELL CARCINOMA EXCISION Right    arm  . TOTAL HIP ARTHROPLASTY Right 08/31/2016   Procedure: TOTAL HIP ARTHROPLASTY ANTERIOR APPROACH;  Surgeon: Frederik Pear, MD;  Location: [place];  Service: Orthopedics;  Laterality: Right;  . TOTAL KNEE ARTHROPLASTY Right 03/12/2018  . TOTAL KNEE ARTHROPLASTY Right 03/12/2018   Procedure: TOTAL KNEE ARTHROPLASTY;  Surgeon: Frederik Pear, MD;   Location: [place];  Service: Orthopedics;  Laterality: Right;  Marland Kitchen VARICOSE VEIN SURGERY Bilateral    Social History: Social History   Socioeconomic History  . Marital status: Married    Spouse name: Jimmie  . Number of children: 4  . Years of education: 97  . Highest education level: Not on file  Occupational History  . 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 Financial resource strain: Not on file  . Food insecurity:    Worry: Not on file    Inability: Not on file  . Transportation needs:    Medical: Not on file    Non-medical: Not on file  Tobacco Use  . Smoking status: Former Smoker    Packs/day: 1.00    Years: 5.00    Pack years: 5.00    Types: Cigarettes    Last attempt to quit: 1989    Years since quitting: 30.5  . Smokeless tobacco: Never Used  Substance and Sexual Activity  . Alcohol use: No  . Drug use: No  . Sexual activity: Not Currently  Lifestyle  . Physical activity:    Days per week: Not on file    Minutes per session: Not on file  . Stress: Not on file  Relationships  . Social connections:  Talks on phone: Not on file    Gets together: Not on file    Attends religious service: Not on file    Active member of club or organization: Not on file    Attends meetings of clubs or organizations: Not on file    Relationship status: Not on file  Other Topics Concern  . Not on file  Social History Narrative  . Not on file   Family History: Family History  Problem Relation Age of Onset  . Heart disease Mother   . Heart attack Mother   . Hyperlipidemia Father   . Other Father        amputaiton  . Hyperlipidemia Brother   . Hypertension Brother    Allergies: Allergies  Allergen Reactions  . Gabapentin Other (See Comments)    Hallucination   . Nsaids Other (See Comments)    Due to liver enzyme issue (meloxicam is ok per patient)  . Tramadol Other (See Comments)    Hallucinations   . Pravastatin Other  (See Comments)    Muscle cramping  . Sulfamethoxazole-Trimethoprim Other (See Comments)    Thrush in mouth   Medications: See med rec.  Review of Systems: No fevers, chills, night sweats, weight loss, chest pain, or shortness of breath.   Objective:    General: Well Developed, well nourished, and in no acute distress.  Neuro: Alert and oriented x3, extra-ocular muscles intact, sensation grossly intact.  HEENT: Normocephalic, atraumatic, pupils equal round reactive to light, neck supple, no masses, no lymphadenopathy, thyroid nonpalpable.  Skin: Warm and dry, no rashes. Cardiac: Regular rate and rhythm, no murmurs rubs or gallops, no lower extremity edema.  Respiratory: Clear to auscultation bilaterally. Not using accessory muscles, speaking in full sentences.  Impression and Recommendations:    Chronic idiopathic constipation Had a blowout with Linzess 195mcg, dropping do low dose 62mcg. ___________________________________________ Gwen Her. Dianah Field, M.D., ABFM., CAQSM. Primary Care and [place] Instructor of [place] of [facility] of Medicine

## 2018-06-04 NOTE — Assessment & Plan Note (Signed)
Had a blowout with Linzess 143mcg, dropping do low dose 28mcg.

## 2018-06-14 DIAGNOSIS — Z96651 Presence of right artificial knee joint: Secondary | ICD-10-CM | POA: Diagnosis not present

## 2018-06-14 DIAGNOSIS — Z09 Encounter for follow-up examination after completed treatment for conditions other than malignant neoplasm: Secondary | ICD-10-CM | POA: Diagnosis not present

## 2018-06-14 DIAGNOSIS — M25561 Pain in right knee: Secondary | ICD-10-CM | POA: Diagnosis not present

## 2018-06-25 ENCOUNTER — Ambulatory Visit: Payer: Medicare Other | Admitting: Family Medicine

## 2018-08-26 ENCOUNTER — Other Ambulatory Visit: Payer: Self-pay | Admitting: Family Medicine

## 2018-08-28 DIAGNOSIS — M1712 Unilateral primary osteoarthritis, left knee: Secondary | ICD-10-CM | POA: Diagnosis not present

## 2018-10-01 ENCOUNTER — Other Ambulatory Visit: Payer: Self-pay | Admitting: Family Medicine

## 2018-10-01 NOTE — Telephone Encounter (Signed)
Routing to pcp for signature.Tivis Wherry Lynetta, CMA  

## 2018-10-01 NOTE — Telephone Encounter (Signed)
Needs appt

## 2018-10-10 ENCOUNTER — Other Ambulatory Visit: Payer: Self-pay | Admitting: Family Medicine

## 2018-10-10 ENCOUNTER — Ambulatory Visit (INDEPENDENT_AMBULATORY_CARE_PROVIDER_SITE_OTHER): Payer: Medicare Other | Admitting: Family Medicine

## 2018-10-10 ENCOUNTER — Encounter: Payer: Self-pay | Admitting: Family Medicine

## 2018-10-10 VITALS — BP 128/51 | HR 65 | Ht 63.68 in | Wt 179.0 lb

## 2018-10-10 DIAGNOSIS — R011 Cardiac murmur, unspecified: Secondary | ICD-10-CM | POA: Diagnosis not present

## 2018-10-10 DIAGNOSIS — R2689 Other abnormalities of gait and mobility: Secondary | ICD-10-CM

## 2018-10-10 DIAGNOSIS — Z23 Encounter for immunization: Secondary | ICD-10-CM | POA: Diagnosis not present

## 2018-10-10 DIAGNOSIS — I10 Essential (primary) hypertension: Secondary | ICD-10-CM | POA: Diagnosis not present

## 2018-10-10 DIAGNOSIS — H9192 Unspecified hearing loss, left ear: Secondary | ICD-10-CM

## 2018-10-10 DIAGNOSIS — Z1231 Encounter for screening mammogram for malignant neoplasm of breast: Secondary | ICD-10-CM

## 2018-10-10 LAB — BASIC METABOLIC PANEL WITH GFR
BUN: 15 mg/dL (ref 7–25)
CALCIUM: 10.3 mg/dL (ref 8.6–10.4)
CO2: 32 mmol/L (ref 20–32)
Chloride: 103 mmol/L (ref 98–110)
Creat: 0.88 mg/dL (ref 0.60–0.93)
GFR, EST AFRICAN AMERICAN: 77 mL/min/{1.73_m2} (ref >=60)
GFR, EST NON AFRICAN AMERICAN: 66 mL/min/{1.73_m2} (ref >=60)
Glucose, Bld: 80 mg/dL (ref 65–99)
Potassium: 3.7 mmol/L (ref 3.5–5.3)
Sodium: 141 mmol/L (ref 135–146)

## 2018-10-10 NOTE — Progress Notes (Signed)
Subjective:    CC: BP  HPI:  Hypertension- Pt denies chest pain, SOB, dizziness, or heart palpitations.  Taking meds as directed w/o problems.  Denies medication side effects.  Reports she is been getting lower blood pressures at home typically in the low teens and even as low as 108/56.  She has had some problems with feeling a little low energy but also feels like she is getting older and that might be part of it to.  Most the time she does not experience dizziness unless she bends over and then stands up too quickly  She does feel like her balance is gradually been getting a little more off.  She had a right knee replacement a year ago and is doing great with that but is planning for lead and left knee replacement in January and says she admits she probably babies it too much but to the point where she feels like she might fall.  She does try to do some squats and she does try to do some pedal bike for about 10 minutes a day.  She also wanted to discuss her hearing.  She feels like overall she hears okay she just notices that she has a lot of difficulty with discrimination from where noises are actually coming from.  She feels like it might be more impacted on her left ear versus her right.  Is not interested in any type of hearing aids etc.   Past medical history, Surgical history, Family history not pertinant except as noted below, Social history, Allergies, and medications have been entered into the medical record, reviewed, and corrections made.   Review of Systems: No fevers, chills, night sweats, weight loss, chest pain, or shortness of breath.   Objective:    General: Well Developed, well nourished, and in no acute distress.  Neuro: Alert and oriented x3, extra-ocular muscles intact, sensation grossly intact.  HEENT: Normocephalic, atraumatic TMs and canals are clear bilaterally.  Her left TM is more opaque compared to her right but light reflex looks like it is in the appropriate  place.  No bulging or fluid. Skin: Warm and dry, no rashes. Cardiac: Regular rate and rhythm, she does have a 2 out of 6 systolic ejection murmur best heard at the right sternal border.  No carotid bruits. Respiratory: Clear to auscultation bilaterally. Not using accessory muscles, speaking in full sentences.   Impression and Recommendations:    HTN -well controlled.  Continue current regimen.  Follow-up in 6 months.  Heart murmur, new-we will get echocardiogram.  She says occasionally she will feel some skipping in her heart but is never been told that she has a heart murmur.  Hearing loss-we discussed the possibility of doing formal and informal testing.  We do a screening test here and we could also send her for more formal testing.  Right now she declines and says she does not think she would want a hearing aid.  Her husband had this 1 and he has difficulty with his.  But she just wanted to let me know.  I did examine her ears just to make sure that there was no cerumen impaction etc. and did not notice any problems.  Balance problem-we discussed getting her more active and involved in classes such as tai chi and balance are seen years.  Explained that particularly if she is favoring some knee pain etc. that she is probably getting more weak in her quads and hamstrings.  And this can affect gait  and balance and just shortness of foot as she gets older.  The main treatment is to do exercise to combat this.  Gave her some suggestions I do offer some classes at the local [facility].  Unfortunately her insurance plan does not cover any type of gym benefit but certainly she could look into it.

## 2018-10-10 NOTE — Patient Instructions (Signed)
Fall Prevention in the Home Falls can cause injuries and can affect people from all age groups. There are many simple things that you can do to make your home safe and to help prevent falls. What can I do on the outside of my home?  Regularly repair the edges of walkways and driveways and fix any cracks.  Remove high doorway thresholds.  Trim any shrubbery on the main path into your home.  Use bright outdoor lighting.  Clear walkways of debris and clutter, including tools and rocks.  Regularly check that handrails are securely fastened and in good repair. Both sides of any steps should have handrails.  Install guardrails along the edges of any raised decks or porches.  Have leaves, snow, and ice cleared regularly.  Use sand or salt on walkways during winter months.  In the garage, clean up any spills right away, including grease or oil spills. What can I do in the bathroom?  Use night lights.  Install grab bars by the toilet and in the tub and shower. Do not use towel bars as grab bars.  Use non-skid mats or decals on the floor of the tub or shower.  If you need to sit down while you are in the shower, use a plastic, non-slip stool.  Keep the floor dry. Immediately clean up any water that spills on the floor.  Remove soap buildup in the tub or shower on a regular basis.  Attach bath mats securely with double-sided non-slip rug tape.  Remove throw rugs and other tripping hazards from the floor. What can I do in the bedroom?  Use night lights.  Make sure that a bedside light is easy to reach.  Do not use oversized bedding that drapes onto the floor.  Have a firm chair that has side arms to use for getting dressed.  Remove throw rugs and other tripping hazards from the floor. What can I do in the kitchen?  Clean up any spills right away.  Avoid walking on wet floors.  Place frequently used items in easy-to-reach places.  If you need to reach for something above  you, use a sturdy step stool that has a grab bar.  Keep electrical cables out of the way.  Do not use floor polish or wax that makes floors slippery. If you have to use wax, make sure that it is non-skid floor wax.  Remove throw rugs and other tripping hazards from the floor. What can I do in the stairways?  Do not leave any items on the stairs.  Make sure that there are handrails on both sides of the stairs. Fix handrails that are broken or loose. Make sure that handrails are as long as the stairways.  Check any carpeting to make sure that it is firmly attached to the stairs. Fix any carpet that is loose or worn.  Avoid having throw rugs at the top or bottom of stairways, or secure the rugs with carpet tape to prevent them from moving.  Make sure that you have a light switch at the top of the stairs and the bottom of the stairs. If you do not have them, have them installed. What are some other fall prevention tips?  Wear closed-toe shoes that fit well and support your feet. Wear shoes that have rubber soles or low heels.  When you use a stepladder, make sure that it is completely opened and that the sides are firmly locked. Have someone hold the ladder while you are using   it. Do not climb a closed stepladder.  Add color or contrast paint or tape to grab bars and handrails in your home. Place contrasting color strips on the first and last steps.  Use mobility aids as needed, such as canes, walkers, scooters, and crutches.  Turn on lights if it is dark. Replace any light bulbs that burn out.  Set up furniture so that there are clear paths. Keep the furniture in the same spot.  Fix any uneven floor surfaces.  Choose a carpet design that does not hide the edge of steps of a stairway.  Be aware of any and all pets.  Review your medicines with your healthcare provider. Some medicines can cause dizziness or changes in blood pressure, which increase your risk of falling. Talk with  your health care provider about other ways that you can decrease your risk of falls. This may include working with a physical therapist or trainer to improve your strength, balance, and endurance. This information is not intended to replace advice given to you by your health care provider. Make sure you discuss any questions you have with your health care provider. Document Released: 10/28/2002 Document Revised: 04/05/2016 Document Reviewed: 12/12/2014 Elsevier Interactive Patient Education  2018 [employer].  

## 2018-10-11 ENCOUNTER — Encounter: Payer: Self-pay | Admitting: Family Medicine

## 2018-10-11 DIAGNOSIS — H9192 Unspecified hearing loss, left ear: Secondary | ICD-10-CM | POA: Insufficient documentation

## 2018-10-11 DIAGNOSIS — R011 Cardiac murmur, unspecified: Secondary | ICD-10-CM | POA: Insufficient documentation

## 2018-10-11 DIAGNOSIS — R2689 Other abnormalities of gait and mobility: Secondary | ICD-10-CM | POA: Insufficient documentation

## 2018-10-11 NOTE — Progress Notes (Signed)
All labs are normal. 

## 2018-10-18 ENCOUNTER — Other Ambulatory Visit: Payer: Self-pay | Admitting: Family Medicine

## 2018-10-20 ENCOUNTER — Other Ambulatory Visit: Payer: Self-pay | Admitting: Family Medicine

## 2018-10-24 ENCOUNTER — Ambulatory Visit (INDEPENDENT_AMBULATORY_CARE_PROVIDER_SITE_OTHER): Payer: Medicare Other

## 2018-10-24 DIAGNOSIS — Z1231 Encounter for screening mammogram for malignant neoplasm of breast: Secondary | ICD-10-CM

## 2018-11-06 DIAGNOSIS — M1712 Unilateral primary osteoarthritis, left knee: Secondary | ICD-10-CM | POA: Diagnosis not present

## 2018-11-06 DIAGNOSIS — Z96651 Presence of right artificial knee joint: Secondary | ICD-10-CM | POA: Diagnosis not present

## 2018-11-06 DIAGNOSIS — M1711 Unilateral primary osteoarthritis, right knee: Secondary | ICD-10-CM | POA: Diagnosis not present

## 2018-11-20 ENCOUNTER — Other Ambulatory Visit: Payer: Self-pay | Admitting: Sports Medicine

## 2018-11-25 ENCOUNTER — Other Ambulatory Visit: Payer: Self-pay | Admitting: Family Medicine

## 2018-12-14 ENCOUNTER — Other Ambulatory Visit: Payer: Self-pay | Admitting: Orthopaedic Surgery

## 2018-12-26 ENCOUNTER — Other Ambulatory Visit: Payer: Self-pay | Admitting: Family Medicine

## 2019-01-04 ENCOUNTER — Other Ambulatory Visit: Payer: Self-pay | Admitting: Orthopaedic Surgery

## 2019-01-04 NOTE — Patient Instructions (Signed)
Shawn Dannenberg  01/04/2019   Your procedure is scheduled on: 01-15-2019    Report to [facility] Main  Entrance      Report to admitting at 10:00AM    Call this number if you have problems the morning of surgery [phone]     Remember: Do not eat food or drink liquids :After Midnight. BRUSH YOUR TEETH MORNING OF SURGERY AND RINSE YOUR MOUTH OUT, NO CHEWING GUM CANDY OR MINTS.     Take these medicines the morning of surgery with A SIP OF WATER: AMLODIPINE, DULOXETINE                                You may not have any metal on your body including hair pins and              piercings  Do not wear jewelry, make-up, lotions, powders or perfumes, deodorant             Do not wear nail polish.  Do not shave  48 hours prior to surgery.     Do not bring valuables to the hospital. [place].  Contacts, dentures or bridgework may not be worn into surgery.  Leave suitcase in the car. After surgery it may be brought to your room.                  Please read over the following fact sheets you were given: _____________________________________________________________________             [facility] - Preparing for Surgery Before surgery, you can play an important role.  Because skin is not sterile, your skin needs to be as free of germs as possible.  You can reduce the number of germs on your skin by washing with CHG (chlorahexidine gluconate) soap before surgery.  CHG is an antiseptic cleaner which kills germs and bonds with the skin to continue killing germs even after washing. Please DO NOT use if you have an allergy to CHG or antibacterial soaps.  If your skin becomes reddened/irritated stop using the CHG and inform your nurse when you arrive at Short Stay. Do not shave (including legs and underarms) for at least 48 hours prior to the first CHG shower.  You may shave your face/neck. Please follow these  instructions carefully:  1.  Shower with CHG Soap the night before surgery and the  morning of Surgery.  2.  If you choose to wash your hair, wash your hair first as usual with your  normal  shampoo.  3.  After you shampoo, rinse your hair and body thoroughly to remove the  shampoo.                           4.  Use CHG as you would any other liquid soap.  You can apply chg directly  to the skin and wash                       Gently with a scrungie or clean washcloth.  5.  Apply the CHG Soap to your body ONLY FROM THE NECK DOWN.   Do not use on face/ open  Wound or open sores. Avoid contact with eyes, ears mouth and genitals (private parts).                       Wash face,  Genitals (private parts) with your normal soap.             6.  Wash thoroughly, paying special attention to the area where your surgery  will be performed.  7.  Thoroughly rinse your body with warm water from the neck down.  8.  DO NOT shower/wash with your normal soap after using and rinsing off  the CHG Soap.                9.  Pat yourself dry with a clean towel.            10.  Wear clean pajamas.            11.  Place clean sheets on your bed the night of your first shower and do not  sleep with pets. Day of Surgery : Do not apply any lotions/deodorants the morning of surgery.  Please wear clean clothes to the hospital/surgery center.  FAILURE TO FOLLOW THESE INSTRUCTIONS MAY RESULT IN THE CANCELLATION OF YOUR SURGERY PATIENT SIGNATURE_________________________________  NURSE SIGNATURE__________________________________  ________________________________________________________________________   Adam Phenix  An incentive spirometer is a tool that can help keep your lungs clear and active. This tool measures how well you are filling your lungs with each breath. Taking long deep breaths may help reverse or decrease the chance of developing breathing (pulmonary) problems (especially  infection) following:  A long period of time when you are unable to move or be active. BEFORE THE PROCEDURE   If the spirometer includes an indicator to show your best effort, your nurse or respiratory therapist will set it to a desired goal.  If possible, sit up straight or lean slightly forward. Try not to slouch.  Hold the incentive spirometer in an upright position. INSTRUCTIONS FOR USE  1. Sit on the edge of your bed if possible, or sit up as far as you can in bed or on a chair. 2. Hold the incentive spirometer in an upright position. 3. Breathe out normally. 4. Place the mouthpiece in your mouth and seal your lips tightly around it. 5. Breathe in slowly and as deeply as possible, raising the piston or the ball toward the top of the column. 6. Hold your breath for 3-5 seconds or for as long as possible. Allow the piston or ball to fall to the bottom of the column. 7. Remove the mouthpiece from your mouth and breathe out normally. 8. Rest for a few seconds and repeat Steps 1 through 7 at least 10 times every 1-2 hours when you are awake. Take your time and take a few normal breaths between deep breaths. 9. The spirometer may include an indicator to show your best effort. Use the indicator as a goal to work toward during each repetition. 10. After each set of 10 deep breaths, practice coughing to be sure your lungs are clear. If you have an incision (the cut made at the time of surgery), support your incision when coughing by placing a pillow or rolled up towels firmly against it. Once you are able to get out of bed, walk around indoors and cough well. You may stop using the incentive spirometer when instructed by your caregiver.  RISKS AND COMPLICATIONS  Take your time so you do not get  dizzy or light-headed.  If you are in pain, you may need to take or ask for pain medication before doing incentive spirometry. It is harder to take a deep breath if you are having pain. AFTER  USE  Rest and breathe slowly and easily.  It can be helpful to keep track of a log of your progress. Your caregiver can provide you with a simple table to help with this. If you are using the spirometer at home, follow these instructions: [place] IF:   You are having difficultly using the spirometer.  You have trouble using the spirometer as often as instructed.  Your pain medication is not giving enough relief while using the spirometer.  You develop fever of 100.5 F (38.1 C) or higher. SEEK IMMEDIATE MEDICAL CARE IF:   You cough up bloody sputum that had not been present before.  You develop fever of 102 F (38.9 C) or greater.  You develop worsening pain at or near the incision site. MAKE SURE YOU:   Understand these instructions.  Will watch your condition.  Will get help right away if you are not doing well or get worse. Document Released: 03/20/2007 Document Revised: 01/30/2012 Document Reviewed: 05/21/2007 [facility] Patient Information 2014 [place], [place].   ________________________________________________________________________

## 2019-01-07 ENCOUNTER — Other Ambulatory Visit: Payer: Self-pay

## 2019-01-07 ENCOUNTER — Encounter (HOSPITAL_COMMUNITY)
Admission: RE | Admit: 2019-01-07 | Discharge: 2019-01-07 | Disposition: A | Discharge status: Home / Self Care | Payer: Medicare Other | Source: Ambulatory Visit | Attending: Orthopaedic Surgery | Admitting: Orthopaedic Surgery

## 2019-01-07 ENCOUNTER — Encounter (HOSPITAL_COMMUNITY): Payer: Self-pay

## 2019-01-07 ENCOUNTER — Telehealth: Payer: Self-pay | Admitting: Family Medicine

## 2019-01-07 DIAGNOSIS — Z01818 Encounter for other preprocedural examination: Secondary | ICD-10-CM

## 2019-01-07 DIAGNOSIS — R011 Cardiac murmur, unspecified: Secondary | ICD-10-CM | POA: Diagnosis not present

## 2019-01-07 DIAGNOSIS — Z01812 Encounter for preprocedural laboratory examination: Secondary | ICD-10-CM

## 2019-01-07 HISTORY — DX: Cardiac murmur, unspecified: R01.1

## 2019-01-07 LAB — URINALYSIS, ROUTINE W REFLEX MICROSCOPIC
Bilirubin Urine: NEGATIVE
GLUCOSE, UA: NEGATIVE mg/dL
Hgb urine dipstick: NEGATIVE
Ketones, ur: NEGATIVE mg/dL
Nitrite: NEGATIVE
Protein, ur: NEGATIVE mg/dL
Specific Gravity, Urine: 1.015 (ref 1.005–1.030)
pH: 6 (ref 5.0–8.0)

## 2019-01-07 LAB — SURGICAL PCR SCREEN
MRSA, PCR: NEGATIVE
Staphylococcus aureus: NEGATIVE

## 2019-01-07 LAB — CBC WITH DIFFERENTIAL/PLATELET
Abs Immature Granulocytes: 0.03 10*3/uL (ref 0.00–0.07)
Basophils Absolute: 0 10*3/uL (ref 0.0–0.1)
Basophils Relative: 0 %
EOS ABS: 0.2 10*3/uL (ref 0.0–0.5)
EOS PCT: 2 %
HEMATOCRIT: 40.9 % (ref 36.0–46.0)
HEMOGLOBIN: 12.7 g/dL (ref 12.0–15.0)
Immature Granulocytes: 0 %
LYMPHS PCT: 28 %
Lymphs Abs: 2.1 10*3/uL (ref 0.7–4.0)
MCH: 30.5 pg (ref 26.0–34.0)
MCHC: 31.1 g/dL (ref 30.0–36.0)
MCV: 98.1 fL (ref 80.0–100.0)
Monocytes Absolute: 0.8 10*3/uL (ref 0.1–1.0)
Monocytes Relative: 10 %
Neutro Abs: 4.5 10*3/uL (ref 1.7–7.7)
Neutrophils Relative %: 60 %
Platelets: 210 10*3/uL (ref 150–400)
RBC: 4.17 MIL/uL (ref 3.87–5.11)
RDW: 14.1 % (ref 11.5–15.5)
WBC: 7.6 10*3/uL (ref 4.0–10.5)
nRBC: 0 % (ref 0.0–0.2)

## 2019-01-07 LAB — APTT: aPTT: 37 seconds — ABNORMAL HIGH (ref 24–36)

## 2019-01-07 LAB — BASIC METABOLIC PANEL
Anion gap: 8 (ref 5–15)
BUN: 18 mg/dL (ref 8–23)
CO2: 30 mmol/L (ref 22–32)
Calcium: 10 mg/dL (ref 8.9–10.3)
Chloride: 103 mmol/L (ref 98–111)
Creatinine, Ser: 0.86 mg/dL (ref 0.44–1.00)
GFR calc Af Amer: >60 mL/min (ref >60)
GFR calc non Af Amer: >60 mL/min (ref >60)
Glucose, Bld: 91 mg/dL (ref 70–99)
Potassium: 2.9 mmol/L — ABNORMAL LOW (ref 3.5–5.1)
Sodium: 141 mmol/L (ref 135–145)

## 2019-01-07 LAB — PROTIME-INR
INR: 1.02
Prothrombin Time: 13.4 seconds (ref 11.4–15.2)

## 2019-01-07 LAB — ABO/RH: ABO/RH(D): O POS

## 2019-01-07 NOTE — Telephone Encounter (Signed)
Given to [facility] to check for pre auth, she will then give to [facility] ([facility] for scheduling.[name], [place]ahoma Crocker, CMA

## 2019-01-07 NOTE — Telephone Encounter (Signed)
Katherine Nixon with Dr Collier Salina Daldorf's office([facility]) called.  Katherine Nixon is getting a left knee total knee replacement on 2/25 but it was noted by Pre-Op lab that she never got her 2 D Echo done. The patient says nobody reached out to her Thank you.

## 2019-01-09 ENCOUNTER — Ambulatory Visit (HOSPITAL_BASED_OUTPATIENT_CLINIC_OR_DEPARTMENT_OTHER)
Admission: RE | Admit: 2019-01-09 | Discharge: 2019-01-09 | Disposition: A | Discharge status: Home / Self Care | Payer: Medicare Other | Source: Ambulatory Visit | Attending: Family Medicine | Admitting: Family Medicine

## 2019-01-09 DIAGNOSIS — R011 Cardiac murmur, unspecified: Secondary | ICD-10-CM

## 2019-01-09 NOTE — Progress Notes (Signed)
  Echocardiogram 2D Echocardiogram has been performed.  Katherine Nixon 01/09/2019, 1:53 PM

## 2019-01-10 NOTE — Progress Notes (Addendum)
Anesthesia Chart Review   Case:  [id number] Date/Time:  01/15/19 1213   Procedure:  TOTAL KNEE ARTHROPLASTY (Left )   Anesthesia type:  Spinal   Pre-op diagnosis:  Left Knee Arthroplasty   Location:  [facility] ROOM 06 / [facility]   Surgeon:  Melrose Nakayama, MD      DISCUSSION:73 yo former smoker (5 pack years) with h/o depression, anxiety, HTN, Lupus, reports dysrhythmias, left knee OA scheduled for above procedure 01/15/19 with Dr. Melrose Nakayama.    Pt seen by PCP, Dr. Minna Merritts, on 10/10/18.  At this visit pt complained of palpitations.  Echo ordered, completed 01/09/19 with no abnormal findings, EF 60-65%.    Potassium at PAT visit on 01/07/19 2.9.  Dr. Rhona Raider made aware.  Will recheck DOS.  VS: BP 137/60   Pulse 65   Temp 36.7 C (Oral)   Resp 18   SpO2 100%   PROVIDERS: Hali Marry, MD is PCP    LABS: Labs reviewed: Acceptable for surgery. (all labs ordered are listed, but only abnormal results are displayed)  Labs Reviewed  APTT - Abnormal; Notable for the following components:      Result Value   aPTT 37 (*)    All other components within normal limits  BASIC METABOLIC PANEL - Abnormal; Notable for the following components:   Potassium 2.9 (*)    All other components within normal limits  URINALYSIS, ROUTINE W REFLEX MICROSCOPIC - Abnormal; Notable for the following components:   Leukocytes,Ua SMALL (*)    Bacteria, UA RARE (*)    All other components within normal limits  SURGICAL PCR SCREEN  CBC WITH DIFFERENTIAL/PLATELET  PROTIME-INR  TYPE AND SCREEN  ABO/RH     IMAGES:   EKG: 03/13/2018 Rate 83 bpm Sinus rhythm with premature ventricular complexes Otherwise normal ECG  CV: Echo 01/09/2019 FINDINGS  Left Ventricle: The left ventricle has normal systolic function, with an ejection fraction of 60-65%. The cavity size was normal. There is borderline increase in left ventricular wall thickness. Left ventricular diastolic parameters were  normal Right Ventricle: The right ventricle has normal systolic function. The cavity was normal. There is no increase in right ventricular wall thickness. Left Atrium: left atrial size was normal in size Right Atrium: right atrial size was normal in size. Right atrial pressure is estimated at 15 mmHg. Interatrial Septum: No atrial level shunt detected by color flow Doppler. Pericardium: There is no evidence of pericardial effusion. Mitral Valve: The mitral valve is normal in structure. Mitral valve regurgitation is trivial by color flow Doppler. Tricuspid Valve: The tricuspid valve is normal in structure. Tricuspid valve regurgitation is mild by color flow Doppler. Aortic Valve: The aortic valve is tricuspid Aortic valve regurgitation is trivial by color flow Doppler. Pulmonic Valve: The pulmonic valve was normal in structure. Pulmonic valve regurgitation is not visualized by color flow Doppler. Venous: The inferior vena cava measures 2.26 cm, is normal in size with greater than 50% respiratory variability. Past Medical History:  Diagnosis Date  . Anxiety   . Basal cell carcinoma 01/2018   "upper lip"  . Chronic diarrhea   . Complication of anesthesia 1989   diff to wake up with gallbaldder surgery- no problems since (03/12/2018)  . Connective tissue disorder ([place])    "lupus"  . Depression   . Dysrhythmia     states at times, last occurence was january 2019 feels like heart fluttering , last a couple seconds, denies any associated cardiac symtpoms. rpeorts  at her last annual check up her pcp [facility]  heard it on pysical  exam and ordred an ECHO , per patietn no one reached out to her to set it up   . Heart murmur   . High cholesterol   . History of blood transfusion 1970   "related to childbirth"  . Hypertension   . IBS (irritable bowel syndrome)   . Migraine    "none since ~ 1980" (03/12/2018)  . Neuromuscular disorder (HCC)    lupus  . Osteoarthritis    "all my joints" (03/12/2018)   . Squamous cell carcinoma    "right arm"    Past Surgical History:  Procedure Laterality Date  . BASAL CELL CARCINOMA EXCISION  01/2018   "upper lip"  . BREAST BIOPSY Left     neg  . BREAST CYST ASPIRATION Right   . CATARACT EXTRACTION W/ INTRAOCULAR LENS  IMPLANT, BILATERAL    . CHOLECYSTECTOMY OPEN  1989  . COLONOSCOPY    . DILATION AND CURETTAGE OF UTERUS    . JOINT REPLACEMENT    . KNEE ARTHROSCOPY Bilateral   . NASAL SINUS SURGERY     "severe infection"  . SHOULDER ARTHROSCOPY Left 12/2014   Dr. Tamera Punt   . SQUAMOUS CELL CARCINOMA EXCISION Right    arm  . TOTAL HIP ARTHROPLASTY Right 08/31/2016   Procedure: TOTAL HIP ARTHROPLASTY ANTERIOR APPROACH;  Surgeon: Frederik Pear, MD;  Location: [place];  Service: Orthopedics;  Laterality: Right;  . TOTAL KNEE ARTHROPLASTY Right 03/12/2018   Procedure: TOTAL KNEE ARTHROPLASTY;  Surgeon: Frederik Pear, MD;  Location: [facility];  Service: Orthopedics;  Laterality: Right;  . TOTAL KNEE ARTHROPLASTY    . VARICOSE VEIN SURGERY Bilateral     MEDICATIONS: . amLODipine (NORVASC) 5 MG tablet  . atorvastatin (LIPITOR) 20 MG tablet  . DULoxetine (CYMBALTA) 60 MG capsule  . hydrochlorothiazide (HYDRODIURIL) 25 MG tablet  . ibuprofen (ADVIL,MOTRIN) 200 MG tablet  . linaclotide (LINZESS) 72 MCG capsule  . Multiple Vitamin (MULTIVITAMIN WITH MINERALS) TABS tablet  . tiZANidine (ZANAFLEX) 2 MG tablet  . traZODone (DESYREL) 100 MG tablet   No current facility-administered medications for this encounter.     Maia Plan [facility] Pre-Surgical Testing [phone] 01/10/19 2:01 PM

## 2019-01-11 ENCOUNTER — Other Ambulatory Visit: Payer: Self-pay | Admitting: Orthopaedic Surgery

## 2019-01-11 DIAGNOSIS — M1712 Unilateral primary osteoarthritis, left knee: Secondary | ICD-10-CM | POA: Diagnosis not present

## 2019-01-11 NOTE — H&P (Signed)
TOTAL KNEE ADMISSION H&P  Patient is being admitted for left total knee arthroplasty.  Subjective:  Chief Complaint:left knee pain.  HPI: Katherine Nixon, 73 y.o. female, has a history of pain and functional disability in the left knee due to arthritis and has failed non-surgical conservative treatments for greater than 12 weeks to includeNSAID's and/or analgesics, corticosteriod injections, flexibility and strengthening excercises, supervised PT with diminished ADL's post treatment, use of assistive devices, weight reduction as appropriate and activity modification.  Onset of symptoms was gradual, starting 5 years ago with gradually worsening course since that time. The patient noted no past surgery on the left knee(s).  Patient currently rates pain in the left knee(s) at 10 out of 10 with activity. Patient has night pain, worsening of pain with activity and weight bearing, pain that interferes with activities of daily living, crepitus and joint swelling.  Patient has evidence of subchondral cysts, subchondral sclerosis, periarticular osteophytes and joint space narrowing by imaging studies. There is no active infection.  Patient Active Problem List   Diagnosis Date Noted  . 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  . Balance problem 10/11/2018  . Heart murmur 10/11/2018  . Chronic idiopathic constipation 05/03/2018  . Pain of right sacroiliac joint 04/04/2018  . Primary osteoarthritis of right knee 03/12/2018  . Anxiety state 03/12/2018  . Chest pain syndrome 03/12/2018  . Degenerative arthritis of left knee 03/08/2018  . Irritable bowel syndrome with both constipation and diarrhea 12/26/2017  . Primary localized osteoarthritis of right hip 08/31/2016  . Arthritis of right hip 08/31/2016  . Constipation, chronic 03/10/2016  . Elevated alkaline phosphatase level 11/24/2015  . Primary osteoarthritis of both first carpometacarpal joints 09/22/2015  . Right shoulder pain 02/23/2015  .  Chronic pain syndrome 02/06/2015  . Bilateral carpal tunnel syndrome 01/19/2015  . Dyssomnia 06/15/2014  . Left shoulder pain 06/15/2014  . Dry mouth 06/15/2014  . DDD (degenerative disc disease), lumbar 06/15/2014  . Insomnia 04/30/2014  . Actinic porokeratosis 02/27/2014  . ANA positive 01/24/2014  . History of arthroscopy of left shoulder 01/17/2014  . Strain of gastrocnemius tendon of right lower extremity 12/16/2013  . Osteoarthritis of both knees 11/11/2013  . Lumbar degenerative disc disease 11/11/2013  . Osteoarthritis, hip, bilateral 10/14/2013  . Lupus (systemic lupus erythematosus) ([place]) 05/29/2013  . Metatarsalgia of left foot 12/24/2012  . Chronic venous insufficiency 12/14/2012  . Hyperlipemia 06/02/2011  . Atrophic vaginitis 06/01/2011  . Essential hypertension, benign 05/09/2011  . GERD (gastroesophageal reflux disease) 05/09/2011  . Varicose veins 05/09/2011  . Urinary incontinence 03/25/2011   Past Medical History:  Diagnosis Date  . Anxiety   . Basal cell carcinoma 01/2018   "upper lip"  . Chronic diarrhea   . Complication of anesthesia 1989   diff to wake up with gallbaldder surgery- no problems since (03/12/2018)  . Connective tissue disorder ([place])    "lupus"  . Depression   . Dysrhythmia     states at times, last occurence was january 2019 feels like heart fluttering , last a couple seconds, denies any associated cardiac symtpoms. rpeorts at her last annual check up her pcp Metheney  heard it on pysical  exam and ordred an ECHO , per patietn no one reached out to her to set it up   . Heart murmur   . High cholesterol   . History of blood transfusion 1970   "related to childbirth"  . Hypertension   . IBS (irritable bowel syndrome)   .  Migraine    "none since ~ 1980" (03/12/2018)  . Neuromuscular disorder ([facility])    lupus  . Osteoarthritis    "all my joints" (03/12/2018)  . Squamous cell carcinoma    "right arm"    Past Surgical History:  Procedure  Laterality Date  . BASAL CELL CARCINOMA EXCISION  01/2018   "upper lip"  . BREAST BIOPSY Left     neg  . BREAST CYST ASPIRATION Right   . CATARACT EXTRACTION W/ INTRAOCULAR LENS  IMPLANT, BILATERAL    . CHOLECYSTECTOMY OPEN  1989  . COLONOSCOPY    . DILATION AND CURETTAGE OF UTERUS    . JOINT REPLACEMENT    . KNEE ARTHROSCOPY Bilateral   . NASAL SINUS SURGERY     "severe infection"  . SHOULDER ARTHROSCOPY Left 12/2014   Dr. Tamera Punt   . SQUAMOUS CELL CARCINOMA EXCISION Right    arm  . TOTAL HIP ARTHROPLASTY Right 08/31/2016   Procedure: TOTAL HIP ARTHROPLASTY ANTERIOR APPROACH;  Surgeon: Frederik Pear, MD;  Location: [facility];  Service: Orthopedics;  Laterality: Right;  . TOTAL KNEE ARTHROPLASTY Right 03/12/2018   Procedure: TOTAL KNEE ARTHROPLASTY;  Surgeon: Frederik Pear, MD;  Location: [facility];  Service: Orthopedics;  Laterality: Right;  . TOTAL KNEE ARTHROPLASTY    . VARICOSE VEIN SURGERY Bilateral     No current facility-administered medications for this encounter.    Current Outpatient Medications  Medication Sig Dispense Refill Last Dose  . amLODipine (NORVASC) 5 MG tablet TAKE 1 TABLET DAILY (Patient taking differently: Take 5 mg by mouth daily. ) 90 tablet 4   . atorvastatin (LIPITOR) 20 MG tablet Take 1 tablet (20 mg total) by mouth daily. Due for labs in April (Patient taking differently: Take 20 mg by mouth daily at 6 PM. Due for labs in April) 90 tablet 0   . DULoxetine (CYMBALTA) 60 MG capsule Take 1 capsule (60 mg total) by mouth daily. 90 capsule 3 Taking  . hydrochlorothiazide (HYDRODIURIL) 25 MG tablet TAKE 1 TABLET DAILY (Patient taking differently: Take 12.5 mg by mouth daily at 6 PM. ) 90 tablet 4   . ibuprofen (ADVIL,MOTRIN) 200 MG tablet Take 600 mg by mouth 2 (two) times daily.     Marland Kitchen linaclotide (LINZESS) 72 MCG capsule Take 1 capsule (72 mcg total) by mouth daily. (Patient taking differently: Take 72 mcg by mouth daily at 6 PM. ) 90 capsule 3 Taking  . Multiple  Vitamin (MULTIVITAMIN WITH MINERALS) TABS tablet Take 1 tablet by mouth daily.    Taking  . tiZANidine (ZANAFLEX) 2 MG tablet TAKE 1 TABLET EVERY 6 HOURS AS NEEDED (Patient taking differently: Take 2 mg by mouth daily. **MAXIMUM DAILY DOSE = 36 MG**) 180 tablet 8   . traZODone (DESYREL) 100 MG tablet TAKE 1 TABLET AT BEDTIME (Patient taking differently: Take 100 mg by mouth at bedtime. ) 30 tablet 5    Allergies  Allergen Reactions  . Gabapentin Other (See Comments)    Hallucinations   . Tramadol Other (See Comments)    Hallucinations   . Tylenol [Acetaminophen] Other (See Comments)    Due to liver enzyme issue  . Pravastatin Other (See Comments)    Muscle cramping  . Sulfamethoxazole-Trimethoprim Other (See Comments)    Thrush in mouth    Social History   Tobacco Use  . Smoking status: Former Smoker    Packs/day: 1.00    Years: 5.00    Pack years: 5.00    Types: Cigarettes  Last attempt to quit: 1989    Years since quitting: 31.1  . Smokeless tobacco: Never Used  Substance Use Topics  . Alcohol use: No    Family History  Problem Relation Age of Onset  . Heart disease Mother   . Heart attack Mother   . Hyperlipidemia Father   . Other Father        amputaiton  . Hyperlipidemia Brother   . Hypertension Brother      Review of Systems  Musculoskeletal: Positive for joint pain.       Left knee  All other systems reviewed and are negative.   Objective:  Physical Exam  Constitutional: She is oriented to person, place, and time. She appears well-developed and well-nourished.  HENT:  Head: Normocephalic and atraumatic.  Eyes: Pupils are equal, round, and reactive to light.  Neck: Normal range of motion.  Cardiovascular: Normal rate.  Respiratory: Effort normal.  GI: Soft.  Musculoskeletal:     Comments: Examination of the left knee shows range of motion from 0-120 of flexion.  She has tenderness to palpation along the joint line.  No real effusion.  1+  crepitation.  She is neurovascularly intact distally.  Neurological: She is alert and oriented to person, place, and time.  Skin: Skin is warm and dry.  Psychiatric: She has a normal mood and affect. Her behavior is normal. Judgment and thought content normal.    Vital signs in last 24 hours:    Labs:   Estimated body mass index is 31.03 kg/m as calculated from the following:   Height as of 10/10/18: 5' 3.68" (1.617 m).   Weight as of 10/10/18: 81.2 kg.   Imaging Review Plain radiographs demonstrate severe degenerative joint disease of the left knee(s). The overall alignment isneutral. The bone quality appears to be good for age and reported activity level.      Assessment/Plan:  End stage primary arthritis, left knee   The patient history, physical examination, clinical judgment of the provider and imaging studies are consistent with end stage degenerative joint disease of the left knee(s) and total knee arthroplasty is deemed medically necessary. The treatment options including medical management, injection therapy arthroscopy and arthroplasty were discussed at length. The risks and benefits of total knee arthroplasty were presented and reviewed. The risks due to aseptic loosening, infection, stiffness, patella tracking problems, thromboembolic complications and other imponderables were discussed. The patient acknowledged the explanation, agreed to proceed with the plan and consent was signed. Patient is being admitted for inpatient treatment for surgery, pain control, PT, OT, prophylactic antibiotics, VTE prophylaxis, progressive ambulation and ADL's and discharge planning. The patient is planning to be discharged home with home health services     Patient's anticipated LOS is less than 2 midnights, meeting these requirements: - Younger than 6 - Lives within 1 hour of care - Has a competent adult at home to recover with post-op recover - NO history of  - Chronic pain  requiring opiods  - Diabetes  - Coronary Artery Disease  - Heart failure  - Heart attack  - Stroke  - DVT/VTE  - Cardiac arrhythmia  - Respiratory Failure/COPD  - Renal failure  - Anemia  - Advanced Liver disease

## 2019-01-11 NOTE — Care Plan (Signed)
Spoke with patient prior to surgery. She would prefer to go straight to OPPT. I have arranged it at [facility]. 01/18/19. She has her equipment at home from prior surgery. Her family will be there to assist.   If she should need HHPT - referral will be to [facility] - [facility]. She used them with her other surgery.    Ladell Heads, [place]

## 2019-01-14 MED ORDER — TRANEXAMIC ACID 1000 MG/10ML IV SOLN
2000.0000 mg | INTRAVENOUS | Status: AC
  Filled 2019-01-14: qty 20

## 2019-01-14 MED ORDER — BUPIVACAINE LIPOSOME 1.3 % IJ SUSP
20.0000 mL | Freq: Once | INTRAMUSCULAR | Status: DC
  Filled 2019-01-14: qty 20

## 2019-01-14 NOTE — Anesthesia Preprocedure Evaluation (Addendum)
Anesthesia Evaluation  Patient identified by MRN, date of birth, ID band Patient awake    Reviewed: Allergy & Precautions, NPO status , Patient's Chart, lab work & pertinent test results  Airway Mallampati: I  TM Distance: >3 FB Neck ROM: Full    Dental   Pulmonary former smoker,    Pulmonary exam normal        Cardiovascular hypertension, Pt. on medications Normal cardiovascular exam     Neuro/Psych Anxiety Depression    GI/Hepatic GERD  Medicated and Controlled,  Endo/Other    Renal/GU      Musculoskeletal   Abdominal   Peds  Hematology   Anesthesia Other Findings   Reproductive/Obstetrics                            Anesthesia Physical Anesthesia Plan  ASA: II  Anesthesia Plan: Spinal   Post-op Pain Management:  Regional for Post-op pain   Induction:   PONV Risk Score and Plan: 2 and Ondansetron  Airway Management Planned: Simple Face Mask  Additional Equipment:   Intra-op Plan:   Post-operative Plan:   Informed Consent: I have reviewed the patients History and Physical, chart, labs and discussed the procedure including the risks, benefits and alternatives for the proposed anesthesia with the patient or authorized representative who has indicated his/her understanding and acceptance.       Plan Discussed with: CRNA and Surgeon  Anesthesia Plan Comments: (See PAT note 01/07/19, Konrad Felix, PA-C)       Anesthesia Quick Evaluation

## 2019-01-15 ENCOUNTER — Encounter (HOSPITAL_COMMUNITY)
Admission: RE | Disposition: A | Discharge status: Home / Self Care | Payer: Self-pay | Source: Home / Self Care | Attending: Orthopaedic Surgery

## 2019-01-15 ENCOUNTER — Other Ambulatory Visit: Payer: Self-pay

## 2019-01-15 ENCOUNTER — Observation Stay (HOSPITAL_COMMUNITY)
Admission: RE | Admit: 2019-01-15 | Discharge: 2019-01-16 | Disposition: A | Discharge status: Home / Self Care | Payer: Medicare Other | Attending: Orthopaedic Surgery | Admitting: Orthopaedic Surgery

## 2019-01-15 ENCOUNTER — Ambulatory Visit (HOSPITAL_COMMUNITY): Payer: Medicare Other | Admitting: Physician Assistant

## 2019-01-15 ENCOUNTER — Encounter (HOSPITAL_COMMUNITY): Payer: Self-pay | Admitting: *Deleted

## 2019-01-15 DIAGNOSIS — Z888 Allergy status to other drugs, medicaments and biological substances status: Secondary | ICD-10-CM | POA: Insufficient documentation

## 2019-01-15 DIAGNOSIS — Z85828 Personal history of other malignant neoplasm of skin: Secondary | ICD-10-CM | POA: Diagnosis not present

## 2019-01-15 DIAGNOSIS — M170 Bilateral primary osteoarthritis of knee: Principal | ICD-10-CM | POA: Insufficient documentation

## 2019-01-15 DIAGNOSIS — I110 Hypertensive heart disease with heart failure: Secondary | ICD-10-CM | POA: Insufficient documentation

## 2019-01-15 DIAGNOSIS — F329 Major depressive disorder, single episode, unspecified: Secondary | ICD-10-CM | POA: Insufficient documentation

## 2019-01-15 DIAGNOSIS — M1712 Unilateral primary osteoarthritis, left knee: Secondary | ICD-10-CM | POA: Diagnosis not present

## 2019-01-15 DIAGNOSIS — Z8249 Family history of ischemic heart disease and other diseases of the circulatory system: Secondary | ICD-10-CM | POA: Insufficient documentation

## 2019-01-15 DIAGNOSIS — Z79899 Other long term (current) drug therapy: Secondary | ICD-10-CM | POA: Diagnosis not present

## 2019-01-15 DIAGNOSIS — F419 Anxiety disorder, unspecified: Secondary | ICD-10-CM | POA: Insufficient documentation

## 2019-01-15 DIAGNOSIS — R262 Difficulty in walking, not elsewhere classified: Secondary | ICD-10-CM | POA: Insufficient documentation

## 2019-01-15 DIAGNOSIS — Z96641 Presence of right artificial hip joint: Secondary | ICD-10-CM | POA: Diagnosis not present

## 2019-01-15 DIAGNOSIS — Z791 Long term (current) use of non-steroidal anti-inflammatories (NSAID): Secondary | ICD-10-CM | POA: Insufficient documentation

## 2019-01-15 DIAGNOSIS — I10 Essential (primary) hypertension: Secondary | ICD-10-CM | POA: Diagnosis not present

## 2019-01-15 DIAGNOSIS — K219 Gastro-esophageal reflux disease without esophagitis: Secondary | ICD-10-CM | POA: Diagnosis not present

## 2019-01-15 DIAGNOSIS — Z886 Allergy status to analgesic agent status: Secondary | ICD-10-CM | POA: Diagnosis not present

## 2019-01-15 DIAGNOSIS — K582 Mixed irritable bowel syndrome: Secondary | ICD-10-CM | POA: Insufficient documentation

## 2019-01-15 DIAGNOSIS — Z882 Allergy status to sulfonamides status: Secondary | ICD-10-CM | POA: Insufficient documentation

## 2019-01-15 DIAGNOSIS — Z87891 Personal history of nicotine dependence: Secondary | ICD-10-CM | POA: Diagnosis not present

## 2019-01-15 DIAGNOSIS — M329 Systemic lupus erythematosus, unspecified: Secondary | ICD-10-CM | POA: Diagnosis not present

## 2019-01-15 DIAGNOSIS — E7800 Pure hypercholesterolemia, unspecified: Secondary | ICD-10-CM | POA: Insufficient documentation

## 2019-01-15 DIAGNOSIS — E785 Hyperlipidemia, unspecified: Secondary | ICD-10-CM | POA: Diagnosis not present

## 2019-01-15 DIAGNOSIS — R011 Cardiac murmur, unspecified: Secondary | ICD-10-CM | POA: Insufficient documentation

## 2019-01-15 HISTORY — PX: TOTAL KNEE ARTHROPLASTY: SHX125

## 2019-01-15 LAB — TYPE AND SCREEN
ABO/RH(D): O POS
Antibody Screen: NEGATIVE

## 2019-01-15 LAB — BASIC METABOLIC PANEL
Anion gap: 8 (ref 5–15)
BUN: 15 mg/dL (ref 8–23)
CO2: 28 mmol/L (ref 22–32)
Calcium: 9.7 mg/dL (ref 8.9–10.3)
Chloride: 104 mmol/L (ref 98–111)
Creatinine, Ser: 0.78 mg/dL (ref 0.44–1.00)
GFR calc non Af Amer: >60 mL/min (ref >60)
Glucose, Bld: 88 mg/dL (ref 70–99)
Potassium: 3.1 mmol/L — ABNORMAL LOW (ref 3.5–5.1)
Sodium: 140 mmol/L (ref 135–145)

## 2019-01-15 SURGERY — ARTHROPLASTY, KNEE, TOTAL
Anesthesia: General | Site: Knee | Laterality: Left

## 2019-01-15 MED ORDER — DEXAMETHASONE SODIUM PHOSPHATE 10 MG/ML IJ SOLN
INTRAMUSCULAR | Status: AC
  Filled 2019-01-15: qty 1

## 2019-01-15 MED ORDER — ONDANSETRON HCL 4 MG/2ML IJ SOLN: 4.0000 mg | Freq: Four times a day (QID) | INTRAMUSCULAR | Status: DC | PRN

## 2019-01-15 MED ORDER — PROMETHAZINE HCL 25 MG/ML IJ SOLN: 6.2500 mg | INTRAMUSCULAR | Status: DC | PRN

## 2019-01-15 MED ORDER — LACTATED RINGERS IV SOLN
INTRAVENOUS | Status: DC
  Administered 2019-01-15: 11:00:00 via INTRAVENOUS

## 2019-01-15 MED ORDER — GLYCOPYRROLATE 0.2 MG/ML IJ SOLN
INTRAMUSCULAR | Status: DC | PRN
  Administered 2019-01-15: 0.2 mg via INTRAVENOUS

## 2019-01-15 MED ORDER — HYDROCHLOROTHIAZIDE 25 MG PO TABS
12.5000 mg | ORAL_TABLET | Freq: Every day | ORAL | Status: DC
  Administered 2019-01-15: 12.5 mg via ORAL
  Filled 2019-01-15: qty 1

## 2019-01-15 MED ORDER — TRANEXAMIC ACID 1000 MG/10ML IV SOLN
INTRAVENOUS | Status: DC | PRN
  Administered 2019-01-15: 2000 mg via TOPICAL

## 2019-01-15 MED ORDER — KETOROLAC TROMETHAMINE 15 MG/ML IJ SOLN
7.5000 mg | Freq: Four times a day (QID) | INTRAMUSCULAR | Status: AC
  Administered 2019-01-15 – 2019-01-16 (×4): 7.5 mg via INTRAVENOUS
  Filled 2019-01-15 (×4): qty 1

## 2019-01-15 MED ORDER — AMLODIPINE BESYLATE 5 MG PO TABS
5.0000 mg | ORAL_TABLET | Freq: Every day | ORAL | Status: DC
  Administered 2019-01-16: 5 mg via ORAL
  Filled 2019-01-15: qty 1

## 2019-01-15 MED ORDER — GLYCOPYRROLATE PF 0.2 MG/ML IJ SOSY
PREFILLED_SYRINGE | INTRAMUSCULAR | Status: AC
  Filled 2019-01-15: qty 1

## 2019-01-15 MED ORDER — SODIUM CHLORIDE 0.9 % IR SOLN
Status: DC | PRN
  Administered 2019-01-15: 1000 mL

## 2019-01-15 MED ORDER — CEFAZOLIN SODIUM-DEXTROSE 2-4 GM/100ML-% IV SOLN
2.0000 g | Freq: Four times a day (QID) | INTRAVENOUS | Status: AC
  Administered 2019-01-15 – 2019-01-16 (×2): 2 g via INTRAVENOUS
  Filled 2019-01-15 (×2): qty 100

## 2019-01-15 MED ORDER — MEPERIDINE HCL 50 MG/ML IJ SOLN: 6.2500 mg | INTRAMUSCULAR | Status: DC | PRN

## 2019-01-15 MED ORDER — SODIUM CHLORIDE (PF) 0.9 % IJ SOLN
INTRAMUSCULAR | Status: AC
  Filled 2019-01-15: qty 50

## 2019-01-15 MED ORDER — LINACLOTIDE 72 MCG PO CAPS
72.0000 ug | ORAL_CAPSULE | Freq: Every day | ORAL | Status: DC
  Administered 2019-01-16: 72 ug via ORAL
  Filled 2019-01-15: qty 1

## 2019-01-15 MED ORDER — MIDAZOLAM HCL 2 MG/2ML IJ SOLN
1.0000 mg | Freq: Once | INTRAMUSCULAR | Status: AC
  Administered 2019-01-15: 2 mg via INTRAVENOUS
  Filled 2019-01-15: qty 2

## 2019-01-15 MED ORDER — METHOCARBAMOL 500 MG PO TABS
500.0000 mg | ORAL_TABLET | Freq: Four times a day (QID) | ORAL | Status: DC | PRN
  Administered 2019-01-15 – 2019-01-16 (×2): 500 mg via ORAL
  Filled 2019-01-15 (×2): qty 1

## 2019-01-15 MED ORDER — BUPIVACAINE HCL (PF) 0.5 % IJ SOLN
INTRAMUSCULAR | Status: AC
  Filled 2019-01-15: qty 30

## 2019-01-15 MED ORDER — METOCLOPRAMIDE HCL 5 MG/ML IJ SOLN: 5.0000 mg | Freq: Three times a day (TID) | INTRAMUSCULAR | Status: DC | PRN

## 2019-01-15 MED ORDER — MORPHINE SULFATE (PF) 2 MG/ML IV SOLN: 0.5000 mg | INTRAVENOUS | Status: DC | PRN

## 2019-01-15 MED ORDER — CHLORHEXIDINE GLUCONATE 4 % EX LIQD: 60.0000 mL | Freq: Once | CUTANEOUS | Status: DC

## 2019-01-15 MED ORDER — HYDROMORPHONE HCL 1 MG/ML IJ SOLN
0.2500 mg | INTRAMUSCULAR | Status: DC | PRN
  Administered 2019-01-15 (×3): 0.25 mg via INTRAVENOUS

## 2019-01-15 MED ORDER — DOCUSATE SODIUM 100 MG PO CAPS
100.0000 mg | ORAL_CAPSULE | Freq: Two times a day (BID) | ORAL | Status: DC
  Administered 2019-01-15 – 2019-01-16 (×2): 100 mg via ORAL
  Filled 2019-01-15 (×2): qty 1

## 2019-01-15 MED ORDER — PROPOFOL 10 MG/ML IV BOLUS
INTRAVENOUS | Status: AC
  Filled 2019-01-15: qty 60

## 2019-01-15 MED ORDER — PROPOFOL 10 MG/ML IV BOLUS
INTRAVENOUS | Status: DC | PRN
  Administered 2019-01-15: 140 mg via INTRAVENOUS
  Administered 2019-01-15 (×3): 20 mg via INTRAVENOUS

## 2019-01-15 MED ORDER — FENTANYL CITRATE (PF) 100 MCG/2ML IJ SOLN
INTRAMUSCULAR | Status: AC
  Filled 2019-01-15: qty 2

## 2019-01-15 MED ORDER — LACTATED RINGERS IV SOLN
INTRAVENOUS | Status: DC
  Administered 2019-01-15: 17:00:00 via INTRAVENOUS

## 2019-01-15 MED ORDER — TRANEXAMIC ACID-NACL 1000-0.7 MG/100ML-% IV SOLN
1000.0000 mg | INTRAVENOUS | Status: AC
  Administered 2019-01-15: 1000 mg via INTRAVENOUS
  Filled 2019-01-15: qty 100

## 2019-01-15 MED ORDER — MENTHOL 3 MG MT LOZG: 1.0000 | LOZENGE | OROMUCOSAL | Status: DC | PRN

## 2019-01-15 MED ORDER — ONDANSETRON HCL 4 MG/2ML IJ SOLN
INTRAMUSCULAR | Status: AC
  Filled 2019-01-15: qty 2

## 2019-01-15 MED ORDER — ONDANSETRON HCL 4 MG/2ML IJ SOLN
INTRAMUSCULAR | Status: DC | PRN
  Administered 2019-01-15: 4 mg via INTRAVENOUS

## 2019-01-15 MED ORDER — STERILE WATER FOR IRRIGATION IR SOLN
Status: DC | PRN
  Administered 2019-01-15: 2000 mL

## 2019-01-15 MED ORDER — BUPIVACAINE-EPINEPHRINE (PF) 0.5% -1:200000 IJ SOLN
INTRAMUSCULAR | Status: AC
  Filled 2019-01-15: qty 30

## 2019-01-15 MED ORDER — METOCLOPRAMIDE HCL 5 MG PO TABS: 5.0000 mg | ORAL_TABLET | Freq: Three times a day (TID) | ORAL | Status: DC | PRN

## 2019-01-15 MED ORDER — FENTANYL CITRATE (PF) 100 MCG/2ML IJ SOLN
INTRAMUSCULAR | Status: DC | PRN
  Administered 2019-01-15 (×3): 25 ug via INTRAVENOUS
  Administered 2019-01-15 (×2): 50 ug via INTRAVENOUS
  Administered 2019-01-15 (×5): 25 ug via INTRAVENOUS

## 2019-01-15 MED ORDER — SODIUM CHLORIDE 0.9 % IJ SOLN
INTRAMUSCULAR | Status: DC | PRN
  Administered 2019-01-15: 30 mL

## 2019-01-15 MED ORDER — DIPHENHYDRAMINE HCL 12.5 MG/5ML PO ELIX: 12.5000 mg | ORAL_SOLUTION | ORAL | Status: DC | PRN

## 2019-01-15 MED ORDER — DEXAMETHASONE SODIUM PHOSPHATE 10 MG/ML IJ SOLN
INTRAMUSCULAR | Status: DC | PRN
  Administered 2019-01-15: 4 mg via INTRAVENOUS

## 2019-01-15 MED ORDER — EPHEDRINE 5 MG/ML INJ
INTRAVENOUS | Status: AC
  Filled 2019-01-15: qty 10

## 2019-01-15 MED ORDER — SODIUM CHLORIDE 0.9 % IR SOLN
Status: DC | PRN
  Administered 2019-01-15: 3000 mL

## 2019-01-15 MED ORDER — ALUM & MAG HYDROXIDE-SIMETH 200-200-20 MG/5ML PO SUSP: 30.0000 mL | ORAL | Status: DC | PRN

## 2019-01-15 MED ORDER — PHENOL 1.4 % MT LIQD: 1.0000 | OROMUCOSAL | Status: DC | PRN

## 2019-01-15 MED ORDER — FENTANYL CITRATE (PF) 100 MCG/2ML IJ SOLN
50.0000 ug | Freq: Once | INTRAMUSCULAR | Status: AC
  Administered 2019-01-15: 50 ug via INTRAVENOUS
  Filled 2019-01-15: qty 2

## 2019-01-15 MED ORDER — METHOCARBAMOL 500 MG IVPB - SIMPLE MED
500.0000 mg | Freq: Four times a day (QID) | INTRAVENOUS | Status: DC | PRN
  Administered 2019-01-15: 500 mg via INTRAVENOUS
  Filled 2019-01-15: qty 50

## 2019-01-15 MED ORDER — METHOCARBAMOL 500 MG IVPB - SIMPLE MED
INTRAVENOUS | Status: AC
  Filled 2019-01-15: qty 50

## 2019-01-15 MED ORDER — OXYCODONE HCL 5 MG PO TABS
5.0000 mg | ORAL_TABLET | ORAL | Status: DC | PRN
  Administered 2019-01-15: 5 mg via ORAL
  Administered 2019-01-16 (×4): 10 mg via ORAL
  Filled 2019-01-15 (×4): qty 2
  Filled 2019-01-15: qty 1

## 2019-01-15 MED ORDER — ASPIRIN EC 325 MG PO TBEC
325.0000 mg | DELAYED_RELEASE_TABLET | Freq: Two times a day (BID) | ORAL | Status: DC
  Administered 2019-01-16: 325 mg via ORAL
  Filled 2019-01-15: qty 1

## 2019-01-15 MED ORDER — BUPIVACAINE LIPOSOME 1.3 % IJ SUSP
INTRAMUSCULAR | Status: DC | PRN
  Administered 2019-01-15: 20 mL

## 2019-01-15 MED ORDER — CEFAZOLIN SODIUM-DEXTROSE 2-4 GM/100ML-% IV SOLN
2.0000 g | INTRAVENOUS | Status: AC
  Administered 2019-01-15: 2 g via INTRAVENOUS
  Filled 2019-01-15: qty 100

## 2019-01-15 MED ORDER — BUPIVACAINE-EPINEPHRINE (PF) 0.5% -1:200000 IJ SOLN
INTRAMUSCULAR | Status: DC | PRN
  Administered 2019-01-15: 30 mL

## 2019-01-15 MED ORDER — ACETAMINOPHEN 500 MG PO TABS
500.0000 mg | ORAL_TABLET | Freq: Four times a day (QID) | ORAL | Status: DC
  Filled 2019-01-15: qty 1

## 2019-01-15 MED ORDER — TRANEXAMIC ACID-NACL 1000-0.7 MG/100ML-% IV SOLN
1000.0000 mg | Freq: Once | INTRAVENOUS | Status: AC
  Administered 2019-01-15: 1000 mg via INTRAVENOUS
  Filled 2019-01-15: qty 100

## 2019-01-15 MED ORDER — HYDROMORPHONE HCL 1 MG/ML IJ SOLN
INTRAMUSCULAR | Status: AC
  Administered 2019-01-15: 0.25 mg via INTRAVENOUS
  Filled 2019-01-15: qty 1

## 2019-01-15 MED ORDER — BISACODYL 5 MG PO TBEC: 5.0000 mg | DELAYED_RELEASE_TABLET | Freq: Every day | ORAL | Status: DC | PRN

## 2019-01-15 MED ORDER — EPHEDRINE SULFATE-NACL 50-0.9 MG/10ML-% IV SOSY
PREFILLED_SYRINGE | INTRAVENOUS | Status: DC | PRN
  Administered 2019-01-15 (×2): 10 mg via INTRAVENOUS
  Administered 2019-01-15: 5 mg via INTRAVENOUS

## 2019-01-15 MED ORDER — PROPOFOL 500 MG/50ML IV EMUL
INTRAVENOUS | Status: DC | PRN
  Administered 2019-01-15: 25 ug/kg/min via INTRAVENOUS

## 2019-01-15 MED ORDER — TRAZODONE HCL 100 MG PO TABS
100.0000 mg | ORAL_TABLET | Freq: Every day | ORAL | Status: DC
  Administered 2019-01-15: 100 mg via ORAL
  Filled 2019-01-15: qty 1

## 2019-01-15 MED ORDER — ATORVASTATIN CALCIUM 20 MG PO TABS
20.0000 mg | ORAL_TABLET | Freq: Every day | ORAL | Status: DC
  Administered 2019-01-15: 20 mg via ORAL
  Filled 2019-01-15: qty 1

## 2019-01-15 MED ORDER — DULOXETINE HCL 60 MG PO CPEP
60.0000 mg | ORAL_CAPSULE | Freq: Every day | ORAL | Status: DC
  Administered 2019-01-16: 60 mg via ORAL
  Filled 2019-01-15: qty 1

## 2019-01-15 MED ORDER — ONDANSETRON HCL 4 MG PO TABS: 4.0000 mg | ORAL_TABLET | Freq: Four times a day (QID) | ORAL | Status: DC | PRN

## 2019-01-15 SURGICAL SUPPLY — 65 items
ATTUNE MED DOME PAT 38 KNEE (Knees) ×1 IMPLANT
ATTUNE MED DOME PAT 38MM KNEE (Knees) ×1 IMPLANT
ATTUNE PSFEM LTSZ6 NARCEM KNEE (Femur) ×2 IMPLANT
ATTUNE PSRP INSR SZ6 5 KNEE (Insert) ×1 IMPLANT
ATTUNE PSRP INSR SZ6 5MM KNEE (Insert) ×1 IMPLANT
BAG DECANTER FOR FLEXI CONT (MISCELLANEOUS) ×3 IMPLANT
BAG ZIPLOCK 12X15 (MISCELLANEOUS) ×3 IMPLANT
BANDAGE ACE 6X5 VEL STRL LF (GAUZE/BANDAGES/DRESSINGS) ×2 IMPLANT
BASE TIBIA ATTUNE KNEE SYS SZ6 (Knees) IMPLANT
BLADE SAGITTAL 25.0X1.19X90 (BLADE) ×2 IMPLANT
BLADE SAGITTAL 25.0X1.19X90MM (BLADE) ×1
BLADE SAW SGTL 13.0X1.19X90.0M (BLADE) ×3 IMPLANT
BLADE SURG SZ10 CARB STEEL (BLADE) ×6 IMPLANT
BNDG ELASTIC 6X10 VLCR STRL LF (GAUZE/BANDAGES/DRESSINGS) ×1 IMPLANT
BOWL SMART MIX CTS (DISPOSABLE) ×3 IMPLANT
CEMENT HV SMART SET (Cement) ×6 IMPLANT
CHLORAPREP W/TINT 26ML (MISCELLANEOUS) ×4 IMPLANT
COVER SURGICAL LIGHT HANDLE (MISCELLANEOUS) ×3 IMPLANT
COVER WAND RF STERILE (DRAPES) IMPLANT
CUFF TOURN SGL QUICK 34 (TOURNIQUET CUFF) ×4
CUFF TRNQT CYL 34X4.125X (TOURNIQUET CUFF) ×2 IMPLANT
DECANTER SPIKE VIAL GLASS SM (MISCELLANEOUS) ×4 IMPLANT
DRAPE SHEET LG 3/4 BI-LAMINATE (DRAPES) ×3 IMPLANT
DRAPE TOP 10253 STERILE (DRAPES) ×2 IMPLANT
DRAPE U-SHAPE 47X51 STRL (DRAPES) ×3 IMPLANT
DRSG AQUACEL AG ADV 3.5X10 (GAUZE/BANDAGES/DRESSINGS) ×3 IMPLANT
DURAPREP 26ML APPLICATOR (WOUND CARE) ×2 IMPLANT
ELECT REM PT RETURN 15FT ADLT (MISCELLANEOUS) ×3 IMPLANT
GLOVE BIO SURGEON STRL SZ7.5 (GLOVE) ×2 IMPLANT
GLOVE BIO SURGEON STRL SZ8 (GLOVE) ×5 IMPLANT
GLOVE BIOGEL PI IND STRL 7.0 (GLOVE) IMPLANT
GLOVE BIOGEL PI IND STRL 7.5 (GLOVE) IMPLANT
GLOVE BIOGEL PI IND STRL 8 (GLOVE) ×2 IMPLANT
GLOVE BIOGEL PI INDICATOR 7.0 (GLOVE) ×6
GLOVE BIOGEL PI INDICATOR 7.5 (GLOVE) ×2
GLOVE BIOGEL PI INDICATOR 8 (GLOVE) ×6
GOWN STRL REUS W/ TWL LRG LVL3 (GOWN DISPOSABLE) ×1 IMPLANT
GOWN STRL REUS W/ TWL XL LVL3 (GOWN DISPOSABLE) ×2 IMPLANT
GOWN STRL REUS W/TWL LRG LVL3 (GOWN DISPOSABLE) ×2
GOWN STRL REUS W/TWL XL LVL3 (GOWN DISPOSABLE) ×8 IMPLANT
HANDPIECE INTERPULSE COAX TIP (DISPOSABLE) ×2
HOLDER FOLEY CATH W/STRAP (MISCELLANEOUS) ×2 IMPLANT
HOOD PEEL AWAY FLYTE STAYCOOL (MISCELLANEOUS) ×9 IMPLANT
MANIFOLD NEPTUNE II (INSTRUMENTS) ×3 IMPLANT
NDL SAFETY ECLIPSE 18X1.5 (NEEDLE) IMPLANT
NEEDLE HYPO 18GX1.5 SHARP (NEEDLE)
NS IRRIG 1000ML POUR BTL (IV SOLUTION) ×3 IMPLANT
PACK TOTAL KNEE CUSTOM (KITS) ×3 IMPLANT
PAD ARMBOARD 7.5X6 YLW CONV (MISCELLANEOUS) ×3 IMPLANT
PIN STEINMAN FIXATION KNEE (PIN) ×2 IMPLANT
PROTECTOR NERVE ULNAR (MISCELLANEOUS) ×3 IMPLANT
SET HNDPC FAN SPRY TIP SCT (DISPOSABLE) ×1 IMPLANT
STAPLER VISISTAT 35W (STAPLE) IMPLANT
SUT VIC AB 0 CT1 36 (SUTURE) ×3 IMPLANT
SUT VIC AB 2-0 CT1 27 (SUTURE) ×2
SUT VIC AB 2-0 CT1 TAPERPNT 27 (SUTURE) ×1 IMPLANT
SUT VIC AB 3-0 CT1 27 (SUTURE) ×2
SUT VIC AB 3-0 CT1 TAPERPNT 27 (SUTURE) ×1 IMPLANT
SUT VLOC 180 0 24IN GS25 (SUTURE) ×3 IMPLANT
SYR 3ML LL SCALE MARK (SYRINGE) IMPLANT
TIBIA ATTUNE KNEE SYS BASE SZ6 (Knees) ×3 IMPLANT
TRAY CATH 16FR W/PLASTIC CATH (SET/KITS/TRAYS/PACK) ×1 IMPLANT
TRAY FOLEY MTR SLVR 16FR STAT (SET/KITS/TRAYS/PACK) ×1 IMPLANT
WATER STERILE IRR 1000ML POUR (IV SOLUTION) ×3 IMPLANT
WRAP KNEE MAXI GEL POST OP (GAUZE/BANDAGES/DRESSINGS) ×3 IMPLANT

## 2019-01-15 NOTE — Interval H&P Note (Signed)
History and Physical Interval Note:  01/15/2019 11:39 AM  Katherine Nixon  has presented today for surgery, with the diagnosis of Left Knee Arthroplasty  The various methods of treatment have been discussed with the patient and family. After consideration of risks, benefits and other options for treatment, the patient has consented to  Procedure(s): TOTAL KNEE ARTHROPLASTY (Left) as a surgical intervention .  The patient's history has been reviewed, patient examined, no change in status, stable for surgery.  I have reviewed the patient's chart and labs.  Questions were answered to the patient's satisfaction.     Hessie Dibble

## 2019-01-15 NOTE — Anesthesia Procedure Notes (Signed)
Procedure Name: LMA Insertion Date/Time: 01/15/2019 12:59 PM Performed by: Eben Burow, CRNA Pre-anesthesia Checklist: Patient identified, Emergency Drugs available, Suction available, Patient being monitored and Timeout performed Patient Re-evaluated:Patient Re-evaluated prior to induction Oxygen Delivery Method: Circle system utilized Preoxygenation: Pre-oxygenation with 100% oxygen Induction Type: IV induction Ventilation: Mask ventilation without difficulty LMA: LMA inserted LMA Size: 4.0 Tube secured with: Tape Dental Injury: Teeth and Oropharynx as per pre-operative assessment

## 2019-01-15 NOTE — Progress Notes (Signed)
AssistedDr. Ossey with left, ultrasound guided, adductor canal block. Side rails up, monitors on throughout procedure. See vital signs in flow sheet. Tolerated Procedure well.  

## 2019-01-15 NOTE — Care Plan (Signed)
Ortho Bundle Case Management Note  Patient Details  Name: Katherine Nixon MRN: [id number] Date of Birth: 1946/06/10    Spoke with patient prior to surgery. She would prefer to go straight to OPPT. I have arranged it at [facility]. 01/18/19. She has her equipment at home from prior surgery. Her family will be there to assist.   If she should need HHPT - referral will be to [facility] - [facility]. She used them with her other surgery.                  DME Arranged:    DME Agency:     HH Arranged:    HH Agency:     Additional Comments: Please contact me with any questions of if this plan should need to change.  Ladell Heads,  [place] Specialist  [phone] 01/15/2019, 12:01 PM

## 2019-01-15 NOTE — Transfer of Care (Signed)
Immediate Anesthesia Transfer of Care Note  Patient: Katherine Nixon  Procedure(s) Performed: TOTAL KNEE ARTHROPLASTY (Left Knee)  Patient Location: PACU  Anesthesia Type:General  Level of Consciousness: awake, alert  and confused  Airway & Oxygen Therapy: Patient Spontanous Breathing and Patient connected to face mask oxygen  Post-op Assessment: Report given to RN and Post -op Vital signs reviewed and stable  Post vital signs: Reviewed and stable  Last Vitals:  Vitals Value Taken Time  BP 152/113 01/15/2019  2:46 PM  Temp    Pulse 101 01/15/2019  2:50 PM  Resp 15 01/15/2019  2:50 PM  SpO2 100 % 01/15/2019  2:50 PM  Vitals shown include unvalidated device data.  Last Pain:  Vitals:   01/15/19 1039  TempSrc: Oral         Complications: No apparent anesthesia complications

## 2019-01-15 NOTE — Op Note (Signed)
PREOP DIAGNOSIS: DJD LEFT KNEE POSTOP DIAGNOSIS:  same PROCEDURE: LEFT TKR ANESTHESIA: General and block ATTENDING SURGEON: Hessie Dibble ASSISTANT: Loni Dolly PA  INDICATIONS FOR PROCEDURE: Katherine Nixon is a 73 y.o. female who has struggled for a long time with pain due to degenerative arthritis of the left knee.  The patient has failed many conservative non-operative measures and at this point has pain which limits the ability to sleep and walk.  The patient is offered total knee replacement.  Informed operative consent was obtained after discussion of possible risks of anesthesia, infection, neurovascular injury, DVT, and death.  The importance of the post-operative rehabilitation protocol to optimize result was stressed extensively with the patient.  SUMMARY OF FINDINGS AND PROCEDURE:  Katherine Nixon was taken to the operative suite where under the above anesthesia a left knee replacement was performed.  There were advanced degenerative changes and the bone quality was good.  We used the DePuyAttune system and placed size 6 femur, 6 tibia, 38 mm all polyethylene patella, and a size 5 mm spacer.  We did our best to correct her valgus deformity and flexion contracture. Loni Dolly PA-C assisted throughout and was invaluable to the completion of the case in that he helped retract and maintain exposure while I placed the components.  He also helped close thereby minimizing OR time.  The patient was admitted for appropriate post-op care to include perioperative antibiotics and mechanical and pharmacologic measures for DVT prophylaxis.  DESCRIPTION OF PROCEDURE:  Katherine Nixon was taken to the operative suite where the above anesthesia was applied.  The patient was positioned supine and prepped and draped in normal sterile fashion.  An appropriate time out was performed.  After the administration of kefzol pre-op antibiotic the leg was elevated and exsanguinated and a tourniquet  inflated.  A standard longitudinal incision was made on the anterior knee.  Dissection was carried down to the extensor mechanism.  All appropriate anti-infective measures were used including the pre-operative antibiotic, betadine impregnated drape, and closed hooded exhaust systems for each member of the surgical team.  A medial parapatellar incision was made in the extensor mechanism and the knee cap flipped and the knee flexed.  Some residual meniscal tissues were removed along with any remaining ACL/PCL tissue.  A guide was placed on the tibia and a flat cut was made on it's superior surface.  An intramedullary guide was placed in the femur and was utilized to make anterior and posterior cuts creating an appropriate flexion gap.  A second intramedullary guide was placed in the femur to make a distal cut properly balancing the knee with an extension gap equal to the flexion gap.  The three bones sized to the above mentioned sizes and the appropriate guides were placed and utilized.  A trial reduction was done and the knee easily came to full extension and the patella tracked well on flexion.  The trial components were removed and all bones were cleaned with pulsatile lavage and then dried thoroughly.  Cement was mixed and was pressurized onto the bones followed by placement of the aforementioned components.  Excess cement was trimmed and pressure was held on the components until the cement had hardened.  The tourniquet was deflated and a small amount of bleeding was controlled with cautery and pressure.  The knee was irrigated thoroughly.  The extensor mechanism was re-approximated with V-loc suture in running fashion.  The knee was flexed and the repair was solid.  The  subcutaneous tissues were re-approximated with #0 and #2-0 vicryl and the skin closed with a subcuticular stitch and steristrips.  A sterile dressing was applied.  Intraoperative fluids, EBL, and tourniquet time can be obtained from anesthesia  records.  DISPOSITION:  The patient was taken to recovery room in stable condition and admitted for appropriate post-op care to include peri-operative antibiotic and DVT prophylaxis with mechanical and pharmacologic measures.  Monico Blitz Katherine Nixon 01/15/2019, 2:10 PM

## 2019-01-16 DIAGNOSIS — E7800 Pure hypercholesterolemia, unspecified: Secondary | ICD-10-CM | POA: Diagnosis not present

## 2019-01-16 DIAGNOSIS — M170 Bilateral primary osteoarthritis of knee: Secondary | ICD-10-CM | POA: Diagnosis not present

## 2019-01-16 DIAGNOSIS — Z85828 Personal history of other malignant neoplasm of skin: Secondary | ICD-10-CM | POA: Diagnosis not present

## 2019-01-16 DIAGNOSIS — F329 Major depressive disorder, single episode, unspecified: Secondary | ICD-10-CM | POA: Diagnosis not present

## 2019-01-16 DIAGNOSIS — R011 Cardiac murmur, unspecified: Secondary | ICD-10-CM | POA: Diagnosis not present

## 2019-01-16 DIAGNOSIS — F419 Anxiety disorder, unspecified: Secondary | ICD-10-CM | POA: Diagnosis not present

## 2019-01-16 MED ORDER — TIZANIDINE HCL 4 MG PO TABS: 4.0000 mg | ORAL_TABLET | Freq: Every day | ORAL | 1 refills | Status: DC

## 2019-01-16 MED ORDER — ASPIRIN 325 MG PO TBEC: 325.0000 mg | DELAYED_RELEASE_TABLET | Freq: Two times a day (BID) | ORAL | 0 refills | Status: DC

## 2019-01-16 MED ORDER — TIZANIDINE HCL 4 MG PO TABS: 4.0000 mg | ORAL_TABLET | Freq: Four times a day (QID) | ORAL | 1 refills | Status: DC | PRN

## 2019-01-16 MED ORDER — OXYCODONE HCL 5 MG PO TABS: 5.0000 mg | ORAL_TABLET | ORAL | 0 refills | Status: DC | PRN

## 2019-01-16 NOTE — Progress Notes (Signed)
CSW consult: SNF placement  Plan: [place] signing off.    Kathrin Greathouse, Marlinda Mike, MSW Clinical Social Worker  [phone] 01/16/2019  8:52 AM

## 2019-01-16 NOTE — Care Management Note (Signed)
Case Management Note  Patient Details  Name: Katherine Nixon MRN: [id number] Date of Birth: [birth year]  Subjective/Objective:                  discharge planning  Action/Plan: Dme: has 3in 1 and rolling walker hhc- to go to office Friday [id number] for P.T.  Expected Discharge Date:                  Expected Discharge Plan:  Home/Self Care  In-House Referral:     Discharge planning Services  CM Consult  Post Acute Care Choice:    Choice offered to:     DME Arranged:    DME Agency:     HH Arranged:    [place] Agency:     Status of Service:  Completed, signed off  If discussed at [employer] of Stay Meetings, dates discussed:    Additional Comments:  Leeroy Cha, RN 01/16/2019, 9:33 AM

## 2019-01-16 NOTE — Progress Notes (Signed)
Subjective: 1 Day Post-Op Procedure(s) (LRB): TOTAL KNEE ARTHROPLASTY (Left)   Patinet is feeling well. Much less pain that after the other knee. She is looking forward to going home but hesitant about the pain returning.  Activity level:  wbat Diet tolerance:  ok Voiding:  ok Patient reports pain as mild.    Objective: Vital signs in last 24 hours: Temp:  [97.4 F (36.3 C)-98.3 F (36.8 C)] 98.3 F (36.8 C) (02/26 0528) Pulse Rate:  [52-105] 58 (02/26 0528) Resp:  [8-21] 16 (02/26 0528) BP: (97-160)/(44-113) 105/60 (02/26 0528) SpO2:  [94 %-100 %] 94 % (02/26 0528) Weight:  [80.3 kg] 80.3 kg (02/25 1045)  Labs: No results for input(s): HGB in the last 72 hours. No results for input(s): WBC, RBC, HCT, PLT in the last 72 hours. Recent Labs    01/15/19 1014  NA 140  K 3.1*  CL 104  CO2 28  BUN 15  CREATININE 0.78  GLUCOSE 88  CALCIUM 9.7   No results for input(s): LABPT, INR in the last 72 hours.  Physical Exam:  Neurologically intact ABD soft Neurovascular intact Sensation intact distally Intact pulses distally Dorsiflexion/Plantar flexion intact Incision: dressing C/D/I and no drainage No cellulitis present Compartment soft  Assessment/Plan:  1 Day Post-Op Procedure(s) (LRB): TOTAL KNEE ARTHROPLASTY (Left) Advance diet Up with therapy D/C IV fluids Discharge home with home health possibly today after PT depending on how she does. I will touch base mid day to check on her. Continue on ASA 325mg  BID x 2 weeks post op. Follow up in office 2 weeks post op.  Larwance Sachs Jisell Majer 01/16/2019, 7:44 AM

## 2019-01-16 NOTE — Plan of Care (Signed)
  Problem: Activity: Goal: Risk for activity intolerance will decrease Outcome: Progressing   Problem: Pain Managment: Goal: General experience of comfort will improve Outcome: Progressing   Problem: Safety: Goal: Ability to remain free from injury will improve Outcome: Progressing   Problem: Education: Goal: Knowledge of the prescribed therapeutic regimen will improve Outcome: Progressing   Problem: Activity: Goal: Range of joint motion will improve Outcome: Progressing   Problem: Pain Management: Goal: Pain level will decrease with appropriate interventions Outcome: Progressing

## 2019-01-16 NOTE — Care Management Obs Status (Signed)
[place] NOTIFICATION   Patient Details  Name: Lauramae Kneisley MRN: [id number] Date of Birth: 11-Mar-1946   Medicare Observation Status Notification Given:  Yes    Leeroy Cha, RN 01/16/2019, 12:27 PM

## 2019-01-16 NOTE — Progress Notes (Signed)
Physical Therapy Treatment Patient Details Name: Katherine Nixon MRN: [id number] DOB: Sep 22, 1946 Today's Date: 01/16/2019    History of Present Illness Pt s/p L TKR and with hx of lupus, R TKR and R THR    PT Comments    Pt motivated and progressing well with mobility.  Spouse present to review home therex and stairs with written instruction provided.   Follow Up Recommendations  Follow surgeon's recommendation for DC plan and follow-up therapies     Equipment Recommendations  None recommended by PT    Recommendations for Other Services       Precautions / Restrictions Precautions Precautions: Fall Restrictions Weight Bearing Restrictions: No Other Position/Activity Restrictions: WBAT    Mobility  Bed Mobility Overal bed mobility: Needs Assistance Bed Mobility: Sit to Supine     Supine to sit: Min assist Sit to supine: Min guard   General bed mobility comments: min assist for L LE management  Transfers Overall transfer level: Needs assistance Equipment used: Rolling walker (2 wheeled) Transfers: Sit to/from Stand Sit to Stand: Min guard;Supervision         General transfer comment: cues for LE management and use of UEs to self assist.  Ambulation/Gait Ambulation/Gait assistance: Min guard;Supervision Gait Distance (Feet): 150 Feet Assistive device: Rolling walker (2 wheeled) Gait Pattern/deviations: Step-to pattern;Step-through pattern;Decreased step length - right;Decreased step length - left;Shuffle;Trunk flexed Gait velocity: decr   General Gait Details: cues for posture, position from RW and initial sequence   Stairs Stairs: Yes Stairs assistance: Min assist Stair Management: One rail Right;Step to pattern;Forwards;With cane Number of Stairs: 5 General stair comments: cues for sequence and foot/cane placement; spouse present and assisting; written instruction provided   Wheelchair Mobility    Modified Rankin (Stroke Patients Only)        Balance Overall balance assessment: Mild deficits observed, not formally tested                                          Cognition Arousal/Alertness: Awake/alert Behavior During Therapy: WFL for tasks assessed/performed Overall Cognitive Status: Within Functional Limits for tasks assessed                                        Exercises Total Joint Exercises Ankle Circles/Pumps: AROM;Both;15 reps;Supine Quad Sets: AROM;Both;10 reps;Supine Heel Slides: AAROM;Left;15 reps;Supine Straight Leg Raises: AAROM;AROM;Left;15 reps;Supine Long Arc Quad: AROM;Left;10 reps;Seated Knee Flexion: AAROM;AROM;Left;10 reps;Seated    General Comments        Pertinent Vitals/Pain Pain Assessment: 0-10 Pain Score: 4  Pain Location: L knee Pain Descriptors / Indicators: Aching;Sore Pain Intervention(s): Limited activity within patient's tolerance;Monitored during session;Premedicated before session;Ice applied    Home Living Family/patient expects to be discharged to:: Private residence Living Arrangements: Spouse/significant other Available Help at Discharge: Family;Available 24 hours/day Type of Home: House Home Access: Stairs to enter Entrance Stairs-Rails: Right Home Layout: One level Home Equipment: Walker - 2 wheels;Cane - single point;Bedside commode;Shower seat;Wheelchair - manual      Prior Function Level of Independence: Independent      Comments: using SPC as needed   PT Goals (current goals can now be found in the care plan section) Acute Rehab PT Goals Patient Stated Goal: Regain IND PT Goal Formulation: With patient Time For Goal Achievement: 01/23/19 Potential  to Achieve Goals: Good Progress towards PT goals: Progressing toward goals    Frequency    7X/week      PT Plan Current plan remains appropriate    Co-evaluation              AM-PAC PT "6 Clicks" Mobility   Outcome Measure  Help needed turning from your  back to your side while in a flat bed without using bedrails?: A Little Help needed moving from lying on your back to sitting on the side of a flat bed without using bedrails?: A Little Help needed moving to and from a bed to a chair (including a wheelchair)?: A Little Help needed standing up from a chair using your arms (e.g., wheelchair or bedside chair)?: A Little Help needed to walk in hospital room?: A Little Help needed climbing 3-5 steps with a railing? : A Little 6 Click Score: 18    End of Session Equipment Utilized During Treatment: Gait belt Activity Tolerance: Patient tolerated treatment well Patient left: with call bell/phone within reach;with family/visitor present;in bed Nurse Communication: Mobility status PT Visit Diagnosis: Difficulty in walking, not elsewhere classified (R26.2)     Time: 3403-5248 PT Time Calculation (min) (ACUTE ONLY): 33 min  Charges:  $Gait Training: 8-22 mins $Therapeutic Exercise: 8-22 mins                     Debe Coder PT Acute Rehabilitation Services Pager [phone] Office [phone]    Silas Sedam 01/16/2019, 3:08 PM

## 2019-01-16 NOTE — Evaluation (Signed)
Physical Therapy Evaluation Patient Details Name: Katherine Nixon MRN: [id number] DOB: January 09, 1946 Today's Date: 01/16/2019   History of Present Illness  Pt s/p L TKR and with hx of lupus, R TKR and R THR  Clinical Impression  Pt s/p L TKR and presents with decreased L LE strength/ROM and post op pain limiting functional mobility.  Pt should progress to dc home with family assist.    Follow Up Recommendations Follow surgeon's recommendation for DC plan and follow-up therapies    Equipment Recommendations  None recommended by PT    Recommendations for Other Services       Precautions / Restrictions Precautions Precautions: Fall Restrictions Weight Bearing Restrictions: No Other Position/Activity Restrictions: WBAT      Mobility  Bed Mobility Overal bed mobility: Needs Assistance Bed Mobility: Supine to Sit     Supine to sit: Min assist     General bed mobility comments: min assist for L LE management  Transfers Overall transfer level: Needs assistance Equipment used: Rolling walker (2 wheeled) Transfers: Sit to/from Stand Sit to Stand: Min assist;Min guard         General transfer comment: cues for LE management and use of UEs to self assist.  Ambulation/Gait Ambulation/Gait assistance: Min assist;Min guard Gait Distance (Feet): 123 Feet Assistive device: Rolling walker (2 wheeled) Gait Pattern/deviations: Step-to pattern;Step-through pattern;Decreased step length - right;Decreased step length - left;Shuffle;Trunk flexed Gait velocity: decr   General Gait Details: cues for posture, position from RW and initial sequence  Stairs            Wheelchair Mobility    Modified Rankin (Stroke Patients Only)       Balance Overall balance assessment: Mild deficits observed, not formally tested                                           Pertinent Vitals/Pain Pain Assessment: 0-10 Pain Score: 4  Pain Location: L knee Pain  Descriptors / Indicators: Aching;Sore Pain Intervention(s): Limited activity within patient's tolerance;Monitored during session;Premedicated before session;Ice applied    Home Living Family/patient expects to be discharged to:: Private residence Living Arrangements: Spouse/significant other Available Help at Discharge: Family;Available 24 hours/day Type of Home: House Home Access: Stairs to enter Entrance Stairs-Rails: Right Entrance Stairs-Number of Steps: 3 Home Layout: One level Home Equipment: Walker - 2 wheels;Cane - single point;Bedside commode;Shower seat;Wheelchair - manual      Prior Function Level of Independence: Independent         Comments: using SPC as needed     Hand Dominance   Dominant Hand: Right    Extremity/Trunk Assessment   Upper Extremity Assessment Upper Extremity Assessment: Overall WFL for tasks assessed    Lower Extremity Assessment Lower Extremity Assessment: LLE deficits/detail LLE Deficits / Details: 3/5 quads with IND SLR; AAROM at L knee - 8 - 100    Cervical / Trunk Assessment Cervical / Trunk Assessment: Normal  Communication   Communication: No difficulties  Cognition Arousal/Alertness: Awake/alert Behavior During Therapy: WFL for tasks assessed/performed Overall Cognitive Status: Within Functional Limits for tasks assessed                                        General Comments      Exercises Total Joint Exercises Ankle Circles/Pumps:  AROM;Both;15 reps;Supine Quad Sets: AROM;Both;10 reps;Supine Heel Slides: AAROM;Left;15 reps;Supine Straight Leg Raises: AAROM;AROM;Left;15 reps;Supine   Assessment/Plan    PT Assessment Patient needs continued PT services  PT Problem List Decreased strength;Decreased range of motion;Decreased activity tolerance;Decreased mobility;Decreased knowledge of use of DME;Pain       PT Treatment Interventions DME instruction;Gait training;Stair training;Therapeutic  exercise;Therapeutic activities;Functional mobility training;Patient/family education    PT Goals (Current goals can be found in the Care Plan section)  Acute Rehab PT Goals Patient Stated Goal: Regain IND PT Goal Formulation: With patient Time For Goal Achievement: 01/23/19 Potential to Achieve Goals: Good    Frequency 7X/week   Barriers to discharge        Co-evaluation               AM-PAC PT "6 Clicks" Mobility  Outcome Measure Help needed turning from your back to your side while in a flat bed without using bedrails?: A Little Help needed moving from lying on your back to sitting on the side of a flat bed without using bedrails?: A Little Help needed moving to and from a bed to a chair (including a wheelchair)?: A Little Help needed standing up from a chair using your arms (e.g., wheelchair or bedside chair)?: A Little Help needed to walk in hospital room?: A Little Help needed climbing 3-5 steps with a railing? : A Little 6 Click Score: 18    End of Session Equipment Utilized During Treatment: Gait belt Activity Tolerance: Patient tolerated treatment well Patient left: in chair;with call bell/phone within reach;with family/visitor present Nurse Communication: Mobility status PT Visit Diagnosis: Difficulty in walking, not elsewhere classified (R26.2)    Time: 7591-6384 PT Time Calculation (min) (ACUTE ONLY): 27 min   Charges:   PT Evaluation $PT Eval Low Complexity: 1 Low PT Treatments $Therapeutic Exercise: 8-22 mins        Debe Coder PT Acute Rehabilitation Services Pager [phone] Office [phone]   Biruk Troia 01/16/2019, 12:56 PM

## 2019-01-16 NOTE — Discharge Summary (Signed)
Patient ID: Katherine Nixon MRN: [id number] DOB/AGE: [birth year] 73 y.o.  Admit date: 01/15/2019 Discharge date: 01/16/2019  Admission Diagnoses:  Principal Problem:   Primary osteoarthritis of left knee   Discharge Diagnoses:  Same  Past Medical History:  Diagnosis Date  . Anxiety   . Basal cell carcinoma 01/2018   "upper lip"  . Chronic diarrhea   . Complication of anesthesia 1989   diff to wake up with gallbaldder surgery- no problems since (03/12/2018)  . Connective tissue disorder ([place])    "lupus"  . Depression   . Dysrhythmia     states at times, last occurence was january 2019 feels like heart fluttering , last a couple seconds, denies any associated cardiac symtpoms. rpeorts at her last annual check up her pcp Methe[facility]  heard it on pysical  exam and ordred an ECHO , per patietn no one reached out to her to set it up   . Heart murmur   . High cholesterol   . History of blood transfusion 1970   "related to childbirth"  . Hypertension   . IBS (irritable bowel syndrome)   . Migraine    "none since ~ 1980" (03/12/2018)  . Neuromuscular disorder ([facility])    lupus  . Osteoarthritis    "all my joints" (03/12/2018)  . Squamous cell carcinoma    "right arm"    Surgeries: Procedure(s): TOTAL KNEE ARTHROPLASTY on 01/15/2019   Consultants:   Discharged Condition: Improved  Hospital Course: Katherine Nixon is an 73 y.o. female who was admitted 01/15/2019 for operative treatment ofPrimary osteoarthritis of left knee. Patient has severe unremitting pain that affects sleep, daily activities, and work/hobbies. After pre-op clearance the patient was taken to the operating room on 01/15/2019 and underwent  Procedure(s): TOTAL KNEE ARTHROPLASTY.    Patient was given perioperative antibiotics:  Anti-infectives (From admission, onward)   Start     Dose/Rate Route Frequency Ordered Stop   01/15/19 1900  ceFAZolin (ANCEF) IVPB 2g/100 mL premix     2 g 200 mL/hr over 30 Minutes  Intravenous Every 6 hours 01/15/19 1627 01/16/19 0035   01/15/19 1015  ceFAZolin (ANCEF) IVPB 2g/100 mL premix     2 g 200 mL/hr over 30 Minutes Intravenous On call to O.R. 01/15/19 1014 01/15/19 1308       Patient was given sequential compression devices, early ambulation, and chemoprophylaxis to prevent DVT.  Patient benefited maximally from hospital stay and there were no complications.    Recent vital signs:  Patient Vitals for the past 24 hrs:  BP Temp Temp src Pulse Resp SpO2  01/16/19 0934 (!) 103/42 97.8 F (36.6 C) Oral (!) 55 16 97 %  01/16/19 0528 105/60 98.3 F (36.8 C) Oral (!) 58 16 94 %  01/15/19 2346 (!) 135/47 98.1 F (36.7 C) Oral (!) 53 16 99 %  01/15/19 2144 (!) 114/45 97.7 F (36.5 C) Oral (!) 52 16 97 %  01/15/19 1937 107/66 97.7 F (36.5 C) Oral 60 16 100 %  01/15/19 1828 (!) 116/44 (!) 97.4 F (36.3 C) Oral 65 16 99 %  01/15/19 1732 (!) 134/47 97.6 F (36.4 C) - 66 18 99 %  01/15/19 1629 139/60 (!) 97.4 F (36.3 C) Oral 74 11 99 %  01/15/19 1600 134/90 (!) 97.5 F (36.4 C) - 68 16 100 %  01/15/19 1545 (!) 132/49 - - 76 12 100 %  01/15/19 1530 (!) 139/53 - - 72 12 100 %  01/15/19 1515 (!)  160/62 - - 78 12 100 %  01/15/19 1500 (!) 151/75 - - 85 (!) 21 100 %  01/15/19 1446 (!) 152/113 (!) 97.5 F (36.4 C) - (!) 105 13 100 %     Recent laboratory studies:  Recent Labs    01/15/19 1014  NA 140  K 3.1*  CL 104  CO2 28  BUN 15  CREATININE 0.78  GLUCOSE 88  CALCIUM 9.7     Discharge Medications:   Allergies as of 01/16/2019      Reactions   Gabapentin Other (See Comments)   Hallucinations   Tramadol Other (See Comments)   Hallucinations   Tylenol [acetaminophen] Other (See Comments)   Due to liver enzyme issue   Pravastatin Other (See Comments)   Muscle cramping   Sulfamethoxazole-trimethoprim Other (See Comments)   Thrush in mouth      Medication List    STOP taking these medications   ibuprofen 200 MG tablet Commonly known as:   ADVIL,MOTRIN     TAKE these medications   amLODipine 5 MG tablet Commonly known as:  NORVASC TAKE 1 TABLET DAILY   aspirin 325 MG EC tablet Take 1 tablet (325 mg total) by mouth 2 (two) times daily after a meal.   atorvastatin 20 MG tablet Commonly known as:  LIPITOR Take 1 tablet (20 mg total) by mouth daily. Due for labs in April What changed:  when to take this   DULoxetine 60 MG capsule Commonly known as:  CYMBALTA Take 1 capsule (60 mg total) by mouth daily.   hydrochlorothiazide 25 MG tablet Commonly known as:  HYDRODIURIL TAKE 1 TABLET DAILY What changed:    how much to take  when to take this   linaclotide 72 MCG capsule Commonly known as:  LINZESS Take 1 capsule (72 mcg total) by mouth daily. What changed:  when to take this   multivitamin with minerals Tabs tablet Take 1 tablet by mouth daily.   oxyCODONE 5 MG immediate release tablet Commonly known as:  Oxy IR/ROXICODONE Take 1-2 tablets (5-10 mg total) by mouth every 4 (four) hours as needed for moderate pain.   tiZANidine 4 MG tablet Commonly known as:  ZANAFLEX Take 1 tablet (4 mg total) by mouth daily. What changed:  You were already taking a medication with the same name, and this prescription was added. Make sure you understand how and when to take each.   tiZANidine 4 MG tablet Commonly known as:  ZANAFLEX Take 1 tablet (4 mg total) by mouth every 6 (six) hours as needed. What changed:    medication strength  how much to take   traZODone 100 MG tablet Commonly known as:  DESYREL TAKE 1 TABLET AT BEDTIME            Durable Medical Equipment  (From admission, onward)         Start     Ordered   01/15/19 1628  DME Walker rolling  Once    Question:  Patient needs a walker to treat with the following condition  Answer:  Primary osteoarthritis of left knee   01/15/19 1627   01/15/19 1628  DME 3 n 1  Once     01/15/19 1627   01/15/19 1628  DME Bedside commode  Once    Question:   Patient needs a bedside commode to treat with the following condition  Answer:  Primary osteoarthritis of left knee   01/15/19 1627  Diagnostic Studies: No results found.  Disposition: Discharge disposition: 01-Home or Self Care       Discharge Instructions    Call MD / Call 911   Complete by:  As directed    If you experience chest pain or shortness of breath, CALL 911 and be transported to the hospital emergency room.  If you develope a fever above 101 F, pus (white drainage) or increased drainage or redness at the wound, or calf pain, call your surgeon's office.   Constipation Prevention   Complete by:  As directed    Drink plenty of fluids.  Prune juice may be helpful.  You may use a stool softener, such as Colace (over the counter) 100 mg twice a day.  Use MiraLax (over the counter) for constipation as needed.   Diet - low sodium heart healthy   Complete by:  As directed    Discharge instructions   Complete by:  As directed    INSTRUCTIONS AFTER JOINT REPLACEMENT   Remove items at home which could result in a fall. This includes throw rugs or furniture in walking pathways ICE to the affected joint every three hours while awake for 30 minutes at a time, for at least the first 3-5 days, and then as needed for pain and swelling.  Continue to use ice for pain and swelling. You may notice swelling that will progress down to the foot and ankle.  This is normal after surgery.  Elevate your leg when you are not up walking on it.   Continue to use the breathing machine you got in the hospital (incentive spirometer) which will help keep your temperature down.  It is common for your temperature to cycle up and down following surgery, especially at night when you are not up moving around and exerting yourself.  The breathing machine keeps your lungs expanded and your temperature down.   DIET:  As you were doing prior to hospitalization, we recommend a well-balanced  diet.  DRESSING / WOUND CARE / SHOWERING  You may shower 3 days after surgery, but keep the wounds dry during showering.  You may use an occlusive plastic wrap (Press'n Seal for example), NO SOAKING/SUBMERGING IN THE BATHTUB.  If the bandage gets wet, change with a clean dry gauze.  If the incision gets wet, pat the wound dry with a clean towel.  ACTIVITY  Increase activity slowly as tolerated, but follow the weight bearing instructions below.   No driving for 6 weeks or until further direction given by your physician.  You cannot drive while taking narcotics.  No lifting or carrying greater than 10 lbs. until further directed by your surgeon. Avoid periods of inactivity such as sitting longer than an hour when not asleep. This helps prevent blood clots.  You may return to work once you are authorized by your doctor.     WEIGHT BEARING   Weight bearing as tolerated with assist device (walker, cane, etc) as directed, use it as long as suggested by your surgeon or therapist, typically at least 4-6 weeks.   EXERCISES  Results after joint replacement surgery are often greatly improved when you follow the exercise, range of motion and muscle strengthening exercises prescribed by your doctor. Safety measures are also important to protect the joint from further injury. Any time any of these exercises cause you to have increased pain or swelling, decrease what you are doing until you are comfortable again and then slowly increase them. If you have problems  or questions, call your caregiver or physical therapist for advice.   Rehabilitation is important following a joint replacement. After just a few days of immobilization, the muscles of the leg can become weakened and shrink (atrophy).  These exercises are designed to build up the tone and strength of the thigh and leg muscles and to improve motion. Often times heat used for twenty to thirty minutes before working out will loosen up your tissues  and help with improving the range of motion but do not use heat for the first two weeks following surgery (sometimes heat can increase post-operative swelling).   These exercises can be done on a training (exercise) mat, on the floor, on a table or on a bed. Use whatever works the best and is most comfortable for you.    Use music or television while you are exercising so that the exercises are a pleasant break in your day. This will make your life better with the exercises acting as a break in your routine that you can look forward to.   Perform all exercises about fifteen times, three times per day or as directed.  You should exercise both the operative leg and the other leg as well.   Exercises include:   Quad Sets - Tighten up the muscle on the front of the thigh (Quad) and hold for 5-10 seconds.   Straight Leg Raises - With your knee straight (if you were given a brace, keep it on), lift the leg to 60 degrees, hold for 3 seconds, and slowly lower the leg.  Perform this exercise against resistance later as your leg gets stronger.  Leg Slides: Lying on your back, slowly slide your foot toward your buttocks, bending your knee up off the floor (only go as far as is comfortable). Then slowly slide your foot back down until your leg is flat on the floor again.  Angel Wings: Lying on your back spread your legs to the side as far apart as you can without causing discomfort.  Hamstring Strength:  Lying on your back, push your heel against the floor with your leg straight by tightening up the muscles of your buttocks.  Repeat, but this time bend your knee to a comfortable angle, and push your heel against the floor.  You may put a pillow under the heel to make it more comfortable if necessary.   A rehabilitation program following joint replacement surgery can speed recovery and prevent re-injury in the future due to weakened muscles. Contact your doctor or a physical therapist for more information on knee  rehabilitation.    CONSTIPATION  Constipation is defined medically as fewer than three stools per week and severe constipation as less than one stool per week.  Even if you have a regular bowel pattern at home, your normal regimen is likely to be disrupted due to multiple reasons following surgery.  Combination of anesthesia, postoperative narcotics, change in appetite and fluid intake all can affect your bowels.   YOU MUST use at least one of the following options; they are listed in order of increasing strength to get the job done.  They are all available over the counter, and you may need to use some, POSSIBLY even all of these options:    Drink plenty of fluids (prune juice may be helpful) and high fiber foods Colace 100 mg by mouth twice a day  Senokot for constipation as directed and as needed Dulcolax (bisacodyl), take with full glass of water  Miralax (  polyethylene glycol) once or twice a day as needed.  If you have tried all these things and are unable to have a bowel movement in the first 3-4 days after surgery call either your surgeon or your primary doctor.    If you experience loose stools or diarrhea, hold the medications until you stool forms back up.  If your symptoms do not get better within 1 week or if they get worse, check with your doctor.  If you experience "the worst abdominal pain ever" or develop nausea or vomiting, please contact the office immediately for further recommendations for treatment.   ITCHING:  If you experience itching with your medications, try taking only a single pain pill, or even half a pain pill at a time.  You can also use Benadryl over the counter for itching or also to help with sleep.   TED HOSE STOCKINGS:  Use stockings on both legs until for at least 2 weeks or as directed by physician office. They may be removed at night for sleeping.  MEDICATIONS:  See your medication summary on the "After Visit Summary" that nursing will review with you.   You may have some home medications which will be placed on hold until you complete the course of blood thinner medication.  It is important for you to complete the blood thinner medication as prescribed.  PRECAUTIONS:  If you experience chest pain or shortness of breath - call 911 immediately for transfer to the hospital emergency department.   If you develop a fever greater that 101 F, purulent drainage from wound, increased redness or drainage from wound, foul odor from the wound/dressing, or calf pain - CONTACT YOUR SURGEON.                                                   FOLLOW-UP APPOINTMENTS:  If you do not already have a post-op appointment, please call the office for an appointment to be seen by your surgeon.  Guidelines for how soon to be seen are listed in your "After Visit Summary", but are typically between 1-4 weeks after surgery.  OTHER INSTRUCTIONS:   Knee Replacement:  Do not place pillow under knee, focus on keeping the knee straight while resting. CPM instructions: 0-90 degrees, 2 hours in the morning, 2 hours in the afternoon, and 2 hours in the evening. Place foam block, curve side up under heel at all times except when in CPM or when walking.  DO NOT modify, tear, cut, or change the foam block in any way.  MAKE SURE YOU:  Understand these instructions.  Get help right away if you are not doing well or get worse.    Thank you for letting us be a part of your medical care team.  It is a privilege we respect greatly.  We hope these instructions will help you stay on track for a fast and full recovery!   Increase activity slowly as tolerated   Complete by:  As directed       Follow-up Information    Melrose Nakayama, MD. Go on 01/25/2019.   Specialty:  Orthopedic Surgery Why:  You appointment is scheduled for 10:30 Contact information: [place] [address] [phone]        [facility] Go on 01/18/2019.   Why:  You arew  scheduled  to start outpatient physical therapy at 9:00.  Please arrive a few minutes early to complete your paperwork.  Contact information: [facility] [place] [address] [phone]        Melrose Nakayama, MD. Schedule an appointment as soon as possible for a visit in 2 weeks.   Specialty:  Orthopedic Surgery Contact information: [place] [address] [phone]            Signed: Larwance Sachs Tacoya Altizer 01/16/2019, 1:34 PM

## 2019-01-16 NOTE — Progress Notes (Signed)
Patient discharged to home w/ family. Given all belongings, instructions. Patient and family verbalized understanding. Escorted to pov via w/c.

## 2019-01-16 NOTE — Anesthesia Postprocedure Evaluation (Signed)
Anesthesia Post Note  Patient: Katherine Nixon  Procedure(s) Performed: TOTAL KNEE ARTHROPLASTY (Left Knee)     Patient location during evaluation: PACU Anesthesia Type: General Level of consciousness: awake and alert Pain management: pain level controlled Vital Signs Assessment: post-procedure vital signs reviewed and stable Respiratory status: spontaneous breathing, nonlabored ventilation, respiratory function stable and patient connected to nasal cannula oxygen Cardiovascular status: blood pressure returned to baseline and stable Postop Assessment: no apparent nausea or vomiting Anesthetic complications: no    Last Vitals:  Vitals:   01/16/19 0528 01/16/19 0934  BP: 105/60 (!) 103/42  Pulse: (!) 58 (!) 55  Resp: 16 16  Temp: 36.8 C 36.6 C  SpO2: 94% 97%    Last Pain:  Vitals:   01/16/19 0934  TempSrc: Oral  PainSc:                  Arsh Feutz DAVID

## 2019-01-17 ENCOUNTER — Encounter (HOSPITAL_COMMUNITY): Payer: Self-pay | Admitting: Orthopaedic Surgery

## 2019-01-18 DIAGNOSIS — Z471 Aftercare following joint replacement surgery: Secondary | ICD-10-CM | POA: Diagnosis not present

## 2019-01-18 DIAGNOSIS — Z96653 Presence of artificial knee joint, bilateral: Secondary | ICD-10-CM | POA: Diagnosis not present

## 2019-01-18 DIAGNOSIS — R262 Difficulty in walking, not elsewhere classified: Secondary | ICD-10-CM | POA: Diagnosis not present

## 2019-01-21 DIAGNOSIS — Z471 Aftercare following joint replacement surgery: Secondary | ICD-10-CM | POA: Diagnosis not present

## 2019-01-21 DIAGNOSIS — R262 Difficulty in walking, not elsewhere classified: Secondary | ICD-10-CM | POA: Diagnosis not present

## 2019-01-21 DIAGNOSIS — Z96653 Presence of artificial knee joint, bilateral: Secondary | ICD-10-CM | POA: Diagnosis not present

## 2019-01-23 DIAGNOSIS — Z471 Aftercare following joint replacement surgery: Secondary | ICD-10-CM | POA: Diagnosis not present

## 2019-01-23 DIAGNOSIS — R262 Difficulty in walking, not elsewhere classified: Secondary | ICD-10-CM | POA: Diagnosis not present

## 2019-01-23 DIAGNOSIS — Z96653 Presence of artificial knee joint, bilateral: Secondary | ICD-10-CM | POA: Diagnosis not present

## 2019-01-25 DIAGNOSIS — M25562 Pain in left knee: Secondary | ICD-10-CM | POA: Diagnosis not present

## 2019-01-25 DIAGNOSIS — Z96659 Presence of unspecified artificial knee joint: Secondary | ICD-10-CM | POA: Diagnosis not present

## 2019-01-29 DIAGNOSIS — R262 Difficulty in walking, not elsewhere classified: Secondary | ICD-10-CM | POA: Diagnosis not present

## 2019-01-29 DIAGNOSIS — Z471 Aftercare following joint replacement surgery: Secondary | ICD-10-CM | POA: Diagnosis not present

## 2019-01-29 DIAGNOSIS — Z96653 Presence of artificial knee joint, bilateral: Secondary | ICD-10-CM | POA: Diagnosis not present

## 2019-01-31 DIAGNOSIS — R262 Difficulty in walking, not elsewhere classified: Secondary | ICD-10-CM | POA: Diagnosis not present

## 2019-01-31 DIAGNOSIS — Z471 Aftercare following joint replacement surgery: Secondary | ICD-10-CM | POA: Diagnosis not present

## 2019-01-31 DIAGNOSIS — Z96653 Presence of artificial knee joint, bilateral: Secondary | ICD-10-CM | POA: Diagnosis not present

## 2019-02-05 DIAGNOSIS — Z96653 Presence of artificial knee joint, bilateral: Secondary | ICD-10-CM | POA: Diagnosis not present

## 2019-02-05 DIAGNOSIS — Z471 Aftercare following joint replacement surgery: Secondary | ICD-10-CM | POA: Diagnosis not present

## 2019-02-05 DIAGNOSIS — R262 Difficulty in walking, not elsewhere classified: Secondary | ICD-10-CM | POA: Diagnosis not present

## 2019-02-07 DIAGNOSIS — Z471 Aftercare following joint replacement surgery: Secondary | ICD-10-CM | POA: Diagnosis not present

## 2019-02-07 DIAGNOSIS — R262 Difficulty in walking, not elsewhere classified: Secondary | ICD-10-CM | POA: Diagnosis not present

## 2019-02-07 DIAGNOSIS — Z96653 Presence of artificial knee joint, bilateral: Secondary | ICD-10-CM | POA: Diagnosis not present

## 2019-02-12 DIAGNOSIS — Z96653 Presence of artificial knee joint, bilateral: Secondary | ICD-10-CM | POA: Diagnosis not present

## 2019-02-12 DIAGNOSIS — Z471 Aftercare following joint replacement surgery: Secondary | ICD-10-CM | POA: Diagnosis not present

## 2019-02-12 DIAGNOSIS — R262 Difficulty in walking, not elsewhere classified: Secondary | ICD-10-CM | POA: Diagnosis not present

## 2019-02-14 DIAGNOSIS — Z96653 Presence of artificial knee joint, bilateral: Secondary | ICD-10-CM | POA: Diagnosis not present

## 2019-02-14 DIAGNOSIS — Z471 Aftercare following joint replacement surgery: Secondary | ICD-10-CM | POA: Diagnosis not present

## 2019-02-14 DIAGNOSIS — R262 Difficulty in walking, not elsewhere classified: Secondary | ICD-10-CM | POA: Diagnosis not present

## 2019-02-27 ENCOUNTER — Other Ambulatory Visit: Payer: Self-pay | Admitting: Family Medicine

## 2019-03-26 ENCOUNTER — Other Ambulatory Visit: Payer: Self-pay | Admitting: Family Medicine

## 2019-04-02 ENCOUNTER — Other Ambulatory Visit: Payer: Self-pay | Admitting: Sports Medicine

## 2019-04-10 ENCOUNTER — Encounter: Payer: Self-pay | Admitting: Family Medicine

## 2019-04-10 ENCOUNTER — Ambulatory Visit (INDEPENDENT_AMBULATORY_CARE_PROVIDER_SITE_OTHER): Payer: Medicare Other | Admitting: Family Medicine

## 2019-04-10 VITALS — BP 131/69 | HR 67 | Temp 97.6°F | Wt 183.0 lb

## 2019-04-10 DIAGNOSIS — I10 Essential (primary) hypertension: Secondary | ICD-10-CM | POA: Diagnosis not present

## 2019-04-10 DIAGNOSIS — Z1211 Encounter for screening for malignant neoplasm of colon: Secondary | ICD-10-CM

## 2019-04-10 DIAGNOSIS — K5909 Other constipation: Secondary | ICD-10-CM

## 2019-04-10 DIAGNOSIS — R1012 Left upper quadrant pain: Secondary | ICD-10-CM | POA: Diagnosis not present

## 2019-04-10 NOTE — Progress Notes (Signed)
Pt is concerned about a pain in L side x 3 mos. Above waist between her breast and waist on L side she said it is a sharp steady pain and it is whenever she is lying down and sometimes when she's up walking around .Katherine Nixon, [place]

## 2019-04-10 NOTE — Progress Notes (Signed)
Virtual Visit via Video Note  I connected with Katherine Nixon on 04/10/19 at 10:30 AM EDT by a video enabled telemedicine application and verified that I am speaking with the correct person using two identifiers.   I discussed the limitations of evaluation and management by telemedicine and the availability of in person appointments. The patient expressed understanding and agreed to proceed.  Pt was at home and I was in my office for the virtual visit.     Subjective:    CC: BP check   HPI: Hypertension- Pt denies chest pain, SOB, dizziness, or heart palpitations.  Taking meds as directed w/o problems.  Denies medication side effects.  Occ has heart palpitations.    Pt is concerned about a pain in L side x 3 mos. Above waist between her breast and waist on L side she said it is a sharp steady pain and it is whenever she is lying down and sometimes when she's up walking around.  It can wake her up at times.  Notices it more at night.  She uses a heating pad but doesn't really help.  She doesn't thin it is gas.  No weight loss.    Chronic constipation. Takes the linzess but can't take it every days as it causes diarrhea.  Takes every other day .   Past medical history, Surgical history, Family history not pertinant except as noted below, Social history, Allergies, and medications have been entered into the medical record, reviewed, and corrections made.   Review of Systems: No fevers, chills, night sweats, weight loss, chest pain, or shortness of breath.   Objective:    General: Speaking clearly in complete sentences without any shortness of breath.  Alert and oriented x3.  Normal judgment. No apparent acute distress. Well groomed.     Impression and Recommendations:   HTN - well controlled at home. Continue to monitor.  F/U in 6 months.    Chronic constipation - taking her linzess twice a day. No need for refills  Today.   Left upper abdominal pain - it is severe enough that  it wakes her up with is concerning.  Will check pancreas and CBC.  If normal or high then consider imaging.    Due for repeat colon cancer screening this year.  Will place referral [facility]       I discussed the assessment and treatment plan with the patient. The patient was provided an opportunity to ask questions and all were answered. The patient agreed with the plan and demonstrated an understanding of the instructions.   The patient was advised to call back or seek an in-person evaluation if the symptoms worsen or if the condition fails to improve as anticipated.   Beatrice Lecher, MD

## 2019-04-12 DIAGNOSIS — I10 Essential (primary) hypertension: Secondary | ICD-10-CM | POA: Diagnosis not present

## 2019-04-12 DIAGNOSIS — R1012 Left upper quadrant pain: Secondary | ICD-10-CM | POA: Diagnosis not present

## 2019-04-13 LAB — CBC WITH DIFFERENTIAL/PLATELET
Absolute Monocytes: 583 cells/uL (ref 200–950)
Basophils Absolute: 32 cells/uL (ref 0–200)
Basophils Relative: 0.6 %
Eosinophils Absolute: 148 cells/uL (ref 15–500)
Eosinophils Relative: 2.8 %
HCT: 37.9 % (ref 35.0–45.0)
Hemoglobin: 11.9 g/dL (ref 11.7–15.5)
Lymphs Abs: 1458 cells/uL (ref 850–3900)
MCH: 29.1 pg (ref 27.0–33.0)
MCHC: 31.4 g/dL — ABNORMAL LOW (ref 32.0–36.0)
MCV: 92.7 fL (ref 80.0–100.0)
MPV: 10.1 fL (ref 7.5–12.5)
Monocytes Relative: 11 %
Neutro Abs: 3079 cells/uL (ref 1500–7800)
Neutrophils Relative %: 58.1 %
Platelets: 215 10*3/uL (ref 140–400)
RBC: 4.09 10*6/uL (ref 3.80–5.10)
RDW: 13.1 % (ref 11.0–15.0)
Total Lymphocyte: 27.5 %
WBC: 5.3 10*3/uL (ref 3.8–10.8)

## 2019-04-13 LAB — COMPLETE METABOLIC PANEL WITH GFR
AG Ratio: 1.6 (calc) (ref 1.0–2.5)
ALT: 15 U/L (ref 6–29)
AST: 30 U/L (ref 10–35)
Albumin: 4.2 g/dL (ref 3.6–5.1)
Alkaline phosphatase (APISO): 117 U/L (ref 37–153)
BUN: 20 mg/dL (ref 7–25)
CO2: 26 mmol/L (ref 20–32)
Calcium: 10.2 mg/dL (ref 8.6–10.4)
Chloride: 107 mmol/L (ref 98–110)
Creat: 0.89 mg/dL (ref 0.60–0.93)
GFR, Est African American: 75 mL/min/{1.73_m2} (ref >=60)
GFR, Est Non African American: 65 mL/min/{1.73_m2} (ref >=60)
Globulin: 2.7 g/dL (calc) (ref 1.9–3.7)
Glucose, Bld: 91 mg/dL (ref 65–99)
Potassium: 4.1 mmol/L (ref 3.5–5.3)
Sodium: 142 mmol/L (ref 135–146)
Total Bilirubin: 0.3 mg/dL (ref 0.2–1.2)
Total Protein: 6.9 g/dL (ref 6.1–8.1)

## 2019-04-13 LAB — LIPID PANEL
Cholesterol: 169 mg/dL (ref <200)
HDL: 75 mg/dL (ref >=50)
LDL Cholesterol (Calc): 84 mg/dL (calc)
Non-HDL Cholesterol (Calc): 94 mg/dL (calc) (ref <130)
Total CHOL/HDL Ratio: 2.3 (calc) (ref <5.0)
Triglycerides: 36 mg/dL (ref <150)

## 2019-04-13 LAB — LIPASE: Lipase: 29 U/L (ref 7–60)

## 2019-05-01 DIAGNOSIS — Z1211 Encounter for screening for malignant neoplasm of colon: Secondary | ICD-10-CM | POA: Diagnosis not present

## 2019-05-01 DIAGNOSIS — Z538 Procedure and treatment not carried out for other reasons: Secondary | ICD-10-CM | POA: Diagnosis not present

## 2019-05-01 LAB — HM COLONOSCOPY

## 2019-05-10 ENCOUNTER — Encounter: Payer: Self-pay | Admitting: Family Medicine

## 2019-08-23 ENCOUNTER — Other Ambulatory Visit: Payer: Self-pay | Admitting: Sports Medicine

## 2019-10-25 ENCOUNTER — Other Ambulatory Visit: Payer: Self-pay | Admitting: Family Medicine

## 2019-11-19 ENCOUNTER — Encounter: Payer: Self-pay | Admitting: Family Medicine

## 2019-11-19 NOTE — Telephone Encounter (Signed)
Please call patient and find out more detail for why she needs to be tested.  If she symptomatic?  Or possible exposure?  We can help her schedule an appointment to get tested in [place] depending on the situation we may be able to test her here

## 2019-12-10 NOTE — Progress Notes (Deleted)
Subjective:   Leilah Kesner is a 74 y.o. female who presents for Medicare Annual (Subsequent) preventive examination.  Review of Systems:  No ROS.  Medicare Wellness Virtual Visit.  Visual/audio telehealth visit, UTA vital signs.   See social history for additional risk factors.      Sleep patterns:    Home Safety/Smoke Alarms: Feels safe in home. Smoke alarms in place.  Living environment;  Seat Belt Safety/Bike Helmet: Wears seat belt.   Female:   Pap- Aged out       Mammo-  UTD     Dexa scan-        CCS- UTD     Objective:     Vitals: There were no vitals taken for this visit.  There is no height or weight on file to calculate BMI.  Advanced Directives 01/15/2019 01/07/2019 03/12/2018 03/12/2018 03/01/2018 09/01/2016 08/22/2016  Does Patient Have a Medical Advance Directive? Yes Yes Yes No Yes Yes Yes  Type of Advance Directive Living will Living will [place];Living will - - [place];Living will [place]  Does patient want to make changes to medical advance directive? No - Patient declined No - Patient declined No - Patient declined - - No - Patient declined No - Patient declined  Copy of [place] in Chart? - - Yes - - No - copy requested No - copy requested  Would patient like information on creating a medical advance directive? - - - No - Patient declined - - -    Tobacco Social History   Tobacco Use  Smoking Status Former Smoker  . Packs/day: 1.00  . Years: 5.00  . Pack years: 5.00  . Types: Cigarettes  . Quit date: 71  . Years since quitting: 32.0  Smokeless Tobacco Never Used     Counseling given: Not Answered   Clinical Intake:                       Past Medical History:  Diagnosis Date  . Anxiety   . Basal cell carcinoma 01/2018   "upper lip"  . Chronic diarrhea   . Complication of anesthesia 1989   diff to wake up with gallbaldder surgery- no  problems since (03/12/2018)  . Connective tissue disorder ([place])    "lupus"  . Depression   . Dysrhythmia     states at times, last occurence was january 2019 feels like heart fluttering , last a couple seconds, denies any associated cardiac symtpoms. rpeorts at her last annual check up her pcp Metheney  heard it on pysical  exam and ordred an ECHO , per patietn no one reached out to her to set it up   . Heart murmur   . High cholesterol   . History of blood transfusion 1970   "related to childbirth"  . Hypertension   . IBS (irritable bowel syndrome)   . Migraine    "none since ~ 1980" (03/12/2018)  . Neuromuscular disorder (HCC)    lupus  . Osteoarthritis    "all my joints" (03/12/2018)  . Squamous cell carcinoma    "right arm"   Past Surgical History:  Procedure Laterality Date  . BASAL CELL CARCINOMA EXCISION  01/2018   "upper lip"  . BREAST BIOPSY Left     neg  . BREAST CYST ASPIRATION Right   . CATARACT EXTRACTION W/ INTRAOCULAR LENS  IMPLANT, BILATERAL    . CHOLECYSTECTOMY OPEN  1989  . COLONOSCOPY    . DILATION AND CURETTAGE OF UTERUS    . JOINT REPLACEMENT    . KNEE ARTHROSCOPY Bilateral   . NASAL SINUS SURGERY     "severe infection"  . SHOULDER ARTHROSCOPY Left 12/2014   Dr. Tamera Punt   . SQUAMOUS CELL CARCINOMA EXCISION Right    arm  . TOTAL HIP ARTHROPLASTY Right 08/31/2016   Procedure: TOTAL HIP ARTHROPLASTY ANTERIOR APPROACH;  Surgeon: Frederik Pear, MD;  Location: [facility];  Service: Orthopedics;  Laterality: Right;  . TOTAL KNEE ARTHROPLASTY Right 03/12/2018   Procedure: TOTAL KNEE ARTHROPLASTY;  Surgeon: Frederik Pear, MD;  Location: [facility];  Service: Orthopedics;  Laterality: Right;  . TOTAL KNEE ARTHROPLASTY    . TOTAL KNEE ARTHROPLASTY Left 01/15/2019   Procedure: TOTAL KNEE ARTHROPLASTY;  Surgeon: Melrose Nakayama, MD;  [facility];  Service: Orthopedics;  Laterality: Left;  Marland Kitchen VARICOSE VEIN SURGERY Bilateral    Family History  Problem Relation Age of Onset   . Heart disease Mother   . Heart attack Mother   . Hyperlipidemia Father   . Other Father        amputaiton  . Hyperlipidemia Brother   . Hypertension Brother    Social History   Socioeconomic History  . Marital status: Married    Spouse name: Jimmie  . Number of children: 4  . Years of education: 10  . Highest education level: Not on file  Occupational History  . 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 Smoking status: Former Smoker    Packs/day: 1.00    Years: 5.00    Pack years: 5.00    Types: Cigarettes    Quit date: 1989    Years since quitting: 32.0  . Smokeless tobacco: Never Used  Substance and Sexual Activity  . Alcohol use: No  . Drug use: No  . Sexual activity: Not Currently  Other Topics Concern  . Not on file  Social History Narrative  . Not on file   Social Determinants of Health   Financial Resource Strain:   . Difficulty of Paying Living Expenses: Not on file  Food Insecurity:   . Worried About Charity fundraiser in the Last Year: Not on file  . Ran Out of Food in the Last Year: Not on file  Transportation Needs:   . Lack of Transportation (Medical): Not on file  . Lack of Transportation (Non-Medical): Not on file  Physical Activity:   . Days of Exercise per Week: Not on file  . Minutes of Exercise per Session: Not on file  Stress:   . Feeling of Stress : Not on file  Social Connections:   . Frequency of Communication with Friends and Family: Not on file  . Frequency of Social Gatherings with Friends and Family: Not on file  . Attends Religious Services: Not on file  . Active Member of Clubs or Organizations: Not on file  . Attends Archivist Meetings: Not on file  . Marital Status: Not on file    Outpatient Encounter Medications as of 12/16/2019  Medication Sig  . amLODipine (NORVASC) 5 MG tablet TAKE 1 TABLET DAILY (Patient taking differently: Take 5 mg by mouth daily. )  . atorvastatin  (LIPITOR) 20 MG tablet Take 1 tablet (20 mg total) by mouth daily at 6 PM.  . DULoxetine (CYMBALTA) 60 MG capsule TAKE 1 CAPSULE DAILY  . hydrochlorothiazide (HYDRODIURIL) 25 MG tablet  TAKE 1 TABLET DAILY (Patient taking differently: Take 12.5 mg by mouth daily at 6 PM. )  . linaclotide (LINZESS) 72 MCG capsule Take 1 capsule (72 mcg total) by mouth daily. (Patient taking differently: Take 72 mcg by mouth daily at 6 PM. )  . Multiple Vitamin (MULTIVITAMIN WITH MINERALS) TABS tablet Take 1 tablet by mouth daily.   Marland Kitchen tiZANidine (ZANAFLEX) 2 MG tablet Take 2 mg by mouth 2 (two) times daily as needed for muscle spasms.   . traZODone (DESYREL) 100 MG tablet TAKE 1 TABLET AT BEDTIME   No facility-administered encounter medications on file as of 12/16/2019.    Activities of Daily Living In your present state of health, do you have any difficulty performing the following activities: 01/15/2019 01/15/2019  Hearing? - N  Vision? - N  Difficulty concentrating or making decisions? - N  Walking or climbing stairs? - Y  Dressing or bathing? - N  Doing errands, shopping? N -  Some recent data might be hidden    Patient Care Team: Hali Marry, MD as PCP - General (Family Medicine) Anne Shutter, MD as Physician Assistant (Rheumatology) Milinda Cave, FNP as Nurse Practitioner (Rheumatology)    Assessment:   This is a routine wellness examination for [place].Physical assessment deferred to PCP.   Exercise Activities and Dietary recommendations   Diet  Breakfast: Lunch:  Dinner:       Goals   None     Fall Risk Fall Risk  04/10/2019 07/13/2017 07/18/2016 07/11/2016 06/12/2015  Falls in the past year? 0 No No No Yes  Comment - - Emmi Telephone Survey: data to providers prior to load - -  Number falls in past yr: 0 - - - 1  Injury with Fall? 0 - - - Yes  Risk for fall due to : - - - - Other (Comment)  Follow up Falls prevention discussed - - - Falls prevention discussed   Is the  patient's home free of loose throw rugs in walkways, pet beds, electrical cords, etc?   {Blank single:19197::"yes","no"}      Grab bars in the bathroom? {Blank single:19197::"yes","no"}      Handrails on the stairs?   {Blank single:19197::"yes","no"}      Adequate lighting?   {Blank single:19197::"yes","no"}  Depression Screen PHQ 2/9 Scores 04/10/2019 06/04/2018 07/13/2017 07/11/2016  PHQ - 2 Score 0 0 0 0  PHQ- 9 Score - 1 - -     Cognitive Function        Immunization History  Administered Date(s) Administered  . Influenza Split 09/01/2011  . Influenza, High Dose Seasonal PF 07/13/2017, 10/10/2018  . Influenza,inj,Quad PF,6+ Mos 12/30/2014, 09/23/2015  . Influenza-Unspecified 09/01/2011, 12/30/2014, 09/23/2015, 08/18/2016  . Pneumococcal Conjugate-13 02/10/2015  . Pneumococcal Polysaccharide-23 06/22/2012  . Tdap 12/28/2017  . Zoster 05/26/2014    Screening Tests Health Maintenance  Topic Date Due  . INFLUENZA VACCINE  06/22/2019  . MAMMOGRAM  10/24/2020  . COLONOSCOPY  04/30/2022  . TETANUS/TDAP  12/29/2027  . Hepatitis C Screening  Completed  . PNA vac Low Risk Adult  Completed        Plan:   ***   I have personally reviewed and noted the following in the patient's chart:   . Medical and social history . Use of alcohol, tobacco or illicit drugs  . Current medications and supplements . Functional ability and status . Nutritional status . Physical activity . Advanced directives . List of other physicians . Hospitalizations, surgeries, and  ER visits in previous 12 months . Vitals . Screenings to include cognitive, depression, and falls . Referrals and appointments  In addition, I have reviewed and discussed with patient certain preventive protocols, quality metrics, and best practice recommendations. A written personalized care plan for preventive services as well as general preventive health recommendations were provided to patient.     Joanne Chars,  LPN  [id number]

## 2019-12-30 NOTE — Progress Notes (Signed)
Subjective:   Katherine Nixon is a 74 y.o. female who presents for Medicare Annual (Subsequent) preventive examination.  Review of Systems:  No ROS.  Medicare Wellness Virtual Visit.  Visual/audio telehealth visit, UTA vital signs.   See social history for additional risk factors.    Cardiac Risk Factors include: advanced age (>41men, >56 women);sedentary lifestyle;hypertension Sleep patterns: Getting 6 hours of sleep a night. Does not wake up to void. Does wake up in pain at times. Wakes up and feels rested. Home Safety/Smoke Alarms: Feels safe in home. Smoke alarms in place.  Living environment; Lives with husband in a 2 story home and stairs have hand rails on them.  Shower is a walk in shower and no grab bars in place. Seat Belt Safety/Bike Helmet: Wears seat belt.   Female:   Pap- Aged out      Mammo-  UTD      Dexa scan- ordered       CCS- UTD     Objective:     Vitals: BP 133/66   Pulse 62   Temp 98.1 F (36.7 C) (Oral)   Ht 5\' 5"  (1.651 m)   Wt 190 lb (86.2 kg)   SpO2 94%   BMI 31.62 kg/m   Body mass index is 31.62 kg/m.  Advanced Directives 01/08/2020 01/15/2019 01/07/2019 03/12/2018 03/12/2018 03/01/2018 09/01/2016  Does Patient Have a Medical Advance Directive? Yes Yes Yes Yes No Yes Yes  Type of Paramedic of [place];Living will Living will Living will [place];Living will - - [place];Living will  Does patient want to make changes to medical advance directive? No - Patient declined No - Patient declined No - Patient declined No - Patient declined - - No - Patient declined  Copy of [place] in Chart? Yes - validated most recent copy scanned in chart (See row information) - - Yes - - No - copy requested  Would patient like information on creating a medical advance directive? - - - - No - Patient declined - -    Tobacco Social History   Tobacco Use  Smoking Status Former  Smoker  . Packs/day: 1.00  . Years: 5.00  . Pack years: 5.00  . Types: Cigarettes  . Quit date: 90  . Years since quitting: 32.1  Smokeless Tobacco Never Used     Counseling given: No   Clinical Intake:  Pre-visit preparation completed: Yes  Pain : No/denies pain     Nutritional Risks: None Diabetes: No  How often do you need to have someone help you when you read instructions, pamphlets, or other written materials from your doctor or pharmacy?: 1 - Never What is the last grade level you completed in school?: 14  Interpreter Needed?: No  Information entered by :: Orlie Dakin, LPN  Past Medical History:  Diagnosis Date  . Anxiety   . Basal cell carcinoma 01/2018   "upper lip"  . Chronic diarrhea   . Complication of anesthesia 1989   diff to wake up with gallbaldder surgery- no problems since (03/12/2018)  . Connective tissue disorder ([place])    "lupus"  . Depression   . Dysrhythmia     states at times, last occurence was january 2019 feels like heart fluttering , last a couple seconds, denies any associated cardiac symtpoms. rpeorts at her last annual check up her pcp Metheney  heard it on pysical  exam and ordred an ECHO , per  patietn no one reached out to her to set it up   . Heart murmur   . High cholesterol   . History of blood transfusion 1970   "related to childbirth"  . Hypertension   . IBS (irritable bowel syndrome)   . Migraine    "none since ~ 1980" (03/12/2018)  . Neuromuscular disorder (HCC)    lupus  . Osteoarthritis    "all my joints" (03/12/2018)  . Squamous cell carcinoma    "right arm"   Past Surgical History:  Procedure Laterality Date  . BASAL CELL CARCINOMA EXCISION  01/2018   "upper lip"  . BREAST BIOPSY Left     neg  . BREAST CYST ASPIRATION Right   . CATARACT EXTRACTION W/ INTRAOCULAR LENS  IMPLANT, BILATERAL    . CHOLECYSTECTOMY OPEN  1989  . COLONOSCOPY    . DILATION AND CURETTAGE OF UTERUS    . JOINT REPLACEMENT    . KNEE  ARTHROSCOPY Bilateral   . NASAL SINUS SURGERY     "severe infection"  . SHOULDER ARTHROSCOPY Left 12/2014   Dr. Tamera Punt   . SQUAMOUS CELL CARCINOMA EXCISION Right    arm  . TOTAL HIP ARTHROPLASTY Right 08/31/2016   Procedure: TOTAL HIP ARTHROPLASTY ANTERIOR APPROACH;  Surgeon: Frederik Pear, MD;  Location: [facility];  Service: Orthopedics;  Laterality: Right;  . TOTAL KNEE ARTHROPLASTY Right 03/12/2018   Procedure: TOTAL KNEE ARTHROPLASTY;  Surgeon: Frederik Pear, MD;  Location: [facility];  Service: Orthopedics;  Laterality: Right;  . TOTAL KNEE ARTHROPLASTY    . TOTAL KNEE ARTHROPLASTY Left 01/15/2019   Procedure: TOTAL KNEE ARTHROPLASTY;  Surgeon: Melrose Nakayama, MD;  [facility];  Service: Orthopedics;  Laterality: Left;  Marland Kitchen VARICOSE VEIN SURGERY Bilateral    Family History  Problem Relation Age of Onset  . Heart disease Mother   . Heart attack Mother   . Hyperlipidemia Father   . Other Father        amputaiton  . Hyperlipidemia Brother   . Hypertension Brother    Social History   Socioeconomic History  . Marital status: Married    Spouse name: Jimmie  . Number of children: 4  . Years of education: 78  . Highest education level: Some college, no degree  Occupational History  . 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 Smoking status: Former Smoker    Packs/day: 1.00    Years: 5.00    Pack years: 5.00    Types: Cigarettes    Quit date: 1989    Years since quitting: 32.1  . Smokeless tobacco: Never Used  Substance and Sexual Activity  . Alcohol use: No  . Drug use: No  . Sexual activity: Not Currently    Birth control/protection: Abstinence  Other Topics Concern  . Not on file  Social History Narrative   Caffeine- 3-4 cups a day   Chores around the house   Does crafting   Baking and selling at [employer] Strain:   . Difficulty of Paying Living Expenses: Not on file   Food Insecurity:   . Worried About Charity fundraiser in the Last Year: Not on file  . Ran Out of Food in the Last Year: Not on file  Transportation Needs:   . Lack of Transportation (Medical): Not on file  . Lack of Transportation (Non-Medical): Not on file  Physical Activity:   .  Days of Exercise per Week: Not on file  . Minutes of Exercise per Session: Not on file  Stress:   . Feeling of Stress : Not on file  Social Connections:   . Frequency of Communication with Friends and Family: Not on file  . Frequency of Social Gatherings with Friends and Family: Not on file  . Attends Religious Services: Not on file  . Active Member of Clubs or Organizations: Not on file  . Attends Archivist Meetings: Not on file  . Marital Status: Not on file    Outpatient Encounter Medications as of 01/08/2020  Medication Sig  . amLODipine (NORVASC) 5 MG tablet TAKE 1 TABLET DAILY (Patient taking differently: Take 5 mg by mouth daily. )  . atorvastatin (LIPITOR) 20 MG tablet Take 1 tablet (20 mg total) by mouth daily at 6 PM.  . DULoxetine (CYMBALTA) 60 MG capsule TAKE 1 CAPSULE DAILY  . hydrochlorothiazide (HYDRODIURIL) 25 MG tablet TAKE 1 TABLET DAILY (Patient taking differently: Take 12.5 mg by mouth daily at 6 PM. )  . linaclotide (LINZESS) 72 MCG capsule Take 1 capsule (72 mcg total) by mouth daily. (Patient taking differently: Take 72 mcg by mouth daily at 6 PM. )  . Multiple Vitamin (MULTIVITAMIN WITH MINERALS) TABS tablet Take 1 tablet by mouth daily.   Marland Kitchen tiZANidine (ZANAFLEX) 2 MG tablet Take 2 mg by mouth 2 (two) times daily as needed for muscle spasms.   . traZODone (DESYREL) 100 MG tablet TAKE 1 TABLET AT BEDTIME   No facility-administered encounter medications on file as of 01/08/2020.    Activities of Daily Living In your present state of health, do you have any difficulty performing the following activities: 01/08/2020 01/15/2019  Hearing? N N  Vision? N N  Difficulty  concentrating or making decisions? Y N  Comment states does have some short term memory issues- is aware of it. -  Walking or climbing stairs? N Y  Dressing or bathing? N N  Doing errands, shopping? N N  Preparing Food and eating ? N -  Using the Toilet? N -  In the past six months, have you accidently leaked urine? Y -  Comment occasionally as she waits too long to go to bathroom -  Do you have problems with loss of bowel control? N -  Managing your Medications? N -  Managing your Finances? N -  Housekeeping or managing your Housekeeping? N -  Some recent data might be hidden    Patient Care Team: Hali Marry, MD as PCP - General (Family Medicine) Anne Shutter, MD as Physician Assistant (Rheumatology) Milinda Cave, FNP as Nurse Practitioner (Rheumatology)    Assessment:   This is a routine wellness examination for [place].Physical assessment deferred to PCP.   Exercise Activities and Dietary recommendations Current Exercise Habits: The patient does not participate in regular exercise at present, Exercise limited by: orthopedic condition(s);Other - see comments(chronic pain) Diet Eats a healthy diet of vegetables, fruits and proteins. Breakfast: Cereal with fruit, or bagel with peanut butter with banana or cream of wheat with fruit topper. Lunch: sometimes skips Dinner: Meat and vegetables and a starch. Dessert is cookie or pie Drinks 32 ounces of water daily      Goals    . Weight (lb) < 200 lb (90.7 kg)     Patient stated would like to loose at least 40 lbs this year.       Fall Risk Fall Risk  01/08/2020 04/10/2019 07/13/2017  07/18/2016 07/11/2016  Falls in the past year? 0 0 No No No  Comment - - - Emmi Telephone Survey: data to providers prior to load -  Number falls in past yr: - 0 - - -  Injury with Fall? - 0 - - -  Risk for fall due to : Impaired balance/gait - - - -  Follow up Falls prevention discussed Falls prevention discussed - - -   Is the  patient's home free of loose throw rugs in walkways, pet beds, electrical cords, etc?   yes      Grab bars in the bathroom? no      Handrails on the stairs?   yes      Adequate lighting?   yes   Depression Screen PHQ 2/9 Scores 01/08/2020 04/10/2019 06/04/2018 07/13/2017  PHQ - 2 Score 0 0 0 0  PHQ- 9 Score - - 1 -     Cognitive Function     6CIT Screen 01/08/2020  What Year? 0 points  What month? 0 points  What time? 0 points  Count back from 20 0 points  Months in reverse 0 points  Repeat phrase 0 points  Total Score 0    Immunization History  Administered Date(s) Administered  . Influenza Split 09/01/2011  . Influenza, High Dose Seasonal PF 07/13/2017, 10/10/2018  . Influenza,inj,Quad PF,6+ Mos 12/30/2014, 09/23/2015  . Influenza-Unspecified 09/01/2011, 12/30/2014, 09/23/2015, 08/18/2016  . Pneumococcal Conjugate-13 02/10/2015  . Pneumococcal Polysaccharide-23 06/22/2012  . Tdap 12/28/2017  . Zoster 05/26/2014    Screening Tests Health Maintenance  Topic Date Due  . INFLUENZA VACCINE  02/19/2020 (Originally 06/22/2019)  . MAMMOGRAM  10/24/2020  . COLONOSCOPY  04/30/2022  . TETANUS/TDAP  12/29/2027  . Hepatitis C Screening  Completed  . PNA vac Low Risk Adult  Completed      Plan:    Please schedule your next medicare wellness visit with me in 1 yr.  Katherine Nixon , Thank you for taking time to come for your Medicare Wellness Visit. I appreciate your ongoing commitment to your health goals. Please review the following plan we discussed and let me know if I can assist you in the future.  Continue doing brain stimulating activities (puzzles, reading, adult coloring books, staying active) to keep memory sharp.    These are the goals we discussed: Goals    . Weight (lb) < 200 lb (90.7 kg)     Patient stated would like to loose at least 40 lbs this year.       This is a list of the screening recommended for you and due dates:  Health Maintenance  Topic Date Due  .  Flu Shot  02/19/2020*  . Mammogram  10/24/2020  . Colon Cancer Screening  04/30/2022  . Tetanus Vaccine  12/29/2027  .  Hepatitis C: One time screening is recommended by Center for Disease Control  (CDC) for  adults born from [birth year] through 1965.   Completed  . Pneumonia vaccines  Completed  *Topic was postponed. The date shown is not the original due date.      These are the goals we discussed: Goals    . Weight (lb) < 200 lb (90.7 kg)     Patient stated would like to loose at least 40 lbs this year.       This is a list of the screening recommended for you and due dates:  Health Maintenance  Topic Date Due  . Flu Shot  02/19/2020*  .  Mammogram  10/24/2020  . Colon Cancer Screening  04/30/2022  . Tetanus Vaccine  12/29/2027  .  Hepatitis C: One time screening is recommended by Center for Disease Control  (CDC) for  adults born from [birth year] through 1965.   Completed  . Pneumonia vaccines  Completed  *Topic was postponed. The date shown is not the original due date.      I have personally reviewed and noted the following in the patient's chart:   . Medical and social history . Use of alcohol, tobacco or illicit drugs  . Current medications and supplements . Functional ability and status . Nutritional status . Physical activity . Advanced directives . List of other physicians . Hospitalizations, surgeries, and ER visits in previous 12 months . Vitals . Screenings to include cognitive, depression, and falls . Referrals and appointments  In addition, I have reviewed and discussed with patient certain preventive protocols, quality metrics, and best practice recommendations. A written personalized care plan for preventive services as well as general preventive health recommendations were provided to patient.     Joanne Chars, LPN  [id number]

## 2020-01-08 ENCOUNTER — Ambulatory Visit (INDEPENDENT_AMBULATORY_CARE_PROVIDER_SITE_OTHER): Payer: Medicare Other | Admitting: *Deleted

## 2020-01-08 VITALS — BP 133/66 | HR 62 | Temp 98.1°F | Ht 65.0 in | Wt 190.0 lb

## 2020-01-08 DIAGNOSIS — Z780 Asymptomatic menopausal state: Secondary | ICD-10-CM

## 2020-01-08 DIAGNOSIS — Z0000 Encounter for general adult medical examination without abnormal findings: Secondary | ICD-10-CM

## 2020-01-08 DIAGNOSIS — Z13820 Encounter for screening for osteoporosis: Secondary | ICD-10-CM

## 2020-01-08 NOTE — Patient Instructions (Addendum)
Please schedule your next medicare wellness visit with me in 1 yr.  Katherine Nixon , Thank you for taking time to come for your Medicare Wellness Visit. I appreciate your ongoing commitment to your health goals. Please review the following plan we discussed and let me know if I can assist you in the future.  Continue doing brain stimulating activities (puzzles, reading, adult coloring books, staying active) to keep memory sharp.  These are the goals we discussed: Goals    . Weight (lb) < 200 lb (90.7 kg)     Patient stated would like to loose at least 40 lbs this year.      If you continue to have the lower left sided rib pain please call the office back and schedule an appointment with Dr. Madilyn Fireman. Does not wish to get COVID vaccine due to her auto immune disease. Bone density test ordered and they will call you to get this scheduled.

## 2020-01-11 ENCOUNTER — Other Ambulatory Visit: Payer: Self-pay | Admitting: Family Medicine

## 2020-01-15 ENCOUNTER — Other Ambulatory Visit: Payer: Self-pay | Admitting: Family Medicine

## 2020-01-15 ENCOUNTER — Ambulatory Visit (INDEPENDENT_AMBULATORY_CARE_PROVIDER_SITE_OTHER): Payer: Medicare Other

## 2020-01-15 ENCOUNTER — Other Ambulatory Visit: Payer: Self-pay

## 2020-01-15 DIAGNOSIS — Z13820 Encounter for screening for osteoporosis: Secondary | ICD-10-CM

## 2020-01-15 DIAGNOSIS — Z1231 Encounter for screening mammogram for malignant neoplasm of breast: Secondary | ICD-10-CM | POA: Diagnosis not present

## 2020-01-15 DIAGNOSIS — Z780 Asymptomatic menopausal state: Secondary | ICD-10-CM | POA: Diagnosis not present

## 2020-01-15 DIAGNOSIS — M85852 Other specified disorders of bone density and structure, left thigh: Secondary | ICD-10-CM | POA: Diagnosis not present

## 2020-01-26 ENCOUNTER — Other Ambulatory Visit: Payer: Self-pay | Admitting: Family Medicine

## 2020-02-11 ENCOUNTER — Ambulatory Visit (INDEPENDENT_AMBULATORY_CARE_PROVIDER_SITE_OTHER): Payer: Medicare Other | Admitting: Family Medicine

## 2020-02-11 ENCOUNTER — Encounter: Payer: Self-pay | Admitting: Family Medicine

## 2020-02-11 ENCOUNTER — Other Ambulatory Visit: Payer: Self-pay

## 2020-02-11 VITALS — BP 128/48 | HR 63 | Ht 65.0 in | Wt 187.0 lb

## 2020-02-11 DIAGNOSIS — G4700 Insomnia, unspecified: Secondary | ICD-10-CM | POA: Diagnosis not present

## 2020-02-11 DIAGNOSIS — E7849 Other hyperlipidemia: Secondary | ICD-10-CM | POA: Diagnosis not present

## 2020-02-11 DIAGNOSIS — R10A2 Flank pain, left side: Secondary | ICD-10-CM | POA: Insufficient documentation

## 2020-02-11 DIAGNOSIS — I10 Essential (primary) hypertension: Secondary | ICD-10-CM

## 2020-02-11 DIAGNOSIS — R2689 Other abnormalities of gait and mobility: Secondary | ICD-10-CM

## 2020-02-11 DIAGNOSIS — R109 Unspecified abdominal pain: Secondary | ICD-10-CM | POA: Diagnosis not present

## 2020-02-11 LAB — COMPLETE METABOLIC PANEL WITH GFR
AG Ratio: 1.4 (calc) (ref 1.0–2.5)
ALT: 31 U/L — ABNORMAL HIGH (ref 6–29)
AST: 39 U/L — ABNORMAL HIGH (ref 10–35)
Albumin: 4.3 g/dL (ref 3.6–5.1)
Alkaline phosphatase (APISO): 101 U/L (ref 37–153)
BUN/Creatinine Ratio: 16 (calc) (ref 6–22)
BUN: 17 mg/dL (ref 7–25)
CO2: 29 mmol/L (ref 20–32)
Calcium: 10.4 mg/dL (ref 8.6–10.4)
Chloride: 105 mmol/L (ref 98–110)
Creat: 1.05 mg/dL — ABNORMAL HIGH (ref 0.60–0.93)
GFR, Est African American: 61 mL/min/{1.73_m2} (ref >=60)
GFR, Est Non African American: 53 mL/min/{1.73_m2} — ABNORMAL LOW (ref >=60)
Globulin: 3.1 g/dL (calc) (ref 1.9–3.7)
Glucose, Bld: 96 mg/dL (ref 65–99)
Potassium: 4.9 mmol/L (ref 3.5–5.3)
Sodium: 141 mmol/L (ref 135–146)
Total Bilirubin: 0.5 mg/dL (ref 0.2–1.2)
Total Protein: 7.4 g/dL (ref 6.1–8.1)

## 2020-02-11 LAB — CBC WITH DIFFERENTIAL/PLATELET
Absolute Monocytes: 617 {cells}/uL (ref 200–950)
Basophils Absolute: 32 {cells}/uL (ref 0–200)
Basophils Relative: 0.5 %
Eosinophils Absolute: 139 {cells}/uL (ref 15–500)
Eosinophils Relative: 2.2 %
HCT: 38.1 % (ref 35.0–45.0)
Hemoglobin: 12.5 g/dL (ref 11.7–15.5)
Lymphs Abs: 1468 {cells}/uL (ref 850–3900)
MCH: 30.7 pg (ref 27.0–33.0)
MCHC: 32.8 g/dL (ref 32.0–36.0)
MCV: 93.6 fL (ref 80.0–100.0)
MPV: 9.7 fL (ref 7.5–12.5)
Monocytes Relative: 9.8 %
Neutro Abs: 4045 {cells}/uL (ref 1500–7800)
Neutrophils Relative %: 64.2 %
Platelets: 204 Thousand/uL (ref 140–400)
RBC: 4.07 Million/uL (ref 3.80–5.10)
RDW: 13.3 % (ref 11.0–15.0)
Total Lymphocyte: 23.3 %
WBC: 6.3 Thousand/uL (ref 3.8–10.8)

## 2020-02-11 LAB — LIPID PANEL
Cholesterol: 166 mg/dL (ref <200)
HDL: 65 mg/dL (ref >=50)
LDL Cholesterol (Calc): 85 mg/dL (calc)
Non-HDL Cholesterol (Calc): 101 mg/dL (calc) (ref <130)
Total CHOL/HDL Ratio: 2.6 (calc) (ref <5.0)
Triglycerides: 70 mg/dL (ref <150)

## 2020-02-11 NOTE — Progress Notes (Signed)
Established Patient Office Visit  Subjective:  Patient ID: Katherine Nixon, female    DOB: 10/16/1946  Age: 74 y.o. MRN: [id number]  CC:  Chief Complaint  Patient presents with  . Hypertension    HPI Katherine Nixon presents for   Hypertension- Pt denies chest pain, SOB, dizziness, or heart palpitations.  Taking meds as directed w/o problems.  Denies medication side effects.  Reports home blood pressures have been well controlled.  She is also still having a lot of left flank pain.  It mostly just bothers her at night it makes her sleep very restless and sometimes even wakes her up.  She will have to move and get into just the right position for her discomfort to be alleviated.  She does take Aleve at bedtime.  She also reports that her sleep cycle is completely off.  She does not fall asleep until about 2 AM and then wakes up at 7 and then after getting up and eating breakfast and taking her medication she usually goes back to bed because she feels achy.  When she feels better she will finally get up and then she ends up taking in a most 2-hour nap around 8 PM.  He also feels that her balance is off.  She has not had any recent falls and she denies any actual vertigo symptoms though she does report that when she lies in bed on her right side and then turns onto her back she does get some brief room spinning but this is not new.  She denies any recent vision or hearing changes.  She denies any numbness or tingling in the extremities or known history of neuropathy.     Past Medical History:  Diagnosis Date  . Anxiety   . Basal cell carcinoma 01/2018   "upper lip"  . Chronic diarrhea   . Complication of anesthesia 1989   diff to wake up with gallbaldder surgery- no problems since (03/12/2018)  . Connective tissue disorder ([facility])    "lupus"  . Depression   . Dysrhythmia     states at times, last occurence was january 2019 feels like heart fluttering , last a couple seconds,  denies any associated cardiac symtpoms. rpeorts at her last annual check up her pcp Darrien Belter  heard it on pysical  exam and ordred an ECHO , per patietn no one reached out to her to set it up   . Heart murmur   . High cholesterol   . History of blood transfusion 1970   "related to childbirth"  . Hypertension   . IBS (irritable bowel syndrome)   . Migraine    "none since ~ 1980" (03/12/2018)  . Neuromuscular disorder ([facility])    lupus  . Osteoarthritis    "all my joints" (03/12/2018)  . Squamous cell carcinoma    "right arm"    Past Surgical History:  Procedure Laterality Date  . BASAL CELL CARCINOMA EXCISION  01/2018   "upper lip"  . BREAST BIOPSY Left     neg  . BREAST CYST ASPIRATION Right   . CATARACT EXTRACTION W/ INTRAOCULAR LENS  IMPLANT, BILATERAL    . CHOLECYSTECTOMY OPEN  1989  . COLONOSCOPY    . DILATION AND CURETTAGE OF UTERUS    . JOINT REPLACEMENT    . KNEE ARTHROSCOPY Bilateral   . NASAL SINUS SURGERY     "severe infection"  . SHOULDER ARTHROSCOPY Left 12/2014   Dr. Tamera Punt   . SQUAMOUS CELL CARCINOMA  EXCISION Right    arm  . TOTAL HIP ARTHROPLASTY Right 08/31/2016   Procedure: TOTAL HIP ARTHROPLASTY ANTERIOR APPROACH;  Surgeon: Frederik Pear, MD;  Location: [facility];  Service: Orthopedics;  Laterality: Right;  . TOTAL KNEE ARTHROPLASTY Right 03/12/2018   Procedure: TOTAL KNEE ARTHROPLASTY;  Surgeon: Frederik Pear, MD;  Location: [facility];  Service: Orthopedics;  Laterality: Right;  . TOTAL KNEE ARTHROPLASTY    . TOTAL KNEE ARTHROPLASTY Left 01/15/2019   Procedure: TOTAL KNEE ARTHROPLASTY;  Surgeon: Melrose Nakayama, MD;  Location: [facility];  Service: Orthopedics;  Laterality: Left;  Marland Kitchen VARICOSE VEIN SURGERY Bilateral     Family History  Problem Relation Age of Onset  . Heart disease Mother   . Heart attack Mother   . Hyperlipidemia Father   . Other Father        amputaiton  . Hyperlipidemia Brother   . Hypertension Brother     Social History   Socioeconomic  History  . Marital status: Married    Spouse name: Jimmie  . Number of children: 4  . Years of education: 68  . Highest education level: Some college, no degree  Occupational History  . 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 Smoking status: Former Smoker    Packs/day: 1.00    Years: 5.00    Pack years: 5.00    Types: Cigarettes    Quit date: 1989    Years since quitting: 32.2  . Smokeless tobacco: Never Used  Substance and Sexual Activity  . Alcohol use: No  . Drug use: No  . Sexual activity: Not Currently    Birth control/protection: Abstinence  Other Topics Concern  . Not on file  Social History Narrative   Caffeine- 3-4 cups a day   Chores around the house   Does crafting   Baking and selling at [employer] Strain:   . Difficulty of Paying Living Expenses:   Food Insecurity:   . Worried About Charity fundraiser in the Last Year:   . Arboriculturist in the Last Year:   Transportation Needs:   . Film/video editor (Medical):   Marland Kitchen Lack of Transportation (Non-Medical):   Physical Activity:   . Days of Exercise per Week:   . Minutes of Exercise per Session:   Stress:   . Feeling of Stress :   Social Connections:   . Frequency of Communication with Friends and Family:   . Frequency of Social Gatherings with Friends and Family:   . Attends Religious Services:   . Active Member of Clubs or Organizations:   . Attends Archivist Meetings:   Marland Kitchen Marital Status:   Intimate Partner Violence:   . Fear of Current or Ex-Partner:   . Emotionally Abused:   Marland Kitchen Physically Abused:   . Sexually Abused:     Outpatient Medications Prior to Visit  Medication Sig Dispense Refill  . amLODipine (NORVASC) 5 MG tablet TAKE 1 TABLET DAILY 90 tablet 0  . atorvastatin (LIPITOR) 20 MG tablet Take 1 tablet (20 mg total) by mouth daily at 6 PM. 90 tablet 3  . DULoxetine (CYMBALTA)  60 MG capsule TAKE 1 CAPSULE DAILY 90 capsule 3  . hydrochlorothiazide (HYDRODIURIL) 25 MG tablet TAKE 1 TABLET DAILY (Patient taking differently: Take 12.5 mg by mouth daily at 6 PM. ) 90 tablet 4  . linaclotide (LINZESS) 72  MCG capsule Take 1 capsule (72 mcg total) by mouth daily. (Patient taking differently: Take 72 mcg by mouth daily at 6 PM. ) 90 capsule 3  . Multiple Vitamin (MULTIVITAMIN WITH MINERALS) TABS tablet Take 1 tablet by mouth daily.     Marland Kitchen tiZANidine (ZANAFLEX) 2 MG tablet Take 2 mg by mouth 2 (two) times daily as needed for muscle spasms.     . traZODone (DESYREL) 100 MG tablet TAKE 1 TABLET AT BEDTIME 90 tablet 0   No facility-administered medications prior to visit.    Allergies  Allergen Reactions  . Gabapentin Other (See Comments)    Hallucinations   . Tramadol Other (See Comments)    Hallucinations   . Tylenol [Acetaminophen] Other (See Comments)    Due to liver enzyme issue  . Pravastatin Other (See Comments)    Muscle cramping  . Sulfamethoxazole-Trimethoprim Other (See Comments)    Thrush in mouth    ROS Review of Systems    Objective:    Physical Exam  Constitutional: She is oriented to person, place, and time. She appears well-developed and well-nourished.  HENT:  Head: Normocephalic and atraumatic.  Eyes: Conjunctivae are normal.  Cardiovascular: Normal rate, regular rhythm and normal heart sounds.  Pulmonary/Chest: Effort normal and breath sounds normal.  Neurological: She is alert and oriented to person, place, and time.  Skin: Skin is warm and dry.  Psychiatric: She has a normal mood and affect. Her behavior is normal.    BP (!) 128/48   Pulse 63   Ht 5\' 5"  (1.651 m)   Wt 187 lb (84.8 kg)   SpO2 95%   BMI 31.12 kg/m  Wt Readings from Last 3 Encounters:  02/11/20 187 lb (84.8 kg)  01/08/20 190 lb (86.2 kg)  04/10/19 183 lb (83 kg)     There are no preventive care reminders to display for this patient.  There are no preventive  care reminders to display for this patient.  Lab Results  Component Value Date   TSH 0.89 07/13/2017   Lab Results  Component Value Date   WBC 5.3 04/12/2019   HGB 11.9 04/12/2019   HCT 37.9 04/12/2019   MCV 92.7 04/12/2019   PLT 215 04/12/2019   Lab Results  Component Value Date   NA 142 04/12/2019   K 4.1 04/12/2019   CO2 26 04/12/2019   GLUCOSE 91 04/12/2019   BUN 20 04/12/2019   CREATININE 0.89 04/12/2019   BILITOT 0.3 04/12/2019   ALKPHOS 137 (A) 10/24/2016   AST 30 04/12/2019   ALT 15 04/12/2019   PROT 6.9 04/12/2019   ALBUMIN 3.9 07/11/2016   CALCIUM 10.2 04/12/2019   ANIONGAP 8 01/15/2019   Lab Results  Component Value Date   CHOL 169 04/12/2019   Lab Results  Component Value Date   HDL 75 04/12/2019   Lab Results  Component Value Date   LDLCALC 84 04/12/2019   Lab Results  Component Value Date   TRIG 36 04/12/2019   Lab Results  Component Value Date   CHOLHDL 2.3 04/12/2019   Lab Results  Component Value Date   HGBA1C 5.6 03/12/2018      Assessment & Plan:   Problem List Items Addressed This Visit      Cardiovascular and Mediastinum   Essential hypertension, benign - Primary    Well controlled. Continue current regimen. Follow up in  6 months.       Relevant Orders   COMPLETE METABOLIC PANEL WITH GFR  Lipid panel   CBC with Differential/Platelet     Other   Left flank pain    Left leg pain-unclear etiology. We discussed that if it persisted we would consider imaging in the future. It still sounds like it could be somewhat positional because it only happens at night. But she says she denies any other problems during the day with her back such as with bending lifting twisting etc. So like to move forward with CT just to check out her kidney as well as bowel and make sure that everything looks okay. She is okay with this plan.      Relevant Orders   COMPLETE METABOLIC PANEL WITH GFR   Lipid panel   CBC with Differential/Platelet    CT Abdomen Pelvis W Contrast   Insomnia    Strategies around sleep improvement including moving her bedtime forward by 15-minute increments every 10 to 14 days and trying to eliminate the nap that she is taking in the early evening which I think is definitely impacting her ability to fall asleep at a normal time.      Hyperlipemia   Relevant Orders   COMPLETE METABOLIC PANEL WITH GFR   Lipid panel   CBC with Differential/Platelet   Balance problem   Relevant Orders   Ambulatory referral to Physical Therapy     Balance problems-it doesn't sound like true vertigo or lightheadedness. She says she'll just be walking and all of a sudden start to go one way or the other. I think formal physical therapy could be very helpful for this will so we'll go ahead and place referral.  No orders of the defined types were placed in this encounter.   Follow-up: Return in about 3 weeks (around 03/03/2020) for balance and  flankl pain.  Time spent 35 minutes in encounter.  Beatrice Lecher, MD

## 2020-02-11 NOTE — Assessment & Plan Note (Signed)
Left leg pain-unclear etiology. We discussed that if it persisted we would consider imaging in the future. It still sounds like it could be somewhat positional because it only happens at night. But she says she denies any other problems during the day with her back such as with bending lifting twisting etc. So like to move forward with CT just to check out her kidney as well as bowel and make sure that everything looks okay. She is okay with this plan.

## 2020-02-11 NOTE — Assessment & Plan Note (Signed)
Well controlled. Continue current regimen. Follow up in  6 months.  

## 2020-02-11 NOTE — Assessment & Plan Note (Signed)
Strategies around sleep improvement including moving her bedtime forward by 15-minute increments every 10 to 14 days and trying to eliminate the nap that she is taking in the early evening which I think is definitely impacting her ability to fall asleep at a normal time.

## 2020-02-17 ENCOUNTER — Ambulatory Visit (INDEPENDENT_AMBULATORY_CARE_PROVIDER_SITE_OTHER): Payer: Medicare Other

## 2020-02-17 ENCOUNTER — Other Ambulatory Visit: Payer: Self-pay

## 2020-02-17 DIAGNOSIS — R109 Unspecified abdominal pain: Secondary | ICD-10-CM | POA: Diagnosis not present

## 2020-02-17 DIAGNOSIS — N281 Cyst of kidney, acquired: Secondary | ICD-10-CM | POA: Diagnosis not present

## 2020-02-17 MED ORDER — IOHEXOL 300 MG/ML  SOLN
100.0000 mL | Freq: Once | INTRAMUSCULAR | Status: AC | PRN
  Administered 2020-02-17: 100 mL via INTRAVENOUS

## 2020-02-24 ENCOUNTER — Other Ambulatory Visit: Payer: Self-pay

## 2020-02-24 ENCOUNTER — Ambulatory Visit (INDEPENDENT_AMBULATORY_CARE_PROVIDER_SITE_OTHER): Payer: Medicare Other | Admitting: Rehabilitative and Restorative Service Providers"

## 2020-02-24 DIAGNOSIS — R29898 Other symptoms and signs involving the musculoskeletal system: Secondary | ICD-10-CM

## 2020-02-24 DIAGNOSIS — R2689 Other abnormalities of gait and mobility: Secondary | ICD-10-CM | POA: Diagnosis not present

## 2020-02-24 DIAGNOSIS — M6281 Muscle weakness (generalized): Secondary | ICD-10-CM | POA: Diagnosis not present

## 2020-02-24 NOTE — Therapy (Signed)
[facility] [place], [place], [address] Phone: [phone]   Fax:  [phone]  Physical Therapy Evaluation  Patient Details  Name: Katherine Nixon MRN: [id number] Date of Birth: 07/14/1946 Referring Provider (PT): Beatrice Lecher, MD   Encounter Date: 02/24/2020  PT End of Session - 02/24/20 1144    Visit Number  1    Number of Visits  12    Date for PT Re-Evaluation  04/06/20    PT Start Time  1105    PT Stop Time  1148    PT Time Calculation (min)  43 min       Past Medical History:  Diagnosis Date  . Anxiety   . Basal cell carcinoma 01/2018   "upper lip"  . Chronic diarrhea   . Complication of anesthesia 1989   diff to wake up with gallbaldder surgery- no problems since (03/12/2018)  . Connective tissue disorder ([place])    "lupus"  . Depression   . Dysrhythmia     states at times, last occurence was january 2019 feels like heart fluttering , last a couple seconds, denies any associated cardiac symtpoms. rpeorts at her last annual check up her pcp Methe[facility]  heard it on pysical  exam and ordred an ECHO , per patietn no one reached out to her to set it up   . Heart murmur   . High cholesterol   . History of blood transfusion 1970   "related to childbirth"  . Hypertension   . IBS (irritable bowel syndrome)   . Migraine    "none since ~ 1980" (03/12/2018)  . Neuromuscular disorder (HCC)    lupus  . Osteoarthritis    "all my joints" (03/12/2018)  . Squamous cell carcinoma    "right arm"    Past Surgical History:  Procedure Laterality Date  . BASAL CELL CARCINOMA EXCISION  01/2018   "upper lip"  . BREAST BIOPSY Left     neg  . BREAST CYST ASPIRATION Right   . CATARACT EXTRACTION W/ INTRAOCULAR LENS  IMPLANT, BILATERAL    . CHOLECYSTECTOMY OPEN  1989  . COLONOSCOPY    . DILATION AND CURETTAGE OF UTERUS    . JOINT REPLACEMENT    . KNEE ARTHROSCOPY Bilateral   . NASAL SINUS SURGERY      "severe infection"  . SHOULDER ARTHROSCOPY Left 12/2014   Dr. Tamera Punt   . SQUAMOUS CELL CARCINOMA EXCISION Right    arm  . TOTAL HIP ARTHROPLASTY Right 08/31/2016   Procedure: TOTAL HIP ARTHROPLASTY ANTERIOR APPROACH;  Surgeon: Frederik Pear, MD;  Location: [facility];  Service: Orthopedics;  Laterality: Right;  . TOTAL KNEE ARTHROPLASTY Right 03/12/2018   Procedure: TOTAL KNEE ARTHROPLASTY;  Surgeon: Frederik Pear, MD;  Location: [facility];  Service: Orthopedics;  Laterality: Right;  . TOTAL KNEE ARTHROPLASTY    . TOTAL KNEE ARTHROPLASTY Left 01/15/2019   Procedure: TOTAL KNEE ARTHROPLASTY;  Surgeon: Melrose Nakayama, MD;  Location: [facility];  Service: Orthopedics;  Laterality: Left;  Marland Kitchen VARICOSE VEIN SURGERY Bilateral     There were no vitals filed for this visit.   Subjective Assessment - 02/24/20 1104    Subjective  The patient reports declining balance since her joing replacements (R THR, bilat TKA).  She feels like she can start heading in one direction and her body goes in another.  She notes sensation she could fall with quick turns or if she has to sidestep obstacles.  H/o vertigo with  exercises she can use to treat it.    Pertinent History  L knee TKA 2020, R knee TKA 2019, R THA 2018, h/o shoulder surgery L side,  notes leg length discrepancy (L shorter), h/o low back pain, hypertension, lupus    Patient Stated Goals  improve balance    Currently in Pain?  No/denies         [facility] PT Assessment - 02/24/20 1115      Assessment   Medical Diagnosis  Imbalance    Referring Provider (PT)  Beatrice Lecher, MD    Onset Date/Surgical Date  02/11/20    Hand Dominance  Right    Prior Therapy  prior therapy for musculoskeletal issues      Precautions   Precautions  Fall      Restrictions   Weight Bearing Restrictions  No      Balance Screen   Has the patient fallen in the past 6 months  No   loses balance, but self recovers   Has the patient had a decrease in activity level because of a  fear of falling?   No    Is the patient reluctant to leave their home because of a fear of falling?   No      Home Environment   Living Environment  Private residence    Living Arrangements  Spouse/significant other    Type of [place] to enter    Entrance Stairs-Number of Steps  4    Entrance Stairs-Rails  Right    [place]  One level    Additional Comments  toes catch and she can trip on step or when walking      Prior Function   Level of Independence  Independent      Sensation   Light Touch  Impaired by gross assessment   sensation toes fall asleep     ROM / Strength   AROM / PROM / Strength  AROM;Strength      AROM   Overall AROM   Deficits    Overall AROM Comments  Limited bilat shoulder AROM to 130 degrees bilat, unable to reach behind her head.   Bilateral ankles limited in dorsiflexion per observation.      Strength   Overall Strength  Deficits    Strength Assessment Site  Hip;Knee;Ankle    Right/Left Hip  Right;Left    Right Hip Flexion  3+/5    Left Hip Flexion  4/5    Right/Left Knee  Right;Left    Right Knee Flexion  5/5    Right Knee Extension  5/5    Left Knee Flexion  5/5    Left Knee Extension  5/5    Right/Left Ankle  Right;Left    Right Ankle Dorsiflexion  4/5    Left Ankle Dorsiflexion  3/5      Transfers   Transfers  Sit to Stand;Stand to Sit    Sit to Stand  7: Independent    Five time sit to stand comments   20.83 seconds    Stand to Sit  7: Independent      Ambulation/Gait   Ambulation/Gait  Yes    Ambulation/Gait Assistance  6: Modified independent (Device/Increase time)    Ambulation Distance (Feet)  150 Feet    Assistive device  None    Gait Pattern  Decreased stride length;Trendelenburg    Ambulation Surface  Level;Indoor    Gait velocity  2.23 ft/sec  Stairs  Yes    Stairs Assistance  6: Modified independent (Device/Increase time)    Stair Management Technique  One rail Right;Alternating pattern     Number of Stairs  4      Standardized Balance Assessment   Standardized Balance Assessment  Berg Balance Test      Berg Balance Test   Sit to Stand  Able to stand without using hands and stabilize independently    Standing Unsupported  Able to stand safely 2 minutes    Sitting with Back Unsupported but Feet Supported on Floor or Stool  Able to sit safely and securely 2 minutes    Stand to Sit  Sits safely with minimal use of hands    Transfers  Able to transfer safely, minor use of hands    Standing Unsupported with Eyes Closed  Able to stand 10 seconds safely    Standing Unsupported with Feet Together  Able to place feet together independently and stand 1 minute safely    From Standing, Reach Forward with Outstretched Arm  Can reach confidently >25 cm (10")    From Standing Position, Pick up Object from Floor  Able to pick up shoe safely and easily    From Standing Position, Turn to Look Behind Over each Shoulder  Turn sideways only but maintains balance    Turn 360 Degrees  Able to turn 360 degrees safely in 4 seconds or less    Standing Unsupported, Alternately Place Feet on Step/Stool  Able to stand independently and safely and complete 8 steps in 20 seconds    Standing Unsupported, One Foot in [employer] balance while stepping or standing    Standing on One Leg  Tries to lift leg/unable to hold 3 seconds but remains standing independently    Total Score  47    Berg comment:  47/56           Vestibular Assessment - 02/24/20 1121      Vestibular Assessment   General Observation  Notes occasional room spinning when rolling R sidelying to supine.      Symptom Behavior   Subjective history of current problem  h/o intermittent vertigo    Type of Dizziness   Spinning;Imbalance    Duration of Dizziness  seconds    Symptom Nature  Positional    Aggravating Factors  --   R sidelying to sitting   Relieving Factors  Head stationary    Progression of Symptoms  --   varies    History of similar episodes  h/o intermittent vertigo      Oculomotor Exam   Oculomotor Alignment  Normal    Spontaneous  Absent    Gaze-induced   Absent    Smooth Pursuits  Intact    Saccades  Intact      Vestibulo-Ocular Reflex   VOR 1 Head Only (x 1 viewing)  x 10 reps slow pace without dizziness    Comment  notes neck crepitus      Positional Testing   Sidelying Test  Sidelying Right;Sidelying Left    Horizontal Canal Testing  Horizontal Canal Right;Horizontal Canal Left      Sidelying Right   Sidelying Right Duration  none    Sidelying Right Symptoms  No nystagmus      Sidelying Left   Sidelying Left Duration  none    Sidelying Left Symptoms  No nystagmus      Horizontal Canal Right   Horizontal Canal Right Duration  none    Horizontal Canal Right Symptoms  Normal      Horizontal Canal Left   Horizontal Canal Left Duration  none    Horizontal Canal Left Symptoms  Normal          Objective measurements completed on examination: See above findings.      [facility] Adult PT Treatment/Exercise - 02/24/20 1115      Neuro Re-ed    Neuro Re-ed Details   Standing in corner with narrow base of support + eyes closed,      Exercises   Exercises  Ankle      Ankle Exercises: Stretches   Gastroc Stretch  2 reps;20 seconds      Ankle Exercises: Standing   Heel Raises  Both;10 reps    Toe Raise  10 reps             PT Education - 02/24/20 1302    Education Details  HEP    Person(s) Educated  Patient    Methods  Explanation;Demonstration;Handout    Comprehension  Returned demonstration;Verbalized understanding          PT Long Term Goals - 02/24/20 1305      PT LONG TERM GOAL #1   Title  The patient will be indep with HEP for LE strengthening, posture, high level balance.    Time  6    Period  Weeks    Target Date  04/06/20      PT LONG TERM GOAL #2   Title  The patient will improve 5 time sit<>stand from 20.83 seconds to < or equal to 16 seconds.     Time  6    Period  Weeks    Target Date  04/06/20      PT LONG TERM GOAL #3   Title  The patient will improve Berg balance score from 47/56 to > or equal to 52/56.    Time  6    Period  Weeks    Target Date  04/06/20      PT LONG TERM GOAL #4   Title  The patient will improve gait speed from 2.23 ft/sec to > or equal to 2.8 ft/sec indicating improved community gait.    Time  6    Period  Weeks    Target Date  04/06/20      PT LONG TERM GOAL #5   Title  The patient will be further assessed for BPPV as indicated and treatment to follow.    Time  6    Period  Weeks    Target Date  04/06/20             Plan - 02/24/20 1308    Clinical Impression Statement  The patient is a 74 yo female presenting to OP physical therapy with imbalance and intermittent episodes of veering or having to sidestep to correct for imbalance in standing.  She presents with impairments in bilateral hip strength, L ankle DF strength, R ankle DF strength, posture, reported sensory changes feet, h/o total joints reducing proprioceptive feedback and intermittent vertigo (no positional vertigo identified today).  PT to address deficits to optimize functional mobility and improve confidence with daily activities.    Personal Factors and Comorbidities  Comorbidity 1;Comorbidity 2;Comorbidity 3+    Comorbidities  lupus, h/o low back pain, h/o multiple total joint replacements    Examination-Activity Limitations  Locomotion Level    Examination-Participation Restrictions  Community Activity    Stability/Clinical Decision Making  Stable/Uncomplicated    Clinical Decision Making  Low    Rehab Potential  Good    PT Frequency  2x / week    PT Duration  6 weeks    PT Treatment/Interventions  ADLs/Self Care Home Management;Gait training;Stair training;Functional mobility training;Therapeutic activities;Therapeutic exercise;Balance training;Neuromuscular re-education;Patient/family education;Canalith  Repostioning;Vestibular    PT Next Visit Plan  Progress HEP, ankle strengthening, hip strengthening, postural stretching (upper thoracic and c-spine), postural stabilization, dynamic balance, dynamic gait    PT Home Exercise Plan  Access Code: [email or web address]    Consulted and Agree with Plan of Care  Patient       Patient will benefit from skilled therapeutic intervention in order to improve the following deficits and impairments:  Abnormal gait, Decreased activity tolerance, Decreased balance, Decreased strength, Decreased mobility, Postural dysfunction, Impaired sensation, Impaired flexibility, Dizziness  Visit Diagnosis: Other abnormalities of gait and mobility  Muscle weakness (generalized)  Other symptoms and signs involving the musculoskeletal system     Problem List Patient Active Problem List   Diagnosis Date Noted  . Left flank pain 02/11/2020  . Primary osteoarthritis of left knee 01/15/2019  . 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  . Balance problem 10/11/2018  . Heart murmur 10/11/2018  . Pain of right sacroiliac joint 04/04/2018  . Primary osteoarthritis of right knee 03/12/2018  . Anxiety state 03/12/2018  . Chest pain syndrome 03/12/2018  . Degenerative arthritis of left knee 03/08/2018  . Irritable bowel syndrome with both constipation and diarrhea 12/26/2017  . Primary localized osteoarthritis of right hip 08/31/2016  . Arthritis of right hip 08/31/2016  . Constipation, chronic 03/10/2016  . Elevated alkaline phosphatase level 11/24/2015  . Primary osteoarthritis of both first carpometacarpal joints 09/22/2015  . Right shoulder pain 02/23/2015  . Chronic pain syndrome 02/06/2015  . Bilateral carpal tunnel syndrome 01/19/2015  . Dyssomnia 06/15/2014  . Left shoulder pain 06/15/2014  . Dry mouth 06/15/2014  . DDD (degenerative disc disease), lumbar 06/15/2014  . Insomnia 04/30/2014  . Actinic porokeratosis 02/27/2014  . ANA positive 01/24/2014  . History of  arthroscopy of left shoulder 01/17/2014  . Strain of gastrocnemius tendon of right lower extremity 12/16/2013  . Osteoarthritis of both knees 11/11/2013  . Lumbar degenerative disc disease 11/11/2013  . Osteoarthritis, hip, bilateral 10/14/2013  . Lupus (systemic lupus erythematosus) ([place]) 05/29/2013  . Chronic venous insufficiency 12/14/2012  . Hyperlipemia 06/02/2011  . Atrophic vaginitis 06/01/2011  . Essential hypertension, benign 05/09/2011  . GERD (gastroesophageal reflux disease) 05/09/2011  . Varicose veins 05/09/2011  . Urinary incontinence 03/25/2011    Priscille Shadduck, PT 02/24/2020, 1:16 PM  [facility] [place], [place], [address] Phone: [phone]   Fax:  [phone]  Name: Marbeth Rahming MRN: [id number] Date of Birth: 05-10-1946

## 2020-02-24 NOTE — Patient Instructions (Signed)
Access Code: [email or web address] URL: [email or web address] Date: 02/24/2020 Prepared by: Rudell Cobb  Exercises Romberg Stance with Eyes Closed - 2 x daily - 7 x weekly - 1 sets - 3 reps - 20-30 seconds hold Heel Toe Raises with Counter Support - 2 x daily - 7 x weekly - 1 sets - 10 reps Gastroc Stretch on Wall - 2 x daily - 7 x weekly - 1 sets - 2 reps - 30 seconds hold

## 2020-02-28 ENCOUNTER — Other Ambulatory Visit: Payer: Self-pay

## 2020-02-28 ENCOUNTER — Ambulatory Visit (INDEPENDENT_AMBULATORY_CARE_PROVIDER_SITE_OTHER): Payer: Medicare Other | Admitting: Rehabilitative and Restorative Service Providers"

## 2020-02-28 ENCOUNTER — Encounter: Payer: Self-pay | Admitting: Rehabilitative and Restorative Service Providers"

## 2020-02-28 DIAGNOSIS — M6281 Muscle weakness (generalized): Secondary | ICD-10-CM | POA: Diagnosis not present

## 2020-02-28 DIAGNOSIS — R29898 Other symptoms and signs involving the musculoskeletal system: Secondary | ICD-10-CM | POA: Diagnosis not present

## 2020-02-28 DIAGNOSIS — R2689 Other abnormalities of gait and mobility: Secondary | ICD-10-CM

## 2020-02-28 NOTE — Therapy (Signed)
[facility] [place], [place], [address] Phone: [phone]   Fax:  [phone]  Physical Therapy Treatment  Patient Details  Name: Katherine Nixon MRN: [id number] Date of Birth: 1945/12/03 Referring Provider (PT): Beatrice Lecher, MD   Encounter Date: 02/28/2020  PT End of Session - 02/28/20 1149    Visit Number  2    Number of Visits  12    Date for PT Re-Evaluation  04/06/20    PT Start Time  1146    PT Stop Time  1240    PT Time Calculation (min)  54 min       Past Medical History:  Diagnosis Date  . Anxiety   . Basal cell carcinoma 01/2018   "upper lip"  . Chronic diarrhea   . Complication of anesthesia 1989   diff to wake up with gallbaldder surgery- no problems since (03/12/2018)  . Connective tissue disorder ([place])    "lupus"  . Depression   . Dysrhythmia     states at times, last occurence was january 2019 feels like heart fluttering , last a couple seconds, denies any associated cardiac symtpoms. rpeorts at her last annual check up her pcp Metheney  heard it on pysical  exam and ordred an ECHO , per patietn no one reached out to her to set it up   . Heart murmur   . High cholesterol   . History of blood transfusion 1970   "related to childbirth"  . Hypertension   . IBS (irritable bowel syndrome)   . Migraine    "none since ~ 1980" (03/12/2018)  . Neuromuscular disorder (HCC)    lupus  . Osteoarthritis    "all my joints" (03/12/2018)  . Squamous cell carcinoma    "right arm"    Past Surgical History:  Procedure Laterality Date  . BASAL CELL CARCINOMA EXCISION  01/2018   "upper lip"  . BREAST BIOPSY Left     neg  . BREAST CYST ASPIRATION Right   . CATARACT EXTRACTION W/ INTRAOCULAR LENS  IMPLANT, BILATERAL    . CHOLECYSTECTOMY OPEN  1989  . COLONOSCOPY    . DILATION AND CURETTAGE OF UTERUS    . JOINT REPLACEMENT    . KNEE ARTHROSCOPY Bilateral   . NASAL SINUS SURGERY      "severe infection"  . SHOULDER ARTHROSCOPY Left 12/2014   Dr. Tamera Punt   . SQUAMOUS CELL CARCINOMA EXCISION Right    arm  . TOTAL HIP ARTHROPLASTY Right 08/31/2016   Procedure: TOTAL HIP ARTHROPLASTY ANTERIOR APPROACH;  Surgeon: Frederik Pear, MD;  Location: [facility];  Service: Orthopedics;  Laterality: Right;  . TOTAL KNEE ARTHROPLASTY Right 03/12/2018   Procedure: TOTAL KNEE ARTHROPLASTY;  Surgeon: Frederik Pear, MD;  Location: [facility];  Service: Orthopedics;  Laterality: Right;  . TOTAL KNEE ARTHROPLASTY    . TOTAL KNEE ARTHROPLASTY Left 01/15/2019   Procedure: TOTAL KNEE ARTHROPLASTY;  Surgeon: Melrose Nakayama, MD;  Location: [facility];  Service: Orthopedics;  Laterality: Left;  Marland Kitchen VARICOSE VEIN SURGERY Bilateral     There were no vitals filed for this visit.  Subjective Assessment - 02/28/20 1146    Subjective  The patient is tolerating exercises at home well.    Pertinent History  L knee TKA 2020, R knee TKA 2019, R THA 2018, h/o shoulder surgery L side,  notes leg length discrepancy (L shorter), h/o low back pain, hypertension, lupus    Patient Stated Goals  improve balance  Currently in Pain?  No/denies                       [facility] Adult PT Treatment/Exercise - 02/28/20 1150      Ambulation/Gait   Ambulation/Gait  Yes    Ambulation/Gait Assistance  6: Modified independent (Device/Increase time)    Ambulation Distance (Feet)  400 Feet    Assistive device  None    Ambulation Surface  Level;Indoor    Gait Comments  Gait walking backwards, performing 180 degree turns, walking on toes, walking on heels all with CGA for safety.      Neuro Re-ed    Neuro Re-ed Details   Standing with quarter turns working on stepp R x 10 reps, then L with CGA for safety.  Corner standing with narrow base + head motion horiz and vertical x 10 reps, then performed cross body reaching and habituatio reaching to knees and upright without provoking dizziness today.  Sidestepping x 10 feet right and  left x 2 reps.  Marching with CGA for safety x 50 feet.      Exercises   Exercises  Knee/Hip;Lumbar;Shoulder      Lumbar Exercises: Supine   Dead Bug  5 reps    Dead Bug Limitations  with alternating leg taps R and L for core engagement    Bridge  5 reps    Bridge Limitations  segmental bridge to reduce pain      Knee/Hip Exercises: Aerobic   Nustep  L5 x 3 minutes      Knee/Hip Exercises: Standing   Lateral Step Up  Right;Left;10 reps    Forward Step Up  Right;Left;10 reps      Shoulder Exercises: Supine   Horizontal ABduction  AROM;Both;12 reps    Horizontal ABduction Limitations  for chest opening and postural training    Flexion  AROM;Both;10 reps    Flexion Limitations  for postural stretch to improve thoracic extension    Other Supine Exercises  neck retraction x 10 reps with tactile cues and verbal cues to avoid shoulders lifting      Shoulder Exercises: Standing   Retraction  Strengthening;Both;10 reps    Retraction Limitations  with pool noodle and cues for cervical elongation posteriorly      Ankle Exercises: Stretches   Gastroc Stretch  2 reps;30 seconds    Slant Board Stretch  1 rep;60 seconds    Other Stretch  stretched off edge of step       Vestibular Treatment/Exercise - 02/28/20 1247      Vestibular Treatment/Exercise   Vestibular Treatment Provided  Habituation    Habituation Exercises  Horizontal Roll      Horizontal Roll   Number of Reps   3    Symptom Description   with mild sensation of dizziness during 1 rep to the right; not able to view nystagmus in room light                 PT Long Term Goals - 02/24/20 1305      PT LONG TERM GOAL #1   Title  The patient will be indep with HEP for LE strengthening, posture, high level balance.    Time  6    Period  Weeks    Target Date  04/06/20      PT LONG TERM GOAL #2   Title  The patient will improve 5 time sit<>stand from 20.83 seconds to < or equal to 16 seconds.  Time  6    Period   Weeks    Target Date  04/06/20      PT LONG TERM GOAL #3   Title  The patient will improve Berg balance score from 47/56 to > or equal to 52/56.    Time  6    Period  Weeks    Target Date  04/06/20      PT LONG TERM GOAL #4   Title  The patient will improve gait speed from 2.23 ft/sec to > or equal to 2.8 ft/sec indicating improved community gait.    Time  6    Period  Weeks    Target Date  04/06/20      PT LONG TERM GOAL #5   Title  The patient will be further assessed for BPPV as indicated and treatment to follow.    Time  6    Period  Weeks    Target Date  04/06/20            Plan - 02/28/20 1246    Clinical Impression Statement  The patient tolerates progressive strengthening today.  She notes some difficulty getting into kneeling at church and up from the floor--PT to address.  Continue working to [employer] progressing ot patient tolerance.    Stability/Clinical Decision Making  Stable/Uncomplicated    Rehab Potential  Good    PT Frequency  2x / week    PT Duration  6 weeks    PT Treatment/Interventions  ADLs/Self Care Home Management;Gait training;Stair training;Functional mobility training;Therapeutic activities;Therapeutic exercise;Balance training;Neuromuscular re-education;Patient/family education;Canalith Repostioning;Vestibular    PT Next Visit Plan  check horiz canal BPPV, floor transfers, Progress HEP, ankle strengthening, hip strengthening, postural stretching (upper thoracic and c-spine), postural stabilization, dynamic balance, dynamic gait    PT Home Exercise Plan  Access Code: [email or web address]    Consulted and Agree with Plan of Care  Patient       Patient will benefit from skilled therapeutic intervention in order to improve the following deficits and impairments:  Abnormal gait, Decreased activity tolerance, Decreased balance, Decreased strength, Decreased mobility, Postural dysfunction, Impaired sensation, Impaired flexibility, Dizziness  Visit Diagnosis: Muscle  weakness (generalized)  Other abnormalities of gait and mobility  Other symptoms and signs involving the musculoskeletal system     Problem List Patient Active Problem List   Diagnosis Date Noted  . Left flank pain 02/11/2020  . Primary osteoarthritis of left knee 01/15/2019  . 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  . Balance problem 10/11/2018  . Heart murmur 10/11/2018  . Pain of right sacroiliac joint 04/04/2018  . Primary osteoarthritis of right knee 03/12/2018  . Anxiety state 03/12/2018  . Chest pain syndrome 03/12/2018  . Degenerative arthritis of left knee 03/08/2018  . Irritable bowel syndrome with both constipation and diarrhea 12/26/2017  . Primary localized osteoarthritis of right hip 08/31/2016  . Arthritis of right hip 08/31/2016  . Constipation, chronic 03/10/2016  . Elevated alkaline phosphatase level 11/24/2015  . Primary osteoarthritis of both first carpometacarpal joints 09/22/2015  . Right shoulder pain 02/23/2015  . Chronic pain syndrome 02/06/2015  . Bilateral carpal tunnel syndrome 01/19/2015  . Dyssomnia 06/15/2014  . Left shoulder pain 06/15/2014  . Dry mouth 06/15/2014  . DDD (degenerative disc disease), lumbar 06/15/2014  . Insomnia 04/30/2014  . Actinic porokeratosis 02/27/2014  . ANA positive 01/24/2014  . History of arthroscopy of left shoulder 01/17/2014  . Strain of gastrocnemius tendon of right lower extremity 12/16/2013  . Osteoarthritis  of both knees 11/11/2013  . Lumbar degenerative disc disease 11/11/2013  . Osteoarthritis, hip, bilateral 10/14/2013  . Lupus (systemic lupus erythematosus) ([place]) 05/29/2013  . Chronic venous insufficiency 12/14/2012  . Hyperlipemia 06/02/2011  . Atrophic vaginitis 06/01/2011  . Essential hypertension, benign 05/09/2011  . GERD (gastroesophageal reflux disease) 05/09/2011  . Varicose veins 05/09/2011  . Urinary incontinence 03/25/2011    Katherine Nixon , PT 02/28/2020, 12:48 PM  [facility] [place], [place], [address] Phone: [phone]   Fax:  [phone]  Name: Katherine Nixon MRN: [id number] Date of Birth: 1945/11/28

## 2020-03-02 ENCOUNTER — Other Ambulatory Visit: Payer: Self-pay

## 2020-03-02 ENCOUNTER — Ambulatory Visit (INDEPENDENT_AMBULATORY_CARE_PROVIDER_SITE_OTHER): Payer: Medicare Other | Admitting: Rehabilitative and Restorative Service Providers"

## 2020-03-02 DIAGNOSIS — R2689 Other abnormalities of gait and mobility: Secondary | ICD-10-CM | POA: Diagnosis not present

## 2020-03-02 DIAGNOSIS — M6281 Muscle weakness (generalized): Secondary | ICD-10-CM | POA: Diagnosis not present

## 2020-03-02 DIAGNOSIS — R29898 Other symptoms and signs involving the musculoskeletal system: Secondary | ICD-10-CM

## 2020-03-02 NOTE — Therapy (Signed)
[place], [place], [address] Phone: [phone]   Fax:  [phone]  Physical Therapy Treatment  Patient Details  Name: Katherine Nixon MRN: [id number] Date of Birth: 21-Feb-1946 Referring Provider (PT): Beatrice Lecher, MD   Encounter Date: 03/02/2020  PT End of Session - 03/02/20 1432    Visit Number  3    Number of Visits  12    Date for PT Re-Evaluation  04/06/20    PT Start Time  [id number]    PT Stop Time  1431    PT Time Calculation (min)  46 min       Past Medical History:  Diagnosis Date  . Anxiety   . Basal cell carcinoma 01/2018   "upper lip"  . Chronic diarrhea   . Complication of anesthesia 1989   diff to wake up with gallbaldder surgery- no problems since (03/12/2018)  . Connective tissue disorder ([place])    "lupus"  . Depression   . Dysrhythmia     states at times, last occurence was january 2019 feels like heart fluttering , last a couple seconds, denies any associated cardiac symtpoms. rpeorts at her last annual check up her pcp Metheney  heard it on pysical  exam and ordred an ECHO , per patietn no one reached out to her to set it up   . Heart murmur   . High cholesterol   . History of blood transfusion 1970   "related to childbirth"  . Hypertension   . IBS (irritable bowel syndrome)   . Migraine    "none since ~ 1980" (03/12/2018)  . Neuromuscular disorder (HCC)    lupus  . Osteoarthritis    "all my joints" (03/12/2018)  . Squamous cell carcinoma    "right arm"    Past Surgical History:  Procedure Laterality Date  . BASAL CELL CARCINOMA EXCISION  01/2018   "upper lip"  . BREAST BIOPSY Left     neg  . BREAST CYST ASPIRATION Right   . CATARACT EXTRACTION W/ INTRAOCULAR LENS  IMPLANT, BILATERAL    . CHOLECYSTECTOMY OPEN  1989  . COLONOSCOPY    . DILATION AND CURETTAGE OF UTERUS    . JOINT REPLACEMENT    . KNEE ARTHROSCOPY Bilateral   . NASAL SINUS SURGERY     "severe infection"  . SHOULDER ARTHROSCOPY Left 12/2014   Dr. Tamera Punt   . SQUAMOUS CELL CARCINOMA EXCISION Right    arm  . TOTAL HIP ARTHROPLASTY Right 08/31/2016   Procedure: TOTAL HIP ARTHROPLASTY ANTERIOR APPROACH;  Surgeon: Frederik Pear, MD;  Location: [facility];  Service: Orthopedics;  Laterality: Right;  . TOTAL KNEE ARTHROPLASTY Right 03/12/2018   Procedure: TOTAL KNEE ARTHROPLASTY;  Surgeon: Frederik Pear, MD;  Location: [facility];  Service: Orthopedics;  Laterality: Right;  . TOTAL KNEE ARTHROPLASTY    . TOTAL KNEE ARTHROPLASTY Left 01/15/2019   Procedure: TOTAL KNEE ARTHROPLASTY;  Surgeon: Melrose Nakayama, MD;  Location[facility];  Service: Orthopedics;  Laterality: Left;  Marland Kitchen VARICOSE VEIN SURGERY Bilateral     There were no vitals filed for this visit.  Subjective Assessment - 03/02/20 1347    Subjective  The patient reported she was sore after last session.  She had some spasms in her lower legs and some sciatic pain that was temporary and cleared within a day.  She works on Saturdays 4-5 hours at the [employer].    Pertinent History  L knee TKA 2020, R knee  TKA 2019, R THA 2018, h/o shoulder surgery L side,  notes leg length discrepancy (L shorter), h/o low back pain, hypertension, lupus    Patient Stated Goals  improve balance    Currently in Pain?  No/denies                       [facility] Adult PT Treatment/Exercise - 03/02/20 1353      Ambulation/Gait   Ambulation/Gait  Yes    Ambulation/Gait Assistance  6: Modified independent (Device/Increase time)    Ambulation Distance (Feet)  350 Feet    Assistive device  None    Ambulation Surface  Level;Indoor    Pre-Gait Activities  Heel walking, toe walking, backwards walking, backwards walking with wider base of support.    Gait Comments  Gait activities working on longer stride with heel strike + tactile cues for UE/ arm swing.      Neuro Re-ed    Neuro Re-ed Details   Standing near a wall performing wall bumps for  hip strategy x 10 reps and then on compliant surfaces x 5 reps.  Added eyes closed x 3 reps with CGA for safety.  Negotiating cones with figure 8 pattern and turns for dynamic gait with CGA, standing cone tapping alternating R and L LEs with CGA and then sidestepping to next target.  "up/up/down/down" x 5 reps R and L leading at 6" step without UE support today.      Exercises   Exercises  Knee/Hip;Lumbar;Shoulder      Lumbar Exercises: Aerobic   Nustep  L4 x 4 minutes LEs only      Shoulder Exercises: Supine   Horizontal ABduction  AROM;Right;Left;10 reps    Other Supine Exercises  supine "T" position for anterior chest stretching    Other Supine Exercises  Chin tuck while in "T" position x 5 reps and then with arms at 45 degrees abduction x 5 reps      Shoulder Exercises: Standing   Retraction  Strengthening;Right;Left    Retraction Limitations  with pool noodle and cues for shoulder depression x 10 reps and then in push up plus position.      Other Standing Exercises  Standing step posterior with row (bow and arrow) movement working on core, UE strengthening, and balance.    Other Standing Exercises  wall lean with push up plus for scapular protraction/retraction.                  PT Long Term Goals - 02/24/20 1305      PT LONG TERM GOAL #1   Title  The patient will be indep with HEP for LE strengthening, posture, high level balance.    Time  6    Period  Weeks    Target Date  04/06/20      PT LONG TERM GOAL #2   Title  The patient will improve 5 time sit<>stand from 20.83 seconds to < or equal to 16 seconds.    Time  6    Period  Weeks    Target Date  04/06/20      PT LONG TERM GOAL #3   Title  The patient will improve Berg balance score from 47/56 to > or equal to 52/56.    Time  6    Period  Weeks    Target Date  04/06/20      PT LONG TERM GOAL #4   Title  The patient will improve gait  speed from 2.23 ft/sec to > or equal to 2.8 ft/sec indicating improved  community gait.    Time  6    Period  Weeks    Target Date  04/06/20      PT LONG TERM GOAL #5   Title  The patient will be further assessed for BPPV as indicated and treatment to follow.    Time  6    Period  Weeks    Target Date  04/06/20            Plan - 03/02/20 1747    Clinical Impression Statement  The patient had increased low back and LE soreness after last session.  PT focused on standing balance and postural strengthening at today's session emphasizing single limb activities and compliant surface balance control.  PT to progress strengthening to patient tolreance.  Also, patient had an episode of dizziness when rising from supine.  Plan to continue to assess.    Stability/Clinical Decision Making  Stable/Uncomplicated    Rehab Potential  Good    PT Frequency  2x / week    PT Duration  6 weeks    PT Treatment/Interventions  ADLs/Self Care Home Management;Gait training;Stair training;Functional mobility training;Therapeutic activities;Therapeutic exercise;Balance training;Neuromuscular re-education;Patient/family education;Canalith Repostioning;Vestibular    PT Next Visit Plan  check horiz canal BPPV, floor transfers, Progress HEP, ankle strengthening, hip strengthening, postural stretching (upper thoracic and c-spine), postural stabilization, dynamic balance, dynamic gait    PT Home Exercise Plan  Access Code: [email or web address]    Consulted and Agree with Plan of Care  Patient       Patient will benefit from skilled therapeutic intervention in order to improve the following deficits and impairments:  Abnormal gait, Decreased activity tolerance, Decreased balance, Decreased strength, Decreased mobility, Postural dysfunction, Impaired sensation, Impaired flexibility, Dizziness  Visit Diagnosis: Muscle weakness (generalized)  Other abnormalities of gait and mobility  Other symptoms and signs involving the musculoskeletal system     Problem List Patient Active Problem List    Diagnosis Date Noted  . Left flank pain 02/11/2020  . Primary osteoarthritis of left knee 01/15/2019  . 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  . Balance problem 10/11/2018  . Heart murmur 10/11/2018  . Pain of right sacroiliac joint 04/04/2018  . Primary osteoarthritis of right knee 03/12/2018  . Anxiety state 03/12/2018  . Chest pain syndrome 03/12/2018  . Degenerative arthritis of left knee 03/08/2018  . Irritable bowel syndrome with both constipation and diarrhea 12/26/2017  . Primary localized osteoarthritis of right hip 08/31/2016  . Arthritis of right hip 08/31/2016  . Constipation, chronic 03/10/2016  . Elevated alkaline phosphatase level 11/24/2015  . Primary osteoarthritis of both first carpometacarpal joints 09/22/2015  . Right shoulder pain 02/23/2015  . Chronic pain syndrome 02/06/2015  . Bilateral carpal tunnel syndrome 01/19/2015  . Dyssomnia 06/15/2014  . Left shoulder pain 06/15/2014  . Dry mouth 06/15/2014  . DDD (degenerative disc disease), lumbar 06/15/2014  . Insomnia 04/30/2014  . Actinic porokeratosis 02/27/2014  . ANA positive 01/24/2014  . History of arthroscopy of left shoulder 01/17/2014  . Strain of gastrocnemius tendon of right lower extremity 12/16/2013  . Osteoarthritis of both knees 11/11/2013  . Lumbar degenerative disc disease 11/11/2013  . Osteoarthritis, hip, bilateral 10/14/2013  . Lupus (systemic lupus erythematosus) ([place]) 05/29/2013  . Chronic venous insufficiency 12/14/2012  . Hyperlipemia 06/02/2011  . Atrophic vaginitis 06/01/2011  . Essential hypertension, benign 05/09/2011  . GERD (gastroesophageal reflux disease) 05/09/2011  . Varicose  veins 05/09/2011  . Urinary incontinence 03/25/2011    Lyn Joens, PT 03/02/2020, 5:52 PM  [facility] [place], [place], [address] Phone: [phone]   Fax:  [phone]  Name: Katherine Nixon MRN: [id number] Date  of Birth: [birth year]

## 2020-03-03 ENCOUNTER — Ambulatory Visit (INDEPENDENT_AMBULATORY_CARE_PROVIDER_SITE_OTHER): Payer: Medicare Other | Admitting: Family Medicine

## 2020-03-03 ENCOUNTER — Encounter: Payer: Self-pay | Admitting: Family Medicine

## 2020-03-03 VITALS — BP 139/63 | HR 63 | Ht 65.0 in | Wt 186.0 lb

## 2020-03-03 DIAGNOSIS — I872 Venous insufficiency (chronic) (peripheral): Secondary | ICD-10-CM | POA: Diagnosis not present

## 2020-03-03 DIAGNOSIS — M7989 Other specified soft tissue disorders: Secondary | ICD-10-CM | POA: Diagnosis not present

## 2020-03-03 DIAGNOSIS — G4700 Insomnia, unspecified: Secondary | ICD-10-CM

## 2020-03-03 DIAGNOSIS — R2689 Other abnormalities of gait and mobility: Secondary | ICD-10-CM | POA: Diagnosis not present

## 2020-03-03 DIAGNOSIS — I878 Other specified disorders of veins: Secondary | ICD-10-CM

## 2020-03-03 DIAGNOSIS — I10 Essential (primary) hypertension: Secondary | ICD-10-CM

## 2020-03-03 DIAGNOSIS — R109 Unspecified abdominal pain: Secondary | ICD-10-CM

## 2020-03-03 DIAGNOSIS — R10A2 Flank pain, left side: Secondary | ICD-10-CM

## 2020-03-03 NOTE — Assessment & Plan Note (Signed)
He has known chronic venous insufficiency.  Discussed this particular diagnosis and how it leads to chronic swelling that usually improves with elevation of the feet.  She has had something treatments in the past and certainly if she would like to consider that going forward she can let me know.  Unfortunately Medicare does not usually pay for these services.  We also discussed using compression stockings.  She says she has the kind come above the knee but does not like to wear them because they rolled down.  Make sure you are eating a low-salt diet as well.

## 2020-03-03 NOTE — Progress Notes (Signed)
Pt has been going to PT and told she has some issues w/Vertigo and hamstrings. She stated that she is doing better and doesn't feel that her episodes are as frequent.

## 2020-03-03 NOTE — Assessment & Plan Note (Signed)
CT had revealed a fair amount of stool though she did have a simple cyst measuring approximately 6 and half centimeters as well.  Since cleaning out her bowels she is actually felt much better.

## 2020-03-03 NOTE — Assessment & Plan Note (Signed)
Improved by 30% with PT.  She will finish up her session and continue her exercises at home.

## 2020-03-03 NOTE — Progress Notes (Signed)
Established Patient Office Visit  Subjective:  Patient ID: Katherine Nixon, female    DOB: [birth year]  Age: 74 y.o. MRN: [id number]  CC:  Chief Complaint  Patient presents with  . balance    HPI Katherine Nixon presents for follow-up.  She has been going to PT and feels it has been helpful for her balance and her hamstring.  She was having some muscle weakness as well as some gait abnormalities and vertigo.  They have been working on this at physical therapy she has gone for 3 sessions thus far and is actually found it really helpful in fact she reports about a 30% improvement in her symptoms.  She has about 3 more sessions and after that we will just continue to work on some things at home.  They have been really focusing on her posture as well as some core strengthening and leg strengthening.   Left flank pain-she also reports that this is improved.  Because of persistent pain we ended up doing a CT of the abdomen pelvis on March 29.  It did show a simple cyst measuring approximately 6.5 cm in the left kidney.  It did show a large amount of stool and gas in the colon even though she had been having normal bowel movements.  She said she went on a cabbage soup diet for a week and completely cleared out her colon.  She says that the left flank pain is significantly improved since then.  She still complains of some swelling particularly in her legs.  It is worse on the left compared to the right.  She has had varicose veins and has had them treated in the past.  She does not wear compression stockings and says by the end of the day even her knees feel a little bit stiff and swollen.  The swelling is usually better first thing in the morning.  Past Medical History:  Diagnosis Date  . Anxiety   . Basal cell carcinoma 01/2018   "upper lip"  . Chronic diarrhea   . Complication of anesthesia 1989   diff to wake up with gallbaldder surgery- no problems since (03/12/2018)  . Connective  tissue disorder ([place])    "lupus"  . Depression   . Dysrhythmia     states at times, last occurence was january 2019 feels like heart fluttering , last a couple seconds, denies any associated cardiac symtpoms. rpeorts at her last annual check up her pcp Katherine Nixon  heard it on pysical  exam and ordred an ECHO , per patietn no one reached out to her to set it up   . Heart murmur   . High cholesterol   . History of blood transfusion 1970   "related to childbirth"  . Hypertension   . IBS (irritable bowel syndrome)   . Migraine    "none since ~ 1980" (03/12/2018)  . Neuromuscular disorder (HCC)    lupus  . Osteoarthritis    "all my joints" (03/12/2018)  . Squamous cell carcinoma    "right arm"    Past Surgical History:  Procedure Laterality Date  . BASAL CELL CARCINOMA EXCISION  01/2018   "upper lip"  . BREAST BIOPSY Left     neg  . BREAST CYST ASPIRATION Right   . CATARACT EXTRACTION W/ INTRAOCULAR LENS  IMPLANT, BILATERAL    . CHOLECYSTECTOMY OPEN  1989  . COLONOSCOPY    . DILATION AND CURETTAGE OF UTERUS    . JOINT REPLACEMENT    .  KNEE ARTHROSCOPY Bilateral   . NASAL SINUS SURGERY     "severe infection"  . SHOULDER ARTHROSCOPY Left 12/2014   Dr. Tamera Punt   . SQUAMOUS CELL CARCINOMA EXCISION Right    arm  . TOTAL HIP ARTHROPLASTY Right 08/31/2016   Procedure: TOTAL HIP ARTHROPLASTY ANTERIOR APPROACH;  Surgeon: Frederik Pear, MD;  Location: [place];  Service: Orthopedics;  Laterality: Right;  . TOTAL KNEE ARTHROPLASTY Right 03/12/2018   Procedure: TOTAL KNEE ARTHROPLASTY;  Surgeon: Frederik Pear, MD;  Location: [place];  Service: Orthopedics;  Laterality: Right;  . TOTAL KNEE ARTHROPLASTY    . TOTAL KNEE ARTHROPLASTY Left 01/15/2019   Procedure: TOTAL KNEE ARTHROPLASTY;  Surgeon: Melrose Nakayama, MD;  Location: [facility];  Service: Orthopedics;  Laterality: Left;  Marland Kitchen VARICOSE VEIN SURGERY Bilateral     Family History  Problem Relation Age of Onset  . Heart disease Mother   . Heart  attack Mother   . Hyperlipidemia Father   . Other Father        amputaiton  . Hyperlipidemia Brother   . Hypertension Brother     Social History   Socioeconomic History  . Marital status: Married    Spouse name: Jimmie  . Number of children: 4  . Years of education: 74  . Highest education level: Some college, no degree  Occupational History  . 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 Smoking status: Former Smoker    Packs/day: 1.00    Years: 5.00    Pack years: 5.00    Types: Cigarettes    Quit date: 1989    Years since quitting: 32.3  . Smokeless tobacco: Never Used  Substance and Sexual Activity  . Alcohol use: No  . Drug use: No  . Sexual activity: Not Currently    Birth control/protection: Abstinence  Other Topics Concern  . Not on file  Social History Narrative   Caffeine- 3-4 cups a day   Chores around the house   Does crafting   Baking and selling at [employer] Strain:   . Difficulty of Paying Living Expenses:   Food Insecurity:   . Worried About Charity fundraiser in the Last Year:   . Arboriculturist in the Last Year:   Transportation Needs:   . Film/video editor (Medical):   Marland Kitchen Lack of Transportation (Non-Medical):   Physical Activity:   . Days of Exercise per Week:   . Minutes of Exercise per Session:   Stress:   . Feeling of Stress :   Social Connections:   . Frequency of Communication with Friends and Family:   . Frequency of Social Gatherings with Friends and Family:   . Attends Religious Services:   . Active Member of Clubs or Organizations:   . Attends Archivist Meetings:   Marland Kitchen Marital Status:   Intimate Partner Violence:   . Fear of Current or Ex-Partner:   . Emotionally Abused:   Marland Kitchen Physically Abused:   . Sexually Abused:     Outpatient Medications Prior to Visit  Medication Sig Dispense Refill  . amLODipine (NORVASC) 5  MG tablet TAKE 1 TABLET DAILY 90 tablet 0  . atorvastatin (LIPITOR) 20 MG tablet Take 1 tablet (20 mg total) by mouth daily at 6 PM. 90 tablet 3  . DULoxetine (CYMBALTA) 60 MG capsule TAKE 1 CAPSULE DAILY 90 capsule 3  .  hydrochlorothiazide (HYDRODIURIL) 25 MG tablet TAKE 1 TABLET DAILY (Patient taking differently: Take 12.5 mg by mouth daily at 6 PM. ) 90 tablet 4  . linaclotide (LINZESS) 72 MCG capsule Take 1 capsule (72 mcg total) by mouth daily. (Patient taking differently: Take 72 mcg by mouth daily at 6 PM. ) 90 capsule 3  . Multiple Vitamin (MULTIVITAMIN WITH MINERALS) TABS tablet Take 1 tablet by mouth daily.     Marland Kitchen tiZANidine (ZANAFLEX) 2 MG tablet Take 2 mg by mouth 2 (two) times daily as needed for muscle spasms.     . traZODone (DESYREL) 100 MG tablet TAKE 1 TABLET AT BEDTIME 90 tablet 0   No facility-administered medications prior to visit.    Allergies  Allergen Reactions  . Gabapentin Other (See Comments)    Hallucinations   . Tramadol Other (See Comments)    Hallucinations   . Tylenol [Acetaminophen] Other (See Comments)    Due to liver enzyme issue  . Pravastatin Other (See Comments)    Muscle cramping  . Sulfamethoxazole-Trimethoprim Other (See Comments)    Thrush in mouth    ROS Review of Systems    Objective:    Physical Exam  Constitutional: She is oriented to person, place, and time. She appears well-developed and well-nourished.  HENT:  Head: Normocephalic and atraumatic.  Cardiovascular: Normal rate, regular rhythm and normal heart sounds.  Pulmonary/Chest: Effort normal and breath sounds normal.  Musculoskeletal:     Comments: 1+ pitting edema of both lower extremities worse on the left ankle compared to the right.  She is a lot of varicose veins on the tops of her feet as well.  Neurological: She is alert and oriented to person, place, and time.  Skin: Skin is warm and dry.  Psychiatric: She has a normal mood and affect. Her behavior is normal.     BP 139/63   Pulse 63   Ht 5\' 5"  (1.651 m)   Wt 186 lb (84.4 kg)   SpO2 100%   BMI 30.95 kg/m  Wt Readings from Last 3 Encounters:  03/03/20 186 lb (84.4 kg)  02/11/20 187 lb (84.8 kg)  01/08/20 190 lb (86.2 kg)     There are no preventive care reminders to display for this patient.  There are no preventive care reminders to display for this patient.  Lab Results  Component Value Date   TSH 0.89 07/13/2017   Lab Results  Component Value Date   WBC 6.3 02/11/2020   HGB 12.5 02/11/2020   HCT 38.1 02/11/2020   MCV 93.6 02/11/2020   PLT 204 02/11/2020   Lab Results  Component Value Date   NA 141 02/11/2020   K 4.9 02/11/2020   CO2 29 02/11/2020   GLUCOSE 96 02/11/2020   BUN 17 02/11/2020   CREATININE 1.05 (H) 02/11/2020   BILITOT 0.5 02/11/2020   ALKPHOS 137 (A) 10/24/2016   AST 39 (H) 02/11/2020   ALT 31 (H) 02/11/2020   PROT 7.4 02/11/2020   ALBUMIN 3.9 07/11/2016   CALCIUM 10.4 02/11/2020   ANIONGAP 8 01/15/2019   Lab Results  Component Value Date   CHOL 166 02/11/2020   Lab Results  Component Value Date   HDL 65 02/11/2020   Lab Results  Component Value Date   LDLCALC 85 02/11/2020   Lab Results  Component Value Date   TRIG 70 02/11/2020   Lab Results  Component Value Date   CHOLHDL 2.6 02/11/2020   Lab Results  Component Value  Date   HGBA1C 5.6 03/12/2018      Assessment & Plan:   Problem List Items Addressed This Visit      Cardiovascular and Mediastinum   Essential hypertension, benign    Ports home blood pressures look fantastic usually less than 120.  With the average being around 118/65.  Doing well overall.  Continue hydrochlorothiazide and amlodipine.  Recent creatinine stable.  She does take amlodipine which can increase lower extremity swelling.      Chronic venous insufficiency    He has known chronic venous insufficiency.  Discussed this particular diagnosis and how it leads to chronic swelling that usually improves  with elevation of the feet.  She has had something treatments in the past and certainly if she would like to consider that going forward she can let me know.  Unfortunately Medicare does not usually pay for these services.  We also discussed using compression stockings.  She says she has the kind come above the knee but does not like to wear them because they rolled down.  Make sure you are eating a low-salt diet as well.        Other   Venous stasis   Left flank pain    CT had revealed a fair amount of stool though she did have a simple cyst measuring approximately 6 and half centimeters as well.  Since cleaning out her bowels she is actually felt much better.      Insomnia   Balance problem - Primary    Improved by 30% with PT.  She will finish up her session and continue her exercises at home.         Other Visit Diagnoses    Swelling of lower extremity          No orders of the defined types were placed in this encounter.   Follow-up: Return in about 6 months (around 09/02/2020) for Hypertension.    Beatrice Lecher, MD

## 2020-03-03 NOTE — Assessment & Plan Note (Addendum)
Ports home blood pressures look fantastic usually less than 120.  With the average being around 118/65.  Doing well overall.  Continue hydrochlorothiazide and amlodipine.  Recent creatinine stable.  She does take amlodipine which can increase lower extremity swelling.

## 2020-03-05 ENCOUNTER — Other Ambulatory Visit: Payer: Self-pay

## 2020-03-05 ENCOUNTER — Ambulatory Visit (INDEPENDENT_AMBULATORY_CARE_PROVIDER_SITE_OTHER): Payer: Medicare Other | Admitting: Physical Therapy

## 2020-03-05 ENCOUNTER — Encounter: Payer: Self-pay | Admitting: Physical Therapy

## 2020-03-05 DIAGNOSIS — R29898 Other symptoms and signs involving the musculoskeletal system: Secondary | ICD-10-CM

## 2020-03-05 DIAGNOSIS — R2689 Other abnormalities of gait and mobility: Secondary | ICD-10-CM | POA: Diagnosis not present

## 2020-03-05 DIAGNOSIS — M6281 Muscle weakness (generalized): Secondary | ICD-10-CM | POA: Diagnosis not present

## 2020-03-05 NOTE — Therapy (Signed)
[place], [place], [address] Phone: [phone]   Fax:  [phone]  Physical Therapy Treatment  Patient Details  Name: Katherine Nixon MRN: [id number] Date of Birth: 09/14/1946 Referring Provider (PT): Beatrice Lecher, MD   Encounter Date: 03/05/2020  PT End of Session - 03/05/20 1621    Visit Number  4    Number of Visits  12    Date for PT Re-Evaluation  04/06/20    PT Start Time  1450    PT Stop Time  1530    PT Time Calculation (min)  40 min    Activity Tolerance  Patient tolerated treatment well    Behavior During Therapy  [facility] for tasks assessed/performed       Past Medical History:  Diagnosis Date  . Anxiety   . Basal cell carcinoma 01/2018   "upper lip"  . Chronic diarrhea   . Complication of anesthesia 1989   diff to wake up with gallbaldder surgery- no problems since (03/12/2018)  . Connective tissue disorder ([place])    "lupus"  . Depression   . Dysrhythmia     states at times, last occurence was january 2019 feels like heart fluttering , last a couple seconds, denies any associated cardiac symtpoms. rpeorts at her last annual check up her pcp Met[place]  heard it on pysical  exam and ordred an ECHO , per patietn no one reached out to her to set it up   . Heart murmur   . High cholesterol   . History of blood transfusion 1970   "related to childbirth"  . Hypertension   . IBS (irritable bowel syndrome)   . Migraine    "none since ~ 1980" (03/12/2018)  . Neuromuscular disorder (HCC)    lupus  . Osteoarthritis    "all my joints" (03/12/2018)  . Squamous cell carcinoma    "right arm"    Past Surgical History:  Procedure Laterality Date  . BASAL CELL CARCINOMA EXCISION  01/2018   "upper lip"  . BREAST BIOPSY Left     neg  . BREAST CYST ASPIRATION Right   . CATARACT EXTRACTION W/ INTRAOCULAR LENS  IMPLANT, BILATERAL    . CHOLECYSTECTOMY OPEN  1989  . COLONOSCOPY    .  DILATION AND CURETTAGE OF UTERUS    . JOINT REPLACEMENT    . KNEE ARTHROSCOPY Bilateral   . NASAL SINUS SURGERY     "severe infection"  . SHOULDER ARTHROSCOPY Left 12/2014   Dr. Tamera Punt   . SQUAMOUS CELL CARCINOMA EXCISION Right    arm  . TOTAL HIP ARTHROPLASTY Right 08/31/2016   Procedure: TOTAL HIP ARTHROPLASTY ANTERIOR APPROACH;  Surgeon: Frederik Pear, MD;  Location: [place];  Service: Orthopedics;  Laterality: Right;  . TOTAL KNEE ARTHROPLASTY Right 03/12/2018   Procedure: TOTAL KNEE ARTHROPLASTY;  Surgeon: Frederik Pear, MD;  Location: [place];  Service: Orthopedics;  Laterality: Right;  . TOTAL KNEE ARTHROPLASTY    . TOTAL KNEE ARTHROPLASTY Left 01/15/2019   Procedure: TOTAL KNEE ARTHROPLASTY;  Surgeon: Melrose Nakayama, MD;  Location[facility];  Service: Orthopedics;  Laterality: Left;  Marland Kitchen VARICOSE VEIN SURGERY Bilateral     There were no vitals filed for this visit.  Subjective Assessment - 03/05/20 1451    Subjective  "I think I'm doing well".  She states she hasn't been as sore after last session as she was the time before. She noticed two episodes of dizziness when leaning over  with head down cleaning; but overall balance has has been better.    Pertinent History  L knee TKA 2020, R knee TKA 2019, R THA 2018, h/o shoulder surgery L side,  notes leg length discrepancy (L shorter), h/o low back pain, hypertension, lupus    Currently in Pain?  No/denies         [facility] PT Assessment - 03/05/20 0001      Assessment   Medical Diagnosis  Imbalance    Referring Provider (PT)  Beatrice Lecher, MD    Onset Date/Surgical Date  02/11/20    Hand Dominance  Right    Prior Therapy  prior therapy for musculoskeletal issues       [facility] Adult PT Treatment/Exercise - 03/05/20 0001      Lumbar Exercises: Stretches   Passive Hamstring Stretch  Right;Left;2 reps;20 seconds   seated with straight back   Hip Flexor Stretch  Right;Left;2 reps;30 seconds      Lumbar Exercises: Seated   Sit to  Stand  --   8 reps, cues to slow descent     Knee/Hip Exercises: Standing   Forward Step Up  Right;Left;1 set;10 reps;Hand Hold: 0;Step Height: 6"      Shoulder Exercises: Supine   Other Supine Exercises  bilat arms in T, then 10 snow angels to tolerance     Other Supine Exercises  hooklying back of palms on forehead for stretch (similar to star gazer) x 20 sec x 3 reps       Balance Exercises - 03/05/20 1539      Balance Exercises: Standing   Standing Eyes Opened  Narrow base of support (BOS);Foam/compliant surface;2 reps;20 secs    Standing Eyes Closed  Wide (BOA);Foam/compliant surface;3 reps;10 secs   CGA for safety   Tandem Stance  Eyes open;Intermittent upper extremity support;2 reps;15 secs    Wall Bumps  Hip;Eyes opened;10 reps    Tandem Gait  Forward;Retro;2 reps   CGA for safety   Partial Tandem Stance  Eyes open;1 rep;25 secs             PT Long Term Goals - 02/24/20 1305      PT LONG TERM GOAL #1   Title  The patient will be indep with HEP for LE strengthening, posture, high level balance.    Time  6    Period  Weeks    Target Date  04/06/20      PT LONG TERM GOAL #2   Title  The patient will improve 5 time sit<>stand from 20.83 seconds to < or equal to 16 seconds.    Time  6    Period  Weeks    Target Date  04/06/20      PT LONG TERM GOAL #3   Title  The patient will improve Berg balance score from 47/56 to > or equal to 52/56.    Time  6    Period  Weeks    Target Date  04/06/20      PT LONG TERM GOAL #4   Title  The patient will improve gait speed from 2.23 ft/sec to > or equal to 2.8 ft/sec indicating improved community gait.    Time  6    Period  Weeks    Target Date  04/06/20      PT LONG TERM GOAL #5   Title  The patient will be further assessed for BPPV as indicated and treatment to follow.    Time  6  Period  Weeks    Target Date  04/06/20            Plan - 03/05/20 1624    Clinical Impression Statement  Pt had loss of  balance requiring min A to recover with EC with narrow BOS on foam, as well as tandem stance with horiz head turns. Tandem walking went well.  Added shoulder stretch for posture and LE stretches as well. Pt had brief episode of diziness when rising from supine.  Progressing towards goals.    Stability/Clinical Decision Making  Stable/Uncomplicated    Rehab Potential  Good    PT Frequency  2x / week    PT Duration  6 weeks    PT Treatment/Interventions  ADLs/Self Care Home Management;Gait training;Stair training;Functional mobility training;Therapeutic activities;Therapeutic exercise;Balance training;Neuromuscular re-education;Patient/family education;Canalith Repostioning;Vestibular    PT Next Visit Plan  check horiz canal BPPV, floor transfers, Progress HEP, ankle strengthening, hip strengthening, postural stretching (upper thoracic and c-spine), postural stabilization, dynamic balance, dynamic gait    PT Home Exercise Plan  Access Code: [email or web address]    Consulted and Agree with Plan of Care  Patient       Patient will benefit from skilled therapeutic intervention in order to improve the following deficits and impairments:  Abnormal gait, Decreased activity tolerance, Decreased balance, Decreased strength, Decreased mobility, Postural dysfunction, Impaired sensation, Impaired flexibility, Dizziness  Visit Diagnosis: Muscle weakness (generalized)  Other abnormalities of gait and mobility  Other symptoms and signs involving the musculoskeletal system     Problem List Patient Active Problem List   Diagnosis Date Noted  . Venous stasis 03/03/2020  . Left flank pain 02/11/2020  . Primary osteoarthritis of left knee 01/15/2019  . 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  . Balance problem 10/11/2018  . Heart murmur 10/11/2018  . Pain of right sacroiliac joint 04/04/2018  . Primary osteoarthritis of right knee 03/12/2018  . Anxiety state 03/12/2018  . Chest pain syndrome 03/12/2018  .  Degenerative arthritis of left knee 03/08/2018  . Irritable bowel syndrome with both constipation and diarrhea 12/26/2017  . Primary localized osteoarthritis of right hip 08/31/2016  . Arthritis of right hip 08/31/2016  . Constipation, chronic 03/10/2016  . Elevated alkaline phosphatase level 11/24/2015  . Primary osteoarthritis of both first carpometacarpal joints 09/22/2015  . Right shoulder pain 02/23/2015  . Chronic pain syndrome 02/06/2015  . Bilateral carpal tunnel syndrome 01/19/2015  . Dyssomnia 06/15/2014  . Left shoulder pain 06/15/2014  . Dry mouth 06/15/2014  . DDD (degenerative disc disease), lumbar 06/15/2014  . Insomnia 04/30/2014  . Actinic porokeratosis 02/27/2014  . ANA positive 01/24/2014  . History of arthroscopy of left shoulder 01/17/2014  . Strain of gastrocnemius tendon of right lower extremity 12/16/2013  . Osteoarthritis of both knees 11/11/2013  . Lumbar degenerative disc disease 11/11/2013  . Osteoarthritis, hip, bilateral 10/14/2013  . Lupus (systemic lupus erythematosus) ([place]) 05/29/2013  . Chronic venous insufficiency 12/14/2012  . Hyperlipemia 06/02/2011  . Atrophic vaginitis 06/01/2011  . Essential hypertension, benign 05/09/2011  . GERD (gastroesophageal reflux disease) 05/09/2011  . Varicose veins 05/09/2011  . Urinary incontinence 03/25/2011   Kerin Perna, PTA 03/05/20 5:30 PM  [place], [place], [address] Phone: [phone]   Fax:  [phone]  Name: Katherine Nixon MRN: [id number] Date of Birth: 08/10/1946

## 2020-03-11 ENCOUNTER — Other Ambulatory Visit: Payer: Self-pay

## 2020-03-11 ENCOUNTER — Ambulatory Visit (INDEPENDENT_AMBULATORY_CARE_PROVIDER_SITE_OTHER): Payer: Medicare Other | Admitting: Rehabilitative and Restorative Service Providers"

## 2020-03-11 ENCOUNTER — Encounter: Payer: Self-pay | Admitting: Rehabilitative and Restorative Service Providers"

## 2020-03-11 DIAGNOSIS — M6281 Muscle weakness (generalized): Secondary | ICD-10-CM

## 2020-03-11 DIAGNOSIS — R2689 Other abnormalities of gait and mobility: Secondary | ICD-10-CM | POA: Diagnosis not present

## 2020-03-11 DIAGNOSIS — R29898 Other symptoms and signs involving the musculoskeletal system: Secondary | ICD-10-CM | POA: Diagnosis not present

## 2020-03-11 NOTE — Patient Instructions (Signed)
Access Code: [email or web address] URL: [email or web address] Date: 03/11/2020 Prepared by: Rudell Cobb  Exercises Doorway Pec Stretch at 60 Elevation - 2 x daily - 7 x weekly - 1 sets - 2-3 reps - 20 seconds hold Heel Toe Raises with Counter Support - 2 x daily - 7 x weekly - 1 sets - 10 reps Gastroc Stretch on Wall - 2 x daily - 7 x weekly - 1 sets - 2 reps - 30 seconds hold Step Up - 2 x daily - 7 x weekly - 1 sets - 10 reps Seated Hamstring Stretch - 2 x daily - 7 x weekly - 1 sets - 2-3 reps - 20 seconds hold Squat with Chair Touch - 2 x daily - 7 x weekly - 10 reps - 1 sets Half Tandem Stance Balance with Eyes Closed - 2 x daily - 7 x weekly - 1 sets - 3 reps - 20 seconds hold

## 2020-03-11 NOTE — Therapy (Signed)
[place], [place], [address] Phone: [phone]   Fax:  [phone]  Physical Therapy Evaluation  Patient Details  Name: Katherine Nixon MRN: [id number] Date of Birth: 20-Nov-1946 Referring Provider (PT): Beatrice Lecher, MD   Encounter Date: 03/11/2020  PT End of Session - 03/11/20 1025    Visit Number  5    Number of Visits  12    Date for PT Re-Evaluation  04/06/20    PT Start Time  1020    PT Stop Time  1100    PT Time Calculation (min)  40 min    Activity Tolerance  Patient tolerated treatment well    Behavior During Therapy  [facility] for tasks assessed/performed       Past Medical History:  Diagnosis Date  . Anxiety   . Basal cell carcinoma 01/2018   "upper lip"  . Chronic diarrhea   . Complication of anesthesia 1989   diff to wake up with gallbaldder surgery- no problems since (03/12/2018)  . Connective tissue disorder ([place])    "lupus"  . Depression   . Dysrhythmia     states at times, last occurence was january 2019 feels like heart fluttering , last a couple seconds, denies any associated cardiac symtpoms. rpeorts at her last annual check up her pcp Metheney  heard it on pysical  exam and ordred an ECHO , per patietn no one reached out to her to set it up   . Heart murmur   . High cholesterol   . History of blood transfusion 1970   "related to childbirth"  . Hypertension   . IBS (irritable bowel syndrome)   . Migraine    "none since ~ 1980" (03/12/2018)  . Neuromuscular disorder (HCC)    lupus  . Osteoarthritis    "all my joints" (03/12/2018)  . Squamous cell carcinoma    "right arm"    Past Surgical History:  Procedure Laterality Date  . BASAL CELL CARCINOMA EXCISION  01/2018   "upper lip"  . BREAST BIOPSY Left     neg  . BREAST CYST ASPIRATION Right   . CATARACT EXTRACTION W/ INTRAOCULAR LENS  IMPLANT, BILATERAL    . CHOLECYSTECTOMY OPEN  1989  . COLONOSCOPY    .  DILATION AND CURETTAGE OF UTERUS    . JOINT REPLACEMENT    . KNEE ARTHROSCOPY Bilateral   . NASAL SINUS SURGERY     "severe infection"  . SHOULDER ARTHROSCOPY Left 12/2014   Dr. Tamera Punt   . SQUAMOUS CELL CARCINOMA EXCISION Right    arm  . TOTAL HIP ARTHROPLASTY Right 08/31/2016   Procedure: TOTAL HIP ARTHROPLASTY ANTERIOR APPROACH;  Surgeon: Frederik Pear, MD;  Location: [place];  Service: Orthopedics;  Laterality: Right;  . TOTAL KNEE ARTHROPLASTY Right 03/12/2018   Procedure: TOTAL KNEE ARTHROPLASTY;  Surgeon: Frederik Pear, MD;  Location: [place];  Service: Orthopedics;  Laterality: Right;  . TOTAL KNEE ARTHROPLASTY    . TOTAL KNEE ARTHROPLASTY Left 01/15/2019   Procedure: TOTAL KNEE ARTHROPLASTY;  Surgeon: Melrose Nakayama, MD;  [facility];  Service: Orthopedics;  Laterality: Left;  Marland Kitchen VARICOSE VEIN SURGERY Bilateral     There were no vitals filed for this visit.   Subjective Assessment - 03/11/20 1040    Subjective  The patient feels she is doing well with balance.  She gets intermittent vertigo when she is in bed, worse when rolling from her left onto her back.  Pertinent History  L knee TKA 2020, R knee TKA 2019, R THA 2018, h/o shoulder surgery L side,  notes leg length discrepancy (L shorter), h/o low back pain, hypertension, lupus    Patient Stated Goals  improve balance    Currently in Pain?  No/denies         [facility] PT Assessment - 03/11/20 1044      Transfers   Five time sit to stand comments   17.34 seconds      Ambulation/Gait   Ambulation/Gait  Yes    Ambulation/Gait Assistance  7: Independent    Ambulation Distance (Feet)  350 Feet    Assistive device  None    Gait velocity  3.67 ft/sec      Berg Balance Test   Sit to Stand  Able to stand without using hands and stabilize independently    Standing Unsupported  Able to stand safely 2 minutes    Sitting with Back Unsupported but Feet Supported on Floor or Stool  Able to sit safely and securely 2 minutes     Stand to Sit  Sits safely with minimal use of hands    Transfers  Able to transfer safely, minor use of hands    Standing Unsupported with Eyes Closed  Able to stand 10 seconds safely    Standing Unsupported with Feet Together  Able to place feet together independently and stand 1 minute safely    From Standing, Reach Forward with Outstretched Arm  Can reach confidently >25 cm (10")    From Standing Position, Pick up Object from Floor  Able to pick up shoe safely and easily    From Standing Position, Turn to Look Behind Over each Shoulder  Turn sideways only but maintains balance    Turn 360 Degrees  Able to turn 360 degrees safely in 4 seconds or less    Standing Unsupported, Alternately Place Feet on Step/Stool  Able to stand independently and safely and complete 8 steps in 20 seconds    Standing Unsupported, One Foot in Front  Able to plae foot ahead of the other independently and hold 30 seconds    Standing on One Leg  Able to lift leg independently and hold 5-10 seconds    Total Score  52    Berg comment:  52/56           Vestibular Assessment - 03/11/20 1026      Vestibular Assessment   General Observation  Patient continues with intermittent dizziness when moving R sidelying to supine.      Positional Testing   Horizontal Canal Testing  Horizontal Canal Right;Horizontal Canal Left      Horizontal Canal Right   Horizontal Canal Right Duration  none    Horizontal Canal Right Symptoms  Normal      Horizontal Canal Left   Horizontal Canal Left Duration  none    Horizontal Canal Left Symptoms  Normal          Objective measurements completed on examination: See above findings.      [place] Adult PT Treatment/Exercise - 03/11/20 1044      Therapeutic Activites    Therapeutic Activities  Other Therapeutic Activities    Other Therapeutic Activities  floor transfers moving from standing to knees with UE support      Neuro Re-ed    Neuro Re-ed Details   The patient  performed standing 360 degree turns R and L x 3 reps each direction with some imbalance--  able to self recover.  Worked on narrowing base of support + eyes closed.  Sidestepping x 20 feet R and L with cues for posture.  Wide leg to narrow step ups/step downs x 5 reps R and L sides leading.  Alternating toe taps to 6" surface with SBA.      Exercises   Exercises  Other Exercises;Ankle    Other Exercises   Standing UBE for balance + posture x 2 minutes      Ankle Exercises: Stretches   Slant Board Stretch  1 rep;60 seconds      Vestibular Treatment/Exercise - 03/11/20 1030      Vestibular Treatment/Exercise   Vestibular Treatment Provided  Habituation;Gaze    Gaze Exercises  X1 Viewing Horizontal      Horizontal Roll   Number of Reps   3    Symptom Description   no dizziness today      X1 Viewing Horizontal   Foot Position  standing feet apart    Comments  performed 15 reps x 2 sets with nausea 4/10.  Needed cues for gaze fixation.            PT Education - 03/11/20 1102    Education Details  HEP progression    Person(s) Educated  Patient    Methods  Explanation;Demonstration;Handout    Comprehension  Verbalized understanding;Returned demonstration          PT Long Term Goals - 03/11/20 1044      PT LONG TERM GOAL #1   Title  The patient will be indep with HEP for LE strengthening, posture, high level balance.    Time  6    Period  Weeks      PT LONG TERM GOAL #2   Title  The patient will improve 5 time sit<>stand from 20.83 seconds to < or equal to 16 seconds.    Baseline  17.34 seconds    Time  6    Period  Weeks    Status  Partially Met      PT LONG TERM GOAL #3   Title  The patient will improve Berg balance score from 47/56 to > or equal to 52/56.    Baseline  52/56    Time  6    Period  Weeks    Status  Achieved      PT LONG TERM GOAL #4   Title  The patient will improve gait speed from 2.23 ft/sec to > or equal to 2.8 ft/sec indicating improved  community gait.    Baseline  3.67 ft/sec    Time  6    Period  Weeks    Status  Achieved      PT LONG TERM GOAL #5   Title  The patient will be further assessed for BPPV as indicated and treatment to follow.    Time  6    Period  Weeks    Status  Achieved             Plan - 03/11/20 1213    Clinical Impression Statement  The patient has met 4 LTGs.  PT modified HEP slightly today.  Plan to check progression next visit and discharge if able.  Patient feels she is improving and is now able to be more active.    PT Treatment/Interventions  ADLs/Self Care Home Management;Gait training;Stair training;Functional mobility training;Therapeutic activities;Therapeutic exercise;Balance training;Neuromuscular re-education;Patient/family education;Canalith Repostioning;Vestibular    PT Next Visit Plan  Add gaze stabiization ther ex  to HEP (reviewed today and forgot to provide handout).  Plan to discharge if HEP going well.  Postural and LE strengthening, turning in place.    PT Home Exercise Plan  Access Code: [email or web address]    Consulted and Agree with Plan of Care  Patient       Patient will benefit from skilled therapeutic intervention in order to improve the following deficits and impairments:     Visit Diagnosis: Muscle weakness (generalized)  Other abnormalities of gait and mobility  Other symptoms and signs involving the musculoskeletal system     Problem List Patient Active Problem List   Diagnosis Date Noted  . Venous stasis 03/03/2020  . Left flank pain 02/11/2020  . Primary osteoarthritis of left knee 01/15/2019  . 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  . Balance problem 10/11/2018  . Heart murmur 10/11/2018  . Pain of right sacroiliac joint 04/04/2018  . Primary osteoarthritis of right knee 03/12/2018  . Anxiety state 03/12/2018  . Chest pain syndrome 03/12/2018  . Degenerative arthritis of left knee 03/08/2018  . Irritable bowel syndrome with both constipation and  diarrhea 12/26/2017  . Primary localized osteoarthritis of right hip 08/31/2016  . Arthritis of right hip 08/31/2016  . Constipation, chronic 03/10/2016  . Elevated alkaline phosphatase level 11/24/2015  . Primary osteoarthritis of both first carpometacarpal joints 09/22/2015  . Right shoulder pain 02/23/2015  . Chronic pain syndrome 02/06/2015  . Bilateral carpal tunnel syndrome 01/19/2015  . Dyssomnia 06/15/2014  . Left shoulder pain 06/15/2014  . Dry mouth 06/15/2014  . DDD (degenerative disc disease), lumbar 06/15/2014  . Insomnia 04/30/2014  . Actinic porokeratosis 02/27/2014  . ANA positive 01/24/2014  . History of arthroscopy of left shoulder 01/17/2014  . Strain of gastrocnemius tendon of right lower extremity 12/16/2013  . Osteoarthritis of both knees 11/11/2013  . Lumbar degenerative disc disease 11/11/2013  . Osteoarthritis, hip, bilateral 10/14/2013  . Lupus (systemic lupus erythematosus) ([place]) 05/29/2013  . Chronic venous insufficiency 12/14/2012  . Hyperlipemia 06/02/2011  . Atrophic vaginitis 06/01/2011  . Essential hypertension, benign 05/09/2011  . GERD (gastroesophageal reflux disease) 05/09/2011  . Varicose veins 05/09/2011  . Urinary incontinence 03/25/2011    Khali Perella, PT 03/11/2020, 12:15 PM  [facility] [place], [place], [address] Phone: [phone]   Fax:  [phone]  Name: Kayleanna Lorman MRN: [id number] Date of Birth: [birth year]

## 2020-03-13 ENCOUNTER — Encounter: Payer: Medicare Other | Admitting: Physical Therapy

## 2020-03-17 ENCOUNTER — Ambulatory Visit (INDEPENDENT_AMBULATORY_CARE_PROVIDER_SITE_OTHER): Payer: Medicare Other | Admitting: Physical Therapy

## 2020-03-17 ENCOUNTER — Encounter: Payer: Self-pay | Admitting: Physical Therapy

## 2020-03-17 ENCOUNTER — Other Ambulatory Visit: Payer: Self-pay

## 2020-03-17 DIAGNOSIS — R29898 Other symptoms and signs involving the musculoskeletal system: Secondary | ICD-10-CM

## 2020-03-17 DIAGNOSIS — R2689 Other abnormalities of gait and mobility: Secondary | ICD-10-CM | POA: Diagnosis not present

## 2020-03-17 DIAGNOSIS — M6281 Muscle weakness (generalized): Secondary | ICD-10-CM

## 2020-03-17 NOTE — Therapy (Addendum)
[place]atherine Nixon, [place], [address] Phone: [phone]   Fax:  [phone]  Physical Therapy Treatment and Discharge Summary  Patient Details  Name: Katherine Nixon MRN: [id number] Date of Birth: May 01, 1946 Referring Provider (PT): Beatrice Lecher, MD   Encounter Date: 03/17/2020  PT End of Session - 03/17/20 1106    Visit Number  6    Number of Visits  12    Date for PT Re-Evaluation  04/06/20    PT Start Time  1104    PT Stop Time  1148    PT Time Calculation (min)  44 min    Equipment Utilized During Treatment  Gait belt    Behavior During Therapy  [facility] for tasks assessed/performed       Past Medical History:  Diagnosis Date  . Anxiety   . Basal cell carcinoma 01/2018   "upper lip"  . Chronic diarrhea   . Complication of anesthesia 1989   diff to wake up with gallbaldder surgery- no problems since (03/12/2018)  . Connective tissue disorder ([place])    "lupus"  . Depression   . Dysrhythmia     states at times, last occurence was january 2019 feels like heart fluttering , last a couple seconds, denies any associated cardiac symtpoms. rpeorts at her last annual check up her pcp Metheney  heard it on pysical  exam and ordred an ECHO , per patietn no one reached out to her to set it up   . Heart murmur   . High cholesterol   . History of blood transfusion 1970   "related to childbirth"  . Hypertension   . IBS (irritable bowel syndrome)   . Migraine    "none since ~ 1980" (03/12/2018)  . Neuromuscular disorder (HCC)    lupus  . Osteoarthritis    "all my joints" (03/12/2018)  . Squamous cell carcinoma    "right arm"    Past Surgical History:  Procedure Laterality Date  . BASAL CELL CARCINOMA EXCISION  01/2018   "upper lip"  . BREAST BIOPSY Left     neg  . BREAST CYST ASPIRATION Right   . CATARACT EXTRACTION W/ INTRAOCULAR LENS  IMPLANT, BILATERAL    . CHOLECYSTECTOMY OPEN  1989  .  COLONOSCOPY    . DILATION AND CURETTAGE OF UTERUS    . JOINT REPLACEMENT    . KNEE ARTHROSCOPY Bilateral   . NASAL SINUS SURGERY     "severe infection"  . SHOULDER ARTHROSCOPY Left 12/2014   Dr. Tamera Punt   . SQUAMOUS CELL CARCINOMA EXCISION Right    arm  . TOTAL HIP ARTHROPLASTY Right 08/31/2016   Procedure: TOTAL HIP ARTHROPLASTY ANTERIOR APPROACH;  Surgeon: Frederik Pear, MD;  Location: [facility];  Service: Orthopedics;  Laterality: Right;  . TOTAL KNEE ARTHROPLASTY Right 03/12/2018   Procedure: TOTAL KNEE ARTHROPLASTY;  Surgeon: Frederik Pear, MD;  Location: [place];  Service: Orthopedics;  Laterality: Right;  . TOTAL KNEE ARTHROPLASTY    . TOTAL KNEE ARTHROPLASTY Left 01/15/2019   Procedure: TOTAL KNEE ARTHROPLASTY;  Surgeon: Melrose Nakayama, MD;  [facility];  Service: Orthopedics;  Laterality: Left;  Marland Kitchen VARICOSE VEIN SURGERY Bilateral     There were no vitals filed for this visit.  Subjective Assessment - 03/17/20 1107    Subjective  She reports doing "better" with balance. She is managing walking in narrow spaces better, as long as she is not making sudden moves; turning 360 is no longer  difficult.    Pertinent History  L knee TKA 2020, R knee TKA 2019, R THA 2018, h/o shoulder surgery L side,  notes leg length discrepancy (L shorter), h/o low back pain, hypertension, lupus    Currently in Pain?  No/denies   reports all over pain in joints when waking.        [facility] PT Assessment - 03/17/20 0001      Assessment   Medical Diagnosis  Imbalance    Referring Provider (PT)  Beatrice Lecher, MD    Onset Date/Surgical Date  02/11/20    Hand Dominance  Right    Prior Therapy  prior therapy for musculoskeletal issues      Transfers   Five time sit to stand comments   14.65        [facility] Adult PT Treatment/Exercise - 03/17/20 0001      Lumbar Exercises: Aerobic   UBE (Upper Arm Bike)  2 min forward, 1 min backward; L2      Shoulder Exercises: Stretch   Other Shoulder  Stretches  doorway stretch, low and mid, bilat, 15 seconds, 1 rep each; high single arm door stretch    Other Shoulder Stretches  modified down dog at wall, 15 sec, 2 reps      Balance Exercises - 03/17/20 1131      Balance Exercises: Standing   Standing Eyes Closed  Narrow base of support (BOS);Solid surface;2 reps;20 secs    Wall Bumps  Shoulder;Hip;Eyes opened;10 reps    Stepping Strategy  Anterior;Posterior;Lateral;Foam/compliant surface;5 reps    Tandem Gait  3 reps;Forward;Retro    Partial Tandem Stance  Eyes open;1 rep;25 secs   horiz head turns. No UE support   Retro Gait  2 reps    Turning  Right;Left   1 rep each direction    Sit to/from stand x 5;    Verbally reviewed current HEP and instructed pt on modifications to make certain balance exercises more challenging. Pt reminded to complete balance exercises near surface for UE support if needed. Pt verbalized understanding.     PT Long Term Goals - 03/17/20 1202      PT LONG TERM GOAL #1   Title  The patient will be indep with HEP for LE strengthening, posture, high level balance.    Time  6    Period  Weeks      PT LONG TERM GOAL #2   Title  The patient will improve 5 time sit<>stand from 20.83 seconds to < or equal to 16 seconds.    Baseline  14.65 seconds    Time  6    Period  Weeks    Status  Achieved      PT LONG TERM GOAL #3   Title  The patient will improve Berg balance score from 47/56 to > or equal to 52/56.    Baseline  52/56    Time  6    Period  Weeks    Status  Achieved      PT LONG TERM GOAL #4   Title  The patient will improve gait speed from 2.23 ft/sec to > or equal to 2.8 ft/sec indicating improved community gait.    Baseline  3.67 ft/sec    Time  6    Period  Weeks    Status  Achieved      PT LONG TERM GOAL #5   Title  The patient will be further assessed for BPPV as indicated and treatment to  follow.    Time  6    Period  Weeks    Status  Achieved         Plan - 03/17/20 1155     Clinical Impression Statement  Pt's balance has improved significantly over therapy treatments; she has met most LTG's, HEP on-going. Pt managing exercises well at home, verbal updates given to increase difficulty. Pt had complained of bilat shoulder difficulties during session (unable to raise hands behind head without leaning head forward); pt voiced disinterest in pursuing additional therapy sessions for this.     Pt verbalized readiness to hold therapy while she continues HEP. Spoke with supervising PT; pt moving to 30 day therapy hold (unless vertigo reoccurance).    Personal Factors and Comorbidities  Comorbidity 1;Comorbidity 2;Comorbidity 3+    Comorbidities  lupus, h/o low back pain, h/o multiple total joint replacements    Examination-Activity Limitations  Locomotion Level    Examination-Participation Restrictions  Community Activity    Stability/Clinical Decision Making  Stable/Uncomplicated    Rehab Potential  Good    PT Frequency  2x / week    PT Duration  6 weeks    PT Treatment/Interventions  ADLs/Self Care Home Management;Gait training;Stair training;Functional mobility training;Therapeutic activities;Therapeutic exercise;Balance training;Neuromuscular re-education;Patient/family education;Canalith Repostioning;Vestibular    PT Next Visit Plan  30 day (04/16/20)  therapy hold unless vertigo reoccurance    PT Home Exercise Plan  Access Code: [email or web address]    Consulted and Agree with Plan of Care  Patient       Patient will benefit from skilled therapeutic intervention in order to improve the following deficits and impairments:  Abnormal gait, Decreased activity tolerance, Decreased balance, Decreased strength, Decreased mobility, Postural dysfunction, Impaired sensation, Impaired flexibility, Dizziness  Visit Diagnosis: Muscle weakness (generalized)  Other abnormalities of gait and mobility  Other symptoms and signs involving the musculoskeletal system   PHYSICAL THERAPY  DISCHARGE SUMMARY  Visits from Start of Care: 6  Current functional level related to goals / functional outcomes: See goals above.   Remaining deficits: Patient was continuing to work on high level balance activities via HEP.   Education / Equipment: Home program, home safety.  Plan: Patient agrees to discharge.  Patient goals were partially met. Patient is being discharged due to meeting the stated rehab goals.  ?????        Problem List Patient Active Problem List   Diagnosis Date Noted  . Venous stasis 03/03/2020  . Left flank pain 02/11/2020  . Primary osteoarthritis of left knee 01/15/2019  . 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  . Balance problem 10/11/2018  . Heart murmur 10/11/2018  . Pain of right sacroiliac joint 04/04/2018  . Primary osteoarthritis of right knee 03/12/2018  . Anxiety state 03/12/2018  . Chest pain syndrome 03/12/2018  . Degenerative arthritis of left knee 03/08/2018  . Irritable bowel syndrome with both constipation and diarrhea 12/26/2017  . Primary localized osteoarthritis of right hip 08/31/2016  . Arthritis of right hip 08/31/2016  . Constipation, chronic 03/10/2016  . Elevated alkaline phosphatase level 11/24/2015  . Primary osteoarthritis of both first carpometacarpal joints 09/22/2015  . Right shoulder pain 02/23/2015  . Chronic pain syndrome 02/06/2015  . Bilateral carpal tunnel syndrome 01/19/2015  . Dyssomnia 06/15/2014  . Left shoulder pain 06/15/2014  . Dry mouth 06/15/2014  . DDD (degenerative disc disease), lumbar 06/15/2014  . Insomnia 04/30/2014  . Actinic porokeratosis 02/27/2014  . ANA positive 01/24/2014  . History of arthroscopy of left  shoulder 01/17/2014  . Strain of gastrocnemius tendon of right lower extremity 12/16/2013  . Osteoarthritis of both knees 11/11/2013  . Lumbar degenerative disc disease 11/11/2013  . Osteoarthritis, hip, bilateral 10/14/2013  . Lupus (systemic lupus erythematosus) ([place]) 05/29/2013   . Chronic venous insufficiency 12/14/2012  . Hyperlipemia 06/02/2011  . Atrophic vaginitis 06/01/2011  . Essential hypertension, benign 05/09/2011  . GERD (gastroesophageal reflux disease) 05/09/2011  . Varicose veins 05/09/2011  . Urinary incontinence 03/25/2011    This entire session was performed under direct supervision and direction of a licensed Physical Brewing technologist. I have personally read, edited and approved of the note as written.  Kerin Perna, PTA 03/17/20 4:20 PM;  Ronaldo Miyamoto, SPTA 03/17/20 12:05 PM   Thank you for the referral of this patient. Rudell Cobb, MPT   [facility] [place], [place], [address] Phone: [phone]   Fax:  [phone]  Name: Domino Holten MRN: [id number] Date of Birth: May 13, 1946

## 2020-03-20 ENCOUNTER — Other Ambulatory Visit: Payer: Self-pay | Admitting: Family Medicine

## 2020-03-27 ENCOUNTER — Other Ambulatory Visit: Payer: Self-pay | Admitting: Sports Medicine

## 2020-04-10 ENCOUNTER — Other Ambulatory Visit: Payer: Self-pay | Admitting: Family Medicine

## 2020-04-25 ENCOUNTER — Other Ambulatory Visit: Payer: Self-pay | Admitting: Family Medicine

## 2020-07-14 DIAGNOSIS — Z23 Encounter for immunization: Secondary | ICD-10-CM | POA: Diagnosis not present

## 2020-07-16 ENCOUNTER — Other Ambulatory Visit: Payer: Self-pay

## 2020-07-16 ENCOUNTER — Ambulatory Visit (INDEPENDENT_AMBULATORY_CARE_PROVIDER_SITE_OTHER): Payer: Medicare Other

## 2020-07-16 ENCOUNTER — Encounter: Payer: Self-pay | Admitting: Family Medicine

## 2020-07-16 ENCOUNTER — Ambulatory Visit (INDEPENDENT_AMBULATORY_CARE_PROVIDER_SITE_OTHER): Payer: Medicare Other | Admitting: Family Medicine

## 2020-07-16 VITALS — BP 145/41 | HR 65 | Ht 65.0 in | Wt 186.0 lb

## 2020-07-16 DIAGNOSIS — W010XXA Fall on same level from slipping, tripping and stumbling without subsequent striking against object, initial encounter: Secondary | ICD-10-CM

## 2020-07-16 DIAGNOSIS — S161XXA Strain of muscle, fascia and tendon at neck level, initial encounter: Secondary | ICD-10-CM | POA: Diagnosis not present

## 2020-07-16 DIAGNOSIS — S0990XA Unspecified injury of head, initial encounter: Secondary | ICD-10-CM

## 2020-07-16 DIAGNOSIS — M79642 Pain in left hand: Secondary | ICD-10-CM

## 2020-07-16 DIAGNOSIS — S6992XA Unspecified injury of left wrist, hand and finger(s), initial encounter: Secondary | ICD-10-CM

## 2020-07-16 DIAGNOSIS — S62631A Displaced fracture of distal phalanx of left index finger, initial encounter for closed fracture: Secondary | ICD-10-CM | POA: Diagnosis not present

## 2020-07-16 NOTE — Progress Notes (Addendum)
Acute Office Visit  Subjective:    Patient ID: Katherine Nixon, female    DOB: 1946/08/13, 74 y.o.   MRN: [id number]  Chief Complaint  Patient presents with  . Head Injury    HPI Patient is in today for head injury without LOC.  She said she was helping to put down a rug in her bedroom yesterday that they had just gotten and there were unrolling that she was walking around the corner when her toe caught underneath and she actually fell forward she said that her right eye and face hit the floor and her left hand actually flexed forward and caught underneath her.  She says that the swelling is actually better than it was yesterday but still quite swollen especially over the hand and the face.  She said she did have blurry vision in that right eye for about an hour but it did not it did gradually improve she did not vomit.  She is really not taken any medication specifically for pain relief.  He is very worried about head injury and getting worse over the next couple of days.  She has not had any neurologic symptoms such as speech change or disorientation.  She has had a mild headache.  Past Medical History:  Diagnosis Date  . Anxiety   . Basal cell carcinoma 01/2018   "upper lip"  . Chronic diarrhea   . Complication of anesthesia 1989   diff to wake up with gallbaldder surgery- no problems since (03/12/2018)  . Connective tissue disorder ([place])    "lupus"  . Depression   . Dysrhythmia     states at times, last occurence was january 2019 feels like heart fluttering , last a couple seconds, denies any associated cardiac symtpoms. rpeorts at her last annual check up her pcp Halcyon Heck  heard it on pysical  exam and ordred an ECHO , per patietn no one reached out to her to set it up   . Heart murmur   . High cholesterol   . History of blood transfusion 1970   "related to childbirth"  . Hypertension   . IBS (irritable bowel syndrome)   . Migraine    "none since ~ 1980" (03/12/2018)  .  Neuromuscular disorder (HCC)    lupus  . Osteoarthritis    "all my joints" (03/12/2018)  . Squamous cell carcinoma    "right arm"    Past Surgical History:  Procedure Laterality Date  . BASAL CELL CARCINOMA EXCISION  01/2018   "upper lip"  . BREAST BIOPSY Left     neg  . BREAST CYST ASPIRATION Right   . CATARACT EXTRACTION W/ INTRAOCULAR LENS  IMPLANT, BILATERAL    . CHOLECYSTECTOMY OPEN  1989  . COLONOSCOPY    . DILATION AND CURETTAGE OF UTERUS    . JOINT REPLACEMENT    . KNEE ARTHROSCOPY Bilateral   . NASAL SINUS SURGERY     "severe infection"  . SHOULDER ARTHROSCOPY Left 12/2014   Dr. Tamera Punt   . SQUAMOUS CELL CARCINOMA EXCISION Right    arm  . TOTAL HIP ARTHROPLASTY Right 08/31/2016   Procedure: TOTAL HIP ARTHROPLASTY ANTERIOR APPROACH;  Surgeon: Frederik Pear, MD;  Location: [facility];  Service: Orthopedics;  Laterality: Right;  . TOTAL KNEE ARTHROPLASTY Right 03/12/2018   Procedure: TOTAL KNEE ARTHROPLASTY;  Surgeon: Frederik Pear, MD;  Location: [facility];  Service: Orthopedics;  Laterality: Right;  . TOTAL KNEE ARTHROPLASTY    . TOTAL KNEE ARTHROPLASTY Left 01/15/2019  Procedure: TOTAL KNEE ARTHROPLASTY;  Surgeon: Melrose Nakayama, MD;  Location: [facility];  Service: Orthopedics;  Laterality: Left;  Marland Kitchen VARICOSE VEIN SURGERY Bilateral     Family History  Problem Relation Age of Onset  . Heart disease Mother   . Heart attack Mother   . Hyperlipidemia Father   . Other Father        amputaiton  . Hyperlipidemia Brother   . Hypertension Brother     Social History   Socioeconomic History  . Marital status: Married    Spouse name: Jimmie  . Number of children: 4  . Years of education: 11  . Highest education level: Some college, no degree  Occupational History  . 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 Smoking status: Former Smoker    Packs/day: 1.00    Years: 5.00    Pack years: 5.00    Types: Cigarettes    Quit date: 1989     Years since quitting: 32.6  . Smokeless tobacco: Never Used  Vaping Use  . Vaping Use: Never used  Substance and Sexual Activity  . Alcohol use: No  . Drug use: No  . Sexual activity: Not Currently    Birth control/protection: Abstinence  Other Topics Concern  . Not on file  Social History Narrative   Caffeine- 3-4 cups a day   Chores around the house   Does crafting   Baking and selling at [employer] Strain:   . Difficulty of Paying Living Expenses: Not on file  Food Insecurity:   . Worried About Charity fundraiser in the Last Year: Not on file  . Ran Out of Food in the Last Year: Not on file  Transportation Needs:   . Lack of Transportation (Medical): Not on file  . Lack of Transportation (Non-Medical): Not on file  Physical Activity:   . Days of Exercise per Week: Not on file  . Minutes of Exercise per Session: Not on file  Stress:   . Feeling of Stress : Not on file  Social Connections:   . Frequency of Communication with Friends and Family: Not on file  . Frequency of Social Gatherings with Friends and Family: Not on file  . Attends Religious Services: Not on file  . Active Member of Clubs or Organizations: Not on file  . Attends Archivist Meetings: Not on file  . Marital Status: Not on file  Intimate Partner Violence:   . Fear of Current or Ex-Partner: Not on file  . Emotionally Abused: Not on file  . Physically Abused: Not on file  . Sexually Abused: Not on file    Outpatient Medications Prior to Visit  Medication Sig Dispense Refill  . amLODipine (NORVASC) 5 MG tablet TAKE 1 TABLET DAILY 90 tablet 3  . atorvastatin (LIPITOR) 20 MG tablet TAKE 1 TABLET DAILY AT 6 P.M. 90 tablet 3  . DULoxetine (CYMBALTA) 60 MG capsule TAKE 1 CAPSULE DAILY 90 capsule 3  . hydrochlorothiazide (HYDRODIURIL) 25 MG tablet TAKE 1 TABLET DAILY (Patient taking differently: Take 12.5 mg by mouth daily at 6 PM. ) 90  tablet 4  . linaclotide (LINZESS) 72 MCG capsule Take 1 capsule (72 mcg total) by mouth daily. (Patient taking differently: Take 72 mcg by mouth daily at 6 PM. ) 90 capsule 3  . Multiple Vitamin (MULTIVITAMIN WITH MINERALS) TABS tablet Take 1 tablet by  mouth daily.     Marland Kitchen tiZANidine (ZANAFLEX) 2 MG tablet Take 2 mg by mouth 2 (two) times daily as needed for muscle spasms.     . traZODone (DESYREL) 100 MG tablet TAKE 1 TABLET AT BEDTIME 90 tablet 3   No facility-administered medications prior to visit.    Allergies  Allergen Reactions  . Gabapentin Other (See Comments)    Hallucinations   . Tramadol Other (See Comments)    Hallucinations   . Tylenol [Acetaminophen] Other (See Comments)    Due to liver enzyme issue  . Pravastatin Other (See Comments)    Muscle cramping  . Sulfamethoxazole-Trimethoprim Other (See Comments)    Thrush in mouth    Review of Systems     Objective:    Physical Exam Vitals reviewed.  Constitutional:      Appearance: She is well-developed.  HENT:     Head: Normocephalic and atraumatic.  Eyes:     Conjunctiva/sclera: Conjunctivae normal.  Cardiovascular:     Rate and Rhythm: Normal rate.  Pulmonary:     Effort: Pulmonary effort is normal.  Musculoskeletal:     Comments: Neck with normal flexion and extension.  Normal rotation she had pain laterally with side bending right and left.  She is also mildly tender over the lower cervical spine.  Her left hand shows some bruising over the dorsum of the wrist and the dorsum of the hand.  She is very tender over the fifth metacarpal bone.  She also has bruising over the proximal ends of almost all of her fingers.  On her index finger the DIP is flexed just slightly and she is unable to completely extend it.  She is also tender over that index DIP joint as well.  Skin:    General: Skin is dry.     Coloration: Skin is not pale.     Comments: Significant bruising around her right eye no significant lid edema.   Extraocular movements are intact.  I do not see any tear or break in the skin she is extremely tender over the medial side of the eyebrow arch.  Also has a small bruise and some erythema on the left knee where she had a knee replacement.  Old surgical scar well-healed.  Neurological:     Mental Status: She is alert and oriented to person, place, and time.  Psychiatric:        Behavior: Behavior normal.     BP (!) 145/41   Pulse 65   Ht 5\' 5"  (1.651 m)   Wt 186 lb (84.4 kg)   SpO2 96%   BMI 30.95 kg/m  Wt Readings from Last 3 Encounters:  07/16/20 186 lb (84.4 kg)  03/03/20 186 lb (84.4 kg)  02/11/20 187 lb (84.8 kg)    Health Maintenance Due  Topic Date Due  . COVID-19 Vaccine (1) Never done    There are no preventive care reminders to display for this patient.   Lab Results  Component Value Date   TSH 0.89 07/13/2017   Lab Results  Component Value Date   WBC 6.3 02/11/2020   HGB 12.5 02/11/2020   HCT 38.1 02/11/2020   MCV 93.6 02/11/2020   PLT 204 02/11/2020   Lab Results  Component Value Date   NA 141 02/11/2020   K 4.9 02/11/2020   CO2 29 02/11/2020   GLUCOSE 96 02/11/2020   BUN 17 02/11/2020   CREATININE 1.05 (H) 02/11/2020   BILITOT 0.5 02/11/2020  ALKPHOS 137 (A) 10/24/2016   AST 39 (H) 02/11/2020   ALT 31 (H) 02/11/2020   PROT 7.4 02/11/2020   ALBUMIN 3.9 07/11/2016   CALCIUM 10.4 02/11/2020   ANIONGAP 8 01/15/2019   Lab Results  Component Value Date   CHOL 166 02/11/2020   Lab Results  Component Value Date   HDL 65 02/11/2020   Lab Results  Component Value Date   LDLCALC 85 02/11/2020   Lab Results  Component Value Date   TRIG 70 02/11/2020   Lab Results  Component Value Date   CHOLHDL 2.6 02/11/2020   Lab Results  Component Value Date   HGBA1C 5.6 03/12/2018       Assessment & Plan:   Problem List Items Addressed This Visit    None    Visit Diagnoses    Injury of head, initial encounter    -  Primary   Relevant  Orders   CT Head Wo Contrast (Completed)   Left hand pain       Relevant Orders   DG Hand Complete Left (Completed)   Hand injury, left, initial encounter       Relevant Orders   DG Hand Complete Left (Completed)   Strain of neck muscle, initial encounter       Fall on same level from tripping          Head injury-we will get CT without contrast to look for acute bleed or facial fracture around the right eye.  Hopefully everything looks reassuring I think it will be.  Recommend continue to ice and use Tylenol as needed.  Left hand pain.  The hand and wrist were forcefully flexed forward.  Evaluate for possible fracture particularly of the fifth metacarpal bone in addition I do think she has injured the extensor tendon of the index finger making it difficult for her to completely extend the DIP joint.  I gave her a finger splint to put on and showed her how to tape it on when she gets home after the x-ray.  We will likely need to have her come back in to see one of our sports med docs.  Again ice and Tylenol as needed.  Cervical strain-I did not get an x-ray at this point even though she has a little bit of tenderness over the lower cervical spine today I think this is more of a strain work on gentle stretching and ice and/or heat.  But if not improving consider x-ray  Extensor tendon injury to distal phalanx of the index finger the left hand-unfortunately x-ray did also reveal a fracture making this injury more complicated.  Did to place her in a splint.  We will have her wear that 24/7 and go ahead and place referral to orthopedics hopefully they can get her in early next week.  Okay to use Tylenol and/or ibuprofen for pain and swelling ice as needed.   No orders of the defined types were placed in this encounter.  Time spent 32 min in encounter.   Beatrice Lecher, MD

## 2020-07-17 ENCOUNTER — Other Ambulatory Visit: Payer: Self-pay | Admitting: Family Medicine

## 2020-07-17 DIAGNOSIS — S62631A Displaced fracture of distal phalanx of left index finger, initial encounter for closed fracture: Secondary | ICD-10-CM | POA: Insufficient documentation

## 2020-07-20 ENCOUNTER — Encounter: Payer: Self-pay | Admitting: Family Medicine

## 2020-07-22 DIAGNOSIS — S60222A Contusion of left hand, initial encounter: Secondary | ICD-10-CM | POA: Diagnosis not present

## 2020-07-22 DIAGNOSIS — W19XXXA Unspecified fall, initial encounter: Secondary | ICD-10-CM | POA: Diagnosis not present

## 2020-07-22 DIAGNOSIS — S6992XA Unspecified injury of left wrist, hand and finger(s), initial encounter: Secondary | ICD-10-CM | POA: Diagnosis not present

## 2020-07-22 NOTE — Telephone Encounter (Signed)
I faxed the ov notes and xray yesterday - CF

## 2020-09-03 ENCOUNTER — Ambulatory Visit: Payer: Medicare Other | Admitting: Family Medicine

## 2021-02-10 ENCOUNTER — Ambulatory Visit (INDEPENDENT_AMBULATORY_CARE_PROVIDER_SITE_OTHER): Payer: Medicare Other | Admitting: Family Medicine

## 2021-02-10 DIAGNOSIS — Z0000 Encounter for general adult medical examination without abnormal findings: Secondary | ICD-10-CM | POA: Diagnosis not present

## 2021-02-10 DIAGNOSIS — Z1231 Encounter for screening mammogram for malignant neoplasm of breast: Secondary | ICD-10-CM | POA: Diagnosis not present

## 2021-02-10 NOTE — Patient Instructions (Addendum)
[place] Maintenance Summary and Written Plan of Care  Ms. Katherine Nixon ,  Thank you for allowing me to perform your Medicare Annual Wellness Visit and for your ongoing commitment to your health.   Health Maintenance & Immunization History Health Maintenance  Topic Date Due  . INFLUENZA VACCINE  02/18/2021 (Originally 06/21/2020)  . COVID-19 Vaccine (3 - Booster for Pfizer series) 02/26/2021 (Originally 02/01/2021)  . MAMMOGRAM  01/14/2022  . COLONOSCOPY (Pts 45-13yrs Insurance coverage will need to be confirmed)  04/30/2022  . TETANUS/TDAP  12/29/2027  . Hepatitis C Screening  Completed  . PNA vac Low Risk Adult  Completed  . HPV VACCINES  Aged Out   Immunization History  Administered Date(s) Administered  . Influenza Split 09/01/2011  . Influenza, High Dose Seasonal PF 07/13/2017, 10/10/2018  . Influenza,inj,Quad PF,6+ Mos 12/30/2014, 09/23/2015  . Influenza-Unspecified 09/01/2011, 12/30/2014, 09/23/2015, 08/18/2016  . PFIZER(Purple Top)SARS-COV-2 Vaccination 07/14/2020, 08/04/2020  . Pneumococcal Conjugate-13 02/10/2015  . Pneumococcal Polysaccharide-23 06/22/2012  . Tdap 12/28/2017  . Zoster 05/26/2014    These are the patient goals that we discussed: Goals Addressed              This Visit's Progress   .  Patient Stated (pt-stated)        02/10/2021 AWV Goal: Improved Nutrition/Diet  . Patient will verbalize understanding that diet plays an important role in overall health and that a poor diet is a risk factor for many chronic medical conditions.  . Over the next year, patient will improve self management of their diet by incorporating by more water. . Patient will utilize available community resources to help with food acquisition if needed (ex: food pantries, [address], etc) . Patient will work with nutrition specialist if a referral was made         This is a list of Health Maintenance Items that are overdue or due now: Screening  mammography  Shingrix - patient refused at this time Covid booster- Patient refused at this time.  Orders/Referrals Placed Today: Orders Placed This Encounter  Procedures  . Mammogram 3D SCREEN BREAST BILATERAL    Standing Status:   Future    Standing Expiration Date:   02/10/2022    Scheduling Instructions:     Please call patient to schedule    Order Specific Question:   Reason for Exam (SYMPTOM  OR DIAGNOSIS REQUIRED)    Answer:   breast cancer screen    Order Specific Question:   Preferred imaging location?    Answer:   [facility]   (Contact our referral department at [phone] if you have not spoken with someone about your referral appointment within the next 5 days)    Follow-up Plan . Follow-up with Hali Marry, MD as planned . Mammogram referral has been sent and they will call you to schedule. . Medicare wellness in one year.         Health Maintenance, Female Adopting a healthy lifestyle and getting preventive care are important in promoting health and wellness. Ask your health care provider about:  The right schedule for you to have regular tests and exams.  Things you can do on your own to prevent diseases and keep yourself healthy. What should I know about diet, weight, and exercise? Eat a healthy diet  Eat a diet that includes plenty of vegetables, fruits, low-fat dairy products, and lean protein.  Do not eat a lot of foods that are high in solid fats, added sugars,  or sodium.   Maintain a healthy weight Body mass index (BMI) is used to identify weight problems. It estimates body fat based on height and weight. Your health care provider can help determine your BMI and help you achieve or maintain a healthy weight. Get regular exercise Get regular exercise. This is one of the most important things you can do for your health. Most adults should:  Exercise for at least 150 minutes each week. The exercise should increase your heart  rate and make you sweat (moderate-intensity exercise).  Do strengthening exercises at least twice a week. This is in addition to the moderate-intensity exercise.  Spend less time sitting. Even light physical activity can be beneficial. Watch cholesterol and blood lipids Have your blood tested for lipids and cholesterol at 75 years of age, then have this test every 5 years. Have your cholesterol levels checked more often if:  Your lipid or cholesterol levels are high.  You are older than 75 years of age.  You are at high risk for heart disease. What should I know about cancer screening? Depending on your health history and family history, you may need to have cancer screening at various ages. This may include screening for:  Breast cancer.  Cervical cancer.  Colorectal cancer.  Skin cancer.  Lung cancer. What should I know about heart disease, diabetes, and high blood pressure? Blood pressure and heart disease  High blood pressure causes heart disease and increases the risk of stroke. This is more likely to develop in people who have high blood pressure readings, are of African descent, or are overweight.  Have your blood pressure checked: ? Every 3-5 years if you are 44-34 years of age. ? Every year if you are 38 years old or older. Diabetes Have regular diabetes screenings. This checks your fasting blood sugar level. Have the screening done:  Once every three years after age 65 if you are at a normal weight and have a low risk for diabetes.  More often and at a younger age if you are overweight or have a high risk for diabetes. What should I know about preventing infection? Hepatitis B If you have a higher risk for hepatitis B, you should be screened for this virus. Talk with your health care provider to find out if you are at risk for hepatitis B infection. Hepatitis C Testing is recommended for:  Everyone born from [birth year] through 1965.  Anyone with known risk factors  for hepatitis C. Sexually transmitted infections (STIs)  Get screened for STIs, including gonorrhea and chlamydia, if: ? You are sexually active and are younger than 75 years of age. ? You are older than 75 years of age and your health care provider tells you that you are at risk for this type of infection. ? Your sexual activity has changed since you were last screened, and you are at increased risk for chlamydia or gonorrhea. Ask your health care provider if you are at risk.  Ask your health care provider about whether you are at high risk for HIV. Your health care provider may recommend a prescription medicine to help prevent HIV infection. If you choose to take medicine to prevent HIV, you should first get tested for HIV. You should then be tested every 3 months for as long as you are taking the medicine. Pregnancy  If you are about to stop having your period (premenopausal) and you may become pregnant, seek counseling before you get pregnant.  Take 400 to 800  micrograms (mcg) of folic acid every day if you become pregnant.  Ask for birth control (contraception) if you want to prevent pregnancy. Osteoporosis and menopause Osteoporosis is a disease in which the bones lose minerals and strength with aging. This can result in bone fractures. If you are 66 years old or older, or if you are at risk for osteoporosis and fractures, ask your health care provider if you should:  Be screened for bone loss.  Take a calcium or vitamin D supplement to lower your risk of fractures.  Be given hormone replacement therapy (HRT) to treat symptoms of menopause. Follow these instructions at home: Lifestyle  Do not use any products that contain nicotine or tobacco, such as cigarettes, e-cigarettes, and chewing tobacco. If you need help quitting, ask your health care provider.  Do not use street drugs.  Do not share needles.  Ask your health care provider for help if you need support or information  about quitting drugs. Alcohol use  Do not drink alcohol if: ? Your health care provider tells you not to drink. ? You are pregnant, may be pregnant, or are planning to become pregnant.  If you drink alcohol: ? Limit how much you use to 0-1 drink a day. ? Limit intake if you are breastfeeding.  Be aware of how much alcohol is in your drink. In the [place] one drink equals one 12 oz bottle of beer (355 mL), one 5 oz glass of wine (148 mL), or one 1 oz glass of hard liquor (44 mL). General instructions  Schedule regular health, dental, and eye exams.  Stay current with your vaccines.  Tell your health care provider if: ? You often feel depressed. ? You have ever been abused or do not feel safe at home. Summary  Adopting a healthy lifestyle and getting preventive care are important in promoting health and wellness.  Follow your health care provider's instructions about healthy diet, exercising, and getting tested or screened for diseases.  Follow your health care provider's instructions on monitoring your cholesterol and blood pressure. This information is not intended to replace advice given to you by your health care provider. Make sure you discuss any questions you have with your health care provider. Document Revised: 10/31/2018 Document Reviewed: 10/31/2018 Elsevier Patient Education  2021 [place] A mammogram is a low energy X-ray of the breasts that is done to check for abnormal changes. This procedure can screen for and detect any changes that may indicate breast cancer. Mammograms are regularly done on women. A man may have a mammogram if he has a lump or swelling in his breast. A mammogram can also identify other changes and variations in the breast, such as:  Inflammation of the breast tissue (mastitis).  An infected area that contains a collection of pus (abscess).  A fluid-filled sac (cyst).  Fibrocystic changes. This is when breast tissue becomes  denser, which can make the tissue feel rope-like or uneven under the skin.  Tumors that are not cancerous (benign). Tell a health care provider:  About any allergies you have.  If you have breast implants.  If you have had previous breast disease, biopsy, or surgery.  If you are breastfeeding.  If you are younger than age 20.  If you have a family history of breast cancer.  Whether you are pregnant or may be pregnant. What are the risks? Generally, this is a safe procedure. However, problems may occur, including:  Exposure to radiation. Radiation  levels are very low with this test.  The results being misinterpreted.  The need for further tests.  The inability of the mammogram to detect certain cancers. What happens before the procedure?  Schedule your test about 1-2 weeks after your menstrual period if you are still menstruating. This is usually when your breasts are the least tender.  If you have had a mammogram done at a different facility in the past, get the mammogram X-rays or have them sent to your current exam facility. The new and old images will be compared.  Wash your breasts and underarms on the day of the test.  Do not wear deodorants, perfumes, lotions, or powders anywhere on your body on the day of the test.  Remove any jewelry from your neck.  Wear clothes that you can change into and out of easily. What happens during the procedure?  You will undress from the waist up and put on a gown that opens in the front.  You will stand in front of the X-ray machine.  Each breast will be placed between two plastic or glass plates. The plates will compress your breast for a few seconds. Try to stay as relaxed as possible during the procedure. This does not cause any harm to your breasts and any discomfort you feel will be very brief.  X-rays will be taken from different angles of each breast. The procedure may vary among health care providers and hospitals.    What happens after the procedure?  The mammogram will be examined by a specialist (radiologist).  You may need to repeat certain parts of the test, depending on the quality of the images. This is commonly done if the radiologist needs a better view of the breast tissue.  You may resume your normal activities.  It is up to you to get the results of your procedure. Ask your health care provider, or the department that is doing the procedure, when your results will be ready. Summary  A mammogram is a low energy X-ray of the breasts that is done to check for abnormal changes. A man may have a mammogram if he has a lump or swelling in his breast.  If you have had a mammogram done at a different facility in the past, get the mammogram X-rays or have them sent to your current exam facility in order to compare them.  Schedule your test about 1-2 weeks after your menstrual period if you are still menstruating.  For this test, each breast will be placed between two plastic or glass plates. The plates will compress your breast for a few seconds.  Ask when your test results will be ready. Make sure you get your test results. This information is not intended to replace advice given to you by your health care provider. Make sure you discuss any questions you have with your health care provider. Document Revised: 06/28/2018 Document Reviewed: 06/28/2018 Elsevier Patient Education  [place].

## 2021-02-10 NOTE — Progress Notes (Signed)
MEDICARE ANNUAL WELLNESS VISIT  02/10/2021  Telephone Visit Disclaimer This Medicare AWV was conducted by telephone due to national recommendations for restrictions regarding the COVID-19 Pandemic (e.g. social distancing).  I verified, using two identifiers, that I am speaking with Katherine Nixon or their authorized healthcare agent. I discussed the limitations, risks, security, and privacy concerns of performing an evaluation and management service by telephone and the potential availability of an in-person appointment in the future. The patient expressed understanding and agreed to proceed.  Location of Patient: Home Location of Provider (nurse):  In the office.  Subjective:    Katherine Nixon is a 75 y.o. female patient of Metheney, Rene Kocher, MD who had a Medicare Annual Wellness Visit today via telephone. Katherine Nixon is Retired and lives with their spouse. she has 4 children. she reports that she is socially active and does interact with friends/family regularly. she is minimally physically active and enjoys crafts and baking.  Patient Care Team: Hali Marry, MD as PCP - General (Family Medicine) Anne Shutter, MD as Physician Assistant (Rheumatology) Milinda Cave, FNP as Nurse Practitioner (Rheumatology)  Advanced Directives 02/10/2021 02/24/2020 01/08/2020 01/15/2019 01/07/2019 03/12/2018 03/12/2018  Does Patient Have a Medical Advance Directive? Yes Yes Yes Yes Yes Yes No  Type of Paramedic of [place];Living will [place];Living will [place];Living will Living will Living will [place];Living will -  Does patient want to make changes to medical advance directive? No - Patient declined - No - Patient declined No - Patient declined No - Patient declined No - Patient declined -  Copy of [place] in Chart? No - copy requested - Yes - validated most recent copy scanned  in chart (See row information) - - Yes -  Would patient like information on creating a medical advance directive? - - - - - - No - Patient declined    Hospital Utilization Over the Past 12 Months: # of hospitalizations or ER visits: 0 # of surgeries: 0  Review of Systems    Patient reports that her overall health is unchanged compared to last year.  History obtained from chart review and the patient  Patient Reported Readings (BP, Pulse, CBG, Weight, etc) none  Pain Assessment Pain : No/denies pain     Current Medications & Allergies (verified) Allergies as of 02/10/2021      Reactions   Gabapentin Other (See Comments)   Hallucinations   Tramadol Other (See Comments)   Hallucinations   Tylenol [acetaminophen] Other (See Comments)   Due to liver enzyme issue   Pravastatin Other (See Comments)   Muscle cramping   Sulfamethoxazole-trimethoprim Other (See Comments)   Thrush in mouth      Medication List       Accurate as of February 10, 2021  9:19 AM. If you have any questions, ask your nurse or doctor.        amLODipine 5 MG tablet Commonly known as: NORVASC TAKE 1 TABLET DAILY   atorvastatin 20 MG tablet Commonly known as: LIPITOR TAKE 1 TABLET DAILY AT 6 P.M.   DULoxetine 60 MG capsule Commonly known as: CYMBALTA TAKE 1 CAPSULE DAILY   hydrochlorothiazide 25 MG tablet Commonly known as: HYDRODIURIL TAKE 1 TABLET DAILY What changed:   how much to take  when to take this   linaclotide 72 MCG capsule Commonly known as: LINZESS Take 1 capsule (72 mcg total) by mouth daily. What  changed:   when to take this  additional instructions   multivitamin with minerals Tabs tablet Take 1 tablet by mouth daily.   tiZANidine 2 MG tablet Commonly known as: ZANAFLEX Take 2 mg by mouth 2 (two) times daily as needed for muscle spasms.   traZODone 100 MG tablet Commonly known as: DESYREL TAKE 1 TABLET AT BEDTIME       History (reviewed): Past Medical  History:  Diagnosis Date  . Anxiety   . Basal cell carcinoma 01/2018   "upper lip"  . Chronic diarrhea   . Complication of anesthesia 1989   diff to wake up with gallbaldder surgery- no problems since (03/12/2018)  . Connective tissue disorder ([place])    "lupus"  . Depression   . Dysrhythmia     states at times, last occurence was january 2019 feels like heart fluttering , last a couple seconds, denies any associated cardiac symtpoms. rpeorts at her last annual check up her pcp Metheney  heard it on pysical  exam and ordred an ECHO , per patietn no one reached out to her to set it up   . Heart murmur   . High cholesterol   . History of blood transfusion 1970   "related to childbirth"  . Hypertension   . IBS (irritable bowel syndrome)   . Migraine    "none since ~ 1980" (03/12/2018)  . Neuromuscular disorder (HCC)    lupus  . Osteoarthritis    "all my joints" (03/12/2018)  . Squamous cell carcinoma    "right arm"   Past Surgical History:  Procedure Laterality Date  . BASAL CELL CARCINOMA EXCISION  01/2018   "upper lip"  . BREAST BIOPSY Left     neg  . BREAST CYST ASPIRATION Right   . CATARACT EXTRACTION W/ INTRAOCULAR LENS  IMPLANT, BILATERAL    . CHOLECYSTECTOMY OPEN  1989  . COLONOSCOPY    . DILATION AND CURETTAGE OF UTERUS    . JOINT REPLACEMENT    . KNEE ARTHROSCOPY Bilateral   . NASAL SINUS SURGERY     "severe infection"  . SHOULDER ARTHROSCOPY Left 12/2014   Dr. Tamera Punt   . SQUAMOUS CELL CARCINOMA EXCISION Right    arm  . TOTAL HIP ARTHROPLASTY Right 08/31/2016   Procedure: TOTAL HIP ARTHROPLASTY ANTERIOR APPROACH;  Surgeon: Frederik Pear, MD;  Location: [facility];  Service: Orthopedics;  Laterality: Right;  . TOTAL KNEE ARTHROPLASTY Right 03/12/2018   Procedure: TOTAL KNEE ARTHROPLASTY;  Surgeon: Frederik Pear, MD;  Location: [place];  Service: Orthopedics;  Laterality: Right;  . TOTAL KNEE ARTHROPLASTY    . TOTAL KNEE ARTHROPLASTY Left 01/15/2019   Procedure: TOTAL KNEE  ARTHROPLASTY;  Surgeon: Melrose Nakayama, MD;  Location: [facility];  Service: Orthopedics;  Laterality: Left;  Marland Kitchen VARICOSE VEIN SURGERY Bilateral    Family History  Problem Relation Age of Onset  . Heart disease Mother   . Heart attack Mother   . Hyperlipidemia Father   . Other Father        amputaiton  . Hyperlipidemia Brother   . Hypertension Brother    Social History   Socioeconomic History  . Marital status: Married    Spouse name: Jimmie  . Number of children: 4  . Years of education: 59  . Highest education level: Some college, no degree  Occupational History  . 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 Smoking status: Former Smoker    Packs/day: 1.00  Years: 5.00    Pack years: 5.00    Types: Cigarettes    Quit date: 71    Years since quitting: 33.2  . Smokeless tobacco: Never Used  Vaping Use  . Vaping Use: Never used  Substance and Sexual Activity  . Alcohol use: No  . Drug use: No  . Sexual activity: Not Currently    Birth control/protection: Abstinence  Other Topics Concern  . Not on file  Social History Narrative   Caffeine- 3-4 cups a day   Chores around the house   Does crafting   Baking and selling at [employer] Strain: Low Risk   . Difficulty of Paying Living Expenses: Not hard at all  Food Insecurity: No Food Insecurity  . Worried About Charity fundraiser in the Last Year: Never true  . Ran Out of Food in the Last Year: Never true  Transportation Needs: No Transportation Needs  . Lack of Transportation (Medical): No  . Lack of Transportation (Non-Medical): No  Physical Activity: Inactive  . Days of Exercise per Week: 0 days  . Minutes of Exercise per Session: 0 min  Stress: No Stress Concern Present  . Feeling of Stress : Not at all  Social Connections: Moderately Integrated  . Frequency of Communication with Friends and Family: Three times a  week  . Frequency of Social Gatherings with Friends and Family: Never  . Attends Religious Services: More than 4 times per year  . Active Member of Clubs or Organizations: No  . Attends Archivist Meetings: Never  . Marital Status: Married    Activities of Daily Living In your present state of health, do you have any difficulty performing the following activities: 02/10/2021  Hearing? N  Vision? N  Difficulty concentrating or making decisions? Y  Comment she states "difficulty recalling things sometimes"  Walking or climbing stairs? Y  Comment both are difficult due to her hip and knee replacements.  Dressing or bathing? N  Doing errands, shopping? N  Preparing Food and eating ? N  Using the Toilet? N  In the past six months, have you accidently leaked urine? Y  Comment "feels that she waits too long to go"  Do you have problems with loss of bowel control? Y  Comment has a history of chronic diarrhea and when that happens she has noticed that she will have an accident.  Managing your Medications? N  Managing your Finances? N  Housekeeping or managing your Housekeeping? N  Some recent data might be hidden    Patient Education/ Literacy How often do you need to have someone help you when you read instructions, pamphlets, or other written materials from your doctor or pharmacy?: 1 - Never What is the last grade level you completed in school?: Associates Degree  Exercise Current Exercise Habits: The patient does not participate in regular exercise at present, Exercise limited by: orthopedic condition(s)  Diet Patient reports consuming 3 meals a day and 0 snack(s) a day Patient reports that her primary diet is: Regular Patient reports that she does have regular access to food.   Depression Screen PHQ 2/9 Scores 02/10/2021 01/08/2020 04/10/2019 06/04/2018 07/13/2017 07/11/2016 06/12/2015  PHQ - 2 Score 0 0 0 0 0 0 0  PHQ- 9 Score - - - 1 - - -     Fall Risk Fall Risk   02/10/2021 01/08/2020 04/10/2019 07/13/2017 07/18/2016  Falls in the past year?  0 0 0 No No  Comment - - - - Emmi Telephone Survey: data to providers prior to load  Number falls in past yr: 0 - 0 - -  Injury with Fall? 0 - 0 - -  Risk for fall due to : No Fall Risks Impaired balance/gait - - -  Follow up Falls evaluation completed Falls prevention discussed Falls prevention discussed - -     Objective:  Katherine Nixon seemed alert and oriented and she participated appropriately during our telephone visit.  Blood Pressure Weight BMI  BP Readings from Last 3 Encounters:  07/16/20 (!) 145/41  03/03/20 139/63  02/11/20 (!) 128/48   Wt Readings from Last 3 Encounters:  07/16/20 186 lb (84.4 kg)  03/03/20 186 lb (84.4 kg)  02/11/20 187 lb (84.8 kg)   BMI Readings from Last 1 Encounters:  07/16/20 30.95 kg/m    *Unable to obtain current vital signs, weight, and BMI due to telephone visit type  Hearing/Vision  . Katherine Nixon did not seem to have difficulty with hearing/understanding during the telephone conversation . Reports that she has not had a formal eye exam by an eye care professional within the past year . Reports that she has not had a formal hearing evaluation within the past year *Unable to fully assess hearing and vision during telephone visit type  Cognitive Function: 6CIT Screen 02/10/2021 01/08/2020  What Year? 0 points 0 points  What month? 0 points 0 points  What time? 0 points 0 points  Count back from 20 0 points 0 points  Months in reverse 0 points 0 points  Repeat phrase 0 points 0 points  Total Score 0 0   (Normal:0-7, Significant for Dysfunction: >8)  Normal Cognitive Function Screening: Yes   Immunization & Health Maintenance Record Immunization History  Administered Date(s) Administered  . Influenza Split 09/01/2011  . Influenza, High Dose Seasonal PF 07/13/2017, 10/10/2018  . Influenza,inj,Quad PF,6+ Mos 12/30/2014, 09/23/2015  . Influenza-Unspecified  09/01/2011, 12/30/2014, 09/23/2015, 08/18/2016  . PFIZER(Purple Top)SARS-COV-2 Vaccination 07/14/2020, 08/04/2020  . Pneumococcal Conjugate-13 02/10/2015  . Pneumococcal Polysaccharide-23 06/22/2012  . Tdap 12/28/2017  . Zoster 05/26/2014    Health Maintenance  Topic Date Due  . INFLUENZA VACCINE  02/18/2021 (Originally 06/21/2020)  . COVID-19 Vaccine (3 - Booster for Pfizer series) 02/26/2021 (Originally 02/01/2021)  . MAMMOGRAM  01/14/2022  . COLONOSCOPY (Pts 45-64yrs Insurance coverage will need to be confirmed)  04/30/2022  . TETANUS/TDAP  12/29/2027  . Hepatitis C Screening  Completed  . PNA vac Low Risk Adult  Completed  . HPV VACCINES  Aged Out       Assessment  This is a routine wellness examination for Katherine Nixon.  Health Maintenance: Due or Overdue There are no preventive care reminders to display for this patient.  Katherine Nixon does not need a referral for Community Assistance: Care Management:   no Social Work:    no Prescription Assistance:  no Nutrition/Diabetes Education:  no   Plan:  Personalized Goals Goals Addressed              This Visit's Progress   .  Patient Stated (pt-stated)        02/10/2021 AWV Goal: Improved Nutrition/Diet  . Patient will verbalize understanding that diet plays an important role in overall health and that a poor diet is a risk factor for many chronic medical conditions.  . Over the next year, patient will improve self management of their diet by incorporating by  more water. . Patient will utilize available community resources to help with food acquisition if needed (ex: food pantries, [address], etc) . Patient will work with nutrition specialist if a referral was made       Personalized Health Maintenance & Screening Recommendations  Screening mammography  Shingrix - patient refused at this time. Covid booster- Patient refused at this time.  Lung Cancer Screening Recommended: no (Low Dose CT Chest  recommended if Age 61-80 years, 30 pack-year currently smoking OR have quit w/in past 15 years) Hepatitis C Screening recommended: no HIV Screening recommended: no  Advanced Directives: Written information was not prepared per patient's request.  Referrals & Orders No orders of the defined types were placed in this encounter.   Follow-up Plan . Follow-up with Hali Marry, MD as planned . Mammogram referral has been sent and they will call you to schedule. . Medicare wellness in one year.   I have personally reviewed and noted the following in the patient's chart:   . Medical and social history . Use of alcohol, tobacco or illicit drugs  . Current medications and supplements . Functional ability and status . Nutritional status . Physical activity . Advanced directives . List of other physicians . Hospitalizations, surgeries, and ER visits in previous 12 months . Vitals . Screenings to include cognitive, depression, and falls . Referrals and appointments  In addition, I have reviewed and discussed with Katherine Nixon certain preventive protocols, quality metrics, and best practice recommendations. A written personalized care plan for preventive services as well as general preventive health recommendations is available and can be mailed to the patient at her request.      Katherine Nixon  02/10/2021

## 2021-02-11 ENCOUNTER — Telehealth: Payer: Self-pay

## 2021-02-11 ENCOUNTER — Ambulatory Visit (INDEPENDENT_AMBULATORY_CARE_PROVIDER_SITE_OTHER): Payer: Medicare Other

## 2021-02-11 ENCOUNTER — Ambulatory Visit: Payer: Medicare Other | Admitting: Family Medicine

## 2021-02-11 ENCOUNTER — Other Ambulatory Visit: Payer: Self-pay

## 2021-02-11 DIAGNOSIS — Z1231 Encounter for screening mammogram for malignant neoplasm of breast: Secondary | ICD-10-CM | POA: Diagnosis not present

## 2021-02-11 DIAGNOSIS — Z0000 Encounter for general adult medical examination without abnormal findings: Secondary | ICD-10-CM

## 2021-02-11 NOTE — Telephone Encounter (Signed)
Pt dropped off paperwork for a Disability Parking placard forms and would like that we would give her a call when the form is completed. I've placed the form in the message box. TVT

## 2021-02-12 NOTE — Telephone Encounter (Signed)
lvm advising pt that form is up front.

## 2021-02-12 NOTE — Telephone Encounter (Signed)
Form placed in [place] [facility] box, signed.

## 2021-03-15 ENCOUNTER — Other Ambulatory Visit: Payer: Self-pay | Admitting: Family Medicine

## 2021-03-22 ENCOUNTER — Other Ambulatory Visit: Payer: Self-pay | Admitting: Sports Medicine

## 2021-03-22 NOTE — Telephone Encounter (Signed)
To PCP

## 2021-04-05 ENCOUNTER — Other Ambulatory Visit: Payer: Self-pay | Admitting: Family Medicine

## 2021-04-05 NOTE — Telephone Encounter (Signed)
LVM asking that pt call to schedule a f/u for bp.

## 2021-04-21 ENCOUNTER — Other Ambulatory Visit: Payer: Self-pay | Admitting: Family Medicine

## 2021-04-23 NOTE — Telephone Encounter (Signed)
Please advise pt that she will need a f/u for medication refill on trazadone that she takes for Insomnia. Thanks.

## 2021-04-23 NOTE — Telephone Encounter (Signed)
Patient has been scheduled for 04/27/21. AM

## 2021-04-27 ENCOUNTER — Encounter: Payer: Self-pay | Admitting: Family Medicine

## 2021-04-27 ENCOUNTER — Ambulatory Visit (INDEPENDENT_AMBULATORY_CARE_PROVIDER_SITE_OTHER): Payer: Medicare Other | Admitting: Family Medicine

## 2021-04-27 ENCOUNTER — Other Ambulatory Visit: Payer: Self-pay

## 2021-04-27 VITALS — BP 135/67 | HR 66 | Ht 65.0 in | Wt 191.0 lb

## 2021-04-27 DIAGNOSIS — Z7689 Persons encountering health services in other specified circumstances: Secondary | ICD-10-CM

## 2021-04-27 DIAGNOSIS — I10 Essential (primary) hypertension: Secondary | ICD-10-CM | POA: Diagnosis not present

## 2021-04-27 DIAGNOSIS — G4700 Insomnia, unspecified: Secondary | ICD-10-CM

## 2021-04-27 DIAGNOSIS — R7309 Other abnormal glucose: Secondary | ICD-10-CM | POA: Diagnosis not present

## 2021-04-27 DIAGNOSIS — F411 Generalized anxiety disorder: Secondary | ICD-10-CM | POA: Diagnosis not present

## 2021-04-27 DIAGNOSIS — E785 Hyperlipidemia, unspecified: Secondary | ICD-10-CM | POA: Diagnosis not present

## 2021-04-27 DIAGNOSIS — R748 Abnormal levels of other serum enzymes: Secondary | ICD-10-CM

## 2021-04-27 MED ORDER — TRAZODONE HCL 100 MG PO TABS: 100.0000 mg | ORAL_TABLET | Freq: Every day | ORAL | 1 refills | Status: DC

## 2021-04-27 MED ORDER — HYDROCHLOROTHIAZIDE 12.5 MG PO CAPS: 12.5000 mg | ORAL_CAPSULE | Freq: Every day | ORAL | 3 refills | Status: DC

## 2021-04-27 NOTE — Assessment & Plan Note (Signed)
Doing well.  Will refill trazodone.

## 2021-04-27 NOTE — Progress Notes (Signed)
Established Patient Office Visit  Subjective:  Patient ID: Katherine Nixon, female    DOB: 20-Jun-1946  Age: 75 y.o. MRN: [id number]  CC:  Chief Complaint  Patient presents with  . Medication Refill    Trazadone   . Insomnia    HPI Desa Rech presents for   Hypertension- Pt denies chest pain, SOB, dizziness, or heart palpitations.  Taking meds as directed w/o problems.  Denies medication side effects.    Hyperlipidemia - tolerating stating well with no myalgias or significant side effects.  Lab Results  Component Value Date   CHOL 166 02/11/2020   HDL 65 02/11/2020   LDLCALC 85 02/11/2020   TRIG 70 02/11/2020   CHOLHDL 2.6 02/11/2020   F/U Anxiety - she is doing OK overall.     She does have a f/u with ortho for her right shoulder later this week.    She also really wants to work on weight loss she says she will go on a diet and lose about 10 pounds and then admits to reward herself she will start eating foods that she really should not and then start to gain the weight back she cannot seem to be consistent she does not really have a partner that can be supportive with her currently.  She says her husband also needs to lose weight but he does not really support her.  Past Medical History:  Diagnosis Date  . Anxiety   . Basal cell carcinoma 01/2018   "upper lip"  . Chronic diarrhea   . Complication of anesthesia 1989   diff to wake up with gallbaldder surgery- no problems since (03/12/2018)  . Connective tissue disorder ([place])    "lupus"  . Depression   . Dysrhythmia     states at times, last occurence was january 2019 feels like heart fluttering , last a couple seconds, denies any associated cardiac symtpoms. rpeorts at her last annual check up her pcp Ky Moskowitz  heard it on pysical  exam and ordred an ECHO , per patietn no one reached out to her to set it up   . Heart murmur   . High cholesterol   . History of blood transfusion 1970   "related to  childbirth"  . Hypertension   . IBS (irritable bowel syndrome)   . Migraine    "none since ~ 1980" (03/12/2018)  . Neuromuscular disorder (H[facility])    lupus  . Osteoarthritis    "all my joints" (03/12/2018)  . Squamous cell carcinoma    "right arm"    Past Surgical History:  Procedure Laterality Date  . BASAL CELL CARCINOMA EXCISION  01/2018   "upper lip"  . BREAST BIOPSY Left     neg  . BREAST CYST ASPIRATION Right   . CATARACT EXTRACTION W/ INTRAOCULAR LENS  IMPLANT, BILATERAL    . CHOLECYSTECTOMY OPEN  1989  . COLONOSCOPY    . DILATION AND CURETTAGE OF UTERUS    . JOINT REPLACEMENT    . KNEE ARTHROSCOPY Bilateral   . NASAL SINUS SURGERY     "severe infection"  . SHOULDER ARTHROSCOPY Left 12/2014   Dr. Tamera Punt   . SQUAMOUS CELL CARCINOMA EXCISION Right    arm  . TOTAL HIP ARTHROPLASTY Right 08/31/2016   Procedure: TOTAL HIP ARTHROPLASTY ANTERIOR APPROACH;  Surgeon: Frederik Pear, MD;  Location: [place];  Service: Orthopedics;  Laterality: Right;  . TOTAL KNEE ARTHROPLASTY Right 03/12/2018   Procedure: TOTAL KNEE ARTHROPLASTY;  Surgeon: Frederik Pear,  MD;  Location: [place];  Service: Orthopedics;  Laterality: Right;  . TOTAL KNEE ARTHROPLASTY    . TOTAL KNEE ARTHROPLASTY Left 01/15/2019   Procedure: TOTAL KNEE ARTHROPLASTY;  Surgeon: Melrose Nakayama, MD;  Location: [facility];  Service: Orthopedics;  Laterality: Left;  Marland Kitchen VARICOSE VEIN SURGERY Bilateral     Family History  Problem Relation Age of Onset  . Heart disease Mother   . Heart attack Mother   . Hyperlipidemia Father   . Other Father        amputaiton  . Hyperlipidemia Brother   . Hypertension Brother     Social History   Socioeconomic History  . Marital status: Married    Spouse name: Jimmie  . Number of children: 4  . Years of education: 44  . Highest education level: Some college, no degree  Occupational History  . 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 Smoking status:  Former Smoker    Packs/day: 1.00    Years: 5.00    Pack years: 5.00    Types: Cigarettes    Quit date: 1989    Years since quitting: 33.4  . Smokeless tobacco: Never Used  Vaping Use  . Vaping Use: Never used  Substance and Sexual Activity  . Alcohol use: No  . Drug use: No  . Sexual activity: Not Currently    Birth control/protection: Abstinence  Other Topics Concern  . Not on file  Social History Narrative   Caffeine- 3-4 cups a day   Chores around the house   Does crafting   Baking and selling at [employer] Strain: Low Risk   . Difficulty of Paying Living Expenses: Not hard at all  Food Insecurity: No Food Insecurity  . Worried About Charity fundraiser in the Last Year: Never true  . Ran Out of Food in the Last Year: Never true  Transportation Needs: No Transportation Needs  . Lack of Transportation (Medical): No  . Lack of Transportation (Non-Medical): No  Physical Activity: Inactive  . Days of Exercise per Week: 0 days  . Minutes of Exercise per Session: 0 min  Stress: No Stress Concern Present  . Feeling of Stress : Not at all  Social Connections: Moderately Integrated  . Frequency of Communication with Friends and Family: Three times a week  . Frequency of Social Gatherings with Friends and Family: Never  . Attends Religious Services: More than 4 times per year  . Active Member of Clubs or Organizations: No  . Attends Archivist Meetings: Never  . Marital Status: Married  Human resources officer Violence: Not At Risk  . Fear of Current or Ex-Partner: No  . Emotionally Abused: No  . Physically Abused: No  . Sexually Abused: No    Outpatient Medications Prior to Visit  Medication Sig Dispense Refill  . amLODipine (NORVASC) 5 MG tablet TAKE 1 TABLET DAILY 90 tablet 0  . atorvastatin (LIPITOR) 20 MG tablet TAKE 1 TABLET DAILY AT 6 P.M. 90 tablet 3  . DULoxetine (CYMBALTA) 60 MG capsule TAKE 1  CAPSULE DAILY 90 capsule 1  . Multiple Vitamin (MULTIVITAMIN WITH MINERALS) TABS tablet Take 1 tablet by mouth daily.     . hydrochlorothiazide (HYDRODIURIL) 25 MG tablet TAKE 1 TABLET DAILY (Patient taking differently: Take 12.5 mg by mouth daily at 6 PM.) 90 tablet 4  . traZODone (DESYREL) 100 MG tablet TAKE  1 TABLET AT BEDTIME 90 tablet 3  . linaclotide (LINZESS) 72 MCG capsule Take 1 capsule (72 mcg total) by mouth daily. (Patient taking differently: Take 72 mcg by mouth daily at 6 PM. Takes it as needed) 90 capsule 3   No facility-administered medications prior to visit.    Allergies  Allergen Reactions  . Gabapentin Other (See Comments)    Hallucinations   . Tramadol Other (See Comments)    Hallucinations   . Tylenol [Acetaminophen] Other (See Comments)    Due to liver enzyme issue  . Pravastatin Other (See Comments)    Muscle cramping  . Sulfamethoxazole-Trimethoprim Other (See Comments)    Thrush in mouth    ROS Review of Systems    Objective:    Physical Exam Constitutional:      Appearance: She is well-developed.  HENT:     Head: Normocephalic and atraumatic.  Cardiovascular:     Rate and Rhythm: Normal rate and regular rhythm.     Heart sounds: Normal heart sounds.  Pulmonary:     Effort: Pulmonary effort is normal.     Breath sounds: Normal breath sounds.  Skin:    General: Skin is warm and dry.  Neurological:     Mental Status: She is alert and oriented to person, place, and time.  Psychiatric:        Behavior: Behavior normal.     BP 135/67   Pulse 66   Ht 5\' 5"  (1.651 m)   Wt 191 lb (86.6 kg)   SpO2 100%   BMI 31.78 kg/m  Wt Readings from Last 3 Encounters:  04/27/21 191 lb (86.6 kg)  07/16/20 186 lb (84.4 kg)  03/03/20 186 lb (84.4 kg)     Health Maintenance Due  Topic Date Due  . Zoster Vaccines- Shingrix (1 of 2) Never done  . COVID-19 Vaccine (3 - Booster for Pfizer series) 01/04/2021    There are no preventive care reminders  to display for this patient.  Lab Results  Component Value Date   TSH 0.89 07/13/2017   Lab Results  Component Value Date   WBC 6.3 02/11/2020   HGB 12.5 02/11/2020   HCT 38.1 02/11/2020   MCV 93.6 02/11/2020   PLT 204 02/11/2020   Lab Results  Component Value Date   NA 141 02/11/2020   K 4.9 02/11/2020   CO2 29 02/11/2020   GLUCOSE 96 02/11/2020   BUN 17 02/11/2020   CREATININE 1.05 (H) 02/11/2020   BILITOT 0.5 02/11/2020   ALKPHOS 137 (A) 10/24/2016   AST 39 (H) 02/11/2020   ALT 31 (H) 02/11/2020   PROT 7.4 02/11/2020   ALBUMIN 3.9 07/11/2016   CALCIUM 10.4 02/11/2020   ANIONGAP 8 01/15/2019   Lab Results  Component Value Date   CHOL 166 02/11/2020   Lab Results  Component Value Date   HDL 65 02/11/2020   Lab Results  Component Value Date   LDLCALC 85 02/11/2020   Lab Results  Component Value Date   TRIG 70 02/11/2020   Lab Results  Component Value Date   CHOLHDL 2.6 02/11/2020   Lab Results  Component Value Date   HGBA1C 5.6 03/12/2018      Assessment & Plan:   Problem List Items Addressed This Visit      Cardiovascular and Mediastinum   Essential hypertension, benign - Primary    Well controlled. Continue current regimen. Follow up in  6 mo. Due for refills.  Relevant Medications   hydrochlorothiazide (MICROZIDE) 12.5 MG capsule   Other Relevant Orders   Lipid Panel w/reflex Direct LDL   CBC with Differential   COMPLETE METABOLIC PANEL WITH GFR   Hemoglobin A1c     Other   Insomnia    Doing well.  Will refill trazodone.      Relevant Medications   traZODone (DESYREL) 100 MG tablet   Hyperlipemia   Relevant Medications   hydrochlorothiazide (MICROZIDE) 12.5 MG capsule   Other Relevant Orders   Lipid Panel w/reflex Direct LDL   CBC with Differential   COMPLETE METABOLIC PANEL WITH GFR   Hemoglobin A1c   Anxiety state   Relevant Medications   traZODone (DESYREL) 100 MG tablet    Other Visit Diagnoses    Elevated  liver enzymes       Relevant Orders   COMPLETE METABOLIC PANEL WITH GFR   Abnormal glucose       Relevant Orders   Hemoglobin A1c   Encounter for weight management         Weight management-discussed options including using one of the smart phone apps to count calories.  Considering getting back into weight watchers which she actually did years ago in fact she is a lifetime member.  We also discussed possible referral to nutritionist.  The finding something that allows her some flexibility finding quick meals that are nutritious that do not require a lot of cooking in preparation.  Elevated liver enzymes-due to recheck liver function.  Meds ordered this encounter  Medications  . traZODone (DESYREL) 100 MG tablet    Sig: Take 1 tablet (100 mg total) by mouth at bedtime.    Dispense:  90 tablet    Refill:  1  . hydrochlorothiazide (MICROZIDE) 12.5 MG capsule    Sig: Take 1 capsule (12.5 mg total) by mouth daily.    Dispense:  90 capsule    Refill:  3    Follow-up: Return in about 6 months (around 10/27/2021) for Hypertension, sleep.    Beatrice Lecher, MD

## 2021-04-27 NOTE — Assessment & Plan Note (Signed)
Well controlled. Continue current regimen. Follow up in  6 mo. Due for refills.

## 2021-04-28 LAB — HEMOGLOBIN A1C
Hgb A1c MFr Bld: 5.6 % of total Hgb (ref <5.7)
Mean Plasma Glucose: 114 mg/dL
eAG (mmol/L): 6.3 mmol/L

## 2021-04-28 LAB — COMPLETE METABOLIC PANEL WITH GFR
AG Ratio: 1.5 (calc) (ref 1.0–2.5)
ALT: 15 U/L (ref 6–29)
AST: 24 U/L (ref 10–35)
Albumin: 4.3 g/dL (ref 3.6–5.1)
Alkaline phosphatase (APISO): 80 U/L (ref 37–153)
BUN/Creatinine Ratio: 20 (calc) (ref 6–22)
BUN: 20 mg/dL (ref 7–25)
CO2: 28 mmol/L (ref 20–32)
Calcium: 10.3 mg/dL (ref 8.6–10.4)
Chloride: 106 mmol/L (ref 98–110)
Creat: 0.98 mg/dL — ABNORMAL HIGH (ref 0.60–0.93)
GFR, Est African American: 66 mL/min/{1.73_m2} (ref >=60)
GFR, Est Non African American: 57 mL/min/{1.73_m2} — ABNORMAL LOW (ref >=60)
Globulin: 2.9 g/dL (calc) (ref 1.9–3.7)
Glucose, Bld: 89 mg/dL (ref 65–99)
Potassium: 4.2 mmol/L (ref 3.5–5.3)
Sodium: 141 mmol/L (ref 135–146)
Total Bilirubin: 0.5 mg/dL (ref 0.2–1.2)
Total Protein: 7.2 g/dL (ref 6.1–8.1)

## 2021-04-28 LAB — CBC WITH DIFFERENTIAL/PLATELET
Absolute Monocytes: 464 cells/uL (ref 200–950)
Basophils Absolute: 29 cells/uL (ref 0–200)
Basophils Relative: 0.5 %
Eosinophils Absolute: 151 cells/uL (ref 15–500)
Eosinophils Relative: 2.6 %
HCT: 36.9 % (ref 35.0–45.0)
Hemoglobin: 12 g/dL (ref 11.7–15.5)
Lymphs Abs: 1514 cells/uL (ref 850–3900)
MCH: 31.3 pg (ref 27.0–33.0)
MCHC: 32.5 g/dL (ref 32.0–36.0)
MCV: 96.3 fL (ref 80.0–100.0)
MPV: 10 fL (ref 7.5–12.5)
Monocytes Relative: 8 %
Neutro Abs: 3642 cells/uL (ref 1500–7800)
Neutrophils Relative %: 62.8 %
Platelets: 190 10*3/uL (ref 140–400)
RBC: 3.83 10*6/uL (ref 3.80–5.10)
RDW: 13 % (ref 11.0–15.0)
Total Lymphocyte: 26.1 %
WBC: 5.8 10*3/uL (ref 3.8–10.8)

## 2021-04-28 LAB — LIPID PANEL W/REFLEX DIRECT LDL
Cholesterol: 167 mg/dL (ref <200)
HDL: 72 mg/dL (ref >=50)
LDL Cholesterol (Calc): 81 mg/dL (calc)
Non-HDL Cholesterol (Calc): 95 mg/dL (calc) (ref <130)
Total CHOL/HDL Ratio: 2.3 (calc) (ref <5.0)
Triglycerides: 61 mg/dL (ref <150)

## 2021-04-29 NOTE — Progress Notes (Signed)
All labs are normal. 

## 2021-04-30 DIAGNOSIS — M7501 Adhesive capsulitis of right shoulder: Secondary | ICD-10-CM | POA: Diagnosis not present

## 2021-05-11 DIAGNOSIS — M75121 Complete rotator cuff tear or rupture of right shoulder, not specified as traumatic: Secondary | ICD-10-CM | POA: Diagnosis not present

## 2021-05-11 DIAGNOSIS — M7501 Adhesive capsulitis of right shoulder: Secondary | ICD-10-CM | POA: Diagnosis not present

## 2021-05-24 DIAGNOSIS — Z9660 Presence of unspecified orthopedic joint implant: Secondary | ICD-10-CM | POA: Diagnosis not present

## 2021-05-24 DIAGNOSIS — S6391XA Sprain of unspecified part of right wrist and hand, initial encounter: Secondary | ICD-10-CM | POA: Diagnosis not present

## 2021-05-24 DIAGNOSIS — S0083XA Contusion of other part of head, initial encounter: Secondary | ICD-10-CM | POA: Diagnosis not present

## 2021-05-24 DIAGNOSIS — S0990XA Unspecified injury of head, initial encounter: Secondary | ICD-10-CM | POA: Diagnosis not present

## 2021-05-24 DIAGNOSIS — S6991XA Unspecified injury of right wrist, hand and finger(s), initial encounter: Secondary | ICD-10-CM | POA: Diagnosis not present

## 2021-05-24 DIAGNOSIS — S0993XA Unspecified injury of face, initial encounter: Secondary | ICD-10-CM | POA: Diagnosis not present

## 2021-05-24 DIAGNOSIS — G8911 Acute pain due to trauma: Secondary | ICD-10-CM | POA: Diagnosis not present

## 2021-05-28 DIAGNOSIS — M75121 Complete rotator cuff tear or rupture of right shoulder, not specified as traumatic: Secondary | ICD-10-CM | POA: Diagnosis not present

## 2021-06-03 DIAGNOSIS — M75121 Complete rotator cuff tear or rupture of right shoulder, not specified as traumatic: Secondary | ICD-10-CM | POA: Diagnosis not present

## 2021-06-07 DIAGNOSIS — M75121 Complete rotator cuff tear or rupture of right shoulder, not specified as traumatic: Secondary | ICD-10-CM | POA: Diagnosis not present

## 2021-06-10 DIAGNOSIS — M75121 Complete rotator cuff tear or rupture of right shoulder, not specified as traumatic: Secondary | ICD-10-CM | POA: Diagnosis not present

## 2021-06-14 DIAGNOSIS — M75121 Complete rotator cuff tear or rupture of right shoulder, not specified as traumatic: Secondary | ICD-10-CM | POA: Diagnosis not present

## 2021-06-15 DIAGNOSIS — M75121 Complete rotator cuff tear or rupture of right shoulder, not specified as traumatic: Secondary | ICD-10-CM | POA: Diagnosis not present

## 2021-06-17 DIAGNOSIS — M75121 Complete rotator cuff tear or rupture of right shoulder, not specified as traumatic: Secondary | ICD-10-CM | POA: Diagnosis not present

## 2021-06-21 DIAGNOSIS — M75121 Complete rotator cuff tear or rupture of right shoulder, not specified as traumatic: Secondary | ICD-10-CM | POA: Diagnosis not present

## 2021-06-24 DIAGNOSIS — M75121 Complete rotator cuff tear or rupture of right shoulder, not specified as traumatic: Secondary | ICD-10-CM | POA: Diagnosis not present

## 2021-06-29 DIAGNOSIS — M75121 Complete rotator cuff tear or rupture of right shoulder, not specified as traumatic: Secondary | ICD-10-CM | POA: Diagnosis not present

## 2021-07-02 DIAGNOSIS — M75121 Complete rotator cuff tear or rupture of right shoulder, not specified as traumatic: Secondary | ICD-10-CM | POA: Diagnosis not present

## 2021-07-05 ENCOUNTER — Other Ambulatory Visit: Payer: Self-pay | Admitting: Family Medicine

## 2021-07-05 DIAGNOSIS — M7501 Adhesive capsulitis of right shoulder: Secondary | ICD-10-CM | POA: Diagnosis not present

## 2021-09-20 ENCOUNTER — Other Ambulatory Visit: Payer: Self-pay | Admitting: Family Medicine

## 2021-10-09 ENCOUNTER — Emergency Department (INDEPENDENT_AMBULATORY_CARE_PROVIDER_SITE_OTHER)
Admission: EM | Admit: 2021-10-09 | Discharge: 2021-10-09 | Disposition: A | Discharge status: Home / Self Care | Payer: Medicare Other | Source: Home / Self Care

## 2021-10-09 ENCOUNTER — Other Ambulatory Visit: Payer: Self-pay

## 2021-10-09 DIAGNOSIS — E876 Hypokalemia: Secondary | ICD-10-CM | POA: Diagnosis not present

## 2021-10-09 DIAGNOSIS — R197 Diarrhea, unspecified: Secondary | ICD-10-CM

## 2021-10-09 DIAGNOSIS — E860 Dehydration: Secondary | ICD-10-CM | POA: Diagnosis not present

## 2021-10-09 DIAGNOSIS — Z79899 Other long term (current) drug therapy: Secondary | ICD-10-CM | POA: Diagnosis not present

## 2021-10-09 DIAGNOSIS — M329 Systemic lupus erythematosus, unspecified: Secondary | ICD-10-CM | POA: Diagnosis not present

## 2021-10-09 DIAGNOSIS — E7800 Pure hypercholesterolemia, unspecified: Secondary | ICD-10-CM | POA: Diagnosis not present

## 2021-10-09 DIAGNOSIS — I10 Essential (primary) hypertension: Secondary | ICD-10-CM | POA: Diagnosis not present

## 2021-10-09 DIAGNOSIS — Z881 Allergy status to other antibiotic agents status: Secondary | ICD-10-CM | POA: Diagnosis not present

## 2021-10-09 DIAGNOSIS — Z882 Allergy status to sulfonamides status: Secondary | ICD-10-CM | POA: Diagnosis not present

## 2021-10-09 DIAGNOSIS — Z885 Allergy status to narcotic agent status: Secondary | ICD-10-CM | POA: Diagnosis not present

## 2021-10-09 DIAGNOSIS — Z888 Allergy status to other drugs, medicaments and biological substances status: Secondary | ICD-10-CM | POA: Diagnosis not present

## 2021-10-09 LAB — POCT URINALYSIS DIP (MANUAL ENTRY)
Glucose, UA: NEGATIVE mg/dL
Ketones, POC UA: NEGATIVE mg/dL
Leukocytes, UA: NEGATIVE
Nitrite, UA: NEGATIVE
Protein Ur, POC: 100 mg/dL — AB
Spec Grav, UA: >=1.03 — AB (ref 1.010–1.025)
Urobilinogen, UA: 0.2 E.U./dL
pH, UA: 6 (ref 5.0–8.0)

## 2021-10-09 NOTE — ED Notes (Signed)
Patient is being discharged from the Urgent Care and sent to the Emergency Department via personal vehicle . Per M. Ragan, FNP, patient is in need of higher level of care due to dehydration. Patient is aware and verbalizes understanding of plan of care.  Vitals:   10/09/21 1432  BP: 124/76  Pulse: 91  Resp: 20  Temp: 98.1 F (36.7 C)  SpO2: 95%

## 2021-10-09 NOTE — Discharge Instructions (Addendum)
Advised/instructed patient to go to [facility] ED now for further evaluation of dehydration and ongoing diarrhea.  Advised patient that she will need stat blood work (to rule out septicemia) IV fluid and possible imaging.  Patient agreed and verbalized understanding

## 2021-10-09 NOTE — ED Provider Notes (Signed)
[facility]    CSN: [id number] Arrival date & time: 10/09/21  1409      History   Chief Complaint Chief Complaint  Patient presents with   Nausea   Diarrhea    HPI Katherine Nixon is a 75 y.o. female.   HPI 74 year old female presents with nausea and diarrhea x 6 days.  Denies emesis and reports that she has lost 9 pounds.  PMH significant for chronic diarrhea, IBS, Lupus, and squamous cell carcinoma.  Patient is accompanied by her granddaughter today who will be driving her.  Patient reports history of chronic diarrhea and states" I have never had constant diarrhea this bad for the past 6 days."  Past Medical History:  Diagnosis Date   Anxiety    Basal cell carcinoma 01/2018   "upper lip"   Chronic diarrhea    Complication of anesthesia 1989   diff to wake up with gallbaldder surgery- no problems since (03/12/2018)   Connective tissue disorder ([place])    "lupus"   Depression    Dysrhythmia     states at times, last occurence was january 2019 feels like heart fluttering , last a couple seconds, denies any associated cardiac symtpoms. rpeorts at her last annual check up her pcp Metheney  heard it on pysical  exam and ordred an ECHO , per patietn no one reached out to her to set it up    Heart murmur    High cholesterol    History of blood transfusion 1970   "related to childbirth"   Hypertension    IBS (irritable bowel syndrome)    Migraine    "none since ~ 1980" (03/12/2018)   Neuromuscular disorder ([place])    lupus   Osteoarthritis    "all my joints" (03/12/2018)   Squamous cell carcinoma    "right arm"    Patient Active Problem List   Diagnosis Date Noted   Venous stasis 03/03/2020   Left flank pain 02/11/2020   Primary osteoarthritis of left knee 01/15/2019   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   Balance problem 10/11/2018   Heart murmur 10/11/2018   Pain of right sacroiliac joint 04/04/2018   Primary osteoarthritis of right knee 03/12/2018    Anxiety state 03/12/2018   Chest pain syndrome 03/12/2018   Degenerative arthritis of left knee 03/08/2018   Irritable bowel syndrome with both constipation and diarrhea 12/26/2017   Primary localized osteoarthritis of right hip 08/31/2016   Arthritis of right hip 08/31/2016   Constipation, chronic 03/10/2016   Elevated alkaline phosphatase level 11/24/2015   Primary osteoarthritis of both first carpometacarpal joints 09/22/2015   Right shoulder pain 02/23/2015   Chronic pain syndrome 02/06/2015   Bilateral carpal tunnel syndrome 01/19/2015   Dyssomnia 06/15/2014   Left shoulder pain 06/15/2014   Dry mouth 06/15/2014   DDD (degenerative disc disease), lumbar 06/15/2014   Insomnia 04/30/2014   Actinic porokeratosis 02/27/2014   ANA positive 01/24/2014   History of arthroscopy of left shoulder 01/17/2014   Strain of gastrocnemius tendon of right lower extremity 12/16/2013   Osteoarthritis of both knees 11/11/2013   Lumbar degenerative disc disease 11/11/2013   Osteoarthritis, hip, bilateral 10/14/2013   Lupus (systemic lupus erythematosus) ([place]) 05/29/2013   Chronic venous insufficiency 12/14/2012   Hyperlipemia 06/02/2011   Atrophic vaginitis 06/01/2011   Essential hypertension, benign 05/09/2011   GERD (gastroesophageal reflux disease) 05/09/2011   Varicose veins 05/09/2011   Urinary incontinence 03/25/2011    Past Surgical History:  Procedure  Laterality Date   BASAL CELL CARCINOMA EXCISION  01/2018   "upper lip"   BREAST BIOPSY Left     neg   BREAST CYST ASPIRATION Right    CATARACT EXTRACTION W/ INTRAOCULAR LENS  IMPLANT, BILATERAL     CHOLECYSTECTOMY OPEN  1989   COLONOSCOPY     DILATION AND CURETTAGE OF UTERUS     JOINT REPLACEMENT     KNEE ARTHROSCOPY Bilateral    NASAL SINUS SURGERY     "severe infection"   SHOULDER ARTHROSCOPY Left 12/2014   Dr. Donnamarie Poag CELL CARCINOMA EXCISION Right    arm   TOTAL HIP ARTHROPLASTY Right 08/31/2016   Procedure:  TOTAL HIP ARTHROPLASTY ANTERIOR APPROACH;  Surgeon: Frederik Pear, MD;  Location: [place];  Service: Orthopedics;  Laterality: Right;   TOTAL KNEE ARTHROPLASTY Right 03/12/2018   Procedure: TOTAL KNEE ARTHROPLASTY;  Surgeon: Frederik Pear, MD;  Location: [place];  Service: Orthopedics;  Laterality: Right;   TOTAL KNEE ARTHROPLASTY     TOTAL KNEE ARTHROPLASTY Left 01/15/2019   Procedure: TOTAL KNEE ARTHROPLASTY;  Surgeon: Melrose Nakayama, MD;  Location[facility];  Service: Orthopedics;  Laterality: Left;   VARICOSE VEIN SURGERY Bilateral     OB History   No obstetric history on file.      Home Medications    Prior to Admission medications   Medication Sig Start Date End Date Taking? Authorizing Provider  amLODipine (NORVASC) 5 MG tablet TAKE 1 TABLET DAILY 07/05/21   Hali Marry, MD  atorvastatin (LIPITOR) 20 MG tablet TAKE 1 TABLET DAILY AT 6 P.M. 03/17/21   Hali Marry, MD  DULoxetine (CYMBALTA) 60 MG capsule TAKE 1 CAPSULE DAILY 09/21/21   Hali Marry, MD  hydrochlorothiazide (MICROZIDE) 12.5 MG capsule Take 1 capsule (12.5 mg total) by mouth daily. 04/27/21   Hali Marry, MD  Multiple Vitamin (MULTIVITAMIN WITH MINERALS) TABS tablet Take 1 tablet by mouth daily.     [provider]  traZODone (DESYREL) 100 MG tablet Take 1 tablet (100 mg total) by mouth at bedtime. 04/27/21   Hali Marry, MD    Family History Family History  Problem Relation Age of Onset   Heart disease Mother    Heart attack Mother    Hyperlipidemia Father    Other Father        amputaiton   Hyperlipidemia Brother    Hypertension Brother     Social History Social History   Tobacco Use   Smoking status: Former    Packs/day: 1.00    Years: 5.00    Pack years: 5.00    Types: Cigarettes    Quit date: 1989    Years since quitting: 33.9   Smokeless tobacco: Never  Vaping Use   Vaping Use: Never used  Substance Use Topics   Alcohol use: No   Drug use: No      Allergies   Gabapentin, Tramadol, Tylenol [acetaminophen], Pravastatin, and Sulfamethoxazole-trimethoprim   Review of Systems Review of Systems  Gastrointestinal:  Positive for diarrhea.  All other systems reviewed and are negative.   Physical Exam Triage Vital Signs ED Triage Vitals  Enc Vitals Group     BP 10/09/21 1432 124/76     Pulse Rate 10/09/21 1432 91     Resp 10/09/21 1432 20     Temp 10/09/21 1432 98.1 F (36.7 C)     Temp Source 10/09/21 1432 Oral     SpO2 10/09/21 1432 95 %  Weight 10/09/21 1429 173 lb (78.5 kg)     Height 10/09/21 1429 5\' 5"  (1.651 m)     Head Circumference --      Peak Flow --      Pain Score 10/09/21 1429 0     Pain Loc --      Pain Edu? --      Excl. in [place]? --    No data found.  Updated Vital Signs BP 124/76 (BP Location: Right Arm)   Pulse 91   Temp 98.1 F (36.7 C) (Oral)   Resp 20   Ht 5\' 5"  (1.651 m)   Wt 173 lb (78.5 kg)   SpO2 95%   BMI 28.79 kg/m   Physical Exam Vitals and nursing note reviewed.  Constitutional:      General: She is not in acute distress.    Appearance: Normal appearance. She is normal weight. She is ill-appearing.  HENT:     Head: Normocephalic and atraumatic.     Mouth/Throat:     Mouth: Mucous membranes are moist.     Pharynx: Oropharynx is clear.  Eyes:     Extraocular Movements: Extraocular movements intact.     Conjunctiva/sclera: Conjunctivae normal.     Pupils: Pupils are equal, round, and reactive to light.  Cardiovascular:     Rate and Rhythm: Normal rate and regular rhythm.     Pulses: Normal pulses.     Heart sounds: Normal heart sounds.  Pulmonary:     Effort: Pulmonary effort is normal.     Breath sounds: Normal breath sounds.  Abdominal:     General: There is no distension.     Palpations: Abdomen is soft.     Tenderness: There is no abdominal tenderness. There is no right CVA tenderness, left CVA tenderness, guarding or rebound.     Comments: Hypoactive bowel  sounds x4 quads, mild left lower quadrant and right lower quadrant tenderness noted  Musculoskeletal:     Cervical back: Normal range of motion and neck supple.  Neurological:     Mental Status: She is alert.     UC Treatments / Results  Labs (all labs ordered are listed, but only abnormal results are displayed) Labs Reviewed  POCT URINALYSIS DIP (MANUAL ENTRY) - Abnormal; Notable for the following components:      Result Value   Bilirubin, UA small (*)    Spec Grav, UA >=1.030 (*)    Blood, UA trace-intact (*)    Protein Ur, POC =100 (*)    All other components within normal limits    EKG   Radiology No results found.  Procedures Procedures (including critical care time)  Medications Ordered in UC Medications - No data to display  Initial Impression / Assessment and Plan / UC Course  I have reviewed the triage vital signs and the nursing notes.  Pertinent labs & imaging results that were available during my care of the patient were reviewed by me and considered in my medical decision making (see chart for details).     MDM: 1.  Diarrhea-multiple bouts of diarrhea given this patient's medical history above has been advised to go to [facility] ED now for further evaluation. Advised/instructed patient to go to [facility] ED now for further evaluation of dehydration and ongoing diarrhea.  Advised patient that she will need stat blood work (to rule out septicemia) IV fluid and possible imaging.  Patient agreed and verbalized understanding.  Patient's  granddaughter will be driving her to ED now. Final Clinical Impressions(s) / UC Diagnoses   Final diagnoses:  Dehydration  Diarrhea, unspecified type     Discharge Instructions      Advised/instructed patient to go to [facility] ED now for further evaluation of dehydration and ongoing diarrhea.  Advised patient that she will need  stat blood work (to rule out septicemia) IV fluid and possible imaging.  Patient agreed and verbalized understanding     ED Prescriptions   None    PDMP not reviewed this encounter.   Eliezer Lofts, [place] 10/09/21 1507

## 2021-10-09 NOTE — ED Triage Notes (Signed)
Pt presents to Urgent Care with c/o nausea and diarrhea x 6 days. Denies emesis. States she has lost 9 lbs due to this.

## 2021-10-18 ENCOUNTER — Other Ambulatory Visit: Payer: Self-pay | Admitting: Family Medicine

## 2021-10-18 DIAGNOSIS — G4700 Insomnia, unspecified: Secondary | ICD-10-CM

## 2021-10-27 ENCOUNTER — Ambulatory Visit: Payer: Medicare Other | Admitting: Family Medicine

## 2021-11-09 ENCOUNTER — Other Ambulatory Visit: Payer: Self-pay

## 2021-11-09 ENCOUNTER — Ambulatory Visit (INDEPENDENT_AMBULATORY_CARE_PROVIDER_SITE_OTHER): Payer: Medicare Other

## 2021-11-09 ENCOUNTER — Ambulatory Visit (INDEPENDENT_AMBULATORY_CARE_PROVIDER_SITE_OTHER): Payer: Medicare Other | Admitting: Family Medicine

## 2021-11-09 ENCOUNTER — Encounter: Payer: Self-pay | Admitting: Family Medicine

## 2021-11-09 VITALS — BP 121/51 | HR 57 | Temp 97.7°F | Resp 16 | Ht 65.0 in | Wt 186.0 lb

## 2021-11-09 DIAGNOSIS — K5904 Chronic idiopathic constipation: Secondary | ICD-10-CM | POA: Diagnosis not present

## 2021-11-09 DIAGNOSIS — R109 Unspecified abdominal pain: Secondary | ICD-10-CM

## 2021-11-09 DIAGNOSIS — K582 Mixed irritable bowel syndrome: Secondary | ICD-10-CM

## 2021-11-09 DIAGNOSIS — I10 Essential (primary) hypertension: Secondary | ICD-10-CM

## 2021-11-09 DIAGNOSIS — Z23 Encounter for immunization: Secondary | ICD-10-CM | POA: Diagnosis not present

## 2021-11-09 DIAGNOSIS — M546 Pain in thoracic spine: Secondary | ICD-10-CM

## 2021-11-09 DIAGNOSIS — G4700 Insomnia, unspecified: Secondary | ICD-10-CM | POA: Diagnosis not present

## 2021-11-09 DIAGNOSIS — M2578 Osteophyte, vertebrae: Secondary | ICD-10-CM | POA: Diagnosis not present

## 2021-11-09 LAB — BASIC METABOLIC PANEL WITH GFR
BUN: 20 mg/dL (ref 7–25)
CO2: 29 mmol/L (ref 20–32)
Calcium: 10.4 mg/dL (ref 8.6–10.4)
Chloride: 106 mmol/L (ref 98–110)
Creat: 0.9 mg/dL (ref 0.60–1.00)
Glucose, Bld: 90 mg/dL (ref 65–99)
Potassium: 5 mmol/L (ref 3.5–5.3)
Sodium: 141 mmol/L (ref 135–146)
eGFR: 67 mL/min/{1.73_m2} (ref >=60)

## 2021-11-09 MED ORDER — LINACLOTIDE 72 MCG PO CAPS: 72.0000 ug | ORAL_CAPSULE | Freq: Every day | ORAL | 1 refills | Status: DC

## 2021-11-09 NOTE — Assessment & Plan Note (Signed)
Would like to retry Linzess which I think is perfectly reasonable.  New prescription sent to pharmacy.  She had 2 different doses on the chart so I sent the one that look like it was prescribed last.

## 2021-11-09 NOTE — Assessment & Plan Note (Signed)
left-sided pain, above the hip.  It is really a little bit more lateral than true flank pain.  But at this point it is becoming more frequent and its bothering her almost nightly and is really disrupting her sleep quality so I think we could start with an abdominal ultrasound and maybe a thoracic spine film.  We can move forward with more advanced imaging such as MRI of the spine or CT of the abdomen if needed.

## 2021-11-09 NOTE — Assessment & Plan Note (Signed)
Well controlled. Continue current regimen. Follow up in  6 mo  

## 2021-11-09 NOTE — Progress Notes (Signed)
She is  Established Patient Office Visit  Subjective:  Patient ID: Katherine Nixon, female    DOB: 1946/08/17  Age: 75 y.o. MRN: [id number]  CC:  Chief Complaint  Patient presents with   Hypertension    Follow up     HPI [name] presents for   Hypertension- Pt denies chest pain, SOB, dizziness, or heart palpitations.  Taking meds as directed w/o problems.  Denies medication side effects.    She also was recently seen in the emergency department about 4 weeks ago for diarrhea it was pretty severe and lasted about 6 to 7 days.  She was significantly nauseated with it but did not vomit.  She never noticed any blood in the stool.  Her a couple other family members have been eating what she has been eating and no one else got sick.  The time she got to the ED her potassium was really low and she was very dehydrated they gave her some fluids and potassium and she is of the next day she felt significantly better.  She also wanted to update me she is still having left-sided pain above the iliac crest along the axillary line.  But below the ribs.  She says it happens almost every night she will go to bed and then after she has been asleep for about an hour she will wake up and it is very uncomfortable and continues to hurt on and off for the rest of the night really disrupting her sleep.  She usually will use a heating pad.  And she has been taking some Motrin at bedtime to help as well.  She describes the pain as sharp.  She does have a prior history of significant low back issues and has had prior injections with Dr. Dianah Field.  She also reports that she would like a refill on a GI medication that she was given several years ago by Dr. Darene Lamer as well.  She said she would go multiple days without a bowel movement and then would have diarrhea she just does not remember the name of it but would like to have a new prescription for it.  Past Medical History:  Diagnosis Date   Anxiety     Basal cell carcinoma 01/2018   "upper lip"   Chronic diarrhea    Complication of anesthesia 1989   diff to wake up with gallbaldder surgery- no problems since (03/12/2018)   Connective tissue disorder ([place])    "lupus"   Depression    Dysrhythmia     states at times, last occurence was january 2019 feels like heart fluttering , last a couple seconds, denies any associated cardiac symtpoms. rpeorts at her last annual check up her pcp Normajean Nash  heard it on pysical  exam and ordred an ECHO , per patietn no one reached out to her to set it up    Heart murmur    High cholesterol    History of blood transfusion 1970   "related to childbirth"   Hypertension    IBS (irritable bowel syndrome)    Migraine    "none since ~ 1980" (03/12/2018)   Neuromuscular disorder ([place])    lupus   Osteoarthritis    "all my joints" (03/12/2018)   Squamous cell carcinoma    "right arm"    Past Surgical History:  Procedure Laterality Date   BASAL CELL CARCINOMA EXCISION  01/2018   "upper lip"   BREAST BIOPSY Left     neg  BREAST CYST ASPIRATION Right    CATARACT EXTRACTION W/ INTRAOCULAR LENS  IMPLANT, BILATERAL     CHOLECYSTECTOMY OPEN  1989   COLONOSCOPY     DILATION AND CURETTAGE OF UTERUS     JOINT REPLACEMENT     KNEE ARTHROSCOPY Bilateral    NASAL SINUS SURGERY     "severe infection"   SHOULDER ARTHROSCOPY Left 12/2014   Dr. Donnamarie Poag CELL CARCINOMA EXCISION Right    arm   TOTAL HIP ARTHROPLASTY Right 08/31/2016   Procedure: TOTAL HIP ARTHROPLASTY ANTERIOR APPROACH;  Surgeon: Frederik Pear, MD;  Location: [facility];  Service: Orthopedics;  Laterality: Right;   TOTAL KNEE ARTHROPLASTY Right 03/12/2018   Procedure: TOTAL KNEE ARTHROPLASTY;  Surgeon: Frederik Pear, MD;  Location: [facility];  Service: Orthopedics;  Laterality: Right;   TOTAL KNEE ARTHROPLASTY     TOTAL KNEE ARTHROPLASTY Left 01/15/2019   Procedure: TOTAL KNEE ARTHROPLASTY;  Surgeon: Melrose Nakayama, MD;  Location: [facility];  Service:  Orthopedics;  Laterality: Left;   VARICOSE VEIN SURGERY Bilateral     Family History  Problem Relation Age of Onset   Heart disease Mother    Heart attack Mother    Hyperlipidemia Father    Other Father        amputaiton   Hyperlipidemia Brother    Hypertension Brother     Social History   Socioeconomic History   Marital status: Married    Spouse name: Jimmie   Number of children: 4   Years of education: 15   Highest education level: Some college, no degree  Occupational History   Occupation: retired    Comment: med assist, Geophysicist/field seismologist  Tobacco Use   Smoking status: Former    Packs/day: 1.00    Years: 5.00    Pack years: 5.00    Types: Cigarettes    Quit date: 1989    Years since quitting: 33.9   Smokeless tobacco: Never  Vaping Use   Vaping Use: Never used  Substance and Sexual Activity   Alcohol use: No   Drug use: No   Sexual activity: Not on file  Other Topics Concern   Not on file  Social History Narrative   Caffeine- 3-4 cups a day   Chores around the house   Does Chief Strategy Officer and selling at [place] Strain: Low Risk    Difficulty of Paying Living Expenses: Not hard at all  Food Insecurity: No Food Insecurity   Worried About Charity fundraiser in the Last Year: Never true   Arboriculturist in the Last Year: Never true  Transportation Needs: No Transportation Needs   Lack of Transportation (Medical): No   Lack of Transportation (Non-Medical): No  Physical Activity: Inactive   Days of Exercise per Week: 0 days   Minutes of Exercise per Session: 0 min  Stress: No Stress Concern Present   Feeling of Stress : Not at all  Social Connections: Moderately Integrated   Frequency of Communication with Friends and Family: Three times a week   Frequency of Social Gatherings with Friends and Family: Never   Attends Religious Services: More than 4 times per year   Active Member of [employer]  or Organizations: No   Attends Archivist Meetings: Never   Marital Status: Married  Human resources officer Violence: Not At Risk   Fear of Current or Ex-Partner: No   Emotionally  Abused: No   Physically Abused: No   Sexually Abused: No    Outpatient Medications Prior to Visit  Medication Sig Dispense Refill   amLODipine (NORVASC) 5 MG tablet TAKE 1 TABLET DAILY 90 tablet 3   atorvastatin (LIPITOR) 20 MG tablet TAKE 1 TABLET DAILY AT 6 P.M. 90 tablet 3   DULoxetine (CYMBALTA) 60 MG capsule TAKE 1 CAPSULE DAILY 90 capsule 1   hydrochlorothiazide (MICROZIDE) 12.5 MG capsule Take 1 capsule (12.5 mg total) by mouth daily. 90 capsule 3   Multiple Vitamin (MULTIVITAMIN WITH MINERALS) TABS tablet Take 1 tablet by mouth daily.      Multiple Vitamins-Minerals (HAIR SKIN & NAILS ADVANCED PO) Take by mouth.     traZODone (DESYREL) 100 MG tablet TAKE 1 TABLET AT BEDTIME 90 tablet 3   No facility-administered medications prior to visit.    Allergies  Allergen Reactions   Gabapentin Other (See Comments)    Hallucinations    Tramadol Other (See Comments)    Hallucinations    Tylenol [Acetaminophen] Other (See Comments)    Due to liver enzyme issue   Pravastatin Other (See Comments)    Muscle cramping   Sulfamethoxazole-Trimethoprim Other (See Comments)    Thrush in mouth    ROS Review of Systems    Objective:    Physical Exam Constitutional:      Appearance: Normal appearance. She is well-developed.  HENT:     Head: Normocephalic and atraumatic.  Cardiovascular:     Rate and Rhythm: Normal rate and regular rhythm.     Heart sounds: Normal heart sounds.  Pulmonary:     Effort: Pulmonary effort is normal.     Breath sounds: Normal breath sounds.  Skin:    General: Skin is warm and dry.  Neurological:     Mental Status: She is alert and oriented to person, place, and time.  Psychiatric:        Behavior: Behavior normal.    BP (!) 121/51    Pulse (!) 57    Temp 97.7  F (36.5 C)    Resp 16    Ht 5\' 5"  (1.651 m)    Wt 186 lb (84.4 kg)    PF 98 L/min    BMI 30.95 kg/m  Wt Readings from Last 3 Encounters:  11/09/21 186 lb (84.4 kg)  10/09/21 173 lb (78.5 kg)  04/27/21 191 lb (86.6 kg)     Health Maintenance Due  Topic Date Due   INFLUENZA VACCINE  06/21/2021    There are no preventive care reminders to display for this patient.  Lab Results  Component Value Date   TSH 0.89 07/13/2017   Lab Results  Component Value Date   WBC 5.8 04/27/2021   HGB 12.0 04/27/2021   HCT 36.9 04/27/2021   MCV 96.3 04/27/2021   PLT 190 04/27/2021   Lab Results  Component Value Date   NA 141 04/27/2021   K 4.2 04/27/2021   CO2 28 04/27/2021   GLUCOSE 89 04/27/2021   BUN 20 04/27/2021   CREATININE 0.98 (H) 04/27/2021   BILITOT 0.5 04/27/2021   ALKPHOS 137 (A) 10/24/2016   AST 24 04/27/2021   ALT 15 04/27/2021   PROT 7.2 04/27/2021   ALBUMIN 3.9 07/11/2016   CALCIUM 10.3 04/27/2021   ANIONGAP 8 01/15/2019   Lab Results  Component Value Date   CHOL 167 04/27/2021   Lab Results  Component Value Date   HDL 72 04/27/2021   Lab Results  Component Value Date   LDLCALC 81 04/27/2021   Lab Results  Component Value Date   TRIG 61 04/27/2021   Lab Results  Component Value Date   CHOLHDL 2.3 04/27/2021   Lab Results  Component Value Date   HGBA1C 5.6 04/27/2021      Assessment & Plan:   Problem List Items Addressed This Visit       Cardiovascular and Mediastinum   Essential hypertension, benign - Primary    Well controlled. Continue current regimen. Follow up in  6 mo       Relevant Orders   BASIC METABOLIC PANEL WITH GFR     Digestive   Irritable bowel syndrome with both constipation and diarrhea   Relevant Medications   linaclotide (LINZESS) 72 MCG capsule   Chronic idiopathic constipation    Would like to retry Linzess which I think is perfectly reasonable.  New prescription sent to pharmacy.  She had 2 different doses on the  chart so I sent the one that look like it was prescribed last.      Relevant Medications   linaclotide (LINZESS) 72 MCG capsule     Other   Left flank pain    left-sided pain, above the hip.  It is really a little bit more lateral than true flank pain.  But at this point it is becoming more frequent and its bothering her almost nightly and is really disrupting her sleep quality so I think we could start with an abdominal ultrasound and maybe a thoracic spine film.  We can move forward with more advanced imaging such as MRI of the spine or CT of the abdomen if needed.      Relevant Orders   DG Thoracic Spine 2 View   Insomnia   Other Visit Diagnoses     Left sided abdominal pain       Relevant Orders   US Abdomen Complete       Meds ordered this encounter  Medications   linaclotide (LINZESS) 72 MCG capsule    Sig: Take 1 capsule (72 mcg total) by mouth daily.    Dispense:  90 capsule    Refill:  1    Follow-up: Return in about 6 months (around 05/10/2022) for Hypertension.    Beatrice Lecher, MD

## 2021-11-10 NOTE — Progress Notes (Signed)
Your lab work is within acceptable range and there are no concerning findings.   ?

## 2021-11-10 NOTE — Progress Notes (Signed)
HI Katherine Nixon, Your xray shows some curvature of the spine and thin bones.  There is degenerative discs at several levels taht could be causing some pain.  There is a little scar tissues at the base of the left lung.  We could get you in with sports med and see if they think your pain is coming from your back or we can refer you for physical therapy ans see it it helps.

## 2021-11-13 ENCOUNTER — Telehealth: Payer: Self-pay | Admitting: Family Medicine

## 2021-11-13 DIAGNOSIS — U071 COVID-19: Secondary | ICD-10-CM

## 2021-11-13 MED ORDER — MOLNUPIRAVIR EUA 200MG CAPSULE: 4.0000 | ORAL_CAPSULE | Freq: Two times a day (BID) | ORAL | 0 refills | Status: AC

## 2021-11-13 NOTE — Telephone Encounter (Signed)
Notified by on-call nurse of patient with positive COVID test requesting antiviral therapy. No acute distress and patient aware of signs and symptoms requiring further evaluation. Molnupiravir sent in.   Purcell Nails Olevia Bowens, DNP, FNP-C

## 2021-11-23 ENCOUNTER — Ambulatory Visit (INDEPENDENT_AMBULATORY_CARE_PROVIDER_SITE_OTHER): Payer: Medicare Other | Admitting: Sports Medicine

## 2021-11-23 ENCOUNTER — Ambulatory Visit (INDEPENDENT_AMBULATORY_CARE_PROVIDER_SITE_OTHER): Payer: Medicare Other

## 2021-11-23 ENCOUNTER — Other Ambulatory Visit: Payer: Self-pay

## 2021-11-23 DIAGNOSIS — R109 Unspecified abdominal pain: Secondary | ICD-10-CM

## 2021-11-23 DIAGNOSIS — R1032 Left lower quadrant pain: Secondary | ICD-10-CM | POA: Diagnosis not present

## 2021-11-23 DIAGNOSIS — M5136 Other intervertebral disc degeneration, lumbar region: Secondary | ICD-10-CM

## 2021-11-23 DIAGNOSIS — N281 Cyst of kidney, acquired: Secondary | ICD-10-CM | POA: Diagnosis not present

## 2021-11-23 DIAGNOSIS — M51369 Other intervertebral disc degeneration, lumbar region without mention of lumbar back pain or lower extremity pain: Secondary | ICD-10-CM

## 2021-11-23 MED ORDER — PREDNISONE 50 MG PO TABS: ORAL_TABLET | ORAL | 0 refills | Status: DC

## 2021-11-23 NOTE — Progress Notes (Addendum)
° ° °  Procedures performed today:    None.  Independent interpretation of notes and tests performed by another provider:   None.  Brief History, Exam, Impression, and Recommendations:    DDD (degenerative disc disease), lumbar This is a pleasant 76 year old female, we have been treating her for many years, she has known lumbar DDD, multilevel lumbar spinal stenosis. She had an L3-L4 transforaminal epidural about 6 years ago. Did well until recently, now having recurrence of back pain, left quadratus lumborum, worse with laying in bed at night, better with flexion, no red flag symptoms, she saw her PCP, she has had some conservative treatment without much improvement. Adding prednisone, updated MRI. This will be for epidural planning, if the prednisone does not ease her pain then she would like triazolam anxiolysis prior to her epidurals. Lets plan to see each other again in 4 weeks. For insurance purposes she has failed greater than 6 weeks of physician directed conservative treatment, she has x-rays that were unrevealing, persistent pain.  Update 11/29/2021: Left L4-L5 interlaminar epidural ordered, she also has a very large renal cyst on the left that can be contributing to her symptoms, if the epidural does not help I would like her to follow-up with her PCP regarding the large renal cyst.  Chronic process with exacerbation and pharmacologic intervention  ___________________________________________ Gwen Her. Dianah Field, M.D., ABFM., CAQSM. Primary Care and [place] Instructor of [employer] [facility]

## 2021-11-23 NOTE — Assessment & Plan Note (Addendum)
This is a pleasant 76 year old female, we have been treating her for many years, she has known lumbar DDD, multilevel lumbar spinal stenosis. She had an L3-L4 transforaminal epidural about 6 years ago. Did well until recently, now having recurrence of back pain, left quadratus lumborum, worse with laying in bed at night, better with flexion, no red flag symptoms, she saw her PCP, she has had some conservative treatment without much improvement. Adding prednisone, updated MRI. This will be for epidural planning, if the prednisone does not ease her pain then she would like triazolam anxiolysis prior to her epidurals. Lets plan to see each other again in 4 weeks. For insurance purposes she has failed greater than 6 weeks of physician directed conservative treatment, she has x-rays that were unrevealing, persistent pain.  Update 11/29/2021: Left L4-L5 interlaminar epidural ordered, she also has a very large renal cyst on the left that can be contributing to her symptoms, if the epidural does not help I would like her to follow-up with her PCP regarding the large renal cyst.

## 2021-11-23 NOTE — Progress Notes (Signed)
Hi Katherine Nixon, the pancreas and spleen look good.  No worrisome findings.  They did not get the best view of the pancreas because of overlying bowel but that is not unusual with an ultrasound.  But again nothing jumping out that seems odd.  You do have a cyst on your left kidney.  Similar to what was seen on your prior CT.  It is stable so no change in size.  You do have a little bit of calcification of the aorta present.  This is from deposit of cholesterol onto the blood vessel.  If you are still having a lot of discomfort then we can always move forward with a CT of the abdomen.

## 2021-11-24 ENCOUNTER — Other Ambulatory Visit: Payer: Self-pay | Admitting: Family Medicine

## 2021-11-24 DIAGNOSIS — K5904 Chronic idiopathic constipation: Secondary | ICD-10-CM

## 2021-11-24 MED ORDER — LINACLOTIDE 72 MCG PO CAPS: 72.0000 ug | ORAL_CAPSULE | Freq: Every day | ORAL | 1 refills | Status: DC

## 2021-11-29 ENCOUNTER — Ambulatory Visit (INDEPENDENT_AMBULATORY_CARE_PROVIDER_SITE_OTHER): Payer: Medicare Other

## 2021-11-29 ENCOUNTER — Other Ambulatory Visit: Payer: Self-pay

## 2021-11-29 DIAGNOSIS — M545 Low back pain, unspecified: Secondary | ICD-10-CM

## 2021-11-29 DIAGNOSIS — M48061 Spinal stenosis, lumbar region without neurogenic claudication: Secondary | ICD-10-CM | POA: Diagnosis not present

## 2021-11-29 DIAGNOSIS — M5136 Other intervertebral disc degeneration, lumbar region: Secondary | ICD-10-CM

## 2021-11-29 NOTE — Addendum Note (Signed)
Addended by: Silverio Decamp on: 11/29/2021 04:47 PM   Modules accepted: Orders

## 2021-12-01 ENCOUNTER — Other Ambulatory Visit: Payer: Medicare Other

## 2021-12-02 ENCOUNTER — Ambulatory Visit
Admission: RE | Admit: 2021-12-02 | Discharge: 2021-12-02 | Disposition: A | Discharge status: Home / Self Care | Payer: Medicare Other | Source: Ambulatory Visit | Attending: Sports Medicine | Admitting: Sports Medicine

## 2021-12-02 DIAGNOSIS — M5136 Other intervertebral disc degeneration, lumbar region: Secondary | ICD-10-CM

## 2021-12-02 DIAGNOSIS — M47817 Spondylosis without myelopathy or radiculopathy, lumbosacral region: Secondary | ICD-10-CM | POA: Diagnosis not present

## 2021-12-02 MED ORDER — METHYLPREDNISOLONE ACETATE 40 MG/ML INJ SUSP (RADIOLOG
80.0000 mg | Freq: Once | INTRAMUSCULAR | Status: AC
  Administered 2021-12-02: 80 mg via EPIDURAL

## 2021-12-02 MED ORDER — IOPAMIDOL (ISOVUE-M 200) INJECTION 41%
1.0000 mL | Freq: Once | INTRAMUSCULAR | Status: AC
  Administered 2021-12-02: 1 mL via EPIDURAL

## 2021-12-02 NOTE — Discharge Instructions (Signed)

## 2021-12-21 ENCOUNTER — Ambulatory Visit: Payer: Medicare Other | Admitting: Sports Medicine

## 2022-01-14 ENCOUNTER — Telehealth: Payer: Self-pay

## 2022-01-14 NOTE — Telephone Encounter (Signed)
Patient called stating the Linzess requires a PA.

## 2022-01-19 ENCOUNTER — Telehealth: Payer: Self-pay

## 2022-01-19 NOTE — Telephone Encounter (Signed)
Initiated Prior authorization HID:[id number] ?Via: Covermymeds ?Case/Key:: [id number] ?Status: approved as of 01/19/22 ?Reason:Coverage Start Date:12/20/2021;Coverage End Date:01/19/2023; ?Notified Pt via: Mychart ?

## 2022-01-20 ENCOUNTER — Encounter: Payer: Self-pay | Admitting: Family Medicine

## 2022-01-20 ENCOUNTER — Other Ambulatory Visit: Payer: Self-pay

## 2022-01-20 ENCOUNTER — Ambulatory Visit (INDEPENDENT_AMBULATORY_CARE_PROVIDER_SITE_OTHER): Payer: Medicare Other | Admitting: Family Medicine

## 2022-01-20 VITALS — BP 116/51 | HR 53 | Ht 65.0 in | Wt 184.0 lb

## 2022-01-20 DIAGNOSIS — E876 Hypokalemia: Secondary | ICD-10-CM

## 2022-01-20 DIAGNOSIS — R768 Other specified abnormal immunological findings in serum: Secondary | ICD-10-CM | POA: Diagnosis not present

## 2022-01-20 DIAGNOSIS — R109 Unspecified abdominal pain: Secondary | ICD-10-CM

## 2022-01-20 DIAGNOSIS — M329 Systemic lupus erythematosus, unspecified: Secondary | ICD-10-CM

## 2022-01-20 NOTE — Progress Notes (Signed)
Established Patient Office Visit  Subjective:  Patient ID: Katherine Nixon, female    DOB: May 08, 1946  Age: 76 y.o. MRN: [id number]  CC:  Chief Complaint  Patient presents with   Abdominal Pain    HPI Katherine Nixon presents for of the left flank pain.  It continues to bother her overnight.  About an hour after she tries to lay down to go to sleep she wakes up with that left flank pain.  It never radiates up down or to the side.  Is always in the same place.  It usually bothers her to the point that she finally just has to get up and then sit up and wait until it goes away and then she can usually lay back down.  Does not bother her during the day.  No fevers or chills.  No hematuria.  She describes the pain is more sharp.  She recently had a lumbar injection recommended by Dr. Darene Lamer but that did not really make a difference in her pain.  She has had chronic diarrhea on and off for before years but it has been particularly worse in the last couple of months.  When it really ramped up she had significant fatigue and lethargy for about 4 days but then that passed but the diarrhea has hung around.  She has been taking an over-the-counter daily potassium supplement to help with her hypokalemia.  She did tell me that she was previously diagnosed with lupus and seen at [facility] in 2015.  She did go into remission but she Cannot help wonder if this could be related.  Autoantibody Testing:  Lab Results  Component Value Date  ANA1 1:640 01/24/2014  ANAPATRN1 Homogeneous 01/24/2014  ANA2 1:2560 01/24/2014  ANAPATRN2 Speckled 01/24/2014  SMAB Negative 01/24/2014  RNPAB Negative 01/24/2014  ROAB Negative 01/24/2014  LAAB Negative 01/24/2014  DSDNA 13 06/13/2014  C3 151 06/13/2014  C4 31 06/13/2014  CRDLPNIGG 8 66/29/4765  CRDLPNIGM 20 (H) 46/50/3546  CRDLPNIGA 5 56/81/2751  B2GP1IGG 1 01/24/2014  B2GP1IGM 35 (H) 01/24/2014  B2GP1IGA 3 01/24/2014   Past Medical History:  Diagnosis Date    Anxiety    Basal cell carcinoma 01/2018   "upper lip"   Chronic diarrhea    Complication of anesthesia 1989   diff to wake up with gallbaldder surgery- no problems since (03/12/2018)   Connective tissue disorder ([place])    "lupus"   Depression    Dysrhythmia     states at times, last occurence was january 2019 feels like heart fluttering , last a couple seconds, denies any associated cardiac symtpoms. rpeorts at her last annual check up her pcp Gorge Almanza  heard it on pysical  exam and ordred an ECHO , per patietn no one reached out to her to set it up    Heart murmur    High cholesterol    History of blood transfusion 1970   "related to childbirth"   Hypertension    IBS (irritable bowel syndrome)    Migraine    "none since ~ 1980" (03/12/2018)   Neuromuscular disorder ([place])    lupus   Osteoarthritis    "all my joints" (03/12/2018)   Squamous cell carcinoma    "right arm"    Past Surgical History:  Procedure Laterality Date   BASAL CELL CARCINOMA EXCISION  01/2018   "upper lip"   BREAST BIOPSY Left     neg   BREAST CYST ASPIRATION Right    CATARACT EXTRACTION W/ INTRAOCULAR  LENS  IMPLANT, BILATERAL     CHOLECYSTECTOMY OPEN  1989   COLONOSCOPY     DILATION AND CURETTAGE OF UTERUS     JOINT REPLACEMENT     KNEE ARTHROSCOPY Bilateral    NASAL SINUS SURGERY     "severe infection"   SHOULDER ARTHROSCOPY Left 12/2014   Dr. Donnamarie Poag CELL CARCINOMA EXCISION Right    arm   TOTAL HIP ARTHROPLASTY Right 08/31/2016   Procedure: TOTAL HIP ARTHROPLASTY ANTERIOR APPROACH;  Surgeon: Frederik Pear, MD;  Location: [place];  Service: Orthopedics;  Laterality: Right;   TOTAL KNEE ARTHROPLASTY Right 03/12/2018   Procedure: TOTAL KNEE ARTHROPLASTY;  Surgeon: Frederik Pear, MD;  Location: [place];  Service: Orthopedics;  Laterality: Right;   TOTAL KNEE ARTHROPLASTY     TOTAL KNEE ARTHROPLASTY Left 01/15/2019   Procedure: TOTAL KNEE ARTHROPLASTY;  Surgeon: Melrose Nakayama, MD;  Location: [facility];  Service: Orthopedics;  Laterality: Left;   VARICOSE VEIN SURGERY Bilateral     Family History  Problem Relation Age of Onset   Heart disease Mother    Heart attack Mother    Hyperlipidemia Father    Other Father        amputaiton   Hyperlipidemia Brother    Hypertension Brother     Social History   Socioeconomic History   Marital status: Married    Spouse name: Jimmie   Number of children: 4   Years of education: 15   Highest education level: Some college, no degree  Occupational History   Occupation: retired    Comment: med assist, Geophysicist/field seismologist  Tobacco Use   Smoking status: Former    Packs/day: 1.00    Years: 5.00    Pack years: 5.00    Types: Cigarettes    Quit date: 1989    Years since quitting: 34.1   Smokeless tobacco: Never  Vaping Use   Vaping Use: Never used  Substance and Sexual Activity   Alcohol use: No   Drug use: No   Sexual activity: Not on file  Other Topics Concern   Not on file  Social History Narrative   Caffeine- 3-4 cups a day   Chores around the house   Does Chief Strategy Officer and selling at [place] Strain: Low Risk    Difficulty of Paying Living Expenses: Not hard at all  Food Insecurity: No Food Insecurity   Worried About Charity fundraiser in the Last Year: Never true   Arboriculturist in the Last Year: Never true  Transportation Needs: No Transportation Needs   Lack of Transportation (Medical): No   Lack of Transportation (Non-Medical): No  Physical Activity: Inactive   Days of Exercise per Week: 0 days   Minutes of Exercise per Session: 0 min  Stress: No Stress Concern Present   Feeling of Stress : Not at all  Social Connections: Moderately Integrated   Frequency of Communication with Friends and Family: Three times a week   Frequency of Social Gatherings with Friends and Family: Never   Attends Religious Services: More than 4 times per year   Active  Member of [employer] or Organizations: No   Attends Archivist Meetings: Never   Marital Status: Married  Human resources officer Violence: Not At Risk   Fear of Current or Ex-Partner: No   Emotionally Abused: No   Physically Abused: No   Sexually Abused:  No    Outpatient Medications Prior to Visit  Medication Sig Dispense Refill   amLODipine (NORVASC) 5 MG tablet TAKE 1 TABLET DAILY 90 tablet 3   atorvastatin (LIPITOR) 20 MG tablet TAKE 1 TABLET DAILY AT 6 P.M. 90 tablet 3   DULoxetine (CYMBALTA) 60 MG capsule TAKE 1 CAPSULE DAILY 90 capsule 1   hydrochlorothiazide (MICROZIDE) 12.5 MG capsule Take 1 capsule (12.5 mg total) by mouth daily. 90 capsule 3   linaclotide (LINZESS) 72 MCG capsule Take 1 capsule (72 mcg total) by mouth daily. 90 capsule 1   Multiple Vitamin (MULTIVITAMIN WITH MINERALS) TABS tablet Take 1 tablet by mouth daily.      Multiple Vitamins-Minerals (HAIR SKIN & NAILS ADVANCED PO) Take by mouth.     traZODone (DESYREL) 100 MG tablet TAKE 1 TABLET AT BEDTIME 90 tablet 3   predniSONE (DELTASONE) 50 MG tablet One tab PO daily for 5 days. 5 tablet 0   No facility-administered medications prior to visit.    Allergies  Allergen Reactions   Gabapentin Other (See Comments)    Hallucinations    Tramadol Other (See Comments)    Hallucinations    Tylenol [Acetaminophen] Other (See Comments)    Due to liver enzyme issue   Pravastatin Other (See Comments)    Muscle cramping   Sulfamethoxazole-Trimethoprim Other (See Comments)    Thrush in mouth    ROS Review of Systems    Objective:    Physical Exam Vitals reviewed.  Constitutional:      Appearance: She is well-developed.  HENT:     Head: Normocephalic and atraumatic.  Eyes:     Conjunctiva/sclera: Conjunctivae normal.  Cardiovascular:     Rate and Rhythm: Normal rate.  Pulmonary:     Effort: Pulmonary effort is normal.  Skin:    General: Skin is dry.  Neurological:     Mental Status: She is alert and  oriented to person, place, and time.  Psychiatric:        Behavior: Behavior normal.    BP (!) 116/51    Pulse (!) 53    Ht _0  (1.651 m)    Wt 184 lb (83.5 kg)    SpO2 94%    BMI 30.62 kg/m  Wt Readings from Last 3 Encounters:  01/20/22 184 lb (83.5 kg)  11/09/21 186 lb (84.4 kg)  10/09/21 173 lb (78.5 kg)     There are no preventive care reminders to display for this patient.   There are no preventive care reminders to display for this patient.  Lab Results  Component Value Date   TSH 0.89 07/13/2017   Lab Results  Component Value Date   WBC 5.8 04/27/2021   HGB 12.0 04/27/2021   HCT 36.9 04/27/2021   MCV 96.3 04/27/2021   PLT 190 04/27/2021   Lab Results  Component Value Date   NA 141 11/09/2021   K 5.0 11/09/2021   CO2 29 11/09/2021   GLUCOSE 90 11/09/2021   BUN 20 11/09/2021   CREATININE 0.90 11/09/2021   BILITOT 0.5 04/27/2021   ALKPHOS 137 (A) 10/24/2016   AST 24 04/27/2021   ALT 15 04/27/2021   PROT 7.2 04/27/2021   ALBUMIN 3.9 07/11/2016   CALCIUM 10.4 11/09/2021   ANIONGAP 8 01/15/2019   EGFR 67 11/09/2021   Lab Results  Component Value Date   CHOL 167 04/27/2021   Lab Results  Component Value Date   HDL 72 04/27/2021   Lab Results  Component  Value Date   LDLCALC 81 04/27/2021   Lab Results  Component Value Date   TRIG 61 04/27/2021   Lab Results  Component Value Date   CHOLHDL 2.3 04/27/2021   Lab Results  Component Value Date   HGBA1C 5.6 04/27/2021      Assessment & Plan:   Problem List Items Addressed This Visit       Other   Systemic lupus erythematosus (SLE) in remission ([place])    Seen at [facility] around 2014 and 15 for lupus.  It was so debilitating at that point in time that she could barely walk.  She did go into remission.      Left flank pain - Primary   Relevant Orders   CT Abdomen Pelvis W Contrast   Urinalysis   Other Visit Diagnoses     Hypokalemia       Relevant Orders   COMPLETE METABOLIC PANEL WITH  GFR   Elevated antinuclear antibody (ANA) level       Relevant Orders   Sedimentation rate   C-reactive protein   ANA       Flank pain, left -we will go ahead and move forward with CT of the abdomen.  We will check a urine for protein loss and blood loss.  It seems to almost be positional as it is triggering only at night and only about an hour after she goes to bed she tends to lay on her left side.  If negative then consider further work-up with MRI of the thoracic spine.  Hypokalemia-we will make sure that the over-the-counter that she is taking currently is adequate especially since she still having lots of frequent stools.   No orders of the defined types were placed in this encounter.   Follow-up: No follow-ups on file.    Beatrice Lecher, MD

## 2022-01-20 NOTE — Assessment & Plan Note (Signed)
Seen at [facility] around 2014 and 15 for lupus.  It was so debilitating at that point in time that she could barely walk.  She did go into remission. ?

## 2022-01-21 ENCOUNTER — Other Ambulatory Visit: Payer: Self-pay | Admitting: *Deleted

## 2022-01-21 DIAGNOSIS — R809 Proteinuria, unspecified: Secondary | ICD-10-CM

## 2022-01-21 DIAGNOSIS — E876 Hypokalemia: Secondary | ICD-10-CM

## 2022-01-21 MED ORDER — POTASSIUM CHLORIDE CRYS ER 20 MEQ PO TBCR: EXTENDED_RELEASE_TABLET | ORAL | 0 refills | Status: DC

## 2022-01-21 NOTE — Progress Notes (Signed)
Hi Chailyn,  ?Your look mildly dehydrated on your labs.  Your BUN and creatinine are both elevated which indicates that you need to increase your water intake.  Also your potassium is still very low at 2.7.  So I think your frequent stools is still causing you to lose a lot of potassium.  So I am going to send over a prescription version for you to take which is a little bit more high-powered and then I definitely want to recheck your potassium in 1 week.  Please take 2 tabs a day for today and tomorrow and then can drop down to once a day.  The prescription will just say once a day.  Calcium level is just a little elevated but that is not worrisome right now in the setting that you do look a little bit dehydrated.  Liver function is normal.  No blood in the urine.  We did see a little bit of protein so when you come back to have your potassium rechecked next week I want to have you do a urine sample for microscopic levels of protein.  It is called a microalbumin.  Your inflammatory markers are quite elevated as well your sed rate is in the 80s and your CRP is around 15.  Still awaiting additional testing.

## 2022-01-21 NOTE — Addendum Note (Signed)
Addended by: Beatrice Lecher D on: 01/21/2022 07:42 AM ? ? Modules accepted: Orders ? ?

## 2022-01-22 LAB — URINALYSIS
Bilirubin Urine: NEGATIVE
Glucose, UA: NEGATIVE
Hgb urine dipstick: NEGATIVE
Ketones, ur: NEGATIVE
Leukocytes,Ua: NEGATIVE
Nitrite: NEGATIVE
Specific Gravity, Urine: 1.021 (ref 1.001–1.035)
pH: 6 (ref 5.0–8.0)

## 2022-01-22 LAB — COMPLETE METABOLIC PANEL WITH GFR
AG Ratio: 1.5 (calc) (ref 1.0–2.5)
ALT: 18 U/L (ref 6–29)
AST: 32 U/L (ref 10–35)
Albumin: 4.5 g/dL (ref 3.6–5.1)
Alkaline phosphatase (APISO): 93 U/L (ref 37–153)
BUN/Creatinine Ratio: 24 (calc) — ABNORMAL HIGH (ref 6–22)
BUN: 31 mg/dL — ABNORMAL HIGH (ref 7–25)
CO2: 30 mmol/L (ref 20–32)
Calcium: 10.6 mg/dL — ABNORMAL HIGH (ref 8.6–10.4)
Chloride: 100 mmol/L (ref 98–110)
Creat: 1.27 mg/dL — ABNORMAL HIGH (ref 0.60–1.00)
Globulin: 3.1 g/dL (calc) (ref 1.9–3.7)
Glucose, Bld: 119 mg/dL (ref 65–139)
Potassium: 2.7 mmol/L — CL (ref 3.5–5.3)
Sodium: 140 mmol/L (ref 135–146)
Total Bilirubin: 0.5 mg/dL (ref 0.2–1.2)
Total Protein: 7.6 g/dL (ref 6.1–8.1)
eGFR: 44 mL/min/{1.73_m2} — ABNORMAL LOW (ref >=60)

## 2022-01-22 LAB — ANTI-NUCLEAR AB-TITER (ANA TITER): ANA Titer 1: 1:160 {titer} — ABNORMAL HIGH

## 2022-01-22 LAB — ANA: Anti Nuclear Antibody (ANA): POSITIVE — AB

## 2022-01-22 LAB — SEDIMENTATION RATE: Sed Rate: 87 mm/h — ABNORMAL HIGH (ref 0–30)

## 2022-01-22 LAB — C-REACTIVE PROTEIN: CRP: 15.9 mg/L — ABNORMAL HIGH (ref <8.0)

## 2022-01-24 NOTE — Progress Notes (Signed)
Pamala Hurry, Your ANA did come back.  Level was slightly elevated at a ratio of 1:160.  We can certainly keep an eye on this if you start to feel like you are experiencing symptoms especially with increased fatigue joint pain etc..

## 2022-01-28 DIAGNOSIS — R809 Proteinuria, unspecified: Secondary | ICD-10-CM | POA: Diagnosis not present

## 2022-01-28 DIAGNOSIS — E876 Hypokalemia: Secondary | ICD-10-CM | POA: Diagnosis not present

## 2022-01-29 LAB — MICROALBUMIN, URINE: Microalb, Ur: 1.2 mg/dL

## 2022-01-29 LAB — POTASSIUM: Potassium: 3.6 mmol/L (ref 3.5–5.3)

## 2022-01-29 NOTE — Progress Notes (Signed)
Repeat potassium is back to normal.

## 2022-01-31 ENCOUNTER — Ambulatory Visit (INDEPENDENT_AMBULATORY_CARE_PROVIDER_SITE_OTHER): Payer: Medicare Other

## 2022-01-31 ENCOUNTER — Other Ambulatory Visit: Payer: Self-pay

## 2022-01-31 DIAGNOSIS — R109 Unspecified abdominal pain: Secondary | ICD-10-CM

## 2022-01-31 DIAGNOSIS — N281 Cyst of kidney, acquired: Secondary | ICD-10-CM | POA: Diagnosis not present

## 2022-01-31 DIAGNOSIS — K5900 Constipation, unspecified: Secondary | ICD-10-CM | POA: Diagnosis not present

## 2022-01-31 MED ORDER — IOHEXOL 300 MG/ML  SOLN
100.0000 mL | Freq: Once | INTRAMUSCULAR | Status: AC | PRN
  Administered 2022-01-31: 80 mL via INTRAVENOUS

## 2022-02-01 NOTE — Progress Notes (Signed)
Hi Rafia, the CT noted some liquid stool seen in the colon possibly suggesting what is called an enteritis which is just inflammation of the gut.  I know you had mentioned your chronic diarrhea but that it had been worse lately so would like to consider getting you in with GI or back to your GI doctor to take a look at this.  Like to see if they would make any additional recommendations.  Not related to your pain they did notice a little bit of scar tissue at the bottom of the left lung.  Some plaque formation in the coronary arteries which are the arteries to the blood vessels around your heart.  They did note some what they call "tortuous vessels" in the pelvic region where your ovaries would sit.  Basically this just means that the blood vessels twist and turn.  And they said that this could lead to a little bit of backup pressure on the venous system.  This is not where you are hurting though so I do not think that this is related.  But can sometimes cause some pressure sensation in the pelvic region.  Also have a very small hernia at your bellybutton which mostly is just containing fat no bowel seen.  So its not worrisome and does not need repair unless it becomes painful or larger over time.

## 2022-02-02 NOTE — Progress Notes (Signed)
Please place GI referral.  In regards to the pain coming from her back it certainly could.  Who did she see for injections before?  We can always make referral back there as well.

## 2022-02-03 ENCOUNTER — Other Ambulatory Visit: Payer: Self-pay | Admitting: *Deleted

## 2022-02-04 ENCOUNTER — Other Ambulatory Visit: Payer: Self-pay | Admitting: Sports Medicine

## 2022-02-04 DIAGNOSIS — M51369 Other intervertebral disc degeneration, lumbar region without mention of lumbar back pain or lower extremity pain: Secondary | ICD-10-CM

## 2022-02-04 DIAGNOSIS — M5136 Other intervertebral disc degeneration, lumbar region: Secondary | ICD-10-CM

## 2022-02-04 NOTE — Progress Notes (Signed)
I do not order those injections that would need to come from the provider who ordered them before.  Dr. Darene Lamer may be?  Not sure.  But that so we would need to get her back in to her that they could decide what level and what place they would like the injections to be performed.

## 2022-02-07 ENCOUNTER — Other Ambulatory Visit: Payer: Medicare Other

## 2022-02-14 ENCOUNTER — Ambulatory Visit (INDEPENDENT_AMBULATORY_CARE_PROVIDER_SITE_OTHER): Payer: Medicare Other | Admitting: Sports Medicine

## 2022-02-14 ENCOUNTER — Other Ambulatory Visit: Payer: Self-pay

## 2022-02-14 ENCOUNTER — Telehealth: Payer: Self-pay | Admitting: Family Medicine

## 2022-02-14 DIAGNOSIS — M51369 Other intervertebral disc degeneration, lumbar region without mention of lumbar back pain or lower extremity pain: Secondary | ICD-10-CM

## 2022-02-14 DIAGNOSIS — G894 Chronic pain syndrome: Secondary | ICD-10-CM

## 2022-02-14 DIAGNOSIS — M5136 Other intervertebral disc degeneration, lumbar region: Secondary | ICD-10-CM

## 2022-02-14 MED ORDER — HYDROCODONE-ACETAMINOPHEN 5-325 MG PO TABS: 1.0000 | ORAL_TABLET | Freq: Three times a day (TID) | ORAL | 0 refills | Status: DC | PRN

## 2022-02-14 MED ORDER — IBUPROFEN 800 MG PO TABS: 800.0000 mg | ORAL_TABLET | Freq: Every day | ORAL | 2 refills | Status: DC

## 2022-02-14 NOTE — Telephone Encounter (Signed)
Please call the pharmacy and have them rerun her Linzess now that it has been authorized.  It says she has not heard from the pharmacy. ?

## 2022-02-14 NOTE — Progress Notes (Signed)
? ? ?  Procedures performed today:   ? ?None. ? ?Independent interpretation of notes and tests performed by another provider:  ? ?None. ? ?Brief History, Exam, Impression, and Recommendations:   ? ?DDD (degenerative disc disease), lumbar ?Yuritzi returns, she is a 76 year old female, we have been treating her for many years for multilevel lumbar spondylosis, she had an L3-L4 transforaminal epidural about 6 to 7 years ago that seem to work really well, at the more recent visit she was having higher back pain, left quadratus lumborum, worse at night, better with flexion. ?She did not improve with prednisone, we got an updated MRI and proceeded with a left L4-L5 interlaminar epidural that did not produce any relief. ?She also had a very large renal cyst on her left. ?Today her pain is predominantly in the upper lumbar spine, left-sided, as well as lower thoracic spine. ?On review of her MRI as well as her CT she has widespread multilevel thoracolumbar facet degenerative disc disease, as well as multilevel thoracolumbar disc disease. ?I do not think it is prudent to try multilevel injections, and we will adopt more of a pain management approach, starting with an increase of her 400 mg of nighttime ibuprofen to 800 mg nightly with a meal, as well as hydrocodone nightly. ?If this works she will transition to simply 800 mg of ibuprofen in the evening. ?If it does not work she is agreeable to start Lyrica, she did not tolerate gabapentin. ? ?Chronic pain syndrome ?See below, we will be adding a short course of hydrocodone potentially followed by Lyrica as she did not tolerate gabapentin. ?Considering her widespread lumbar degenerative processes I do not think an interventional approach is practical here. ? ?Chronic process with exacerbation and pharmacologic intervention ? ?___________________________________________ ?Gwen Her. Dianah Field, M.D., ABFM., CAQSM. ?Primary Care and Sports Medicine ?[place] ? ?Adjunct Instructor of Family Medicine  ?[employer] ?

## 2022-02-14 NOTE — Assessment & Plan Note (Signed)
Katherine Nixon returns, she is a 76 year old female, we have been treating her for many years for multilevel lumbar spondylosis, she had an L3-L4 transforaminal epidural about 6 to 7 years ago that seem to work really well, at the more recent visit she was having higher back pain, left quadratus lumborum, worse at night, better with flexion. ?She did not improve with prednisone, we got an updated MRI and proceeded with a left L4-L5 interlaminar epidural that did not produce any relief. ?She also had a very large renal cyst on her left. ?Today her pain is predominantly in the upper lumbar spine, left-sided, as well as lower thoracic spine. ?On review of her MRI as well as her CT she has widespread multilevel thoracolumbar facet degenerative disc disease, as well as multilevel thoracolumbar disc disease. ?I do not think it is prudent to try multilevel injections, and we will adopt more of a pain management approach, starting with an increase of her 400 mg of nighttime ibuprofen to 800 mg nightly with a meal, as well as hydrocodone nightly. ?If this works she will transition to simply 800 mg of ibuprofen in the evening. ?If it does not work she is agreeable to start Lyrica, she did not tolerate gabapentin. ?

## 2022-02-14 NOTE — Assessment & Plan Note (Signed)
See below, we will be adding a short course of hydrocodone potentially followed by Lyrica as she did not tolerate gabapentin. ?Considering her widespread lumbar degenerative processes I do not think an interventional approach is practical here. ?

## 2022-02-15 NOTE — Telephone Encounter (Signed)
Pt called an informed that her medication is being shipped out.  ?

## 2022-02-15 NOTE — Telephone Encounter (Signed)
Called pt's mail order  advised that this medication was approved. Spoke w/Anita she was given pt's name,address,DOB and phone number and informed me that this medication is now being processed.  ?

## 2022-03-14 ENCOUNTER — Other Ambulatory Visit: Payer: Self-pay | Admitting: Family Medicine

## 2022-03-14 DIAGNOSIS — I10 Essential (primary) hypertension: Secondary | ICD-10-CM

## 2022-03-16 DIAGNOSIS — Z20822 Contact with and (suspected) exposure to covid-19: Secondary | ICD-10-CM | POA: Diagnosis not present

## 2022-03-17 ENCOUNTER — Other Ambulatory Visit: Payer: Self-pay | Admitting: Family Medicine

## 2022-03-28 ENCOUNTER — Ambulatory Visit (INDEPENDENT_AMBULATORY_CARE_PROVIDER_SITE_OTHER): Payer: Medicare Other | Admitting: Sports Medicine

## 2022-03-28 DIAGNOSIS — M5136 Other intervertebral disc degeneration, lumbar region: Secondary | ICD-10-CM | POA: Diagnosis not present

## 2022-03-28 DIAGNOSIS — M51369 Other intervertebral disc degeneration, lumbar region without mention of lumbar back pain or lower extremity pain: Secondary | ICD-10-CM

## 2022-03-28 DIAGNOSIS — Z20822 Contact with and (suspected) exposure to covid-19: Secondary | ICD-10-CM | POA: Diagnosis not present

## 2022-03-28 MED ORDER — PREGABALIN 25 MG PO CAPS: ORAL_CAPSULE | ORAL | 0 refills | Status: DC

## 2022-03-28 NOTE — Progress Notes (Signed)
? ? ?  Procedures performed today:   ? ?None. ? ?Independent interpretation of notes and tests performed by another provider:  ? ?None. ? ?Brief History, Exam, Impression, and Recommendations:   ? ?DDD (degenerative disc disease), lumbar ?Talah returns, she is a very pleasant 76 year old female, she has multilevel thoracolumbar spondylosis with disc disease and facet arthritis, she more recently had a lumbar epidural without much relief, she also has a very large renal cyst on the left. ?Pain has predominantly been left flank, upper lumbar and lower thoracic spine. ?On review of her MRI and her CT she does have widespread multilevel thoracolumbar facet arthritis and degenerative disc disease. ?Considering the diffuse nature of her degenerative disc disease I suspected we need to take more of a medical pain management approach rather than interventional injection approach. ?We bumped her ibuprofen up to 800 mg nightly with a meal and this has helped significantly. ?Hydrocodone did get her some sleep through the night but she understood that this was not going to be long-term, she did not tolerate gabapentin, she is agreeable to try Lyrica, we will start with 25 mg nightly with an increase every few days. ?Telephone visit for follow-up and dose titration in a month. ? ?Chronic process with exacerbation and pharmacologic intervention ? ?___________________________________________ ?Gwen Her. Dianah Field, M.D., ABFM., CAQSM. ?Primary Care and Sports Medicine ?[place] ? ?Adjunct Instructor of Family Medicine  ?[employer] ?

## 2022-03-28 NOTE — Assessment & Plan Note (Signed)
Katherine Nixon returns, she is a very pleasant 76 year old female, she has multilevel thoracolumbar spondylosis with disc disease and facet arthritis, she more recently had a lumbar epidural without much relief, she also has a very large renal cyst on the left. ?Pain has predominantly been left flank, upper lumbar and lower thoracic spine. ?On review of her MRI and her CT she does have widespread multilevel thoracolumbar facet arthritis and degenerative disc disease. ?Considering the diffuse nature of her degenerative disc disease I suspected we need to take more of a medical pain management approach rather than interventional injection approach. ?We bumped her ibuprofen up to 800 mg nightly with a meal and this has helped significantly. ?Hydrocodone did get her some sleep through the night but she understood that this was not going to be long-term, she did not tolerate gabapentin, she is agreeable to try Lyrica, we will start with 25 mg nightly with an increase every few days. ?Telephone visit for follow-up and dose titration in a month. ?

## 2022-04-06 DIAGNOSIS — R197 Diarrhea, unspecified: Secondary | ICD-10-CM | POA: Diagnosis not present

## 2022-04-11 DIAGNOSIS — R197 Diarrhea, unspecified: Secondary | ICD-10-CM | POA: Diagnosis not present

## 2022-04-25 ENCOUNTER — Ambulatory Visit (INDEPENDENT_AMBULATORY_CARE_PROVIDER_SITE_OTHER): Payer: Medicare Other | Admitting: Sports Medicine

## 2022-04-25 DIAGNOSIS — M5136 Other intervertebral disc degeneration, lumbar region: Secondary | ICD-10-CM | POA: Diagnosis not present

## 2022-04-25 DIAGNOSIS — M51369 Other intervertebral disc degeneration, lumbar region without mention of lumbar back pain or lower extremity pain: Secondary | ICD-10-CM

## 2022-04-25 MED ORDER — PREGABALIN 75 MG PO CAPS: 75.0000 mg | ORAL_CAPSULE | Freq: Every day | ORAL | 3 refills | Status: DC

## 2022-04-25 NOTE — Assessment & Plan Note (Signed)
Pleasant 76 year old female, she has multilevel thoracolumbar spondylosis with disc disease and facet arthritis, very diffuse. Epidurals have not provided much relief. Considering the diffuse nature of her degenerative processes we decided to take more of a medical approach rather than a interventional pain management approach. We bumped up her ibuprofen 800 mg nightly with a meal, we also did an up taper of Lyrica, she settled on 75 mg nightly which allows her to sleep through the night and wake up without pain through the day. Refilling this, long-term supply to her mail order pharmacy, she can return to see me as needed.

## 2022-04-25 NOTE — Progress Notes (Signed)
   Virtual Visit via Telephone   I connected with  Katherine Nixon  on 04/25/22 by telephone/telehealth and verified that I am speaking with the correct person using two identifiers.   I discussed the limitations, risks, security and privacy concerns of performing an evaluation and management service by telephone, including the higher likelihood of inaccurate diagnosis and treatment, and the availability of in person appointments.  We also discussed the likely need of an additional face to face encounter for complete and high quality delivery of care.  I also discussed with the patient that there may be a patient responsible charge related to this service. The patient expressed understanding and wishes to proceed.  Provider location is in medical facility. Patient location is at their home, different from provider location. People involved in care of the patient during this telehealth encounter were myself, my nurse/medical assistant, and my front office/scheduling team member.  Review of Systems: No fevers, chills, night sweats, weight loss, chest pain, or shortness of breath.   Objective Findings:    General: Speaking full sentences, no audible heavy breathing.  Sounds alert and appropriately interactive.    Independent interpretation of tests performed by another provider:   None.  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 Epidurals have not provided much relief. Considering the diffuse nature of her degenerative processes we decided to take more of a medical approach rather than a interventional pain management approach. We bumped up her ibuprofen 800 mg nightly with a meal, we also did an up taper of Lyrica, she settled on 75 mg nightly which allows her to sleep through the night and wake up without  pain through the day. Refilling this, long-term supply to her mail order pharmacy, she can return to see me as needed.  I discussed the above assessment and treatment plan with the patient. The patient was provided an opportunity to ask questions and all were answered. The patient agreed with the plan and demonstrated an understanding of the instructions.   The patient was advised to call back or seek an in-person evaluation if the symptoms worsen or if the condition fails to improve as anticipated.   I provided 30 minutes of verbal and non-verbal time during this encounter date, time was needed to gather information, review chart, records, communicate/coordinate with staff remotely, as well as complete documentation.   ___________________________________________ Gwen Her. Dianah Field, M.D., ABFM., CAQSM. Primary Care and Sports Medicine [facility]  Adjunct Professor of [employer]

## 2022-04-27 ENCOUNTER — Ambulatory Visit (INDEPENDENT_AMBULATORY_CARE_PROVIDER_SITE_OTHER): Payer: Medicare Other | Admitting: Family Medicine

## 2022-04-27 DIAGNOSIS — Z0000 Encounter for general adult medical examination without abnormal findings: Secondary | ICD-10-CM | POA: Diagnosis not present

## 2022-04-27 NOTE — Patient Instructions (Signed)
[place] Maintenance Summary and Written Plan of Care  Ms. Katherine Nixon ,  Thank you for allowing me to perform your Medicare Annual Wellness Visit and for your ongoing commitment to your health.   Health Maintenance & Immunization History Health Maintenance  Topic Date Due   Zoster Vaccines- Shingrix (1 of 2) 01/21/2023 (Originally 10/22/1996)   COVID-19 Vaccine (3 - Booster for [place] series) 02/06/2023 (Originally 09/29/2020)   COLONOSCOPY (Pts 45-75yr Insurance coverage will need to be confirmed)  04/30/2022   INFLUENZA VACCINE  06/21/2022   TETANUS/TDAP  12/29/2027   Pneumonia Vaccine 76 Years old  Completed   Hepatitis C Screening  Completed   HPV VACCINES  Aged Out   Immunization History  Administered Date(s) Administered   Fluad Quad(high Dose 65+) 11/09/2021   Influenza Split 09/01/2011   Influenza, High Dose Seasonal PF 07/13/2017, 10/10/2018   Influenza,inj,Quad PF,6+ Mos 12/30/2014, 09/23/2015   Influenza-Unspecified 09/01/2011, 12/30/2014, 09/23/2015, 08/18/2016   PFIZER(Purple Top)SARS-COV-2 Vaccination 07/14/2020, 08/04/2020   Pneumococcal Conjugate-13 02/10/2015   Pneumococcal Polysaccharide-23 06/22/2012   Tdap 12/28/2017   Zoster, Live 05/26/2014    These are the patient goals that we discussed:  Goals Addressed               This Visit's Progress     Patient Stated (pt-stated)        Patient would like to loose 20 lbs.         This is a list of Health Maintenance Items that are overdue or due now: Shingles vaccine  Orders/Referrals Placed Today: No orders of the defined types were placed in this encounter.  (Contact our referral department at [phone] you have not spoken with someone about your referral appointment within the next 5 days)    Follow-up Plan Follow-up with MHali Marry MD as planned Schedule your Shingrix vaccine at your pharmacy. Medicare wellness visit in one year. Patient will  access AVS on my chart.      Health Maintenance, Female Adopting a healthy lifestyle and getting preventive care are important in promoting health and wellness. Ask your health care provider about: The right schedule for you to have regular tests and exams. Things you can do on your own to prevent diseases and keep yourself healthy. What should I know about diet, weight, and exercise? Eat a healthy diet  Eat a diet that includes plenty of vegetables, fruits, low-fat dairy products, and lean protein. Do not eat a lot of foods that are high in solid fats, added sugars, or sodium. Maintain a healthy weight Body mass index (BMI) is used to identify weight problems. It estimates body fat based on height and weight. Your health care provider can help determine your BMI and help you achieve or maintain a healthy weight. Get regular exercise Get regular exercise. This is one of the most important things you can do for your health. Most adults should: Exercise for at least 150 minutes each week. The exercise should increase your heart rate and make you sweat (moderate-intensity exercise). Do strengthening exercises at least twice a week. This is in addition to the moderate-intensity exercise. Spend less time sitting. Even light physical activity can be beneficial. Watch cholesterol and blood lipids Have your blood tested for lipids and cholesterol at 76years of age, then have this test every 5 years. Have your cholesterol levels checked more often if: Your lipid or cholesterol levels are high. You are older than 76years of age. You  are at high risk for heart disease. What should I know about cancer screening? Depending on your health history and family history, you may need to have cancer screening at various ages. This may include screening for: Breast cancer. Cervical cancer. Colorectal cancer. Skin cancer. Lung cancer. What should I know about heart disease, diabetes, and high blood  pressure? Blood pressure and heart disease High blood pressure causes heart disease and increases the risk of stroke. This is more likely to develop in people who have high blood pressure readings or are overweight. Have your blood pressure checked: Every 3-5 years if you are 57-88 years of age. Every year if you are 37 years old or older. Diabetes Have regular diabetes screenings. This checks your fasting blood sugar level. Have the screening done: Once every three years after age 33 if you are at a normal weight and have a low risk for diabetes. More often and at a younger age if you are overweight or have a high risk for diabetes. What should I know about preventing infection? Hepatitis B If you have a higher risk for hepatitis B, you should be screened for this virus. Talk with your health care provider to find out if you are at risk for hepatitis B infection. Hepatitis C Testing is recommended for: Everyone born from [birth year] through 1965. Anyone with known risk factors for hepatitis C. Sexually transmitted infections (STIs) Get screened for STIs, including gonorrhea and chlamydia, if: You are sexually active and are younger than 76 years of age. You are older than 76 years of age and your health care provider tells you that you are at risk for this type of infection. Your sexual activity has changed since you were last screened, and you are at increased risk for chlamydia or gonorrhea. Ask your health care provider if you are at risk. Ask your health care provider about whether you are at high risk for HIV. Your health care provider may recommend a prescription medicine to help prevent HIV infection. If you choose to take medicine to prevent HIV, you should first get tested for HIV. You should then be tested every 3 months for as long as you are taking the medicine. Pregnancy If you are about to stop having your period (premenopausal) and you may become pregnant, seek counseling before you  get pregnant. Take 400 to 800 micrograms (mcg) of folic acid every day if you become pregnant. Ask for birth control (contraception) if you want to prevent pregnancy. Osteoporosis and menopause Osteoporosis is a disease in which the bones lose minerals and strength with aging. This can result in bone fractures. If you are 81 years old or older, or if you are at risk for osteoporosis and fractures, ask your health care provider if you should: Be screened for bone loss. Take a calcium or vitamin D supplement to lower your risk of fractures. Be given hormone replacement therapy (HRT) to treat symptoms of menopause. Follow these instructions at home: Alcohol use Do not drink alcohol if: Your health care provider tells you not to drink. You are pregnant, may be pregnant, or are planning to become pregnant. If you drink alcohol: Limit how much you have to: 0-1 drink a day. Know how much alcohol is in your drink. In the [place] one drink equals one 12 oz bottle of beer (355 mL), one 5 oz glass of wine (148 mL), or one 1 oz glass of hard liquor (44 mL). Lifestyle Do not use any products that contain  nicotine or tobacco. These products include cigarettes, chewing tobacco, and vaping devices, such as e-cigarettes. If you need help quitting, ask your health care provider. Do not use street drugs. Do not share needles. Ask your health care provider for help if you need support or information about quitting drugs. General instructions Schedule regular health, dental, and eye exams. Stay current with your vaccines. Tell your health care provider if: You often feel depressed. You have ever been abused or do not feel safe at home. Summary Adopting a healthy lifestyle and getting preventive care are important in promoting health and wellness. Follow your health care provider's instructions about healthy diet, exercising, and getting tested or screened for diseases. Follow your health care provider's  instructions on monitoring your cholesterol and blood pressure. This information is not intended to replace advice given to you by your health care provider. Make sure you discuss any questions you have with your health care provider. Document Revised: 03/29/2021 Document Reviewed: 03/29/2021 Elsevier Patient Education  [place].

## 2022-04-27 NOTE — Progress Notes (Signed)
MEDICARE ANNUAL WELLNESS VISIT  04/27/2022  Telephone Visit Disclaimer This Medicare AWV was conducted by telephone due to national recommendations for restrictions regarding the COVID-19 Pandemic (e.g. social distancing).  I verified, using two identifiers, that I am speaking with Katherine Nixon or their authorized healthcare agent. I discussed the limitations, risks, security, and privacy concerns of performing an evaluation and management service by telephone and the potential availability of an in-person appointment in the future. The patient expressed understanding and agreed to proceed.  Location of Patient: Home Location of Provider (nurse):  In the office.  Subjective:    Katherine Nixon is a 76 y.o. female patient of Metheney, Rene Kocher, MD who had a Medicare Annual Wellness Visit today via telephone. Katherine Nixon is Retired and lives with their spouse. she has 4 children. she reports that she is socially active and does interact with friends/family regularly. she is minimally physically active and enjoys paper crafting.  Patient Care Team: Hali Marry, MD as PCP - General (Family Medicine) Anne Shutter, MD as Physician Assistant (Rheumatology) Milinda Cave, FNP as Nurse Practitioner (Rheumatology)     04/27/2022   11:03 AM 02/10/2021    9:03 AM 02/24/2020   11:15 AM 01/08/2020   10:05 AM 01/15/2019    4:31 PM 01/07/2019   11:57 AM 03/12/2018    8:55 PM  Advanced Directives  Does Patient Have a Medical Advance Directive? Yes Yes Yes Yes Yes Yes Yes  Type of Advance Directive Living will [place];Living will [place];Living will [place];Living will Living will Living will [place];Living will  Does patient want to make changes to medical advance directive? No - Patient declined No - Patient declined  No - Patient declined No - Patient declined No - Patient declined No - Patient  declined  Copy of [place] in Chart?  No - copy requested  Yes - validated most recent copy scanned in chart (See row information)   Yes    Hospital Utilization Over the Past 12 Months: # of hospitalizations or ER visits: 1 # of surgeries: 0  Review of Systems    Patient reports that her overall health is unchanged compared to last year.  History obtained from chart review and the patient  Patient Reported Readings (BP, Pulse, CBG, Weight, etc) none  Pain Assessment Pain : No/denies pain     Current Medications & Allergies (verified) Allergies as of 04/27/2022       Reactions   Gabapentin Other (See Comments)   Hallucinations   Tramadol Other (See Comments)   Hallucinations   Tylenol [acetaminophen] Other (See Comments)   Due to liver enzyme issue   Pravastatin Other (See Comments)   Muscle cramping   Sulfamethoxazole-trimethoprim Other (See Comments)   Thrush in mouth        Medication List        Accurate as of April 27, 2022 11:14 AM. If you have any questions, ask your nurse or doctor.          amLODipine 5 MG tablet Commonly known as: NORVASC TAKE 1 TABLET DAILY   atorvastatin 20 MG tablet Commonly known as: LIPITOR TAKE 1 TABLET DAILY AT 6 P.M.   DULoxetine 60 MG capsule Commonly known as: CYMBALTA TAKE 1 CAPSULE DAILY   HAIR SKIN & NAILS ADVANCED PO Take by mouth.   hydrochlorothiazide 12.5 MG capsule Commonly known as: MICROZIDE TAKE 1 CAPSULE  DAILY   ibuprofen 800 MG tablet Commonly known as: ADVIL Take 1 tablet (800 mg total) by mouth at bedtime.   multivitamin with minerals Tabs tablet Take 1 tablet by mouth daily.   potassium chloride SA 20 MEQ tablet Commonly known as: KLOR-CON M Take 2 tabs for the first 2 days and then okay to drop down to once a day.   pregabalin 75 MG capsule Commonly known as: Lyrica Take 1 capsule (75 mg total) by mouth at bedtime.   traZODone 100 MG tablet Commonly known as:  DESYREL TAKE 1 TABLET AT BEDTIME        History (reviewed): Past Medical History:  Diagnosis Date   Anxiety    Basal cell carcinoma 01/2018   "upper lip"   Chronic diarrhea    Complication of anesthesia 1989   diff to wake up with gallbaldder surgery- no problems since (03/12/2018)   Connective tissue disorder ([place])    "lupus"   Depression    Dysrhythmia     states at times, last occurence was january 2019 feels like heart fluttering , last a couple seconds, denies any associated cardiac symtpoms. rpeorts at her last annual check up her pcp Metheney  heard it on pysical  exam and ordred an ECHO , per patietn no one reached out to her to set it up    Heart murmur    High cholesterol    History of blood transfusion 1970   "related to childbirth"   Hypertension    IBS (irritable bowel syndrome)    Migraine    "none since ~ 1980" (03/12/2018)   Neuromuscular disorder ([place])    lupus   Osteoarthritis    "all my joints" (03/12/2018)   Squamous cell carcinoma    "right arm"   Past Surgical History:  Procedure Laterality Date   BASAL CELL CARCINOMA EXCISION  01/2018   "upper lip"   BREAST BIOPSY Left     neg   BREAST CYST ASPIRATION Right    CATARACT EXTRACTION W/ INTRAOCULAR LENS  IMPLANT, BILATERAL     CHOLECYSTECTOMY OPEN  1989   COLONOSCOPY     DILATION AND CURETTAGE OF UTERUS     JOINT REPLACEMENT     KNEE ARTHROSCOPY Bilateral    NASAL SINUS SURGERY     "severe infection"   SHOULDER ARTHROSCOPY Left 12/2014   Dr. Donnamarie Poag CELL CARCINOMA EXCISION Right    arm   TOTAL HIP ARTHROPLASTY Right 08/31/2016   Procedure: TOTAL HIP ARTHROPLASTY ANTERIOR APPROACH;  Surgeon: Frederik Pear, MD;  Location: [facility];  Service: Orthopedics;  Laterality: Right;   TOTAL KNEE ARTHROPLASTY Right 03/12/2018   Procedure: TOTAL KNEE ARTHROPLASTY;  Surgeon: Frederik Pear, MD;  Location: [facility];  Service: Orthopedics;  Laterality: Right;   TOTAL KNEE ARTHROPLASTY     TOTAL KNEE  ARTHROPLASTY Left 01/15/2019   Procedure: TOTAL KNEE ARTHROPLASTY;  Surgeon: Melrose Nakayama, MD;  Location: [facility];  Service: Orthopedics;  Laterality: Left;   VARICOSE VEIN SURGERY Bilateral    Family History  Problem Relation Age of Onset   Heart disease Mother    Heart attack Mother    Hyperlipidemia Father    Other Father        amputaiton   Hyperlipidemia Brother    Hypertension Brother    Social History   Socioeconomic History   Marital status: Married    Spouse name: Jimmie   Number of children: 4   Years of education:  15   Highest education level: Some college, no degree  Occupational History   Occupation: retired    Comment: med assist, Geophysicist/field seismologist  Tobacco Use   Smoking status: Former    Packs/day: 1.00    Years: 5.00    Pack years: 5.00    Types: Cigarettes    Quit date: 1989    Years since quitting: 34.4   Smokeless tobacco: Never  Vaping Use   Vaping Use: Never used  Substance and Sexual Activity   Alcohol use: No   Drug use: No   Sexual activity: Not on file  Other Topics Concern   Not on file  Social History Narrative   Lives with her spouse. She enjoys crafting.    Social Determinants of Health   Financial Resource Strain: Low Risk    Difficulty of Paying Living Expenses: Not hard at all  Food Insecurity: No Food Insecurity   Worried About Charity fundraiser in the Last Year: Never true   [place] in the Last Year: Never true  Transportation Needs: No Transportation Needs   Lack of Transportation (Medical): No   Lack of Transportation (Non-Medical): No  Physical Activity: Inactive   Days of Exercise per Week: 0 days   Minutes of Exercise per Session: 0 min  Stress: No Stress Concern Present   Feeling of Stress : Not at all  Social Connections: Moderately Isolated   Frequency of Communication with Friends and Family: Once a week   Frequency of Social Gatherings with Friends and Family: Never   Attends Religious Services: More  than 4 times per year   Active Member of [employer] or Organizations: No   Attends Archivist Meetings: Never   Marital Status: Married    Activities of Daily Living    04/27/2022   11:08 AM  In your present state of health, do you have any difficulty performing the following activities:  Hearing? 1  Comment hearing loss in left ear.  Vision? 0  Difficulty concentrating or making decisions? 1  Comment difficulty remembering.  Walking or climbing stairs? 0  Dressing or bathing? 0  Doing errands, shopping? 0  Preparing Food and eating ? N  Using the Toilet? N  In the past six months, have you accidently leaked urine? N  Do you have problems with loss of bowel control? Y  Comment when she has diarrhea.  Managing your Medications? N  Managing your Finances? N  Housekeeping or managing your Housekeeping? N    Patient Education/ Literacy How often do you need to have someone help you when you read instructions, pamphlets, or other written materials from your doctor or pharmacy?: 1 - Never What is the last grade level you completed in school?: Associates degree  Exercise Current Exercise Habits: The patient does not participate in regular exercise at present, Exercise limited by: orthopedic condition(s) (back pain and balance problems.)  Diet Patient reports consuming 3 meals a day and 1 snack(s) a day Patient reports that her primary diet is: Regular Patient reports that she does have regular access to food.   Depression Screen    04/27/2022   11:04 AM 04/27/2021   11:21 AM 02/10/2021    9:05 AM 01/08/2020   10:06 AM 04/10/2019   10:23 AM 06/04/2018    3:29 PM 07/13/2017   10:43 AM  PHQ 2/9 Scores  PHQ - 2 Score 0 0 0 0 0 0 0  PHQ- 9 Score  1      Fall Risk    04/27/2022   11:03 AM 01/20/2022   10:22 AM 11/09/2021   11:06 AM 04/27/2021   11:21 AM 02/10/2021    9:04 AM  Fall Risk   Falls in the past year? 1 0 1 0 0  Number falls in past yr: 1 0 1 0 0  Injury with  Fall? 1 0 1 0 0  Risk for fall due to : History of fall(s) No Fall Risks History of fall(s) No Fall Risks No Fall Risks  Follow up Falls evaluation completed;Education provided;Falls prevention discussed Falls prevention discussed Falls evaluation completed;Falls prevention discussed Falls evaluation completed Falls evaluation completed     Objective:  Katherine Nixon seemed alert and oriented and she participated appropriately during our telephone visit.  Blood Pressure Weight BMI  BP Readings from Last 3 Encounters:  01/20/22 (!) 116/51  12/02/21 (!) 142/58  11/09/21 (!) 121/51   Wt Readings from Last 3 Encounters:  01/20/22 184 lb (83.5 kg)  11/09/21 186 lb (84.4 kg)  10/09/21 173 lb (78.5 kg)   BMI Readings from Last 1 Encounters:  01/20/22 30.62 kg/m    *Unable to obtain current vital signs, weight, and BMI due to telephone visit type  Hearing/Vision  Katherine Nixon did not seem to have difficulty with hearing/understanding during the telephone conversation Reports that she has not had a formal eye exam by an eye care professional within the past year Reports that she has not had a formal hearing evaluation within the past year *Unable to fully assess hearing and vision during telephone visit type  Cognitive Function:    04/27/2022   11:11 AM 02/10/2021    9:13 AM 01/08/2020   10:10 AM  6CIT Screen  What Year? 0 points 0 points 0 points  What month? 0 points 0 points 0 points  What time? 0 points 0 points 0 points  Count back from 20 0 points 0 points 0 points  Months in reverse 0 points 0 points 0 points  Repeat phrase 0 points 0 points 0 points  Total Score 0 points 0 points 0 points   (Normal:0-7, Significant for Dysfunction: >8)  Normal Cognitive Function Screening: Yes   Immunization & Health Maintenance Record Immunization History  Administered Date(s) Administered   Fluad Quad(high Dose 65+) 11/09/2021   Influenza Split 09/01/2011   Influenza, High Dose  Seasonal PF 07/13/2017, 10/10/2018   Influenza,inj,Quad PF,6+ Mos 12/30/2014, 09/23/2015   Influenza-Unspecified 09/01/2011, 12/30/2014, 09/23/2015, 08/18/2016   PFIZER(Purple Top)SARS-COV-2 Vaccination 07/14/2020, 08/04/2020   Pneumococcal Conjugate-13 02/10/2015   Pneumococcal Polysaccharide-23 06/22/2012   Tdap 12/28/2017   Zoster, Live 05/26/2014    Health Maintenance  Topic Date Due   Zoster Vaccines- Shingrix (1 of 2) 01/21/2023 (Originally 10/22/1996)   COVID-19 Vaccine (3 - Booster for [place] series) 02/06/2023 (Originally 09/29/2020)   COLONOSCOPY (Pts 45-57yr Insurance coverage will need to be confirmed)  04/30/2022   INFLUENZA VACCINE  06/21/2022   TETANUS/TDAP  12/29/2027   Pneumonia Vaccine 76 Years old  Completed   Hepatitis C Screening  Completed   HPV VACCINES  Aged Out       Assessment  This is a routine wellness examination for Katherine Nixon  Health Maintenance: Due or Overdue There are no preventive care reminders to display for this patient.  Katherine Nixon not need a referral for Community Assistance: Care Management:   no Social Work:    no Prescription Assistance:  no  Nutrition/Diabetes Education:  no   Plan:  Personalized Goals  Goals Addressed               This Visit's Progress     Patient Stated (pt-stated)        Patient would like to loose 20 lbs.       Personalized Health Maintenance & Screening Recommendations  Shingles vaccine  Lung Cancer Screening Recommended: no (Low Dose CT Chest recommended if Age 3-80 years, 30 pack-year currently smoking OR have quit w/in past 15 years) Hepatitis C Screening recommended: no HIV Screening recommended: no  Advanced Directives: Written information was not prepared per patient's request.  Referrals & Orders No orders of the defined types were placed in this encounter.   Follow-up Plan Follow-up with Hali Marry, MD as planned Schedule your Shingrix  vaccine at your pharmacy. Medicare wellness visit in one year. Patient will access AVS on my chart.   I have personally reviewed and noted the following in the patient's chart:   Medical and social history Use of alcohol, tobacco or illicit drugs  Current medications and supplements Functional ability and status Nutritional status Physical activity Advanced directives List of other physicians Hospitalizations, surgeries, and ER visits in previous 12 months Vitals Screenings to include cognitive, depression, and falls Referrals and appointments  In addition, I have reviewed and discussed with Katherine Nixon certain preventive protocols, quality metrics, and best practice recommendations. A written personalized care plan for preventive services as well as general preventive health recommendations is available and can be mailed to the patient at her request.      Tinnie Gens, RN-BSN  04/27/2022

## 2022-05-05 ENCOUNTER — Other Ambulatory Visit: Payer: Self-pay | Admitting: *Deleted

## 2022-05-05 DIAGNOSIS — E876 Hypokalemia: Secondary | ICD-10-CM

## 2022-05-05 MED ORDER — POTASSIUM CHLORIDE CRYS ER 20 MEQ PO TBCR: 20.0000 meq | EXTENDED_RELEASE_TABLET | Freq: Every day | ORAL | 1 refills | Status: DC

## 2022-05-10 ENCOUNTER — Ambulatory Visit: Payer: Medicare Other | Admitting: Family Medicine

## 2022-05-25 NOTE — Progress Notes (Unsigned)
   Established Patient Office Visit  Subjective   Patient ID: Katherine Nixon, female    DOB: [birth year]  Age: 76 y.o. MRN: [id number]  No chief complaint on file.   HPI  Hypertension- Pt denies chest pain, SOB, dizziness, or heart palpitations.  Taking meds as directed w/o problems.  Denies medication side effects.     {History (Optional):23778}  ROS    Objective:     There were no vitals taken for this visit. {Vitals History (Optional):23777}  Physical Exam   No results found for any visits on 05/26/22.  {Labs (Optional):23779}  The 10-year ASCVD risk score (Arnett DK, et al., 2019) is: 21.4%    Assessment & Plan:   Problem List Items Addressed This Visit       Cardiovascular and Mediastinum   Essential hypertension, benign - Primary     Other   Hyperlipemia   Elevated alkaline phosphatase level    No follow-ups on file.    Beatrice Lecher, MD

## 2022-05-26 ENCOUNTER — Ambulatory Visit (INDEPENDENT_AMBULATORY_CARE_PROVIDER_SITE_OTHER): Payer: Medicare Other | Admitting: Family Medicine

## 2022-05-26 ENCOUNTER — Encounter: Payer: Self-pay | Admitting: Family Medicine

## 2022-05-26 VITALS — BP 125/44 | HR 58 | Wt 192.0 lb

## 2022-05-26 DIAGNOSIS — I10 Essential (primary) hypertension: Secondary | ICD-10-CM | POA: Diagnosis not present

## 2022-05-26 DIAGNOSIS — R748 Abnormal levels of other serum enzymes: Secondary | ICD-10-CM

## 2022-05-26 DIAGNOSIS — M25472 Effusion, left ankle: Secondary | ICD-10-CM | POA: Diagnosis not present

## 2022-05-26 DIAGNOSIS — R7309 Other abnormal glucose: Secondary | ICD-10-CM

## 2022-05-26 DIAGNOSIS — E785 Hyperlipidemia, unspecified: Secondary | ICD-10-CM

## 2022-05-26 DIAGNOSIS — M329 Systemic lupus erythematosus, unspecified: Secondary | ICD-10-CM

## 2022-05-26 MED ORDER — AMLODIPINE BESYLATE 5 MG PO TABS: 5.0000 mg | ORAL_TABLET | Freq: Every day | ORAL | 3 refills | Status: DC

## 2022-05-26 NOTE — Assessment & Plan Note (Signed)
Started on Cymbalta back in 2016 to help with her pain related to her lupus.  She feels like it still helpful and is happy with her current dose and regimen.

## 2022-05-26 NOTE — Assessment & Plan Note (Signed)
Due to recheck lipids. 

## 2022-05-26 NOTE — Assessment & Plan Note (Signed)
Well controlled. Continue current regimen. Follow up in  6 mo  

## 2022-05-27 LAB — COMPLETE METABOLIC PANEL WITH GFR
AG Ratio: 1.3 (calc) (ref 1.0–2.5)
ALT: 19 U/L (ref 6–29)
AST: 28 U/L (ref 10–35)
Albumin: 4.1 g/dL (ref 3.6–5.1)
Alkaline phosphatase (APISO): 109 U/L (ref 37–153)
BUN/Creatinine Ratio: 23 (calc) — ABNORMAL HIGH (ref 6–22)
BUN: 23 mg/dL (ref 7–25)
CO2: 25 mmol/L (ref 20–32)
Calcium: 10.2 mg/dL (ref 8.6–10.4)
Chloride: 107 mmol/L (ref 98–110)
Creat: 1.02 mg/dL — ABNORMAL HIGH (ref 0.60–1.00)
Globulin: 3.1 g/dL (calc) (ref 1.9–3.7)
Glucose, Bld: 83 mg/dL (ref 65–99)
Potassium: 4.4 mmol/L (ref 3.5–5.3)
Sodium: 141 mmol/L (ref 135–146)
Total Bilirubin: 0.4 mg/dL (ref 0.2–1.2)
Total Protein: 7.2 g/dL (ref 6.1–8.1)
eGFR: 57 mL/min/{1.73_m2} — ABNORMAL LOW (ref >=60)

## 2022-05-27 LAB — LIPID PANEL W/REFLEX DIRECT LDL
Cholesterol: 158 mg/dL (ref <200)
HDL: 69 mg/dL (ref >=50)
LDL Cholesterol (Calc): 74 mg/dL (calc)
Non-HDL Cholesterol (Calc): 89 mg/dL (calc) (ref <130)
Total CHOL/HDL Ratio: 2.3 (calc) (ref <5.0)
Triglycerides: 73 mg/dL (ref <150)

## 2022-05-27 LAB — CBC
HCT: 36.3 % (ref 35.0–45.0)
Hemoglobin: 11.9 g/dL (ref 11.7–15.5)
MCH: 31.6 pg (ref 27.0–33.0)
MCHC: 32.8 g/dL (ref 32.0–36.0)
MCV: 96.5 fL (ref 80.0–100.0)
MPV: 10.1 fL (ref 7.5–12.5)
Platelets: 179 10*3/uL (ref 140–400)
RBC: 3.76 10*6/uL — ABNORMAL LOW (ref 3.80–5.10)
RDW: 13.3 % (ref 11.0–15.0)
WBC: 6.1 10*3/uL (ref 3.8–10.8)

## 2022-05-27 LAB — HEMOGLOBIN A1C
Hgb A1c MFr Bld: 5.6 % of total Hgb (ref <5.7)
Mean Plasma Glucose: 114 mg/dL
eAG (mmol/L): 6.3 mmol/L

## 2022-05-27 NOTE — Progress Notes (Signed)
Hi Katherine Nixon, kidney function is stable at 1.0.  So it does look a little bit better than it did 4 months ago I think you are a little bit more dry on your blood work last time and this time you do look better hydrated.  Potassium is also normal.  Liver enzymes are normal.  Blood count looks good.  Cholesterol looks great!  Hemoglobin A1c also looks great.

## 2022-06-29 DIAGNOSIS — R197 Diarrhea, unspecified: Secondary | ICD-10-CM | POA: Diagnosis not present

## 2022-06-29 DIAGNOSIS — K52831 Collagenous colitis: Secondary | ICD-10-CM | POA: Diagnosis not present

## 2022-06-29 LAB — HM COLONOSCOPY

## 2022-07-20 ENCOUNTER — Other Ambulatory Visit: Payer: Self-pay | Admitting: Family Medicine

## 2022-07-20 DIAGNOSIS — K5904 Chronic idiopathic constipation: Secondary | ICD-10-CM

## 2022-08-19 ENCOUNTER — Encounter: Payer: Self-pay | Admitting: Family Medicine

## 2022-09-01 DIAGNOSIS — K52831 Collagenous colitis: Secondary | ICD-10-CM | POA: Diagnosis not present

## 2022-09-21 DIAGNOSIS — M19211 Secondary osteoarthritis, right shoulder: Secondary | ICD-10-CM | POA: Diagnosis not present

## 2022-10-07 DIAGNOSIS — I10 Essential (primary) hypertension: Secondary | ICD-10-CM | POA: Diagnosis not present

## 2022-10-07 DIAGNOSIS — Z79899 Other long term (current) drug therapy: Secondary | ICD-10-CM | POA: Diagnosis not present

## 2022-10-07 DIAGNOSIS — S66306A Unspecified injury of extensor muscle, fascia and tendon of right little finger at wrist and hand level, initial encounter: Secondary | ICD-10-CM | POA: Diagnosis not present

## 2022-10-07 DIAGNOSIS — Z882 Allergy status to sulfonamides status: Secondary | ICD-10-CM | POA: Diagnosis not present

## 2022-10-07 DIAGNOSIS — W010XXA Fall on same level from slipping, tripping and stumbling without subsequent striking against object, initial encounter: Secondary | ICD-10-CM | POA: Diagnosis not present

## 2022-10-07 DIAGNOSIS — Z888 Allergy status to other drugs, medicaments and biological substances status: Secondary | ICD-10-CM | POA: Diagnosis not present

## 2022-10-07 DIAGNOSIS — M66242 Spontaneous rupture of extensor tendons, left hand: Secondary | ICD-10-CM | POA: Diagnosis not present

## 2022-10-07 DIAGNOSIS — S0101XA Laceration without foreign body of scalp, initial encounter: Secondary | ICD-10-CM | POA: Diagnosis not present

## 2022-10-07 DIAGNOSIS — Z881 Allergy status to other antibiotic agents status: Secondary | ICD-10-CM | POA: Diagnosis not present

## 2022-10-07 DIAGNOSIS — S8992XA Unspecified injury of left lower leg, initial encounter: Secondary | ICD-10-CM | POA: Diagnosis not present

## 2022-10-13 ENCOUNTER — Other Ambulatory Visit: Payer: Self-pay | Admitting: Family Medicine

## 2022-10-13 DIAGNOSIS — G4700 Insomnia, unspecified: Secondary | ICD-10-CM

## 2022-10-14 ENCOUNTER — Other Ambulatory Visit: Payer: Self-pay | Admitting: Family Medicine

## 2022-10-14 DIAGNOSIS — E876 Hypokalemia: Secondary | ICD-10-CM

## 2022-10-17 ENCOUNTER — Encounter: Payer: Self-pay | Admitting: Medical-Surgical

## 2022-10-17 ENCOUNTER — Ambulatory Visit (INDEPENDENT_AMBULATORY_CARE_PROVIDER_SITE_OTHER): Payer: Medicare Other | Admitting: Medical-Surgical

## 2022-10-17 VITALS — BP 138/70 | HR 64 | Resp 20 | Ht 65.0 in | Wt 192.0 lb

## 2022-10-17 DIAGNOSIS — Z4802 Encounter for removal of sutures: Secondary | ICD-10-CM

## 2022-10-17 NOTE — Progress Notes (Signed)
   Established Patient Office Visit  Subjective   Patient ID: Katherine Nixon, female   DOB: 03/04/1946 Age: 76 y.o. MRN: [id number]   Chief Complaint  Patient presents with   Suture / Staple Removal    HPI Pleasant 76 year old female presenting today for removal of staples from her head after being seen in the ED on 10/07/2022 for a fall. She had been walking her dog and tripped over the leash, falling and hitting her head on cement. She had 4 staples placed in the ED and was instructed to see her PCP in 5 days for removal. Today, she presents for removal of staples. She has significant bruising to her right forehead and face with some yellowing around the edges.  Has an intact scab on her right forehead.  Has 4 intact staples to the irregular laceration on the top of her scalp on the left side.  Some tenderness to the facial bruising however no further pain or concerning symptoms to report today.   Objective:    Vitals:   10/17/22 1500  BP: 138/70  Pulse: 64  Resp: 20  Height: '5\' 5"'$  (1.651 m)  Weight: 192 lb (87.1 kg)  SpO2: 95%  BMI (Calculated): 31.95    Physical Exam Vitals and nursing note reviewed.  Constitutional:      General: She is not in acute distress.    Appearance: Normal appearance. She is not ill-appearing.  HENT:     Head: Normocephalic and atraumatic.  Cardiovascular:     Rate and Rhythm: Normal rate and regular rhythm.     Pulses: Normal pulses.     Heart sounds: Normal heart sounds.  Pulmonary:     Effort: Pulmonary effort is normal. No respiratory distress.     Breath sounds: Normal breath sounds. No wheezing, rhonchi or rales.  Skin:    General: Skin is warm and dry.     Findings: Ecchymosis (Right facial/forehead) and signs of injury (Top of scalp on the left) present.  Neurological:     Mental Status: She is alert and oriented to person, place, and time.  Psychiatric:        Mood and Affect: Mood normal.        Behavior: Behavior normal.         Thought Content: Thought content normal.        Judgment: Judgment normal.   No results found for this or any previous visit (from the past 24 hour(s)).     The 10-year ASCVD risk score (Arnett DK, et al., 2019) is: 23.7%   Values used to calculate the score:     Age: 1 years     Sex: Female     Is Non-Hispanic African American: No     Diabetic: No     Tobacco smoker: No     Systolic Blood Pressure: 782 mmHg     Is BP treated: Yes     HDL Cholesterol: 69 mg/dL     Total Cholesterol: 158 mg/dL   Assessment & Plan:   1. Encounter for staple removal Site assessed and evaluated.  Wound edges well approximated with scabbing in place.  No erythema, drainage, or fluctuance.  4 staples successfully removed without difficulty.  Patient tolerated well.  Small amount of triple antibiotic ointment applied and wound care instructions provided.  Return if symptoms worsen or fail to improve.  ___________________________________________ Clearnce Sorrel, DNP, APRN, FNP-BC [facility] [place]

## 2022-10-19 ENCOUNTER — Telehealth: Payer: Self-pay | Admitting: Neurology

## 2022-10-19 NOTE — Telephone Encounter (Signed)
Received request for medical clearance on patient. Needs appt. Has not been seen since July. Please call and schedule for preop clearance with Dr. Madilyn Fireman. Thanks!  Dr. Madilyn Fireman - FYI. Clearance form in your basket to hold until scheduled.

## 2022-10-27 ENCOUNTER — Ambulatory Visit (INDEPENDENT_AMBULATORY_CARE_PROVIDER_SITE_OTHER): Payer: Medicare Other | Admitting: Family Medicine

## 2022-10-27 ENCOUNTER — Encounter: Payer: Self-pay | Admitting: Family Medicine

## 2022-10-27 VITALS — BP 140/63 | HR 66 | Ht 65.0 in | Wt 202.0 lb

## 2022-10-27 DIAGNOSIS — Z23 Encounter for immunization: Secondary | ICD-10-CM

## 2022-10-27 DIAGNOSIS — Z01818 Encounter for other preprocedural examination: Secondary | ICD-10-CM | POA: Diagnosis not present

## 2022-10-27 NOTE — Progress Notes (Signed)
Established Patient Office Visit  Subjective   Patient ID: Katherine Nixon, female    DOB: 1946/06/07  Age: 76 y.o. MRN: [id number]  Chief Complaint  Patient presents with   Pre-op Exam         HPI  76 yo female here today pre-operative clearance for right reverse total shoulder arthroplasty with Dr. Tamera Punt.  He is doing well overall.  She actually had a recent fall and suffered a scalp laceration and hematoma to the right side of her head and face.  She also had a traumatic rupture of her extensor tendon on one of her fingers.  He is doing well overall.  She actually had a recent fall and suffered a scalp laceration and hematoma to the right side of her head and face.  She also had a traumatic rupture of her extensor tendon on one of her fingers.  She is getting better and healing.  She has had all staples removed.  Denies any recent chest pain or shortness of breath.  She is able to walk without significant difficulty.  She is going to be helping a friend after their surgery so she is likely going to end up pushing the surgery out until January.  She has never had problems or complications with anesthesia.    ROS    Objective:     BP (!) 140/63   Pulse 66   Ht '5\' 5"'$  (1.651 m)   Wt 202 lb (91.6 kg)   SpO2 96%   BMI 33.61 kg/m    Physical Exam Vitals and nursing note reviewed.  Constitutional:      Appearance: She is well-developed.  HENT:     Head: Normocephalic and atraumatic.     Right Ear: Tympanic membrane, ear canal and external ear normal.     Left Ear: Tympanic membrane, ear canal and external ear normal.     Nose: Nose normal.     Mouth/Throat:     Pharynx: Oropharynx is clear.  Eyes:     Conjunctiva/sclera: Conjunctivae normal.     Pupils: Pupils are equal, round, and reactive to light.  Neck:     Thyroid: No thyromegaly.  Cardiovascular:     Rate and Rhythm: Normal rate and regular rhythm.     Heart sounds: Normal heart sounds.  Pulmonary:      Effort: Pulmonary effort is normal.     Breath sounds: Normal breath sounds. No wheezing.  Abdominal:     General: Bowel sounds are normal.     Palpations: Abdomen is soft.  Musculoskeletal:     Cervical back: Neck supple.  Lymphadenopathy:     Cervical: No cervical adenopathy.  Skin:    General: Skin is warm and dry.     Coloration: Skin is not pale.  Neurological:     Mental Status: She is alert and oriented to person, place, and time.  Psychiatric:        Behavior: Behavior normal.      No results found for any visits on 10/27/22.    The 10-year ASCVD risk score (Arnett DK, et al., 2019) is: 27.2%    Assessment & Plan:   Problem List Items Addressed This Visit   None Visit Diagnoses     Preop testing    -  Primary   Relevant Orders   EKG 12-Lead   Need for immunization against influenza       Relevant Orders   Flu Vaccine QUAD High Dose(Fluad) (Completed)  She is currently cleared for surgery.  Because she will likely delay surgery until January we did not get any blood work today and wanted to make sure that she had her labs done within the 30-day window.  She is to hold all products that could thin the blood including over-the-counter aspirin, fish oil, ginseng, NSAIDs 1 week prior to surgery.  EKG today shows normal sinus rhythm with a PAC, borderline for LVH.  No acute changes.   Forms completed and copy of note sent to [facility] orthopedic and sports medicine, attention Marcelo Baldy, scheduler.  Return if symptoms worsen or fail to improve.   I spent 30 minutes on the day of the encounter to include pre-visit record review, face-to-face time with the patient and post visit ordering of test.  Beatrice Lecher, MD

## 2022-10-28 ENCOUNTER — Telehealth: Payer: Self-pay | Admitting: *Deleted

## 2022-10-28 NOTE — Telephone Encounter (Signed)
Form completed,faxed,confirmation received and scanned into patient's chart.

## 2022-11-18 DIAGNOSIS — M20012 Mallet finger of left finger(s): Secondary | ICD-10-CM | POA: Diagnosis not present

## 2022-11-28 ENCOUNTER — Ambulatory Visit: Payer: Medicare Other | Admitting: Family Medicine

## 2022-11-29 DIAGNOSIS — M20012 Mallet finger of left finger(s): Secondary | ICD-10-CM | POA: Diagnosis not present

## 2022-12-27 DIAGNOSIS — M20012 Mallet finger of left finger(s): Secondary | ICD-10-CM | POA: Diagnosis not present

## 2022-12-29 DIAGNOSIS — M20012 Mallet finger of left finger(s): Secondary | ICD-10-CM | POA: Diagnosis not present

## 2023-01-10 DIAGNOSIS — M20012 Mallet finger of left finger(s): Secondary | ICD-10-CM | POA: Diagnosis not present

## 2023-01-23 ENCOUNTER — Telehealth: Payer: Self-pay | Admitting: *Deleted

## 2023-01-23 NOTE — Telephone Encounter (Signed)
K, have they sent [place] a new form?  I do not think of seeing anything.

## 2023-01-23 NOTE — Telephone Encounter (Signed)
Pt had her surgical clearance done back in December and due to family issues she had to postpone this. She was told that she would need to get another pre-op clearance done. She reports that she has not had any changes.   Will fwd to pcp for review and advice.

## 2023-01-24 DIAGNOSIS — M20012 Mallet finger of left finger(s): Secondary | ICD-10-CM | POA: Diagnosis not present

## 2023-01-25 NOTE — Telephone Encounter (Signed)
I advised to patient to have the office fax a new form. She agreed to call them.

## 2023-01-31 DIAGNOSIS — M20012 Mallet finger of left finger(s): Secondary | ICD-10-CM | POA: Diagnosis not present

## 2023-02-01 ENCOUNTER — Telehealth: Payer: Self-pay

## 2023-02-01 NOTE — Telephone Encounter (Signed)
Patient came into office to drop off form to be completed by Dr.Metheney for medical clearance for shoulder replacement, form placed in Dr.Metheneys' box,thanks.

## 2023-02-07 DIAGNOSIS — M20012 Mallet finger of left finger(s): Secondary | ICD-10-CM | POA: Diagnosis not present

## 2023-02-10 NOTE — Telephone Encounter (Signed)
Form completed and placed in Katherine Nixon basket. 

## 2023-02-10 NOTE — Telephone Encounter (Signed)
Form completed,faxed,confirmation received and scanned into patient's chart. 

## 2023-02-14 DIAGNOSIS — M20012 Mallet finger of left finger(s): Secondary | ICD-10-CM | POA: Diagnosis not present

## 2023-02-16 ENCOUNTER — Other Ambulatory Visit: Payer: Self-pay | Admitting: Orthopedic Surgery

## 2023-02-22 NOTE — Patient Instructions (Signed)
SURGICAL WAITING ROOM VISITATION  Patients having surgery or a procedure may have no more than 2 support people in the waiting area - these visitors may rotate.    Children under the age of 42 must have an adult with them who is not the patient.  Due to an increase in RSV and influenza rates and associated hospitalizations, children ages 1 and under may not visit patients in [place].  If the patient needs to stay at the hospital during part of their recovery, the visitor guidelines for inpatient rooms apply. Pre-op nurse will coordinate an appropriate time for 1 support person to accompany patient in pre-op.  This support person may not rotate.    Please refer to the [facility] website for the visitor guidelines for Inpatients (after your surgery is over and you are in a regular room).    Your procedure is scheduled on: 03/09/23   Report to [facility] Main Entrance    Report to admitting at 5:15 AM   Call this number if you have problems the morning of surgery [phone]   Do not eat food :After Midnight.   After Midnight you may have the following liquids until 4:30 AM DAY OF SURGERY  Water Non-Citrus Juices (without pulp, NO RED-Apple, White grape, White cranberry) Black Coffee (NO MILK/CREAM OR CREAMERS, sugar ok)  Clear Tea (NO MILK/CREAM OR CREAMERS, sugar ok) regular and decaf                             Plain Jell-O (NO RED)                                           Fruit ices (not with fruit pulp, NO RED)                                     Popsicles (NO RED)                                                               Sports drinks like Gatorade (NO RED)                 The day of surgery:  Drink ONE (1) Pre-Surgery Clear Ensure or G2 at AM the morning of surgery. Drink in one sitting. Do not sip.  This drink was given to you during your hospital  pre-op appointment visit. Nothing else to drink after completing the  Pre-Surgery Clear Ensure  or G2.          If you have questions, please contact your surgeon's office.   FOLLOW BOWEL PREP AND ANY ADDITIONAL PRE OP INSTRUCTIONS YOU RECEIVED FROM YOUR SURGEON'S OFFICE!!!     Oral Hygiene is also important to reduce your risk of infection.                                    Remember - BRUSH YOUR TEETH THE MORNING OF SURGERY WITH YOUR REGULAR TOOTHPASTE  DENTURES WILL BE REMOVED PRIOR TO SURGERY PLEASE DO NOT APPLY "Poly grip" OR ADHESIVES!!!   Do NOT smoke after Midnight   Take these medicines the morning of surgery with A SIP OF WATER: Amlodipine, Duloxetine   DO NOT TAKE ANY ORAL DIABETIC MEDICATIONS DAY OF YOUR SURGERY  Bring CPAP mask and tubing day of surgery.                              You may not have any metal on your body including hair pins, jewelry, and body piercing             Do not wear make-up, lotions, powders, perfumes, or deodorant  Do not wear nail polish including gel and S&S, artificial/acrylic nails, or any other type of covering on natural nails including finger and toenails. If you have artificial nails, gel coating, etc. that needs to be removed by a nail salon please have this removed prior to surgery or surgery may need to be canceled/ delayed if the surgeon/ anesthesia feels like they are unable to be safely monitored.   Do not shave  48 hours prior to surgery.    Do not bring valuables to the hospital. [place].   Contacts, glasses, dentures or bridgework may not be worn into surgery.   DO NOT [place]. PHARMACY WILL DISPENSE MEDICATIONS LISTED ON YOUR MEDICATION LIST TO YOU DURING YOUR ADMISSION [place]!    Patients discharged on the day of surgery will not be allowed to drive home.  Someone NEEDS to stay with you for the first 24 hours after anesthesia.   Special Instructions: Bring a copy of your healthcare power of attorney and living will  documents the day of surgery if you haven't scanned them before.              Please read over the following fact sheets you were given: [place] [phone] Schneiders   If you received a COVID test during your pre-op visit  it is requested that you wear a mask when out in public, stay away from anyone that may not be feeling well and notify your surgeon if you develop symptoms. If you test positive for Covid or have been in contact with anyone that has tested positive in the last 10 days please notify you surgeon.    [place] - Preparing for Surgery Before surgery, you can play an important role.  Because skin is not sterile, your skin needs to be as free of germs as possible.  You can reduce the number of germs on your skin by washing with CHG (chlorahexidine gluconate) soap before surgery.  CHG is an antiseptic cleaner which kills germs and bonds with the skin to continue killing germs even after washing. Please DO NOT use if you have an allergy to CHG or antibacterial soaps.  If your skin becomes reddened/irritated stop using the CHG and inform your nurse when you arrive at Short Stay. Do not shave (including legs and underarms) for at least 48 hours prior to the first CHG shower.  You may shave your face/neck.  Please follow these instructions carefully:  1.  Shower with CHG Soap the night before surgery and the  morning of surgery.  2.  If you choose to wash your hair, wash your hair first as usual with your normal  shampoo.  3.  After you shampoo, rinse your hair and body thoroughly to remove the shampoo.                             4.  Use CHG as you would any other liquid soap.  You can apply chg directly to the skin and wash.  Gently with a scrungie or clean washcloth.  5.  Apply the CHG Soap to your body ONLY FROM THE NECK DOWN.   Do   not use on face/ open                           Wound or open sores. Avoid contact with eyes,  ears mouth and   genitals (private parts).                       Wash face,  Genitals (private parts) with your normal soap.             6.  Wash thoroughly, paying special attention to the area where your    surgery  will be performed.  7.  Thoroughly rinse your body with warm water from the neck down.  8.  DO NOT shower/wash with your normal soap after using and rinsing off the CHG Soap.                9.  Pat yourself dry with a clean towel.            10.  Wear clean pajamas.            11.  Place clean sheets on your bed the night of your first shower and do not  sleep with pets. Day of Surgery : Do not apply any lotions/deodorants the morning of surgery.  Please wear clean clothes to the hospital/surgery center.  FAILURE TO FOLLOW THESE INSTRUCTIONS MAY RESULT IN THE CANCELLATION OF YOUR SURGERY  PATIENT SIGNATURE_________________________________  NURSE SIGNATURE__________________________________  ________________________________________________________________________

## 2023-02-22 NOTE — Progress Notes (Addendum)
COVID Vaccine Completed: yes  Date of COVID positive in last 42 days:no  PCP - Beatrice Lecher, MD Cardiologist - [place] clearance by Beatrice Lecher 10/27/22 in Epic  Chest x-ray - 02/23/23 Epic EKG - 10/27/22 Epic Stress Test - 40 years ago per pt ECHO - 01/09/19 Epic Cardiac Cath - n/a Pacemaker/ICD device last checked: n/a Spinal Cord Stimulator: n/a  Bowel Prep - no  Sleep Study - n/a CPAP -   Fasting Blood Sugar -  n/a Checks Blood Sugar _____ times a day  Last dose of GLP1 agonist-  N/A GLP1 instructions:  N/A   Last dose of SGLT-2 inhibitors-  N/A SGLT-2 instructions: N/A   Blood Thinner Instructions: n/a Aspirin Instructions: Last Dose:  Activity level: Can go up a flight of stairs and perform activities of daily living without stopping and without symptoms of chest pain or shortness of breath.   Anesthesia review:   Patient denies shortness of breath, fever, cough and chest pain at PAT appointment  Patient verbalized understanding of instructions that were given to them at the PAT appointment. Patient was also instructed that they will need to review over the PAT instructions again at home before surgery.

## 2023-02-22 NOTE — Progress Notes (Signed)
Please place orders for PAT appointment scheduled 02/23/23.

## 2023-02-23 ENCOUNTER — Encounter (HOSPITAL_COMMUNITY): Payer: Self-pay

## 2023-02-23 ENCOUNTER — Other Ambulatory Visit: Payer: Self-pay

## 2023-02-23 ENCOUNTER — Encounter (HOSPITAL_COMMUNITY)
Admission: RE | Admit: 2023-02-23 | Discharge: 2023-02-23 | Disposition: A | Discharge status: Home / Self Care | Payer: Medicare Other | Source: Ambulatory Visit | Attending: Orthopedic Surgery | Admitting: Orthopedic Surgery

## 2023-02-23 ENCOUNTER — Ambulatory Visit (HOSPITAL_COMMUNITY)
Admission: RE | Admit: 2023-02-23 | Discharge: 2023-02-23 | Disposition: A | Discharge status: Home / Self Care | Payer: Medicare Other | Source: Ambulatory Visit | Attending: Orthopedic Surgery | Admitting: Orthopedic Surgery

## 2023-02-23 DIAGNOSIS — Z01818 Encounter for other preprocedural examination: Secondary | ICD-10-CM | POA: Insufficient documentation

## 2023-02-23 DIAGNOSIS — I10 Essential (primary) hypertension: Secondary | ICD-10-CM | POA: Diagnosis not present

## 2023-02-23 HISTORY — DX: Gastro-esophageal reflux disease without esophagitis: K21.9

## 2023-02-23 LAB — CBC
HCT: 37.9 % (ref 36.0–46.0)
Hemoglobin: 12 g/dL (ref 12.0–15.0)
MCH: 32 pg (ref 26.0–34.0)
MCHC: 31.7 g/dL (ref 30.0–36.0)
MCV: 101.1 fL — ABNORMAL HIGH (ref 80.0–100.0)
Platelets: 219 10*3/uL (ref 150–400)
RBC: 3.75 MIL/uL — ABNORMAL LOW (ref 3.87–5.11)
RDW: 15.9 % — ABNORMAL HIGH (ref 11.5–15.5)
WBC: 6.8 10*3/uL (ref 4.0–10.5)
nRBC: 0.3 % — ABNORMAL HIGH (ref 0.0–0.2)

## 2023-02-23 LAB — BASIC METABOLIC PANEL
Anion gap: 9 (ref 5–15)
BUN: 21 mg/dL (ref 8–23)
CO2: 26 mmol/L (ref 22–32)
Calcium: 9.8 mg/dL (ref 8.9–10.3)
Chloride: 104 mmol/L (ref 98–111)
Creatinine, Ser: 1 mg/dL (ref 0.44–1.00)
GFR, Estimated: 58 mL/min — ABNORMAL LOW (ref >60)
Glucose, Bld: 95 mg/dL (ref 70–99)
Potassium: 5.2 mmol/L — ABNORMAL HIGH (ref 3.5–5.1)
Sodium: 139 mmol/L (ref 135–145)

## 2023-02-23 LAB — SURGICAL PCR SCREEN
MRSA, PCR: NEGATIVE
Staphylococcus aureus: NEGATIVE

## 2023-03-01 NOTE — Care Plan (Signed)
Ortho Bundle Case Management Note  Patient Details  Name: Muskan Down MRN: [id number] Date of Birth: 03/21/1946   Patient will discharge to home with family to assist. No DME or therapy needed at this time.  Patient and MD in agreement with plan.                  DME Arranged:    DME Agency:     HH Arranged:    HH Agency:     Additional Comments: Please contact me with any questions of if this plan should need to change.  Shauna Hugh,  RN,BSN,MHA,CCM  [facility] Orthopaedic Specialist  [phone] 03/01/2023, 11:56 AM

## 2023-03-03 DIAGNOSIS — M20012 Mallet finger of left finger(s): Secondary | ICD-10-CM | POA: Diagnosis not present

## 2023-03-07 ENCOUNTER — Other Ambulatory Visit: Payer: Self-pay | Admitting: Family Medicine

## 2023-03-07 DIAGNOSIS — I10 Essential (primary) hypertension: Secondary | ICD-10-CM

## 2023-03-08 ENCOUNTER — Encounter (HOSPITAL_COMMUNITY): Payer: Self-pay | Admitting: Orthopedic Surgery

## 2023-03-08 NOTE — Anesthesia Preprocedure Evaluation (Addendum)
Anesthesia Evaluation  Patient identified by MRN, date of birth, ID band Patient awake    Reviewed: Allergy & Precautions, NPO status , Patient's Chart, lab work & pertinent test results, reviewed documented beta blocker date and time   History of Anesthesia Complications (+) history of anesthetic complications  Airway Mallampati: II  TM Distance: >3 FB Neck ROM: Full    Dental  (+) Dental Advisory Given   Pulmonary former smoker   Pulmonary exam normal breath sounds clear to auscultation       Cardiovascular hypertension, Pt. on medications Normal cardiovascular exam+ dysrhythmias  Rhythm:Regular Rate:Normal     Neuro/Psych  Headaches PSYCHIATRIC DISORDERS Anxiety Depression     Neuromuscular disease    GI/Hepatic Neg liver ROS,GERD  Medicated,,IBS   Endo/Other  Hyperlipidemia Obesity  Renal/GU negative Renal ROS   Urinary incontinenece    Musculoskeletal  (+) Arthritis , Osteoarthritis,  OA right shoulder DDD lumbar spine   Abdominal  (+) + obese  Peds  Hematology negative hematology ROS (+)   Anesthesia Other Findings   Reproductive/Obstetrics                              Anesthesia Physical Anesthesia Plan  ASA: 3  Anesthesia Plan: General and Regional   Post-op Pain Management: Minimal or no pain anticipated   Induction: Intravenous  PONV Risk Score and Plan: 4 or greater and Treatment may vary due to age or medical condition  Airway Management Planned: Oral ETT  Additional Equipment: None  Intra-op Plan:   Post-operative Plan: Extubation in OR  Informed Consent: I have reviewed the patients History and Physical, chart, labs and discussed the procedure including the risks, benefits and alternatives for the proposed anesthesia with the patient or authorized representative who has indicated his/her understanding and acceptance.     Dental advisory given  Plan  Discussed with: Anesthesiologist and CRNA  Anesthesia Plan Comments:          Anesthesia Quick Evaluation

## 2023-03-09 ENCOUNTER — Encounter (HOSPITAL_COMMUNITY)
Admission: RE | Disposition: A | Discharge status: Home / Self Care | Payer: Self-pay | Source: Ambulatory Visit | Attending: Orthopedic Surgery

## 2023-03-09 ENCOUNTER — Ambulatory Visit (HOSPITAL_COMMUNITY): Payer: Medicare Other | Admitting: Anesthesiology

## 2023-03-09 ENCOUNTER — Encounter (HOSPITAL_COMMUNITY): Payer: Self-pay | Admitting: Orthopedic Surgery

## 2023-03-09 ENCOUNTER — Ambulatory Visit (HOSPITAL_BASED_OUTPATIENT_CLINIC_OR_DEPARTMENT_OTHER): Payer: Medicare Other | Admitting: Anesthesiology

## 2023-03-09 ENCOUNTER — Ambulatory Visit (HOSPITAL_COMMUNITY)
Admission: RE | Admit: 2023-03-09 | Discharge: 2023-03-09 | Disposition: A | Discharge status: Home / Self Care | Payer: Medicare Other | Source: Ambulatory Visit | Attending: Orthopedic Surgery | Admitting: Orthopedic Surgery

## 2023-03-09 ENCOUNTER — Other Ambulatory Visit: Payer: Self-pay

## 2023-03-09 DIAGNOSIS — Z87891 Personal history of nicotine dependence: Secondary | ICD-10-CM | POA: Insufficient documentation

## 2023-03-09 DIAGNOSIS — M19011 Primary osteoarthritis, right shoulder: Secondary | ICD-10-CM | POA: Diagnosis not present

## 2023-03-09 DIAGNOSIS — R519 Headache, unspecified: Secondary | ICD-10-CM | POA: Insufficient documentation

## 2023-03-09 DIAGNOSIS — M75101 Unspecified rotator cuff tear or rupture of right shoulder, not specified as traumatic: Secondary | ICD-10-CM | POA: Diagnosis not present

## 2023-03-09 DIAGNOSIS — M75121 Complete rotator cuff tear or rupture of right shoulder, not specified as traumatic: Secondary | ICD-10-CM | POA: Diagnosis not present

## 2023-03-09 DIAGNOSIS — E785 Hyperlipidemia, unspecified: Secondary | ICD-10-CM | POA: Diagnosis not present

## 2023-03-09 DIAGNOSIS — K219 Gastro-esophageal reflux disease without esophagitis: Secondary | ICD-10-CM | POA: Insufficient documentation

## 2023-03-09 DIAGNOSIS — E669 Obesity, unspecified: Secondary | ICD-10-CM | POA: Insufficient documentation

## 2023-03-09 DIAGNOSIS — K589 Irritable bowel syndrome without diarrhea: Secondary | ICD-10-CM | POA: Diagnosis not present

## 2023-03-09 DIAGNOSIS — F32A Depression, unspecified: Secondary | ICD-10-CM | POA: Diagnosis not present

## 2023-03-09 DIAGNOSIS — F419 Anxiety disorder, unspecified: Secondary | ICD-10-CM | POA: Diagnosis not present

## 2023-03-09 DIAGNOSIS — Z01818 Encounter for other preprocedural examination: Secondary | ICD-10-CM

## 2023-03-09 DIAGNOSIS — M5136 Other intervertebral disc degeneration, lumbar region: Secondary | ICD-10-CM | POA: Diagnosis not present

## 2023-03-09 DIAGNOSIS — I10 Essential (primary) hypertension: Secondary | ICD-10-CM

## 2023-03-09 DIAGNOSIS — M12811 Other specific arthropathies, not elsewhere classified, right shoulder: Secondary | ICD-10-CM | POA: Diagnosis not present

## 2023-03-09 DIAGNOSIS — Z6834 Body mass index (BMI) 34.0-34.9, adult: Secondary | ICD-10-CM | POA: Insufficient documentation

## 2023-03-09 DIAGNOSIS — G8918 Other acute postprocedural pain: Secondary | ICD-10-CM | POA: Diagnosis not present

## 2023-03-09 DIAGNOSIS — Z96611 Presence of right artificial shoulder joint: Secondary | ICD-10-CM | POA: Diagnosis not present

## 2023-03-09 HISTORY — PX: REVERSE SHOULDER ARTHROPLASTY: SHX5054

## 2023-03-09 SURGERY — ARTHROPLASTY, SHOULDER, TOTAL, REVERSE
Anesthesia: Regional | Site: Shoulder | Laterality: Right

## 2023-03-09 MED ORDER — OXYCODONE HCL 5 MG/5ML PO SOLN: 5.0000 mg | Freq: Once | ORAL | Status: DC | PRN

## 2023-03-09 MED ORDER — ALBUTEROL SULFATE (2.5 MG/3ML) 0.083% IN NEBU
2.5000 mg | INHALATION_SOLUTION | Freq: Four times a day (QID) | RESPIRATORY_TRACT | Status: DC | PRN
  Administered 2023-03-09: 2.5 mg via RESPIRATORY_TRACT

## 2023-03-09 MED ORDER — SODIUM CHLORIDE 0.9 % IR SOLN
Status: DC | PRN
  Administered 2023-03-09: 1000 mL

## 2023-03-09 MED ORDER — PHENYLEPHRINE HCL-NACL 20-0.9 MG/250ML-% IV SOLN
INTRAVENOUS | Status: DC | PRN
  Administered 2023-03-09: 50 ug/min via INTRAVENOUS

## 2023-03-09 MED ORDER — EPHEDRINE SULFATE (PRESSORS) 50 MG/ML IJ SOLN
INTRAMUSCULAR | Status: DC | PRN
  Administered 2023-03-09 (×2): 5 mg via INTRAVENOUS

## 2023-03-09 MED ORDER — MIDAZOLAM HCL 5 MG/5ML IJ SOLN
INTRAMUSCULAR | Status: DC | PRN
  Administered 2023-03-09 (×2): 1 mg via INTRAVENOUS

## 2023-03-09 MED ORDER — ROCURONIUM BROMIDE 100 MG/10ML IV SOLN
INTRAVENOUS | Status: DC | PRN
  Administered 2023-03-09: 70 mg via INTRAVENOUS

## 2023-03-09 MED ORDER — LIDOCAINE HCL (PF) 2 % IJ SOLN
INTRAMUSCULAR | Status: AC
  Filled 2023-03-09: qty 5

## 2023-03-09 MED ORDER — METHOCARBAMOL 500 MG IVPB - SIMPLE MED: 500.0000 mg | Freq: Four times a day (QID) | INTRAVENOUS | Status: DC | PRN

## 2023-03-09 MED ORDER — CHLORHEXIDINE GLUCONATE 0.12 % MT SOLN
15.0000 mL | Freq: Once | OROMUCOSAL | Status: AC
  Administered 2023-03-09: 15 mL via OROMUCOSAL

## 2023-03-09 MED ORDER — WATER FOR IRRIGATION, STERILE IR SOLN
Status: DC | PRN
  Administered 2023-03-09: 2000 mL

## 2023-03-09 MED ORDER — TIZANIDINE HCL 2 MG PO TABS: 2.0000 mg | ORAL_TABLET | Freq: Four times a day (QID) | ORAL | 0 refills | Status: DC | PRN

## 2023-03-09 MED ORDER — 0.9 % SODIUM CHLORIDE (POUR BTL) OPTIME
TOPICAL | Status: DC | PRN
  Administered 2023-03-09: 1000 mL

## 2023-03-09 MED ORDER — METHOCARBAMOL 500 MG PO TABS: 500.0000 mg | ORAL_TABLET | Freq: Four times a day (QID) | ORAL | Status: DC | PRN

## 2023-03-09 MED ORDER — AMISULPRIDE (ANTIEMETIC) 5 MG/2ML IV SOLN: 10.0000 mg | Freq: Once | INTRAVENOUS | Status: DC | PRN

## 2023-03-09 MED ORDER — BUPIVACAINE HCL (PF) 0.5 % IJ SOLN
INTRAMUSCULAR | Status: DC | PRN
  Administered 2023-03-09: 15 mL via PERINEURAL

## 2023-03-09 MED ORDER — OXYCODONE HCL 5 MG PO TABS: 5.0000 mg | ORAL_TABLET | Freq: Once | ORAL | Status: DC | PRN

## 2023-03-09 MED ORDER — PROPOFOL 10 MG/ML IV BOLUS
INTRAVENOUS | Status: AC
  Filled 2023-03-09: qty 20

## 2023-03-09 MED ORDER — PROPOFOL 10 MG/ML IV BOLUS
INTRAVENOUS | Status: DC | PRN
  Administered 2023-03-09: 140 mg via INTRAVENOUS

## 2023-03-09 MED ORDER — FENTANYL CITRATE (PF) 100 MCG/2ML IJ SOLN
INTRAMUSCULAR | Status: AC
  Filled 2023-03-09: qty 2

## 2023-03-09 MED ORDER — BUPIVACAINE LIPOSOME 1.3 % IJ SUSP
INTRAMUSCULAR | Status: DC | PRN
  Administered 2023-03-09: 10 mL via PERINEURAL

## 2023-03-09 MED ORDER — LIDOCAINE HCL (CARDIAC) PF 100 MG/5ML IV SOSY
PREFILLED_SYRINGE | INTRAVENOUS | Status: DC | PRN
  Administered 2023-03-09: 60 mg via INTRAVENOUS

## 2023-03-09 MED ORDER — ONDANSETRON HCL 4 MG/2ML IJ SOLN
INTRAMUSCULAR | Status: AC
  Filled 2023-03-09: qty 6

## 2023-03-09 MED ORDER — EPHEDRINE 5 MG/ML INJ
INTRAVENOUS | Status: AC
  Filled 2023-03-09: qty 5

## 2023-03-09 MED ORDER — TRANEXAMIC ACID-NACL 1000-0.7 MG/100ML-% IV SOLN
1000.0000 mg | INTRAVENOUS | Status: AC
  Administered 2023-03-09: 1000 mg via INTRAVENOUS
  Filled 2023-03-09: qty 100

## 2023-03-09 MED ORDER — OXYCODONE HCL 5 MG PO TABS: 5.0000 mg | ORAL_TABLET | ORAL | 0 refills | Status: DC | PRN

## 2023-03-09 MED ORDER — PHENYLEPHRINE HCL-NACL 20-0.9 MG/250ML-% IV SOLN
INTRAVENOUS | Status: AC
  Filled 2023-03-09: qty 750

## 2023-03-09 MED ORDER — FENTANYL CITRATE (PF) 100 MCG/2ML IJ SOLN
INTRAMUSCULAR | Status: DC | PRN
  Administered 2023-03-09 (×2): 50 ug via INTRAVENOUS

## 2023-03-09 MED ORDER — SUGAMMADEX SODIUM 500 MG/5ML IV SOLN
INTRAVENOUS | Status: DC | PRN
  Administered 2023-03-09: 200 mg via INTRAVENOUS

## 2023-03-09 MED ORDER — PHENYLEPHRINE 80 MCG/ML (10ML) SYRINGE FOR IV PUSH (FOR BLOOD PRESSURE SUPPORT)
PREFILLED_SYRINGE | INTRAVENOUS | Status: AC
  Filled 2023-03-09: qty 10

## 2023-03-09 MED ORDER — DEXAMETHASONE SODIUM PHOSPHATE 10 MG/ML IJ SOLN
INTRAMUSCULAR | Status: AC
  Filled 2023-03-09: qty 3

## 2023-03-09 MED ORDER — DEXAMETHASONE SODIUM PHOSPHATE 10 MG/ML IJ SOLN
INTRAMUSCULAR | Status: DC | PRN
  Administered 2023-03-09: 4 mg via INTRAVENOUS

## 2023-03-09 MED ORDER — CEFAZOLIN SODIUM-DEXTROSE 2-4 GM/100ML-% IV SOLN
2.0000 g | INTRAVENOUS | Status: AC
  Administered 2023-03-09: 2 g via INTRAVENOUS
  Filled 2023-03-09: qty 100

## 2023-03-09 MED ORDER — ONDANSETRON HCL 4 MG/2ML IJ SOLN
4.0000 mg | Freq: Once | INTRAMUSCULAR | Status: AC | PRN
  Administered 2023-03-09: 4 mg via INTRAVENOUS

## 2023-03-09 MED ORDER — LACTATED RINGERS IV SOLN: INTRAVENOUS | Status: DC

## 2023-03-09 MED ORDER — MIDAZOLAM HCL 2 MG/2ML IJ SOLN
INTRAMUSCULAR | Status: AC
  Filled 2023-03-09: qty 2

## 2023-03-09 MED ORDER — HYDROMORPHONE HCL 1 MG/ML IJ SOLN: 0.2500 mg | INTRAMUSCULAR | Status: DC | PRN

## 2023-03-09 MED ORDER — ORAL CARE MOUTH RINSE: 15.0000 mL | Freq: Once | OROMUCOSAL | Status: AC

## 2023-03-09 MED ORDER — ALBUTEROL SULFATE (2.5 MG/3ML) 0.083% IN NEBU
INHALATION_SOLUTION | RESPIRATORY_TRACT | Status: AC
  Filled 2023-03-09: qty 3

## 2023-03-09 SURGICAL SUPPLY — 75 items
AID PSTN UNV HD RSTRNT DISP (MISCELLANEOUS) ×1
BAG SPEC THK2 15X12 ZIP CLS (MISCELLANEOUS) ×1
BAG ZIPLOCK 12X15 (MISCELLANEOUS) ×1 IMPLANT
BASEPLATE P2 COATD GLND 6.5X30 (Shoulder) IMPLANT
BIT DRILL 1.6MX128 (BIT) IMPLANT
BIT DRILL 2.5 DIA 127 CALI (BIT) IMPLANT
BIT DRILL 4 DIA CALIBRATED (BIT) IMPLANT
BLADE SAW SGTL 73X25 THK (BLADE) ×1 IMPLANT
BOOTIES KNEE HIGH SLOAN (MISCELLANEOUS) ×2 IMPLANT
BSPLAT GLND 30 STRL LF SHLDR (Shoulder) ×1 IMPLANT
COOLER ICEMAN CLASSIC (MISCELLANEOUS) IMPLANT
COVER BACK TABLE 60X90IN (DRAPES) ×1 IMPLANT
COVER SURGICAL LIGHT HANDLE (MISCELLANEOUS) ×1 IMPLANT
DRAPE INCISE IOBAN 66X45 STRL (DRAPES) ×1 IMPLANT
DRAPE ORTHO SPLIT 77X108 STRL (DRAPES) ×2
DRAPE POUCH INSTRU U-SHP 10X18 (DRAPES) ×1 IMPLANT
DRAPE SHEET LG 3/4 BI-LAMINATE (DRAPES) ×1 IMPLANT
DRAPE SURG 17X11 SM STRL (DRAPES) ×1 IMPLANT
DRAPE SURG ORHT 6 SPLT 77X108 (DRAPES) ×2 IMPLANT
DRAPE TOP 10253 STERILE (DRAPES) ×1 IMPLANT
DRAPE U-SHAPE 47X51 STRL (DRAPES) ×1 IMPLANT
DRSG AQUACEL AG ADV 3.5X 6 (GAUZE/BANDAGES/DRESSINGS) ×1 IMPLANT
DURAPREP 26ML APPLICATOR (WOUND CARE) ×2 IMPLANT
ELECT BLADE TIP CTD 4 INCH (ELECTRODE) ×1 IMPLANT
ELECT REM PT RETURN 15FT ADLT (MISCELLANEOUS) ×1 IMPLANT
FACESHIELD WRAPAROUND (MASK) ×1 IMPLANT
FACESHIELD WRAPAROUND OR TEAM (MASK) ×1 IMPLANT
GLOVE BIO SURGEON STRL SZ7.5 (GLOVE) ×1 IMPLANT
GLOVE BIOGEL PI IND STRL 6.5 (GLOVE) ×1 IMPLANT
GLOVE BIOGEL PI IND STRL 8 (GLOVE) ×1 IMPLANT
GLOVE SURG SS PI 6.5 STRL IVOR (GLOVE) ×1 IMPLANT
GOWN STRL REUS W/ TWL LRG LVL3 (GOWN DISPOSABLE) ×1 IMPLANT
GOWN STRL REUS W/ TWL XL LVL3 (GOWN DISPOSABLE) ×1 IMPLANT
GOWN STRL REUS W/TWL LRG LVL3 (GOWN DISPOSABLE) ×1
GOWN STRL REUS W/TWL XL LVL3 (GOWN DISPOSABLE) ×1
HANDPIECE INTERPULSE COAX TIP (DISPOSABLE) ×1
HOOD PEEL AWAY T7 (MISCELLANEOUS) ×3 IMPLANT
INSERT HUMERAL SOCKET HXE+ 32 (Insert) IMPLANT
KIT BASIN OR (CUSTOM PROCEDURE TRAY) ×1 IMPLANT
KIT TURNOVER KIT A (KITS) IMPLANT
MANIFOLD NEPTUNE II (INSTRUMENTS) ×1 IMPLANT
NDL TROCAR POINT SZ 2 1/2 (NEEDLE) IMPLANT
NEEDLE TROCAR POINT SZ 2 1/2 (NEEDLE) IMPLANT
NS IRRIG 1000ML POUR BTL (IV SOLUTION) ×1 IMPLANT
P2 COATDE GLNOID BSEPLT 6.5X30 (Shoulder) ×1 IMPLANT
PACK SHOULDER (CUSTOM PROCEDURE TRAY) ×1 IMPLANT
PAD COLD SHLDR WRAP-ON (PAD) IMPLANT
RESTRAINT HEAD UNIVERSAL NS (MISCELLANEOUS) ×1 IMPLANT
RETRIEVER SUT HEWSON (MISCELLANEOUS) IMPLANT
SCREW BONE LOCKING RSP 5.0X14 (Screw) ×2 IMPLANT
SCREW BONE LOCKING RSP 5.0X30 (Screw) ×1 IMPLANT
SCREW BONE RSP LOCK 5X14 (Screw) IMPLANT
SCREW BONE RSP LOCK 5X26 (Screw) IMPLANT
SCREW BONE RSP LOCK 5X30 (Screw) IMPLANT
SCREW BONE RSP LOCKING 5.0X26 (Screw) ×1 IMPLANT
SCREW RETAIN W/HEAD 4MM OFFSET (Shoulder) IMPLANT
SET HNDPC FAN SPRY TIP SCT (DISPOSABLE) ×1 IMPLANT
SLING ARM IMMOBILIZER LRG (SOFTGOODS) IMPLANT
STEM HUMERAL SM SHELL 12X48 (Stem) IMPLANT
STEM HUMERAL SMALL SHELL 12X48 (Stem) ×1 IMPLANT
STRIP CLOSURE SKIN 1/2X4 (GAUZE/BANDAGES/DRESSINGS) ×2 IMPLANT
SUCTION FRAZIER HANDLE 10FR (MISCELLANEOUS)
SUCTION TUBE FRAZIER 10FR DISP (MISCELLANEOUS) IMPLANT
SUPPORT WRAP ARM LG (MISCELLANEOUS) ×1 IMPLANT
SUT ETHIBOND 2 V 37 (SUTURE) IMPLANT
SUT FIBERWIRE #2 38 REV NDL BL (SUTURE) ×1
SUT MNCRL AB 4-0 PS2 18 (SUTURE) ×1 IMPLANT
SUT VIC AB 2-0 CT1 27 (SUTURE) ×2
SUT VIC AB 2-0 CT1 TAPERPNT 27 (SUTURE) ×2 IMPLANT
SUTURE FIBERWR#2 38 REV NDL BL (SUTURE) IMPLANT
TAPE LABRALWHITE 1.5X36 (TAPE) IMPLANT
TAPE SUT LABRALTAP WHT/BLK (SUTURE) IMPLANT
TOWEL OR 17X26 10 PK STRL BLUE (TOWEL DISPOSABLE) ×1 IMPLANT
TOWEL OR NON WOVEN STRL DISP B (DISPOSABLE) ×1 IMPLANT
WATER STERILE IRR 1000ML POUR (IV SOLUTION) ×1 IMPLANT

## 2023-03-09 NOTE — H&P (Signed)
Katherine Nixon is an 77 y.o. female.   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  Pain interferes with sleep and quality of life.   Past Medical History:  Diagnosis Date   Anxiety    Basal cell carcinoma 01/2018   "upper lip"   Chronic diarrhea    Complication of anesthesia 1989   diff to wake up with gallbaldder surgery- no problems since (03/12/2018)   Connective tissue disorder    "lupus"   Depression    Dysrhythmia     states at times, last occurence was january 2019 feels like heart fluttering , last a couple seconds, denies any associated cardiac symtpoms. rpeorts at her last annual check up her pcp Methe[facility]  heard it on pysical  exam and ordred an ECHO , per patietn no one reached out to her to set it up    GERD (gastroesophageal reflux disease)    Heart murmur    High cholesterol    History of blood transfusion 1970   "related to childbirth"   Hypertension    IBS (irritable bowel syndrome)    Migraine    "none since ~ 1980" (03/12/2018)   Neuromuscular disorder    lupus   Osteoarthritis    "all my joints" (03/12/2018)   Squamous cell carcinoma    "right arm"    Past Surgical History:  Procedure Laterality Date   BASAL CELL CARCINOMA EXCISION  01/2018   "upper lip"   BREAST BIOPSY Left     neg   BREAST CYST ASPIRATION Right    CATARACT EXTRACTION W/ INTRAOCULAR LENS  IMPLANT, BILATERAL     CHOLECYSTECTOMY OPEN  1989   COLONOSCOPY     DILATION AND CURETTAGE OF UTERUS     JOINT REPLACEMENT     KNEE ARTHROSCOPY Bilateral    NASAL SINUS SURGERY     "severe infection"   SHOULDER ARTHROSCOPY Left 12/2014   Dr. Luiz Iron CELL CARCINOMA EXCISION Right    arm   TOTAL HIP ARTHROPLASTY Right 08/31/2016   Procedure: TOTAL HIP ARTHROPLASTY ANTERIOR APPROACH;  Surgeon: Gean Birchwood, MD;  Location: [facility] OR;  Service:  Orthopedics;  Laterality: Right;   TOTAL KNEE ARTHROPLASTY Right 03/12/2018   Procedure: TOTAL KNEE ARTHROPLASTY;  Surgeon: Gean Birchwood, MD;  Location: [facility] OR;  Service: Orthopedics;  Laterality: Right;   TOTAL KNEE ARTHROPLASTY     TOTAL KNEE ARTHROPLASTY Left 01/15/2019   Procedure: TOTAL KNEE ARTHROPLASTY;  Surgeon: Marcene Corning, MD;  Location: [facility] ORS;  Service: Orthopedics;  Laterality: Left;   VARICOSE VEIN SURGERY Bilateral     Family History  Problem Relation Age of Onset   Heart disease Mother    Heart attack Mother    Hyperlipidemia Father    Other Father        amputaiton   Hyperlipidemia Brother    Hypertension Brother    Social History:  reports that she quit smoking about 35 years ago. Her smoking use included cigarettes. She has a 5.00 pack-year smoking history. She has never used smokeless tobacco. She reports that she does not drink alcohol and does not use drugs.  Allergies:  Allergies  Allergen Reactions   Gabapentin Other (See Comments)    Hallucinations    Tramadol Other (See Comments)    Hallucinations    Tylenol [Acetaminophen] Other (See Comments)    Due to liver enzyme  issue   Pravastatin Other (See Comments)    Muscle cramping   Sulfamethoxazole-Trimethoprim Other (See Comments)    Thrush in mouth    Medications Prior to Admission  Medication Sig Dispense Refill   amLODipine (NORVASC) 5 MG tablet Take 1 tablet (5 mg total) by mouth daily. 90 tablet 3   atorvastatin (LIPITOR) 20 MG tablet Take 1 tablet (20 mg total) by mouth daily. NO REFILLS. NEEDS AN APPT BEFORE NEXT RF REQUEST 90 tablet 0   budesonide (ENTOCORT EC) 3 MG 24 hr capsule Take 3 mg by mouth daily.     Cholecalciferol (VITAMIN D3 PO) Take 1 tablet by mouth daily.     DULoxetine (CYMBALTA) 60 MG capsule TAKE 1 CAPSULE DAILY 90 capsule 3   hydrochlorothiazide (MICROZIDE) 12.5 MG capsule Take 1 capsule (12.5 mg total) by mouth daily. NO REFILLS. NEEDS AN APPT BEFORE NEXT RF REQUEST 90  capsule 0   ibuprofen (ADVIL) 600 MG tablet Take 600 mg by mouth at bedtime.     linaclotide (LINZESS) 72 MCG capsule Take 72 mcg by mouth daily as needed (constipation).     MAGNESIUM PO Take 1 tablet by mouth daily.     Multiple Vitamin (MULTIVITAMIN WITH MINERALS) TABS tablet Take 1 tablet by mouth daily.      pregabalin (LYRICA) 75 MG capsule Take 1 capsule (75 mg total) by mouth at bedtime. 90 capsule 3   traZODone (DESYREL) 100 MG tablet TAKE 1 TABLET AT BEDTIME 90 tablet 3   ibuprofen (ADVIL) 800 MG tablet Take 1 tablet (800 mg total) by mouth at bedtime. (Patient not taking: Reported on 02/21/2023) 90 tablet 2   potassium chloride SA (KLOR-CON M) 20 MEQ tablet TAKE 1 TABLET DAILY (Patient not taking: Reported on 02/21/2023) 90 tablet 3   POTASSIUM PO Take 1 tablet by mouth daily.      No results found for this or any previous visit (from the past 48 hour(s)). No results found.  Review of Systems  All other systems reviewed and are negative.   Blood pressure (!) 168/66, pulse 72, temperature (!) 97.4 F (36.3 C), temperature source Oral, resp. rate 16, height  (1.651 m), weight 94.8 kg, SpO2 97 %. Physical Exam HENT:     Head: Atraumatic.  Eyes:     Extraocular Movements: Extraocular movements intact.  Cardiovascular:     Pulses: Normal pulses.  Pulmonary:     Effort: Pulmonary effort is normal.  Musculoskeletal:     Comments: R shoulder pain with limited ROM. NVID  Neurological:     Mental Status: She is alert.      Assessment/Plan R shoulder rotator cuff tear arthropathy Plan R reverse TSA Risks / benefits of surgery discussed Consent on chart  NPO for OR Preop antibiotics   Glennon Hamilton, MD 03/09/2023, 6:21 AM

## 2023-03-09 NOTE — Op Note (Signed)
Procedure(s): REVERSE SHOULDER ARTHROPLASTY Procedure Note  Danaya Geddis female 77 y.o. 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 female  With endstage right shoulder arthritis with irrepairable rotator cuff tear. Pain and dysfunction interfered with quality of life and nonoperative treatment with activity modification, NSAIDS and injections failed.     Surgeon: Glennon Hamilton   Assistants: Fredia Sorrow PA-C Amber was present and scrubbed throughout the procedure and was essential in positioning, retraction, exposure, and closure)  Anesthesia: General endotracheal anesthesia with preoperative interscalene block given by the attending anesthesiologist    Procedure Detail  REVERSE SHOULDER ARTHROPLASTY   Estimated Blood Loss:  200 mL         Drains: none  Blood Given: none          Specimens: none        Complications:  * No complications entered in OR log *         Disposition: PACU - hemodynamically stable.         Condition: stable      OPERATIVE FINDINGS:  A DJO Altivate pressfit reverse total shoulder arthroplasty was placed with a  size 12 stem, a 32-4 glenosphere, and a standard retentive poly insert. The base plate  fixation was excellent.  PROCEDURE: The patient was identified in the preoperative holding area  where I personally marked the operative site after verifying site, side,  and procedure with the patient. An interscalene block given by  the attending anesthesiologist in the holding area and the patient was taken back to the operating room where all extremities were  carefully padded in position after general anesthesia was induced. She  was placed in a beach-chair position and the operative upper extremity was  prepped and draped in a  standard sterile fashion. An approximately 10-  cm incision was made from the tip of the coracoid process to the center  point of the humerus at the level of the axilla. Dissection was carried  down through subcutaneous tissues to the level of the cephalic vein  which was taken laterally with the deltoid. The pectoralis major was  retracted medially. The subdeltoid space was developed and the lateral  edge of the conjoined tendon was identified. The undersurface of  conjoined tendon was palpated and the musculocutaneous nerve was not in  the field. Retractor was placed underneath the conjoined and second  retractor was placed lateral into the deltoid. The circumflex humeral  artery and vessels were identified and clamped and coagulated. The  biceps tendon was absent.  The subscapularis was taken down as a peel with the underlying capsule.  The  joint was then gently externally rotated while the capsule was released  from the humeral neck around to just beyond the 6 o'clock position. At  this point, the joint was dislocated and the humeral head was presented  into the wound. The excessive osteophyte formation was removed with a  large rongeur.  The cutting guide was used to make the appropriate  head cut and the head was saved for potentially bone grafting.  The glenoid was exposed with the arm in an  abducted extended position. The anterior and posterior labrum were  completely excised and the capsule was released circumferentially to  allow for exposure of the glenoid for preparation. The 2.5 mm drill was  placed using the guide in 5-10 inferior angulation and the tap was then advanced in the same hole. Small and large reamers were then used. The tap was then removed and the Metaglene was then screwed in with excellent purchase.  The peripheral guide was then used to drilled measured and filled peripheral locking screws. The size 32-4 glenosphere was then impacted on the [facility] taper and the  central screw was placed. The humerus was then again exposed and the diaphyseal reamers were used followed by the metaphyseal reamers. The final broach was left in place in the proximal trial was placed. The joint was reduced and with this implant it was felt that soft tissue tensioning was appropriate with excellent stability and excellent range of motion. Therefore, final humeral stem was placed press-fit.  And then the trial polyethylene inserts were tested again and the above implant was felt to be the most appropriate for final insertion. The joint was reduced taken through full range of motion and felt to be stable. Soft tissue tension was appropriate.  The joint was then copiously irrigated with pulse  lavage and the wound was then closed. The subscapularis was repaired with one #2 FiberWire through bone tunnel.  Skin was closed with 2-0 Vicryl in a deep dermal layer and 4-0  Monocryl for skin closure. Steri-Strips were applied. Sterile  dressings were then applied as well as a sling. The patient was allowed  to awaken from general anesthesia, transferred to stretcher, and taken  to recovery room in stable condition.   POSTOPERATIVE PLAN: The patient will be observed in the recovery room and if her pain is well-controlled with the regional anesthesia and she is hemodynamically stable she can be discharged home today with family.

## 2023-03-09 NOTE — Anesthesia Procedure Notes (Signed)
Procedure Name: Intubation Date/Time: 03/09/2023 7:39 AM  Performed by: Mal Amabile, MDPre-anesthesia Checklist: Patient identified, Emergency Drugs available, Suction available and Patient being monitored Patient Re-evaluated:Patient Re-evaluated prior to induction Oxygen Delivery Method: Circle system utilized Preoxygenation: Pre-oxygenation with 100% oxygen Induction Type: IV induction Ventilation: Mask ventilation without difficulty Laryngoscope Size: Mac and 3 Grade View: Grade II Tube type: Oral Tube size: 7.0 mm Number of attempts: 1 Airway Equipment and Method: Stylet and Oral airway Placement Confirmation: ETT inserted through vocal cords under direct vision, positive ETCO2 and breath sounds checked- equal and bilateral Secured at: 22 cm Tube secured with: Tape Dental Injury: Teeth and Oropharynx as per pre-operative assessment

## 2023-03-09 NOTE — Addendum Note (Signed)
Addendum  created 03/09/23 1002 by Shanon Payor, CRNA   Intraprocedure Flowsheets edited

## 2023-03-09 NOTE — Anesthesia Procedure Notes (Signed)
Anesthesia Regional Block: Interscalene brachial plexus block   Pre-Anesthetic Checklist: , timeout performed,  Correct Patient, Correct Site, Correct Laterality,  Correct Procedure, Correct Position, site marked,  Risks and benefits discussed,  Surgical consent,  Pre-op evaluation,  At surgeon's request and post-op pain management  Laterality: Right  Prep: chloraprep       Needles:  Injection technique: Single-shot  Needle Type: Echogenic Stimulator Needle     Needle Length: 10cm  Needle Gauge: 21     Additional Needles:   Procedures:,,,, ultrasound used (permanent image in chart),,   Motor weakness within 5 minutes.  Narrative:  Start time: 03/09/2023 7:05 AM End time: 03/09/2023 7:10 AM Injection made incrementally with aspirations every 5 mL.  Performed by: Personally  Anesthesiologist: Mal Amabile, MD  Additional Notes: Timeout performed. Patient sedated. Relevant anatomy ID'd using [place]. Incremental 2-43ml injection of LA with frequent aspiration. Patient tolerated procedure well.

## 2023-03-09 NOTE — Anesthesia Postprocedure Evaluation (Signed)
Anesthesia Post Note  Patient: Katherine Nixon  Procedure(s) Performed: REVERSE SHOULDER ARTHROPLASTY (Right: Shoulder)     Patient location during evaluation: PACU Anesthesia Type: General Level of consciousness: awake and alert and oriented Pain management: pain level controlled Vital Signs Assessment: post-procedure vital signs reviewed and stable Respiratory status: spontaneous breathing, nonlabored ventilation and respiratory function stable Cardiovascular status: blood pressure returned to baseline and stable Postop Assessment: no apparent nausea or vomiting Anesthetic complications: no   No notable events documented.  Last Vitals:  Vitals:   03/09/23 0930 03/09/23 0945  BP: (!) 142/67 120/76  Pulse: 67 73  Resp: 16 13  Temp:    SpO2: 99% 90%    Last Pain:  Vitals:   03/09/23 0945  TempSrc:   PainSc: 0-No pain                 Lynnex Fulp A.

## 2023-03-09 NOTE — Discharge Instructions (Signed)
Discharge Instructions after Reverse Total Shoulder Arthroplasty   A sling has been provided for you. You are to wear this at all times (except for bathing and dressing), until your first post operative visit with Dr. Ave Filter. Please also wear while sleeping at night. While you bath and dress, let the arm/elbow extend straight down to stretch your elbow. Wiggle your fingers and pump your first while your in the sling to prevent hand swelling. You are not cleared to drive for at least 2 weeks.  Use ice on the shoulder intermittently over the first 48 hours after surgery. Continue to use ice or and ice machine as needed after 48 hours for pain control/swelling.  Pain medicine has been prescribed for you.  Use your medicine liberally over the first 48 hours, and then you can begin to taper your use. You may take Extra Strength Tylenol or Tylenol only in place of the pain pills. DO NOT take ANY nonsteroidal anti-inflammatory pain medications: Advil, Motrin, Ibuprofen, Aleve, Naproxen or Naprosyn.  Take one aspirin  a day for 2 weeks after surgery, unless you have an aspirin sensitivity/allergy or asthma.  Leave your dressing on until your first follow up visit.  You may shower with the dressing.  Hold your arm as if you still have your sling on while you shower. Simply allow the water to wash over the site and then pat dry. Make sure your axilla (armpit) is completely dry after showering.    Please call [phone] during normal business hours or [phone] after hours for any problems. Including the following:  - excessive redness of the incisions - drainage for more than 4 days - fever of more than 101.5 F  *Please note that pain medications will not be refilled after hours or on weekends.    Dental Antibiotics:  In most cases prophylactic antibiotics for Dental procdeures after total joint surgery are not necessary.  Exceptions are as follows:  1. History of prior total joint  infection  2. Severely immunocompromised (Organ Transplant, cancer chemotherapy, Rheumatoid biologic meds such as Humera)  3. Poorly controlled diabetes (A1C &gt; 8.0, blood glucose over 200)  If you have one of these conditions, contact your surgeon for an antibiotic prescription, prior to your dental procedure.

## 2023-03-09 NOTE — Transfer of Care (Signed)
Immediate Anesthesia Transfer of Care Note  Patient: Katherine Nixon  Procedure(s) Performed: REVERSE SHOULDER ARTHROPLASTY (Right: Shoulder)  Patient Location: PACU  Anesthesia Type:General  Level of Consciousness: awake, alert , drowsy, and patient cooperative  Airway & Oxygen Therapy: Patient Spontanous Breathing and Patient connected to face mask oxygen  Post-op Assessment: Report given to RN and Post -op Vital signs reviewed and stable  Post vital signs: Reviewed and stable  Last Vitals:  Vitals Value Taken Time  BP 181/82 03/09/23 0900  Temp    Pulse 35 03/09/23 0901  Resp 22 03/09/23 0901  SpO2 96 % 03/09/23 0901  Vitals shown include unvalidated device data.  Last Pain:  Vitals:   03/09/23 0558  TempSrc: Oral  PainSc:       Patients Stated Pain Goal: 4 (03/09/23 0543)  Complications: No notable events documented.

## 2023-03-09 NOTE — Evaluation (Signed)
Occupational Therapy Evaluation Patient Details Name: Katherine Nixon MRN: [id number] DOB: 20-Feb-1946 Today's Date: 03/09/2023   History of Present Illness Pt s/p R reverse total shoulder arthroplasty. PMH significant for anxiety, connective tissue disorder, depression, dysrhythmia, GERD, HTN, IBS, HTN, osteoarthritis.   Clinical Impression   PTA, pt lived with her husband who intermittently assisted with UB ADL. Upon eval, pt performing UB ADL with up to max A and LB ADL with up to mod A. Pt and husband educated and demonstrating use of compensatory techniques for UB dressing, sling application, positioning, transfers, LB ADL, and grooming within precautions. Reviewed all recommended exercises per physician orders as well as ice machine. Pt with mild balance limitations (overall supervision-min guard A) and reporting willing to use cane for now to decr fall risk. Pt reports her daughter who is an Charity fundraiser will be staying with her for several days. Recommending follow physician orders for discharge.      Recommendations for follow up therapy are one component of a multi-disciplinary discharge planning process, led by the attending physician.  Recommendations may be updated based on patient status, additional functional criteria and insurance authorization.   Assistance Recommended at Discharge Intermittent Supervision/Assistance  Patient can return home with the following A little help with walking and/or transfers;A lot of help with bathing/dressing/bathroom;Assistance with cooking/housework;Assist for transportation;Help with stairs or ramp for entrance    Functional Status Assessment  Patient has had a recent decline in their functional status and demonstrates the ability to make significant improvements in function in a reasonable and predictable amount of time.  Equipment Recommendations  None recommended by OT    Recommendations for Other Services       Precautions / Restrictions  Precautions Precautions: Shoulder Type of Shoulder Precautions: No AROM/PROM at shoulder. Ok for AROM elbow/wrist/hand to tolerance Shoulder Interventions: Shoulder sling/immobilizer;Off for dressing/bathing/exercises Precaution Booklet Issued: Yes (comment) Precaution Comments: All precautions reviewed within the context of ADL Required Braces or Orthoses: Sling Restrictions Weight Bearing Restrictions: Yes RUE Weight Bearing: Non weight bearing      Mobility Bed Mobility               General bed mobility comments: In recliner on arrival and deparuture. Reviewed bed mobility and sleep positioning    Transfers Overall transfer level: Needs assistance Equipment used: None Transfers: Sit to/from Stand, Bed to chair/wheelchair/BSC Sit to Stand: Supervision     Step pivot transfers: Supervision, Min guard     General transfer comment: up to min guard A for ambulatory transfers      Balance Overall balance assessment: Mild deficits observed, not formally tested                                         ADL either performed or assessed with clinical judgement   ADL Overall ADL's : Needs assistance/impaired Eating/Feeding: Set up;Sitting   Grooming: Minimal assistance;Sitting   Upper Body Bathing: Moderate assistance;Sitting;Standing   Lower Body Bathing: Minimal assistance;Sit to/from stand   Upper Body Dressing : Maximal assistance;Sitting;Cueing for UE precautions;With caregiver independent assisting Upper Body Dressing Details (indicate cue type and reason): Pt cueing caregiver on how to assist Lower Body Dressing: Moderate assistance Lower Body Dressing Details (indicate cue type and reason): assist for donning shoes and pulling up pants Toilet Transfer: Min guard;Ambulation;Regular Toilet       Tub/ Engineer, structural: Min guard;Walk-in shower;Ambulation  Functional mobility during ADLs: Min guard General ADL Comments: Pt did not have her  cane with her for surgery today, but reports that she is willing to use her cane upon discharge due to mild balance deficits (up to min guard A for functional mobility)     Vision Baseline Vision/History: 0 No visual deficits Ability to See in Adequate Light: 0 Adequate Patient Visual Report: No change from baseline Vision Assessment?: No apparent visual deficits     Perception Perception Perception Tested?: No   Praxis Praxis Praxis tested?: Within functional limits    Pertinent Vitals/Pain Pain Assessment Pain Assessment: No/denies pain     Hand Dominance Right   Extremity/Trunk Assessment Upper Extremity Assessment Upper Extremity Assessment: RUE deficits/detail RUE Deficits / Details: Able to wiggle fingers on R hand by end of session, but RUE still predominantly flaccid with nerve block RUE:  (unable to fully assess due to nerve block) RUE Sensation: decreased light touch RUE Coordination: decreased gross motor;decreased fine motor   Lower Extremity Assessment Lower Extremity Assessment: Generalized weakness   Cervical / Trunk Assessment Cervical / Trunk Assessment: Normal   Communication Communication Communication: No difficulties   Cognition Arousal/Alertness: Awake/alert, Lethargic, Suspect due to medications (initially lethargic) Behavior During Therapy: WFL for tasks assessed/performed Overall Cognitive Status: Within Functional Limits for tasks assessed                                       General Comments  VSS    Exercises Exercises: Shoulder Shoulder Exercises Elbow Flexion: PROM, Right, 10 reps, Seated Elbow Extension: PROM, Right, 10 reps, Seated Wrist Flexion: PROM, Right, 10 reps, Seated Wrist Extension: PROM, Right, 10 reps, Seated Digit Composite Flexion: PROM, Right, 10 reps, Seated Composite Extension: PROM, Right, 10 reps, Seated Neck Flexion: AROM, 5 reps, Seated Neck Extension: AROM, 5 reps, Seated Neck Lateral  Flexion - Right: AROM, Seated, 5 reps Neck Lateral Flexion - Left: AROM, 5 reps, Seated   Shoulder Instructions Shoulder Instructions Donning/doffing shirt without moving shoulder: Moderate assistance;Caregiver independent with task;Patient able to independently direct caregiver Method for sponge bathing under operated UE: Supervision/safety Donning/doffing sling/immobilizer: Maximal assistance;Patient able to independently direct caregiver;Caregiver independent with task Correct positioning of sling/immobilizer: Caregiver independent with task ROM for elbow, wrist and digits of operated UE: Supervision/safety;Caregiver independent with task;Patient able to independently direct caregiver Sling wearing schedule (on at all times/off for ADL's): Modified independent Proper positioning of operated UE when showering: Supervision/safety Positioning of UE while sleeping: Modified independent    Home Living Family/patient expects to be discharged to:: Private residence Living Arrangements: Spouse/significant other Available Help at Discharge: Family;Available 24 hours/day Type of Home: House Home Access: Stairs to enter [employer] of Steps: 3 Entrance Stairs-Rails: Right;Left Home Layout: Two level;Able to live on main level with bedroom/bathroom Alternate Level Stairs-Number of Steps: flight   Bathroom Shower/Tub: Walk-in shower         Home Equipment: Agricultural consultant (2 wheels);Cane - quad;Shower seat;BSC/3in1   Additional Comments: daughter who is RN to stay for several days      Prior Functioning/Environment Prior Level of Function : Independent/Modified Independent             Mobility Comments: intermittent use of cane; pt reports several falls when not using cane. Good insight that she will need her cane ADLs Comments: indep, husband helps to clasp bra at baseline  OT Problem List: Decreased strength;Decreased activity tolerance;Decreased range of  motion;Impaired balance (sitting and/or standing);Impaired UE functional use      OT Treatment/Interventions:      OT Goals(Current goals can be found in the care plan section) Acute Rehab OT Goals Patient Stated Goal: get better OT Goal Formulation: With patient/family  OT Frequency:      Co-evaluation              AM-PAC OT "6 Clicks" Daily Activity     Outcome Measure Help from another person eating meals?: A Little Help from another person taking care of personal grooming?: A Little Help from another person toileting, which includes using toliet, bedpan, or urinal?: A Little Help from another person bathing (including washing, rinsing, drying)?: A Little Help from another person to put on and taking off regular upper body clothing?: A Lot Help from another person to put on and taking off regular lower body clothing?: A Lot 6 Click Score: 16   End of Session Equipment Utilized During Treatment: Other (comment) (sling) Nurse Communication: Mobility status  Activity Tolerance: Patient tolerated treatment well Patient left: in chair;with family/visitor present (RN across from pt room)  OT Visit Diagnosis: Unsteadiness on feet (R26.81);Muscle weakness (generalized) (M62.81);History of falling (Z91.81)                Time: 1610-9604 OT Time Calculation (min): 46 min Charges:  OT General Charges $OT Visit: 1 Visit OT Evaluation $OT Eval Low Complexity: 1 Low OT Treatments $Self Care/Home Management : 23-37 mins  Tyler Deis, OTR/L [facility] Acute Rehabilitation Office: [phone]  Katherine Nixon 03/09/2023, 1:01 PM

## 2023-03-10 ENCOUNTER — Encounter (HOSPITAL_COMMUNITY): Payer: Self-pay | Admitting: Orthopedic Surgery

## 2023-03-13 ENCOUNTER — Other Ambulatory Visit: Payer: Self-pay | Admitting: Family Medicine

## 2023-03-20 DIAGNOSIS — R197 Diarrhea, unspecified: Secondary | ICD-10-CM | POA: Diagnosis not present

## 2023-03-20 DIAGNOSIS — K52831 Collagenous colitis: Secondary | ICD-10-CM | POA: Diagnosis not present

## 2023-03-20 DIAGNOSIS — K5900 Constipation, unspecified: Secondary | ICD-10-CM | POA: Diagnosis not present

## 2023-03-24 DIAGNOSIS — M19011 Primary osteoarthritis, right shoulder: Secondary | ICD-10-CM | POA: Diagnosis not present

## 2023-04-12 ENCOUNTER — Other Ambulatory Visit: Payer: Self-pay | Admitting: Sports Medicine

## 2023-04-12 DIAGNOSIS — M5136 Other intervertebral disc degeneration, lumbar region: Secondary | ICD-10-CM

## 2023-04-26 DIAGNOSIS — Z96611 Presence of right artificial shoulder joint: Secondary | ICD-10-CM | POA: Diagnosis not present

## 2023-04-26 DIAGNOSIS — M25611 Stiffness of right shoulder, not elsewhere classified: Secondary | ICD-10-CM | POA: Diagnosis not present

## 2023-04-26 DIAGNOSIS — R531 Weakness: Secondary | ICD-10-CM | POA: Diagnosis not present

## 2023-05-01 DIAGNOSIS — Z96611 Presence of right artificial shoulder joint: Secondary | ICD-10-CM | POA: Diagnosis not present

## 2023-05-01 DIAGNOSIS — M25611 Stiffness of right shoulder, not elsewhere classified: Secondary | ICD-10-CM | POA: Diagnosis not present

## 2023-05-01 DIAGNOSIS — R531 Weakness: Secondary | ICD-10-CM | POA: Diagnosis not present

## 2023-05-02 ENCOUNTER — Ambulatory Visit (INDEPENDENT_AMBULATORY_CARE_PROVIDER_SITE_OTHER): Payer: Medicare Other | Admitting: Sports Medicine

## 2023-05-02 DIAGNOSIS — Z0000 Encounter for general adult medical examination without abnormal findings: Secondary | ICD-10-CM

## 2023-05-02 NOTE — Progress Notes (Signed)
MEDICARE ANNUAL WELLNESS VISIT  05/02/2023  Telephone Visit Disclaimer This Medicare AWV was conducted by telephone due to national recommendations for restrictions regarding the COVID-19 Pandemic (e.g. social distancing).  I verified, using two identifiers, that I am speaking with Katherine Nixon or their authorized healthcare agent. I discussed the limitations, risks, security, and privacy concerns of performing an evaluation and management service by telephone and the potential availability of an in-person appointment in the future. The patient expressed understanding and agreed to proceed.  Location of Patient: Home Location of Provider (nurse):  In the office.  Subjective:    Katherine Nixon is a 77 y.o. female patient of Metheney, Barbarann Ehlers, MD who had a Medicare Annual Wellness Visit today via telephone. Katherine Nixon is Retired and lives with their spouse. she has 4 children. she reports that she is socially active and does interact with friends/family regularly. she is moderately physically active and enjoys crafting.  Patient Care Team: Agapito Games, MD as PCP - General (Family Medicine) Dawayne Cirri, MD as Physician Assistant (Rheumatology) Konrad Felix, FNP as Nurse Practitioner (Rheumatology)     05/02/2023   10:48 AM 02/23/2023    1:18 PM 04/27/2022   11:03 AM 02/10/2021    9:03 AM 02/24/2020   11:15 AM 01/08/2020   10:05 AM 01/15/2019    4:31 PM  Advanced Directives  Does Patient Have a Medical Advance Directive? Yes Yes Yes Yes Yes Yes Yes  Type of Advance Directive Living will Healthcare Power of [place];Living will Living will Healthcare Power of [place];Living will Healthcare Power of [place];Living will Healthcare Power of [place];Living will Living will  Does patient want to make changes to medical advance directive? No - Patient declined  No - Patient declined No - Patient declined  No - Patient declined No - Patient declined  Copy of Healthcare  Power of Attorney in Chart?  No - copy requested  No - copy requested  Yes - validated most recent copy scanned in chart (See row information)     Hospital Utilization Over the Past 12 Months: # of hospitalizations or ER visits: 1 # of surgeries: 1  Review of Systems    Patient reports that her overall health is worse compared to last year.  History obtained from chart review and the patient  Patient Reported Readings (BP, Pulse, CBG, Weight, etc) none  Pain Assessment Pain : 0-10 Pain Score: 3  Pain Type: Other (Comment) (Post surgical) Pain Location: Shoulder Pain Orientation: Right Pain Descriptors / Indicators: Aching Pain Onset: More than a month ago Pain Frequency: Intermittent     Current Medications & Allergies (verified) Allergies as of 05/02/2023       Reactions   Gabapentin Other (See Comments)   Hallucinations   Tramadol Other (See Comments)   Hallucinations   Tylenol [acetaminophen] Other (See Comments)   Due to liver enzyme issue   Pravastatin Other (See Comments)   Muscle cramping   Sulfamethoxazole-trimethoprim Other (See Comments)   Thrush in mouth        Medication List        Accurate as of May 02, 2023 10:59 AM. If you have any questions, ask your nurse or doctor.          amLODipine 5 MG tablet Commonly known as: NORVASC Take 1 tablet (5 mg total) by mouth daily.   atorvastatin 20 MG tablet Commonly known as: LIPITOR Take 1 tablet (20 mg total) by mouth daily. NO REFILLS.  NEEDS AN APPT BEFORE NEXT RF REQUEST   budesonide 3 MG 24 hr capsule Commonly known as: ENTOCORT EC Take 3 mg by mouth daily.   DULoxetine 60 MG capsule Commonly known as: CYMBALTA TAKE 1 CAPSULE DAILY   hydrochlorothiazide 12.5 MG capsule Commonly known as: MICROZIDE Take 1 capsule (12.5 mg total) by mouth daily. NO REFILLS. NEEDS AN APPT BEFORE NEXT RF REQUEST   Linzess 72 MCG capsule Generic drug: linaclotide Take 72 mcg by mouth daily as needed  (constipation).   MAGNESIUM PO Take 1 tablet by mouth daily.   multivitamin with minerals Tabs tablet Take 1 tablet by mouth daily.   oxyCODONE 5 MG immediate release tablet Commonly known as: Roxicodone Take 1 tablet (5 mg total) by mouth every 4 (four) hours as needed for severe pain.   POTASSIUM PO Take 1 tablet by mouth daily.   pregabalin 75 MG capsule Commonly known as: LYRICA TAKE 1 CAPSULE AT BEDTIME   tiZANidine 2 MG tablet Commonly known as: ZANAFLEX Take 1 tablet (2 mg total) by mouth every 6 (six) hours as needed for muscle spasms.   traZODone 100 MG tablet Commonly known as: DESYREL TAKE 1 TABLET AT BEDTIME   VITAMIN D3 PO Take 1 tablet by mouth daily.        History (reviewed): Past Medical History:  Diagnosis Date   Anxiety    Basal cell carcinoma 01/2018   "upper lip"   Chronic diarrhea    Complication of anesthesia 1989   diff to wake up with gallbaldder surgery- no problems since (03/12/2018)   Connective tissue disorder (HCC)    "lupus"   Depression    Dysrhythmia     states at times, last occurence was january 2019 feels like heart fluttering , last a couple seconds, denies any associated cardiac symtpoms. rpeorts at her last annual check up her pcp Metheney  heard it on pysical  exam and ordred an ECHO , per patietn no one reached out to her to set it up    GERD (gastroesophageal reflux disease)    Heart murmur    High cholesterol    History of blood transfusion 1970   "related to childbirth"   Hypertension    IBS (irritable bowel syndrome)    Migraine    "none since ~ 1980" (03/12/2018)   Neuromuscular disorder (HCC)    lupus   Osteoarthritis    "all my joints" (03/12/2018)   Squamous cell carcinoma    "right arm"   Past Surgical History:  Procedure Laterality Date   BASAL CELL CARCINOMA EXCISION  01/2018   "upper lip"   BREAST BIOPSY Left     neg   BREAST CYST ASPIRATION Right    CATARACT EXTRACTION W/ INTRAOCULAR LENS   IMPLANT, BILATERAL     CHOLECYSTECTOMY OPEN  1989   COLONOSCOPY     DILATION AND CURETTAGE OF UTERUS     JOINT REPLACEMENT     KNEE ARTHROSCOPY Bilateral    NASAL SINUS SURGERY     "severe infection"   REVERSE SHOULDER ARTHROPLASTY Right 03/09/2023   Procedure: REVERSE SHOULDER ARTHROPLASTY;  Surgeon: Jones Broom, MD;  Location: [facility];  Service: Orthopedics;  Laterality: Right;   SHOULDER ARTHROSCOPY Left 12/2014   Dr. Luiz Iron CELL CARCINOMA EXCISION Right    arm   TOTAL HIP ARTHROPLASTY Right 08/31/2016   Procedure: TOTAL HIP ARTHROPLASTY ANTERIOR APPROACH;  Surgeon: Gean Birchwood, MD;  Location: [facility];  Service: Orthopedics;  Laterality: Right;   TOTAL KNEE ARTHROPLASTY Right 03/12/2018   Procedure: TOTAL KNEE ARTHROPLASTY;  Surgeon: Gean Birchwood, MD;  Location: [facility] OR;  Service: Orthopedics;  Laterality: Right;   TOTAL KNEE ARTHROPLASTY     TOTAL KNEE ARTHROPLASTY Left 01/15/2019   Procedure: TOTAL KNEE ARTHROPLASTY;  Surgeon: Marcene Corning, MD;  Location: [facility];  Service: Orthopedics;  Laterality: Left;   VARICOSE VEIN SURGERY Bilateral    Family History  Problem Relation Age of Onset   Heart disease Mother    Heart attack Mother    Hyperlipidemia Father    Other Father        amputaiton   Hyperlipidemia Brother    Hypertension Brother    Social History   Socioeconomic History   Marital status: Married    Spouse name: Jimmie   Number of children: 4   Years of education: 15   Highest education level: Some college, no degree  Occupational History   Occupation: retired    Comment: med assist, Teacher, adult education  Tobacco Use   Smoking status: Former    Packs/day: 1.00    Years: 5.00    Additional pack years: 0.00    Total pack years: 5.00    Types: Cigarettes    Quit date: 1989    Years since quitting: 35.4   Smokeless tobacco: Never  Vaping Use   Vaping Use: Never used  Substance and Sexual Activity   Alcohol use: No   Drug use: No   Sexual  activity: Not on file  Other Topics Concern   Not on file  Social History Narrative   Lives with her spouse. She enjoys crafting.    Social Determinants of Health   Financial Resource Strain: Low Risk  (05/01/2023)   Overall Financial Resource Strain (CARDIA)    Difficulty of Paying Living Expenses: Not hard at all  Food Insecurity: No Food Insecurity (05/01/2023)   Hunger Vital Sign    Worried About Running Out of Food in the Last Year: Never true    Ran Out of Food in the Last Year: Never true  Transportation Needs: No Transportation Needs (05/01/2023)   PRAPARE - Administrator, Civil Service (Medical): No    Lack of Transportation (Non-Medical): No  Physical Activity: Insufficiently Active (05/01/2023)   Exercise Vital Sign    Days of Exercise per Week: 1 day    Minutes of Exercise per Session: 20 min  Stress: No Stress Concern Present (05/01/2023)   Katherine Nixon of Occupational Health - Occupational Stress Questionnaire    Feeling of Stress : Not at all  Social Connections: Moderately Isolated (05/02/2023)   Social Connection and Isolation Panel [NHANES]    Frequency of Communication with Friends and Family: Once a week    Frequency of Social Gatherings with Friends and Family: Never    Attends Religious Services: More than 4 times per year    Active Member of [employer] or Organizations: No    Attends Banker Meetings: Never    Marital Status: Married    Activities of Daily Living    05/02/2023   10:50 AM 05/01/2023   12:45 PM  In your present state of health, do you have any difficulty performing the following activities:  Hearing?  0  Vision?  0  Difficulty concentrating or making decisions?  1  Walking or climbing stairs?  1  Dressing or bathing?  0  Doing errands, shopping? 0   Preparing Food and eating ?  N  Using the Toilet?  N  In the past six months, have you accidently leaked urine?  N  Do you have problems with loss of bowel control?   N  Managing your Medications?  N  Managing your Finances?  N  Housekeeping or managing your Housekeeping?  N    Patient Education/ Literacy How often do you need to have someone help you when you read instructions, pamphlets, or other written materials from your doctor or pharmacy?: 1 - Never What is the last grade level you completed in school?: associates degree  Exercise Current Exercise Habits: Home exercise routine, Type of exercise: walking;Other - see comments (PT exercise), Time (Minutes): 20, Frequency (Times/Week): 7, Weekly Exercise (Minutes/Week): 140, Intensity: Mild  Diet Patient reports consuming 3 meals a day and 0 snack(s) a day Patient reports that her primary diet is: Regular Patient reports that she does have regular access to food.   Depression Screen    05/02/2023   10:49 AM 05/26/2022   10:58 AM 04/27/2022   11:04 AM 04/27/2021   11:21 AM 02/10/2021    9:05 AM 01/08/2020   10:06 AM 04/10/2019   10:23 AM  PHQ 2/9 Scores  PHQ - 2 Score 0 0 0 0 0 0 0  PHQ- 9 Score  4          Fall Risk    05/02/2023   10:49 AM 05/01/2023   12:45 PM 05/26/2022   10:57 AM 04/27/2022   11:03 AM 01/20/2022   10:22 AM  Fall Risk   Falls in the past year? 1 1 1 1  0  Number falls in past yr: 1 1 1 1  0  Injury with Fall? 1 1 1 1  0  Risk for fall due to : Orthopedic patient;History of fall(s)  History of fall(s) History of fall(s) No Fall Risks  Follow up Falls evaluation completed;Education provided;Falls prevention discussed  Falls prevention discussed Falls evaluation completed;Education provided;Falls prevention discussed Falls prevention discussed     Objective:  Katherine Nixon seemed alert and oriented and she participated appropriately during our telephone visit.  Blood Pressure Weight BMI  BP Readings from Last 3 Encounters:  03/09/23 (!) 125/52  02/23/23 (!) 144/86  10/27/22 (!) 140/63   Wt Readings from Last 3 Encounters:  03/09/23 209 lb (94.8 kg)  02/23/23 209 lb  (94.8 kg)  10/27/22 202 lb (91.6 kg)   BMI Readings from Last 1 Encounters:  03/09/23 34.78 kg/m    *Unable to obtain current vital signs, weight, and BMI due to telephone visit type  Hearing/Vision  Katherine Nixon did not seem to have difficulty with hearing/understanding during the telephone conversation Reports that she has not had a formal eye exam by an eye care professional within the past year Reports that she has not had a formal hearing evaluation within the past year *Unable to fully assess hearing and vision during telephone visit type  Cognitive Function:    05/02/2023   10:52 AM 04/27/2022   11:11 AM 02/10/2021    9:13 AM 01/08/2020   10:10 AM  6CIT Screen  What Year? 0 points 0 points 0 points 0 points  What month? 0 points 0 points 0 points 0 points  What time? 0 points 0 points 0 points 0 points  Count back from 20 0 points 0 points 0 points 0 points  Months in reverse 0 points 0 points 0 points 0 points  Repeat phrase 0 points 0 points 0 points 0  points  Total Score 0 points 0 points 0 points 0 points   (Normal:0-7, Significant for Dysfunction: >8)  Normal Cognitive Function Screening: Yes   Immunization & Health Maintenance Record Immunization History  Administered Date(s) Administered   Fluad Quad(high Dose 65+) 11/09/2021, 10/27/2022   Influenza Split 09/01/2011   Influenza, High Dose Seasonal PF 07/13/2017, 10/10/2018   Influenza,inj,Quad PF,6+ Mos 12/30/2014, 09/23/2015   Influenza-Unspecified 09/01/2011, 12/30/2014, 09/23/2015, 08/18/2016   PFIZER(Purple Top)SARS-COV-2 Vaccination 07/14/2020, 08/04/2020   Pneumococcal Conjugate-13 02/10/2015   Pneumococcal Polysaccharide-23 06/22/2012   Tdap 12/28/2017   Zoster, Live 05/26/2014    Health Maintenance  Topic Date Due   COVID-19 Vaccine (3 - 2023-24 season) 05/18/2023 (Originally 07/22/2022)   Zoster Vaccines- Shingrix (1 of 2) 08/02/2023 (Originally 10/22/1996)   INFLUENZA VACCINE  06/22/2023   Medicare  Annual Wellness (AWV)  05/01/2024   Colonoscopy  06/30/2027   DTaP/Tdap/Td (2 - Td or Tdap) 12/29/2027   Pneumonia Vaccine 5+ Years old  Completed   Hepatitis C Screening  Completed   HPV VACCINES  Aged Out       Assessment  This is a routine wellness examination for [employer].  Health Maintenance: Due or Overdue There are no preventive care reminders to display for this patient.   Kriya Jan does not need a referral for Community Assistance: Care Management:   no Social Work:    no Prescription Assistance:  no Nutrition/Diabetes Education:  no   Plan:  Personalized Goals  Goals Addressed               This Visit's Progress     Patient Stated (pt-stated)        Patient stated that she would like to loose weight.       Personalized Health Maintenance & Screening Recommendations  Shingles vaccine  Lung Cancer Screening Recommended: no (Low Dose CT Chest recommended if Age 60-80 years, 20 pack-year currently smoking OR have quit w/in past 15 years) Hepatitis C Screening recommended: no HIV Screening recommended: no  Advanced Directives: Written information was not prepared per patient's request.  Referrals & Orders No orders of the defined types were placed in this encounter.   Follow-up Plan Follow-up with Agapito Games, MD as planned Schedule Shingles vaccine at the pharmacy.  Medicare wellness visit in one year.  Patient will access AVS on my chart.    I have personally reviewed and noted the following in the patient's chart:   Medical and social history Use of alcohol, tobacco or illicit drugs  Current medications and supplements Functional ability and status Nutritional status Physical activity Advanced directives List of other physicians Hospitalizations, surgeries, and ER visits in previous 12 months Vitals Screenings to include cognitive, depression, and falls Referrals and appointments  In addition, I have  reviewed and discussed with Katherine Nixon certain preventive protocols, quality metrics, and best practice recommendations. A written personalized care plan for preventive services as well as general preventive health recommendations is available and can be mailed to the patient at her request.      Modesto Charon, RN BSN  05/02/2023

## 2023-05-02 NOTE — Patient Instructions (Addendum)
MEDICARE ANNUAL WELLNESS VISIT Health Maintenance Summary and Written Plan of Care  Katherine Nixon ,  Thank you for allowing me to perform your Medicare Annual Wellness Visit and for your ongoing commitment to your health.   Health Maintenance & Immunization History Health Maintenance  Topic Date Due   COVID-19 Vaccine (3 - 2023-24 season) 05/18/2023 (Originally 07/22/2022)   Zoster Vaccines- Shingrix (1 of 2) 08/02/2023 (Originally 10/22/1996)   INFLUENZA VACCINE  06/22/2023   Medicare Annual Wellness (AWV)  05/01/2024   Colonoscopy  06/30/2027   DTaP/Tdap/Td (2 - Td or Tdap) 12/29/2027   Pneumonia Vaccine 29+ Years old  Completed   Hepatitis C Screening  Completed   HPV VACCINES  Aged Out   Immunization History  Administered Date(s) Administered   Fluad Quad(high Dose 65+) 11/09/2021, 10/27/2022   Influenza Split 09/01/2011   Influenza, High Dose Seasonal PF 07/13/2017, 10/10/2018   Influenza,inj,Quad PF,6+ Mos 12/30/2014, 09/23/2015   Influenza-Unspecified 09/01/2011, 12/30/2014, 09/23/2015, 08/18/2016   PFIZER(Purple Top)SARS-COV-2 Vaccination 07/14/2020, 08/04/2020   Pneumococcal Conjugate-13 02/10/2015   Pneumococcal Polysaccharide-23 06/22/2012   Tdap 12/28/2017   Zoster, Live 05/26/2014    These are the patient goals that we discussed:  Goals Addressed               This Visit's Progress     Patient Stated (pt-stated)        Patient stated that she would like to loose weight.         This is a list of Health Maintenance Items that are overdue or due now: Shingles vaccine  Orders/Referrals Placed Today: No orders of the defined types were placed in this encounter.  (Contact our referral department at [phone] if you have not spoken with someone about your referral appointment within the next 5 days)    Follow-up Plan Follow-up with Agapito Games, MD as planned Schedule Shingles vaccine at the pharmacy.  Medicare wellness visit in one year.   Patient will access AVS on my chart.      Health Maintenance, Female Adopting a healthy lifestyle and getting preventive care are important in promoting health and wellness. Ask your health care provider about: The right schedule for you to have regular tests and exams. Things you can do on your own to prevent diseases and keep yourself healthy. What should I know about diet, weight, and exercise? Eat a healthy diet  Eat a diet that includes plenty of vegetables, fruits, low-fat dairy products, and lean protein. Do not eat a lot of foods that are high in solid fats, added sugars, or sodium. Maintain a healthy weight Body mass index (BMI) is used to identify weight problems. It estimates body fat based on height and weight. Your health care provider can help determine your BMI and help you achieve or maintain a healthy weight. Get regular exercise Get regular exercise. This is one of the most important things you can do for your health. Most adults should: Exercise for at least 150 minutes each week. The exercise should increase your heart rate and make you sweat (moderate-intensity exercise). Do strengthening exercises at least twice a week. This is in addition to the moderate-intensity exercise. Spend less time sitting. Even light physical activity can be beneficial. Watch cholesterol and blood lipids Have your blood tested for lipids and cholesterol at 77 years of age, then have this test every 5 years. Have your cholesterol levels checked more often if: Your lipid or cholesterol levels are high. You are older than  77 years of age. You are at high risk for heart disease. What should I know about cancer screening? Depending on your health history and family history, you may need to have cancer screening at various ages. This may include screening for: Breast cancer. Cervical cancer. Colorectal cancer. Skin cancer. Lung cancer. What should I know about heart disease, diabetes, and  high blood pressure? Blood pressure and heart disease High blood pressure causes heart disease and increases the risk of stroke. This is more likely to develop in people who have high blood pressure readings or are overweight. Have your blood pressure checked: Every 3-5 years if you are 95-61 years of age. Every year if you are 36 years old or older. Diabetes Have regular diabetes screenings. This checks your fasting blood sugar level. Have the screening done: Once every three years after age 53 if you are at a normal weight and have a low risk for diabetes. More often and at a younger age if you are overweight or have a high risk for diabetes. What should I know about preventing infection? Hepatitis B If you have a higher risk for hepatitis B, you should be screened for this virus. Talk with your health care provider to find out if you are at risk for hepatitis B infection. Hepatitis C Testing is recommended for: Everyone born from [birth year] through 1965. Anyone with known risk factors for hepatitis C. Sexually transmitted infections (STIs) Get screened for STIs, including gonorrhea and chlamydia, if: You are sexually active and are younger than 77 years of age. You are older than 77 years of age and your health care provider tells you that you are at risk for this type of infection. Your sexual activity has changed since you were last screened, and you are at increased risk for chlamydia or gonorrhea. Ask your health care provider if you are at risk. Ask your health care provider about whether you are at high risk for HIV. Your health care provider may recommend a prescription medicine to help prevent HIV infection. If you choose to take medicine to prevent HIV, you should first get tested for HIV. You should then be tested every 3 months for as long as you are taking the medicine. Pregnancy If you are about to stop having your period (premenopausal) and you may become pregnant, seek counseling  before you get pregnant. Take 400 to 800 micrograms (mcg) of folic acid every day if you become pregnant. Ask for birth control (contraception) if you want to prevent pregnancy. Osteoporosis and menopause Osteoporosis is a disease in which the bones lose minerals and strength with aging. This can result in bone fractures. If you are 51 years old or older, or if you are at risk for osteoporosis and fractures, ask your health care provider if you should: Be screened for bone loss. Take a calcium or vitamin D supplement to lower your risk of fractures. Be given hormone replacement therapy (HRT) to treat symptoms of menopause. Follow these instructions at home: Alcohol use Do not drink alcohol if: Your health care provider tells you not to drink. You are pregnant, may be pregnant, or are planning to become pregnant. If you drink alcohol: Limit how much you have to: 0-1 drink a day. Know how much alcohol is in your drink. In the [place] one drink equals one 12 oz bottle of beer (355 mL), one 5 oz glass of wine (148 mL), or one 1 oz glass of hard liquor (44 mL). Lifestyle Do not  use any products that contain nicotine or tobacco. These products include cigarettes, chewing tobacco, and vaping devices, such as e-cigarettes. If you need help quitting, ask your health care provider. Do not use street drugs. Do not share needles. Ask your health care provider for help if you need support or information about quitting drugs. General instructions Schedule regular health, dental, and eye exams. Stay current with your vaccines. Tell your health care provider if: You often feel depressed. You have ever been abused or do not feel safe at home. Summary Adopting a healthy lifestyle and getting preventive care are important in promoting health and wellness. Follow your health care provider's instructions about healthy diet, exercising, and getting tested or screened for diseases. Follow your health care  provider's instructions on monitoring your cholesterol and blood pressure. This information is not intended to replace advice given to you by your health care provider. Make sure you discuss any questions you have with your health care provider. Document Revised: 03/29/2021 Document Reviewed: 03/29/2021 Elsevier Patient Education  2024 [employer].

## 2023-05-03 DIAGNOSIS — Z96611 Presence of right artificial shoulder joint: Secondary | ICD-10-CM | POA: Diagnosis not present

## 2023-05-03 DIAGNOSIS — R531 Weakness: Secondary | ICD-10-CM | POA: Diagnosis not present

## 2023-05-03 DIAGNOSIS — M25611 Stiffness of right shoulder, not elsewhere classified: Secondary | ICD-10-CM | POA: Diagnosis not present

## 2023-05-05 ENCOUNTER — Encounter: Payer: Self-pay | Admitting: Physician Assistant

## 2023-05-05 ENCOUNTER — Ambulatory Visit (INDEPENDENT_AMBULATORY_CARE_PROVIDER_SITE_OTHER): Payer: Medicare Other | Admitting: Physician Assistant

## 2023-05-05 VITALS — BP 124/49 | HR 74 | Ht 65.0 in | Wt 210.0 lb

## 2023-05-05 DIAGNOSIS — I872 Venous insufficiency (chronic) (peripheral): Secondary | ICD-10-CM

## 2023-05-05 DIAGNOSIS — R635 Abnormal weight gain: Secondary | ICD-10-CM | POA: Diagnosis not present

## 2023-05-05 DIAGNOSIS — R7301 Impaired fasting glucose: Secondary | ICD-10-CM | POA: Diagnosis not present

## 2023-05-05 DIAGNOSIS — R600 Localized edema: Secondary | ICD-10-CM | POA: Insufficient documentation

## 2023-05-05 DIAGNOSIS — E6609 Other obesity due to excess calories: Secondary | ICD-10-CM

## 2023-05-05 DIAGNOSIS — E559 Vitamin D deficiency, unspecified: Secondary | ICD-10-CM

## 2023-05-05 LAB — POCT URINALYSIS DIP (CLINITEK)
Bilirubin, UA: NEGATIVE
Blood, UA: NEGATIVE
Glucose, UA: NEGATIVE mg/dL
Ketones, POC UA: NEGATIVE mg/dL
Nitrite, UA: NEGATIVE
POC PROTEIN,UA: NEGATIVE
Spec Grav, UA: 1.025 (ref 1.010–1.025)
Urobilinogen, UA: 0.2 E.U./dL
pH, UA: 5.5 (ref 5.0–8.0)

## 2023-05-05 MED ORDER — HYDROCHLOROTHIAZIDE 12.5 MG PO TABS
25.0000 mg | ORAL_TABLET | Freq: Every day | ORAL | 0 refills | Status: DC
Start: 2023-05-05 — End: 2023-07-26

## 2023-05-05 MED ORDER — VITAMIN D (ERGOCALCIFEROL) 1.25 MG (50000 UNIT) PO CAPS
50000.0000 [IU] | ORAL_CAPSULE | ORAL | 0 refills | Status: DC
Start: 2023-05-05 — End: 2023-08-07

## 2023-05-05 NOTE — Patient Instructions (Signed)
Bilateral unna boots take off in 3 days Get labs Increase HcTZ to 25mg  daily Recheck in 1 week

## 2023-05-05 NOTE — Progress Notes (Unsigned)
Acute Office Visit  Subjective:     Patient ID: Katherine Nixon, female    DOB: [birth year], 77 y.o.   MRN: [id number]  Chief Complaint  Patient presents with   Leg Swelling    BiLat   Leg Pain    BiLat    HPI Patient is in today for bilateral lower extremity swelling. Pt has pmh of HTN, chronic venous insufficiency and varicose veins. She has noticed for the last week more lower leg swelling but noticed weight gain since December. She has gained about 8lbs. She is taking hydrochlorothiazide 12.5mg  daily. No CP, palpitations, or SOB. She does not feel like her diet has changed. Last Echo 2020 with no concerns. She denies any injury to legs.  .. Active Ambulatory Problems    Diagnosis Date Noted   Urinary incontinence 03/25/2011   Essential hypertension, benign 05/09/2011   GERD (gastroesophageal reflux disease) 05/09/2011   Varicose veins 05/09/2011   Atrophic vaginitis 06/01/2011   Hyperlipemia 06/02/2011   Chronic venous insufficiency 12/14/2012   Systemic lupus erythematosus (SLE) in remission (HCC) 05/29/2013   Osteoarthritis, hip, bilateral 10/14/2013   Osteoarthritis of both knees 11/11/2013   History of arthroscopy of left shoulder 01/17/2014   Actinic porokeratosis 02/27/2014   Insomnia 04/30/2014   Dyssomnia 06/15/2014   Left shoulder pain 06/15/2014   ANA positive 01/24/2014   Dry mouth 06/15/2014   DDD (degenerative disc disease), lumbar 06/15/2014   Bilateral carpal tunnel syndrome 01/19/2015   Chronic pain syndrome 02/06/2015   Right shoulder pain 02/23/2015   Primary osteoarthritis of both first carpometacarpal joints 09/22/2015   Elevated alkaline phosphatase level 11/24/2015   Primary localized osteoarthritis of right hip 08/31/2016   Arthritis of right hip 08/31/2016   Irritable bowel syndrome with both constipation and diarrhea 12/26/2017   Degenerative arthritis of left knee 03/08/2018   Primary osteoarthritis of right knee 03/12/2018   Anxiety  state 03/12/2018   Chest pain syndrome 03/12/2018   Pain of right sacroiliac joint 04/04/2018   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   Balance problem 10/11/2018   Heart murmur 10/11/2018   Primary osteoarthritis of left knee 01/15/2019   Left flank pain 02/11/2020   Venous stasis 03/03/2020   Abnormal weight gain 05/05/2023   Bilateral lower extremity edema 05/05/2023   Elevated fasting glucose 05/05/2023   Prediabetes 05/08/2023   Class 1 obesity due to excess calories without serious comorbidity with body mass index (BMI) of 34.0 to 34.9 in adult 05/08/2023   Resolved Ambulatory Problems    Diagnosis Date Noted   Contact dermatitis 03/25/2011   Depressive disorder, not elsewhere classified 05/09/2011   Other and unspecified hyperlipidemia 05/23/2012   Metatarsalgia of left foot 12/24/2012   Lumbar degenerative disc disease 11/11/2013   Strain of gastrocnemius tendon of right lower extremity 12/16/2013   Arthritis of knee, degenerative 06/15/2014   Constipation, chronic 03/10/2016   Chronic idiopathic constipation 05/03/2018   Sleep disturbance 06/15/2014   Displaced fracture of distal phalanx of left index finger, initial encounter for closed fracture 07/17/2020   Past Medical History:  Diagnosis Date   Anxiety    Basal cell carcinoma 01/2018   Chronic diarrhea    Complication of anesthesia 1989   Connective tissue disorder (HCC)    Depression    Dysrhythmia    High cholesterol    History of blood transfusion 1970   Hypertension    IBS (irritable bowel syndrome)    Migraine    Neuromuscular disorder (  HCC)    Osteoarthritis    Squamous cell carcinoma      ROS See HPI.      Objective:    BP (!) 124/49 (BP Location: Left Arm, Patient Position: Sitting, Cuff Size: Large)   Pulse 74   Ht 5\' 5"  (1.651 m)   Wt 210 lb (95.3 kg)   SpO2 93%   BMI 34.95 kg/m  BP Readings from Last 3 Encounters:  05/05/23 (!) 124/49  03/09/23 (!) 125/52  02/23/23 (!) 144/86    Wt Readings from Last 3 Encounters:  05/05/23 210 lb (95.3 kg)  03/09/23 209 lb (94.8 kg)  02/23/23 209 lb (94.8 kg)    .Marland Kitchen Results for orders placed or performed in visit on 05/05/23  Brain natriuretic peptide  Result Value Ref Range   Brain Natriuretic Peptide 65 <100 pg/mL  COMPLETE METABOLIC PANEL WITH GFR  Result Value Ref Range   Glucose, Bld 110 (H) 65 - 99 mg/dL   BUN 23 7 - 25 mg/dL   Creat 1.61 0.96 - 0.45 mg/dL   eGFR 58 (L) > OR = 60 mL/min/1.43m2   BUN/Creatinine Ratio SEE NOTE: 6 - 22 (calc)   Sodium 142 135 - 146 mmol/L   Potassium 4.4 3.5 - 5.3 mmol/L   Chloride 107 98 - 110 mmol/L   CO2 27 20 - 32 mmol/L   Calcium 10.4 8.6 - 10.4 mg/dL   Total Protein 7.3 6.1 - 8.1 g/dL   Albumin 4.2 3.6 - 5.1 g/dL   Globulin 3.1 1.9 - 3.7 g/dL (calc)   AG Ratio 1.4 1.0 - 2.5 (calc)   Total Bilirubin 0.4 0.2 - 1.2 mg/dL   Alkaline phosphatase (APISO) 107 37 - 153 U/L   AST 24 10 - 35 U/L   ALT 15 6 - 29 U/L  TSH  Result Value Ref Range   TSH 0.90 0.40 - 4.50 mIU/L  Hemoglobin A1c  Result Value Ref Range   Hgb A1c MFr Bld 5.9 (H) <5.7 % of total Hgb   Mean Plasma Glucose 123 mg/dL   eAG (mmol/L) 6.8 mmol/L  POCT URINALYSIS DIP (CLINITEK)  Result Value Ref Range   Color, UA yellow yellow   Clarity, UA clear clear   Glucose, UA negative negative mg/dL   Bilirubin, UA negative negative   Ketones, POC UA negative negative mg/dL   Spec Grav, UA 4.098 1.191 - 1.025   Blood, UA negative negative   pH, UA 5.5 5.0 - 8.0   POC PROTEIN,UA negative negative, trace   Urobilinogen, UA 0.2 0.2 or 1.0 E.U./dL   Nitrite, UA Negative Negative   Leukocytes, UA Small (1+) (A) Negative     Physical Exam Constitutional:      Appearance: Normal appearance. She is obese.  HENT:     Head: Normocephalic.  Cardiovascular:     Rate and Rhythm: Normal rate.     Pulses: Normal pulses.  Pulmonary:     Effort: Pulmonary effort is normal.     Breath sounds: Normal breath sounds.   Musculoskeletal:     Right lower leg: Edema present.     Left lower leg: Edema present.     Comments: 2+ pitting edema bilateral legs with erythema and tenderness anterior bilateral legs up to mid leg.   Neurological:     General: No focal deficit present.     Mental Status: She is alert.  Psychiatric:        Mood and Affect: Mood normal.  Assessment & Plan:  Marland KitchenMarland KitchenChriston was seen today for leg swelling and leg pain.  Diagnoses and all orders for this visit:  Bilateral lower extremity edema -     Brain natriuretic peptide -     COMPLETE METABOLIC PANEL WITH GFR -     TSH -     hydrochlorothiazide (HYDRODIURIL) 12.5 MG tablet; Take 2 tablets (25 mg total) by mouth daily. -     POCT URINALYSIS DIP (CLINITEK)  Abnormal weight gain -     Brain natriuretic peptide -     COMPLETE METABOLIC PANEL WITH GFR -     TSH -     Hemoglobin A1c  Chronic venous insufficiency -     hydrochlorothiazide (HYDRODIURIL) 12.5 MG tablet; Take 2 tablets (25 mg total) by mouth daily.  Elevated fasting glucose -     Hemoglobin A1c  Vitamin D deficiency -     Vitamin D, Ergocalciferol, (DRISDOL) 1.25 MG (50000 UNIT) CAPS capsule; Take 1 capsule (50,000 Units total) by mouth every 7 (seven) days. Take for 8 total doses(weeks)  Class 1 obesity due to excess calories without serious comorbidity with body mass index (BMI) of 34.0 to 34.9 in adult   Likely edema due to flare in chronic venous stasis Wrapped in unna boots today take off in 3 days Continue compression stockings Follow up in 1 week Increase HcTZ to 25mg  daily Low sodium diet Labs ordered to check kidney function, TSH, BNP No protein in urine .Marland KitchenDiscussed low carb diet with 1500 calories and 80g of protein.  Exercising at least 150 minutes a week.  [employer] could be a Chief Technology Officer.  Will screen for diabetes Will discuss medication at 1 week follow up  Return in about 1 week (around 05/12/2023) for with me.  Tandy Gaw, PA-C

## 2023-05-06 LAB — COMPLETE METABOLIC PANEL WITH GFR
AG Ratio: 1.4 (calc) (ref 1.0–2.5)
ALT: 15 U/L (ref 6–29)
AST: 24 U/L (ref 10–35)
Albumin: 4.2 g/dL (ref 3.6–5.1)
Alkaline phosphatase (APISO): 107 U/L (ref 37–153)
BUN: 23 mg/dL (ref 7–25)
CO2: 27 mmol/L (ref 20–32)
Calcium: 10.4 mg/dL (ref 8.6–10.4)
Chloride: 107 mmol/L (ref 98–110)
Creat: 1 mg/dL (ref 0.60–1.00)
Globulin: 3.1 g/dL (calc) (ref 1.9–3.7)
Glucose, Bld: 110 mg/dL — ABNORMAL HIGH (ref 65–99)
Potassium: 4.4 mmol/L (ref 3.5–5.3)
Sodium: 142 mmol/L (ref 135–146)
Total Bilirubin: 0.4 mg/dL (ref 0.2–1.2)
Total Protein: 7.3 g/dL (ref 6.1–8.1)
eGFR: 58 mL/min/{1.73_m2} — ABNORMAL LOW (ref >=60)

## 2023-05-06 LAB — HEMOGLOBIN A1C
Hgb A1c MFr Bld: 5.9 % of total Hgb — ABNORMAL HIGH (ref <5.7)
Mean Plasma Glucose: 123 mg/dL
eAG (mmol/L): 6.8 mmol/L

## 2023-05-06 LAB — BRAIN NATRIURETIC PEPTIDE: Brain Natriuretic Peptide: 65 pg/mL (ref <100)

## 2023-05-06 LAB — TSH: TSH: 0.9 mIU/L (ref 0.40–4.50)

## 2023-05-08 ENCOUNTER — Encounter: Payer: Self-pay | Admitting: Physician Assistant

## 2023-05-08 DIAGNOSIS — R531 Weakness: Secondary | ICD-10-CM | POA: Diagnosis not present

## 2023-05-08 DIAGNOSIS — M25611 Stiffness of right shoulder, not elsewhere classified: Secondary | ICD-10-CM | POA: Diagnosis not present

## 2023-05-08 DIAGNOSIS — Z96611 Presence of right artificial shoulder joint: Secondary | ICD-10-CM | POA: Diagnosis not present

## 2023-05-08 DIAGNOSIS — E6609 Other obesity due to excess calories: Secondary | ICD-10-CM | POA: Insufficient documentation

## 2023-05-08 DIAGNOSIS — R7303 Prediabetes: Secondary | ICD-10-CM | POA: Insufficient documentation

## 2023-05-08 NOTE — Progress Notes (Signed)
Katherine Nixon,   Thyroid normal.  Normal BNP suggesting no abnormal fluid accumulation around heart.  A1C trending up and in pre-diabetes range.  Kidneys look a little dry. Make sure you are drinking enough water.  Pre-diabetes could give [place] data that would help insurance pay for weight loss medication. Will discuss at follow up appt on swelling.

## 2023-05-11 DIAGNOSIS — M25611 Stiffness of right shoulder, not elsewhere classified: Secondary | ICD-10-CM | POA: Diagnosis not present

## 2023-05-11 DIAGNOSIS — Z96611 Presence of right artificial shoulder joint: Secondary | ICD-10-CM | POA: Diagnosis not present

## 2023-05-11 DIAGNOSIS — R531 Weakness: Secondary | ICD-10-CM | POA: Diagnosis not present

## 2023-05-15 ENCOUNTER — Encounter: Payer: Self-pay | Admitting: Physician Assistant

## 2023-05-15 ENCOUNTER — Ambulatory Visit (INDEPENDENT_AMBULATORY_CARE_PROVIDER_SITE_OTHER): Payer: Medicare Other | Admitting: Physician Assistant

## 2023-05-15 VITALS — BP 128/66 | HR 68 | Ht 65.0 in | Wt 210.0 lb

## 2023-05-15 DIAGNOSIS — I10 Essential (primary) hypertension: Secondary | ICD-10-CM | POA: Diagnosis not present

## 2023-05-15 DIAGNOSIS — R7303 Prediabetes: Secondary | ICD-10-CM | POA: Diagnosis not present

## 2023-05-15 DIAGNOSIS — I872 Venous insufficiency (chronic) (peripheral): Secondary | ICD-10-CM

## 2023-05-15 DIAGNOSIS — E6609 Other obesity due to excess calories: Secondary | ICD-10-CM

## 2023-05-15 DIAGNOSIS — K5904 Chronic idiopathic constipation: Secondary | ICD-10-CM

## 2023-05-15 DIAGNOSIS — Z6834 Body mass index (BMI) 34.0-34.9, adult: Secondary | ICD-10-CM | POA: Diagnosis not present

## 2023-05-15 MED ORDER — FUROSEMIDE 20 MG PO TABS
ORAL_TABLET | ORAL | 0 refills | Status: DC
Start: 2023-05-15 — End: 2023-06-14

## 2023-05-15 MED ORDER — TRULICITY 0.75 MG/0.5ML ~~LOC~~ SOAJ
0.7500 mg | SUBCUTANEOUS | 0 refills | Status: DC
Start: 2023-05-15 — End: 2023-06-15

## 2023-05-15 NOTE — Progress Notes (Unsigned)
Established Patient Office Visit  Subjective   Patient ID: Katherine Nixon, female    DOB: 09-Jan-1946  Age: 77 y.o. MRN: [id number]  No chief complaint on file.   HPI Pt is a 77 yo obese female who presents to the clinic for bilateral  lower extremity swelling. She was seen 1 week ago and hydrochlorothiazide increased to 50mg  and placed in unna boot. Unna boot helped but as soon as it was taken off the swelling returned. Increase in hydrochlorothiazide has not really made any improvement in swelling. Denies any SOB, CP, palpitations.   Pt has known chronic venous stasis. She does not like to wear compression stockings in the heat. She has normal CMP, BMP, TSH at last visit. Not seen vascular/vein in many years but had venous surgery in the past.   Pt would like to lose weight. She is staying fairly active.   Patient Active Problem List   Diagnosis Date Noted   Prediabetes 05/08/2023   Class 1 obesity due to excess calories without serious comorbidity with body mass index (BMI) of 34.0 to 34.9 in adult 05/08/2023   Abnormal weight gain 05/05/2023   Bilateral lower extremity edema 05/05/2023   Elevated fasting glucose 05/05/2023   Venous stasis 03/03/2020   Left flank pain 02/11/2020   Primary osteoarthritis of left knee 01/15/2019   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   Balance problem 10/11/2018   Heart murmur 10/11/2018   Pain of right sacroiliac joint 04/04/2018   Primary osteoarthritis of right knee 03/12/2018   Anxiety state 03/12/2018   Chest pain syndrome 03/12/2018   Degenerative arthritis of left knee 03/08/2018   Irritable bowel syndrome with both constipation and diarrhea 12/26/2017   Primary localized osteoarthritis of right hip 08/31/2016   Arthritis of right hip 08/31/2016   Elevated alkaline phosphatase level 11/24/2015   Primary osteoarthritis of both first carpometacarpal joints 09/22/2015   Right shoulder pain 02/23/2015   Chronic pain syndrome 02/06/2015    Bilateral carpal tunnel syndrome 01/19/2015   Dyssomnia 06/15/2014   Left shoulder pain 06/15/2014   Dry mouth 06/15/2014   DDD (degenerative disc disease), lumbar 06/15/2014   Insomnia 04/30/2014   Actinic porokeratosis 02/27/2014   ANA positive 01/24/2014   History of arthroscopy of left shoulder 01/17/2014   Osteoarthritis of both knees 11/11/2013   Osteoarthritis, hip, bilateral 10/14/2013   Systemic lupus erythematosus (SLE) in remission (HCC) 05/29/2013   Chronic venous insufficiency 12/14/2012   Hyperlipemia 06/02/2011   Atrophic vaginitis 06/01/2011   Essential hypertension, benign 05/09/2011   GERD (gastroesophageal reflux disease) 05/09/2011   Varicose veins 05/09/2011   Urinary incontinence 03/25/2011   Past Medical History:  Diagnosis Date   Anxiety    Basal cell carcinoma 01/2018   "upper lip"   Chronic diarrhea    Complication of anesthesia 1989   diff to wake up with gallbaldder surgery- no problems since (03/12/2018)   Connective tissue disorder (HCC)    "lupus"   Depression    Dysrhythmia     states at times, last occurence was january 2019 feels like heart fluttering , last a couple seconds, denies any associated cardiac symtpoms. rpeorts at her last annual check up her pcp Metheney  heard it on pysical  exam and ordred an ECHO , per patietn no one reached out to her to set it up    GERD (gastroesophageal reflux disease)    Heart murmur    High cholesterol    History of blood  transfusion 1970   "related to childbirth"   Hypertension    IBS (irritable bowel syndrome)    Migraine    "none since ~ 1980" (03/12/2018)   Neuromuscular disorder (HCC)    lupus   Osteoarthritis    "all my joints" (03/12/2018)   Squamous cell carcinoma    "right arm"   Past Surgical History:  Procedure Laterality Date   BASAL CELL CARCINOMA EXCISION  01/2018   "upper lip"   BREAST BIOPSY Left     neg   BREAST CYST ASPIRATION Right    CATARACT EXTRACTION W/ INTRAOCULAR  LENS  IMPLANT, BILATERAL     CHOLECYSTECTOMY OPEN  1989   COLONOSCOPY     DILATION AND CURETTAGE OF UTERUS     JOINT REPLACEMENT     KNEE ARTHROSCOPY Bilateral    NASAL SINUS SURGERY     "severe infection"   REVERSE SHOULDER ARTHROPLASTY Right 03/09/2023   Procedure: REVERSE SHOULDER ARTHROPLASTY;  Surgeon: Jones Broom, MD;  Location: [facility];  Service: Orthopedics;  Laterality: Right;   SHOULDER ARTHROSCOPY Left 12/2014   Dr. Luiz Iron CELL CARCINOMA EXCISION Right    arm   TOTAL HIP ARTHROPLASTY Right 08/31/2016   Procedure: TOTAL HIP ARTHROPLASTY ANTERIOR APPROACH;  Surgeon: Gean Birchwood, MD;  Location: [facility] OR;  Service: Orthopedics;  Laterality: Right;   TOTAL KNEE ARTHROPLASTY Right 03/12/2018   Procedure: TOTAL KNEE ARTHROPLASTY;  Surgeon: Gean Birchwood, MD;  Location: [facility] OR;  Service: Orthopedics;  Laterality: Right;   TOTAL KNEE ARTHROPLASTY     TOTAL KNEE ARTHROPLASTY Left 01/15/2019   Procedure: TOTAL KNEE ARTHROPLASTY;  Surgeon: Marcene Corning, MD;  Location: [facility];  Service: Orthopedics;  Laterality: Left;   VARICOSE VEIN SURGERY Bilateral    Family History  Problem Relation Age of Onset   Heart disease Mother    Heart attack Mother    Hyperlipidemia Father    Other Father        amputaiton   Hyperlipidemia Brother    Hypertension Brother    Allergies  Allergen Reactions   Gabapentin Other (See Comments)    Hallucinations    Tramadol Other (See Comments)    Hallucinations    Tylenol [Acetaminophen] Other (See Comments)    Due to liver enzyme issue   Pravastatin Other (See Comments)    Muscle cramping   Sulfamethoxazole-Trimethoprim Other (See Comments)    Thrush in mouth      ROS See HPI.    Objective:     BP (!) 144/54 (BP Location: Left Arm, Patient Position: Sitting, Cuff Size: Normal)   Pulse 68   Ht 5\' 5"  (1.651 m)   Wt 210 lb (95.3 kg)   SpO2 96%   BMI 34.95 kg/m  BP Readings from Last 3 Encounters:  05/15/23 (!) 144/54   05/05/23 (!) 124/49  03/09/23 (!) 125/52   Wt Readings from Last 3 Encounters:  05/15/23 210 lb (95.3 kg)  05/05/23 210 lb (95.3 kg)  03/09/23 209 lb (94.8 kg)      Physical Exam Constitutional:      Appearance: Normal appearance. She is obese.  HENT:     Head: Normocephalic.  Cardiovascular:     Rate and Rhythm: Normal rate.  Pulmonary:     Effort: Pulmonary effort is normal.  Musculoskeletal:     Right lower leg: Edema present.     Left lower leg: Edema present.     Comments: 1+ bilateral pitting lower extremity edema  Neurological:     General: No focal deficit present.     Mental Status: She is alert and oriented to person, place, and time.  Psychiatric:        Mood and Affect: Mood normal.      Last CBC Lab Results  Component Value Date   WBC 6.8 02/23/2023   HGB 12.0 02/23/2023   HCT 37.9 02/23/2023   MCV 101.1 (H) 02/23/2023   MCH 32.0 02/23/2023   RDW 15.9 (H) 02/23/2023   PLT 219 02/23/2023   Last metabolic panel Lab Results  Component Value Date   GLUCOSE 110 (H) 05/05/2023   NA 142 05/05/2023   K 4.4 05/05/2023   CL 107 05/05/2023   CO2 27 05/05/2023   BUN 23 05/05/2023   CREATININE 1.00 05/05/2023   EGFR 58 (L) 05/05/2023   CALCIUM 10.4 05/05/2023   PROT 7.3 05/05/2023   ALBUMIN 3.9 07/11/2016   BILITOT 0.4 05/05/2023   ALKPHOS 137 (A) 10/24/2016   AST 24 05/05/2023   ALT 15 05/05/2023   ANIONGAP 9 02/23/2023   Last lipids Lab Results  Component Value Date   CHOL 158 05/26/2022   HDL 69 05/26/2022   LDLCALC 74 05/26/2022   TRIG 73 05/26/2022   CHOLHDL 2.3 05/26/2022   Last hemoglobin A1c Lab Results  Component Value Date   HGBA1C 5.9 (H) 05/05/2023   Last thyroid functions Lab Results  Component Value Date   TSH 0.90 05/05/2023      The 10-year ASCVD risk score (Arnett DK, et al., 2019) is: 28.5%    Assessment & Plan:  Marland KitchenMarland KitchenDiagnoses and all orders for this visit:  Chronic venous insufficiency -     furosemide  (LASIX) 20 MG tablet; Take one tablet daily for next 5 days and then as needed for lower extremity edema. -     Ambulatory referral to Vascular Surgery -     Dulaglutide (TRULICITY) 0.75 MG/0.5ML SOPN; Inject 0.75 mg into the skin once a week.  Chronic idiopathic constipation  Prediabetes -     Dulaglutide (TRULICITY) 0.75 MG/0.5ML SOPN; Inject 0.75 mg into the skin once a week.  Class 1 obesity due to excess calories without serious comorbidity with body mass index (BMI) of 34.0 to 34.9 in adult -     Dulaglutide (TRULICITY) 0.75 MG/0.5ML SOPN; Inject 0.75 mg into the skin once a week.  Essential hypertension, benign -     Dulaglutide (TRULICITY) 0.75 MG/0.5ML SOPN; Inject 0.75 mg into the skin once a week.   Reviewed labs at last visit A1C prediabetes and with chronic venous stasis/HTN/obesity Start trulicity Discussed side effects to include constipation Encouraged 150 minutes of exercise a week Limit sugars and carbs Follow up with PCP in 1 month  Pt does not like to wear compression stockings Referral to vein clinic Lasix given to use daily for the next 3-5 days and then as needed for swelling Keep feet elevated and limit salt Certainly weight loss could help as well     Return in about 4 weeks (around 06/12/2023).    Tandy Gaw, PA-C

## 2023-05-16 ENCOUNTER — Encounter: Payer: Self-pay | Admitting: Physician Assistant

## 2023-05-22 DIAGNOSIS — R531 Weakness: Secondary | ICD-10-CM | POA: Diagnosis not present

## 2023-05-22 DIAGNOSIS — M25611 Stiffness of right shoulder, not elsewhere classified: Secondary | ICD-10-CM | POA: Diagnosis not present

## 2023-05-22 DIAGNOSIS — Z96611 Presence of right artificial shoulder joint: Secondary | ICD-10-CM | POA: Diagnosis not present

## 2023-05-31 DIAGNOSIS — Z96611 Presence of right artificial shoulder joint: Secondary | ICD-10-CM | POA: Diagnosis not present

## 2023-05-31 DIAGNOSIS — R531 Weakness: Secondary | ICD-10-CM | POA: Diagnosis not present

## 2023-05-31 DIAGNOSIS — M25611 Stiffness of right shoulder, not elsewhere classified: Secondary | ICD-10-CM | POA: Diagnosis not present

## 2023-06-01 ENCOUNTER — Other Ambulatory Visit: Payer: Self-pay | Admitting: Family Medicine

## 2023-06-01 DIAGNOSIS — I10 Essential (primary) hypertension: Secondary | ICD-10-CM

## 2023-06-02 DIAGNOSIS — R531 Weakness: Secondary | ICD-10-CM | POA: Diagnosis not present

## 2023-06-02 DIAGNOSIS — M25611 Stiffness of right shoulder, not elsewhere classified: Secondary | ICD-10-CM | POA: Diagnosis not present

## 2023-06-02 DIAGNOSIS — Z96611 Presence of right artificial shoulder joint: Secondary | ICD-10-CM | POA: Diagnosis not present

## 2023-06-05 ENCOUNTER — Other Ambulatory Visit: Payer: Self-pay | Admitting: Family Medicine

## 2023-06-05 DIAGNOSIS — R531 Weakness: Secondary | ICD-10-CM | POA: Diagnosis not present

## 2023-06-05 DIAGNOSIS — Z96611 Presence of right artificial shoulder joint: Secondary | ICD-10-CM | POA: Diagnosis not present

## 2023-06-05 DIAGNOSIS — M25611 Stiffness of right shoulder, not elsewhere classified: Secondary | ICD-10-CM | POA: Diagnosis not present

## 2023-06-06 DIAGNOSIS — M79661 Pain in right lower leg: Secondary | ICD-10-CM | POA: Diagnosis not present

## 2023-06-06 DIAGNOSIS — M79662 Pain in left lower leg: Secondary | ICD-10-CM | POA: Diagnosis not present

## 2023-06-06 DIAGNOSIS — I83893 Varicose veins of bilateral lower extremities with other complications: Secondary | ICD-10-CM | POA: Diagnosis not present

## 2023-06-06 DIAGNOSIS — M79604 Pain in right leg: Secondary | ICD-10-CM | POA: Diagnosis not present

## 2023-06-07 DIAGNOSIS — Z96611 Presence of right artificial shoulder joint: Secondary | ICD-10-CM | POA: Diagnosis not present

## 2023-06-07 DIAGNOSIS — M25611 Stiffness of right shoulder, not elsewhere classified: Secondary | ICD-10-CM | POA: Diagnosis not present

## 2023-06-07 DIAGNOSIS — R531 Weakness: Secondary | ICD-10-CM | POA: Diagnosis not present

## 2023-06-12 ENCOUNTER — Other Ambulatory Visit: Payer: Self-pay | Admitting: Physician Assistant

## 2023-06-12 DIAGNOSIS — E559 Vitamin D deficiency, unspecified: Secondary | ICD-10-CM

## 2023-06-13 ENCOUNTER — Other Ambulatory Visit: Payer: Self-pay | Admitting: Physician Assistant

## 2023-06-13 DIAGNOSIS — I872 Venous insufficiency (chronic) (peripheral): Secondary | ICD-10-CM

## 2023-06-15 ENCOUNTER — Ambulatory Visit (INDEPENDENT_AMBULATORY_CARE_PROVIDER_SITE_OTHER): Payer: Medicare Other | Admitting: Family Medicine

## 2023-06-15 ENCOUNTER — Encounter: Payer: Self-pay | Admitting: Family Medicine

## 2023-06-15 VITALS — BP 119/49 | HR 68 | Resp 16 | Ht 65.0 in | Wt 213.1 lb

## 2023-06-15 DIAGNOSIS — I872 Venous insufficiency (chronic) (peripheral): Secondary | ICD-10-CM

## 2023-06-15 DIAGNOSIS — R7303 Prediabetes: Secondary | ICD-10-CM

## 2023-06-15 MED ORDER — METFORMIN HCL ER 500 MG PO TB24
500.0000 mg | ORAL_TABLET | Freq: Every day | ORAL | 1 refills | Status: DC
Start: 2023-06-15 — End: 2023-07-05

## 2023-06-15 NOTE — Patient Instructions (Signed)
Medications that are currently FDA approved for weight loss are Zepbound, Reginal Lutes, [place], Contrave   For now we will go ahead and start metformin once a day in the morning.  Continue to work on healthy food choices and cutting back on sweets and carbs.  Stay away from any sugary beverages or beverages with artificial sweeteners.  Mostly drink water.

## 2023-06-15 NOTE — Assessment & Plan Note (Signed)
She says she has been wearing her compression stockings she had a procedure for her veins but does not have them on today.  She also says that the vascular doctor noted that she likely has some concomitant lymphedema and that they are gena work on trying to get her some leg compression devices for home.

## 2023-06-15 NOTE — Progress Notes (Signed)
Established Patient Office Visit  Subjective   Patient ID: Katherine Nixon, female    DOB: 01-Sep-1946  Age: 77 y.o. MRN: [id number]  Chief Complaint  Patient presents with   4 wk follow up    HPI  IFG - last A1C was 5.9.  We have tried to get Trulicity approved but they recommended that she try other medications first.  She is really struggling with weight gain.  She would really like to lose about 35 pounds she is just an active because of her leg pain and has been gradually gaining weight she really wants a better quality of life.  She has struggled with her weight on and off for years.  She says she has done weight watchers several times she has done Nutrisystem for different times, she is tried multiple exercise programs.  She says she will typically lose about 10 pounds but then eventually just gained it right back.  She says the biggest issue is that she just feels hungry all the time and finds it difficult to stick to a dietary plan for very long.  She was seen by 1 my partners about 4 weeks ago for lower extremity swelling she has a history of venous stasis.  No sign of heart failure as her BNP was normal.  Was referred to [facility].  They have already done some treatments on her left leg and she is actually going back next week.    ROS    Objective:     BP (!) 119/49 (BP Location: Left Arm, Patient Position: Sitting, Cuff Size: Normal)   Pulse 68   Resp 16   Ht 5\' 5"  (1.651 m)   Wt 213 lb 1.9 oz (96.7 kg)   SpO2 99%   BMI 35.47 kg/m    Physical Exam Vitals and nursing note reviewed.  Constitutional:      Appearance: She is well-developed.  HENT:     Head: Normocephalic and atraumatic.  Cardiovascular:     Rate and Rhythm: Normal rate and regular rhythm.     Heart sounds: Normal heart sounds.  Pulmonary:     Effort: Pulmonary effort is normal.     Breath sounds: Normal breath sounds.  Musculoskeletal:     Comments: Trace pitting edema    Skin:    General: Skin is warm and dry.  Neurological:     Mental Status: She is alert and oriented to person, place, and time.  Psychiatric:        Behavior: Behavior normal.      No results found for any visits on 06/15/23.    The 10-year ASCVD risk score (Arnett DK, et al., 2019) is: 20.4%    Assessment & Plan:   Problem List Items Addressed This Visit       Cardiovascular and Mediastinum   Chronic venous insufficiency - Primary    She says she has been wearing her compression stockings she had a procedure for her veins but does not have them on today.  She also says that the vascular doctor noted that she likely has some concomitant lymphedema and that they are gena work on trying to get her some leg compression devices for home.        Other   Prediabetes    A1c recently was 5.9.  We discussed options that could help treat her prediabetes as well as potentially reduce hunger signals.  Will start with metformin.  She also will check with insurance to  see if they will cover any of the newer weight loss medications and let me know.  Wise I will plan to see her back in about 8 weeks.      Relevant Medications   metFORMIN (GLUCOPHAGE-XR) 500 MG 24 hr tablet    Return in about 2 months (around 08/16/2023) for Pre-diabetes, Wt Mgt .   I spent 25 minutes on the day of the encounter to include pre-visit record review, face-to-face time with the patient and post visit ordering of test.   Nani Gasser, MD

## 2023-06-15 NOTE — Assessment & Plan Note (Signed)
He is currently getting her varicose veins treated with laser treatment.  They have been really bothersome especially with exercise and walking.

## 2023-06-15 NOTE — Assessment & Plan Note (Signed)
A1c recently was 5.9.  We discussed options that could help treat her prediabetes as well as potentially reduce hunger signals.  Will start with metformin.  She also will check with insurance to see if they will cover any of the newer weight loss medications and let me know.  Wise I will plan to see her back in about 8 weeks.

## 2023-06-16 DIAGNOSIS — Z09 Encounter for follow-up examination after completed treatment for conditions other than malignant neoplasm: Secondary | ICD-10-CM | POA: Diagnosis not present

## 2023-06-16 DIAGNOSIS — I83892 Varicose veins of left lower extremities with other complications: Secondary | ICD-10-CM | POA: Diagnosis not present

## 2023-06-22 DIAGNOSIS — I83891 Varicose veins of right lower extremities with other complications: Secondary | ICD-10-CM | POA: Diagnosis not present

## 2023-06-30 DIAGNOSIS — I87393 Chronic venous hypertension (idiopathic) with other complications of bilateral lower extremity: Secondary | ICD-10-CM | POA: Diagnosis not present

## 2023-06-30 DIAGNOSIS — Z09 Encounter for follow-up examination after completed treatment for conditions other than malignant neoplasm: Secondary | ICD-10-CM | POA: Diagnosis not present

## 2023-06-30 DIAGNOSIS — I83892 Varicose veins of left lower extremities with other complications: Secondary | ICD-10-CM | POA: Diagnosis not present

## 2023-07-01 ENCOUNTER — Other Ambulatory Visit: Payer: Self-pay | Admitting: Family Medicine

## 2023-07-05 ENCOUNTER — Other Ambulatory Visit: Payer: Self-pay | Admitting: *Deleted

## 2023-07-05 DIAGNOSIS — R7303 Prediabetes: Secondary | ICD-10-CM

## 2023-07-05 MED ORDER — METFORMIN HCL ER 500 MG PO TB24
500.0000 mg | ORAL_TABLET | Freq: Every day | ORAL | 1 refills | Status: DC
Start: 2023-07-05 — End: 2023-12-14

## 2023-07-12 ENCOUNTER — Other Ambulatory Visit: Payer: Self-pay | Admitting: Physician Assistant

## 2023-07-12 DIAGNOSIS — I872 Venous insufficiency (chronic) (peripheral): Secondary | ICD-10-CM

## 2023-07-17 ENCOUNTER — Other Ambulatory Visit: Payer: Self-pay | Admitting: Physician Assistant

## 2023-07-17 DIAGNOSIS — R600 Localized edema: Secondary | ICD-10-CM

## 2023-07-17 DIAGNOSIS — I872 Venous insufficiency (chronic) (peripheral): Secondary | ICD-10-CM

## 2023-07-25 DIAGNOSIS — I83891 Varicose veins of right lower extremities with other complications: Secondary | ICD-10-CM | POA: Diagnosis not present

## 2023-08-02 DIAGNOSIS — I83892 Varicose veins of left lower extremities with other complications: Secondary | ICD-10-CM | POA: Diagnosis not present

## 2023-08-02 DIAGNOSIS — M79605 Pain in left leg: Secondary | ICD-10-CM | POA: Diagnosis not present

## 2023-08-07 ENCOUNTER — Ambulatory Visit (INDEPENDENT_AMBULATORY_CARE_PROVIDER_SITE_OTHER): Payer: Medicare Other | Admitting: Family Medicine

## 2023-08-07 ENCOUNTER — Encounter: Payer: Self-pay | Admitting: Family Medicine

## 2023-08-07 VITALS — BP 133/52 | HR 61 | Resp 98 | Ht 65.0 in | Wt 204.0 lb

## 2023-08-07 DIAGNOSIS — I872 Venous insufficiency (chronic) (peripheral): Secondary | ICD-10-CM | POA: Diagnosis not present

## 2023-08-07 DIAGNOSIS — I890 Lymphedema, not elsewhere classified: Secondary | ICD-10-CM | POA: Insufficient documentation

## 2023-08-07 DIAGNOSIS — R200 Anesthesia of skin: Secondary | ICD-10-CM | POA: Insufficient documentation

## 2023-08-07 DIAGNOSIS — R7303 Prediabetes: Secondary | ICD-10-CM | POA: Diagnosis not present

## 2023-08-07 DIAGNOSIS — R2689 Other abnormalities of gait and mobility: Secondary | ICD-10-CM

## 2023-08-07 DIAGNOSIS — I10 Essential (primary) hypertension: Secondary | ICD-10-CM

## 2023-08-07 DIAGNOSIS — R202 Paresthesia of skin: Secondary | ICD-10-CM

## 2023-08-07 LAB — POCT GLYCOSYLATED HEMOGLOBIN (HGB A1C): Hemoglobin A1C: 5.6 % (ref 4.0–5.6)

## 2023-08-07 NOTE — Assessment & Plan Note (Signed)
A1c looks great today at 5.6 and she is down 9 pounds which is absolutely fantastic.

## 2023-08-07 NOTE — Assessment & Plan Note (Signed)
Will check for mineral deficiencies.  We did discuss that if there is something abnormal that we can correct we certainly will.  We can also consider working up further with nerve conduction if testing comes back normal.

## 2023-08-07 NOTE — Assessment & Plan Note (Signed)
Well controlled. Continue current regimen. Follow up in  6 mo

## 2023-08-07 NOTE — Assessment & Plan Note (Signed)
She still having issues with her balance and wonders if the neuropathy could contribute we did discuss that it absolutely can contribute.  She has done formal PT for gait and balance in the past and got about a 30% improvement in her symptoms.

## 2023-08-07 NOTE — Assessment & Plan Note (Signed)
Think she would really benefit from working with the lymphedema clinic.  She lives closer to [facility] so organ going to place referral to get her in clinic there.

## 2023-08-07 NOTE — Progress Notes (Signed)
Established Patient Office Visit  Subjective   Patient ID: Katherine Nixon, female    DOB: Jun 28, 1946  Age: 76 y.o. MRN: [id number]  Chief Complaint  Patient presents with   Diabetes    HPI She did have a vein surgery which has been helpful with her legs she still has some chronic swelling more on the left versus the right.  They tried get her some compression devices at home since I do feel like some element of this is lymphedema.  She is only been wearing her compression stockings right after her vein surgery but not consistently she says it was little too hot this summer.  Always swells a little bit more than the right.  She reports that she has had numbness in both of her feet for about 4 years she is wondering if it could be contributing to her balance being off she really does not get a lot of pain with it.  Impaired fasting glucose-no increased thirst or urination. No symptoms consistent with hypoglycemia.     ROS    Objective:     BP (!) 133/52   Pulse 61   Resp (!) 98   Ht 5\' 5"  (1.651 m)   Wt 204 lb (92.5 kg)   BMI 33.95 kg/m    Physical Exam Vitals and nursing note reviewed.  Constitutional:      Appearance: Normal appearance.  HENT:     Head: Normocephalic and atraumatic.  Eyes:     Conjunctiva/sclera: Conjunctivae normal.  Cardiovascular:     Rate and Rhythm: Normal rate and regular rhythm.  Pulmonary:     Effort: Pulmonary effort is normal.     Breath sounds: Normal breath sounds.  Skin:    General: Skin is warm and dry.  Neurological:     Mental Status: She is alert.  Psychiatric:        Mood and Affect: Mood normal.      Results for orders placed or performed in visit on 08/07/23  POCT HgB A1C  Result Value Ref Range   Hemoglobin A1C 5.6 4.0 - 5.6 %   HbA1c POC (<> result, manual entry)     HbA1c, POC (prediabetic range)     HbA1c, POC (controlled diabetic range)        The 10-year ASCVD risk score (Arnett DK, et al., 2019)  is: 24.9%    Assessment & Plan:   Problem List Items Addressed This Visit       Cardiovascular and Mediastinum   Essential hypertension, benign    Well controlled. Continue current regimen. Follow up in  21mo       Relevant Orders   B12   Fe+TIBC+Fer   Vitamin B1   Vitamin B6   Chronic venous insufficiency     Other   Prediabetes - Primary    A1c looks great today at 5.6 and she is down 9 pounds which is absolutely fantastic.      Relevant Orders   POCT HgB A1C (Completed)   B12   Fe+TIBC+Fer   Vitamin B1   Vitamin B6   Numbness and tingling of both feet    Will check for mineral deficiencies.  We did discuss that if there is something abnormal that we can correct we certainly will.  We can also consider working up further with nerve conduction if testing comes back normal.      Relevant Orders   B12   Fe+TIBC+Fer   Vitamin B1  Vitamin B6   Lymph edema    Think she would really benefit from working with the lymphedema clinic.  She lives closer to [facility] so organ going to place referral to get her in clinic there.      Relevant Orders   Ambulatory referral to Hematology / Oncology   Balance problem    She still having issues with her balance and wonders if the neuropathy could contribute we did discuss that it absolutely can contribute.  She has done formal PT for gait and balance in the past and got about a 30% improvement in her symptoms.       Return in about 6 months (around 02/04/2024) for prediabetes.    Nani Gasser, MD

## 2023-08-08 DIAGNOSIS — I87391 Chronic venous hypertension (idiopathic) with other complications of right lower extremity: Secondary | ICD-10-CM | POA: Diagnosis not present

## 2023-08-08 DIAGNOSIS — I83891 Varicose veins of right lower extremities with other complications: Secondary | ICD-10-CM | POA: Diagnosis not present

## 2023-08-09 ENCOUNTER — Other Ambulatory Visit: Payer: Self-pay | Admitting: Family Medicine

## 2023-08-09 DIAGNOSIS — I890 Lymphedema, not elsewhere classified: Secondary | ICD-10-CM

## 2023-08-09 NOTE — Progress Notes (Signed)
Orders Placed This Encounter  Procedures  . Ambulatory referral to Physical Therapy    Referral Priority:   Routine    Referral Type:   Physical Medicine    Referral Reason:   Specialty Services Required    Requested Specialty:   Physical Therapy    Number of Visits Requested:   1

## 2023-08-10 LAB — VITAMIN B6: Vitamin B6: 9.1 ug/L (ref 3.4–65.2)

## 2023-08-10 LAB — VITAMIN B1: Thiamine: 95.4 nmol/L (ref 66.5–200.0)

## 2023-08-10 LAB — VITAMIN B12: Vitamin B-12: 886 pg/mL (ref 232–1245)

## 2023-08-11 ENCOUNTER — Telehealth: Payer: Self-pay

## 2023-08-11 DIAGNOSIS — I872 Venous insufficiency (chronic) (peripheral): Secondary | ICD-10-CM

## 2023-08-11 NOTE — Telephone Encounter (Signed)
Ok to place referral.

## 2023-08-11 NOTE — Progress Notes (Signed)
Hi Katherine Nixon, your B12 looks fantastic.  Vitamin B1 looks fantastic as well B6 is normal.

## 2023-08-11 NOTE — Telephone Encounter (Signed)
Referral placed.

## 2023-08-11 NOTE — Telephone Encounter (Signed)
Katherine Nixon states she cannot go to the physical therapy for Chronic venous insufficiency in [place] because they only see cancer patients. She states with the swelling she is unable to climb the stairs.   [facility] offers the Flexitouch plus.   [facility]  Phone [phone] Fax [phone]  You just have to send a referral. They will go out to the patient's home.

## 2023-09-06 DIAGNOSIS — I83891 Varicose veins of right lower extremities with other complications: Secondary | ICD-10-CM | POA: Diagnosis not present

## 2023-09-06 DIAGNOSIS — I83811 Varicose veins of right lower extremities with pain: Secondary | ICD-10-CM | POA: Diagnosis not present

## 2023-09-07 ENCOUNTER — Other Ambulatory Visit: Payer: Self-pay | Admitting: Family Medicine

## 2023-09-07 DIAGNOSIS — I890 Lymphedema, not elsewhere classified: Secondary | ICD-10-CM

## 2023-09-07 NOTE — Progress Notes (Signed)
I have already ordered this as a future order in the computer.

## 2023-09-11 ENCOUNTER — Telehealth: Payer: Self-pay | Admitting: Family Medicine

## 2023-09-11 NOTE — Telephone Encounter (Signed)
Referral, clinical notes, demographics, and copies of insurance cards have been faxed to [facility] at [phone]. Office will contact patient to schedule referral appointment.

## 2023-09-11 NOTE — Telephone Encounter (Signed)
[facility] called in stating that they do not handle lymphedema only the [place] or [facility] office

## 2023-09-14 ENCOUNTER — Emergency Department (HOSPITAL_COMMUNITY): Payer: Medicare Other

## 2023-09-14 ENCOUNTER — Emergency Department (HOSPITAL_COMMUNITY)
Admission: EM | Admit: 2023-09-14 | Discharge: 2023-09-14 | Disposition: A | Discharge status: Home / Self Care | Payer: Medicare Other | Attending: Emergency Medicine | Admitting: Emergency Medicine

## 2023-09-14 ENCOUNTER — Encounter (HOSPITAL_COMMUNITY): Payer: Self-pay | Admitting: Emergency Medicine

## 2023-09-14 ENCOUNTER — Other Ambulatory Visit: Payer: Self-pay

## 2023-09-14 DIAGNOSIS — S3993XA Unspecified injury of pelvis, initial encounter: Secondary | ICD-10-CM | POA: Diagnosis not present

## 2023-09-14 DIAGNOSIS — R109 Unspecified abdominal pain: Secondary | ICD-10-CM | POA: Insufficient documentation

## 2023-09-14 DIAGNOSIS — R0789 Other chest pain: Secondary | ICD-10-CM | POA: Insufficient documentation

## 2023-09-14 DIAGNOSIS — S2242XA Multiple fractures of ribs, left side, initial encounter for closed fracture: Secondary | ICD-10-CM | POA: Diagnosis not present

## 2023-09-14 DIAGNOSIS — Z79899 Other long term (current) drug therapy: Secondary | ICD-10-CM | POA: Diagnosis not present

## 2023-09-14 DIAGNOSIS — W01190A Fall on same level from slipping, tripping and stumbling with subsequent striking against furniture, initial encounter: Secondary | ICD-10-CM | POA: Diagnosis not present

## 2023-09-14 DIAGNOSIS — R42 Dizziness and giddiness: Secondary | ICD-10-CM | POA: Diagnosis not present

## 2023-09-14 DIAGNOSIS — I700 Atherosclerosis of aorta: Secondary | ICD-10-CM | POA: Diagnosis not present

## 2023-09-14 DIAGNOSIS — S299XXA Unspecified injury of thorax, initial encounter: Secondary | ICD-10-CM | POA: Diagnosis not present

## 2023-09-14 DIAGNOSIS — Z85828 Personal history of other malignant neoplasm of skin: Secondary | ICD-10-CM | POA: Insufficient documentation

## 2023-09-14 DIAGNOSIS — S3991XA Unspecified injury of abdomen, initial encounter: Secondary | ICD-10-CM | POA: Diagnosis not present

## 2023-09-14 DIAGNOSIS — I10 Essential (primary) hypertension: Secondary | ICD-10-CM | POA: Insufficient documentation

## 2023-09-14 DIAGNOSIS — W19XXXA Unspecified fall, initial encounter: Secondary | ICD-10-CM

## 2023-09-14 LAB — CBC WITH DIFFERENTIAL/PLATELET
Abs Immature Granulocytes: 0.03 10*3/uL (ref 0.00–0.07)
Basophils Absolute: 0 10*3/uL (ref 0.0–0.1)
Basophils Relative: 1 %
Eosinophils Absolute: 0.2 10*3/uL (ref 0.0–0.5)
Eosinophils Relative: 4 %
HCT: 38.4 % (ref 36.0–46.0)
Hemoglobin: 12 g/dL (ref 12.0–15.0)
Immature Granulocytes: 0 %
Lymphocytes Relative: 22 %
Lymphs Abs: 1.5 10*3/uL (ref 0.7–4.0)
MCH: 31.6 pg (ref 26.0–34.0)
MCHC: 31.3 g/dL (ref 30.0–36.0)
MCV: 101.1 fL — ABNORMAL HIGH (ref 80.0–100.0)
Monocytes Absolute: 0.7 10*3/uL (ref 0.1–1.0)
Monocytes Relative: 11 %
Neutro Abs: 4.3 10*3/uL (ref 1.7–7.7)
Neutrophils Relative %: 62 %
Platelets: 218 10*3/uL (ref 150–400)
RBC: 3.8 MIL/uL — ABNORMAL LOW (ref 3.87–5.11)
RDW: 15.8 % — ABNORMAL HIGH (ref 11.5–15.5)
WBC: 6.9 10*3/uL (ref 4.0–10.5)
nRBC: 0 % (ref 0.0–0.2)

## 2023-09-14 LAB — BASIC METABOLIC PANEL
Anion gap: 8 (ref 5–15)
BUN: 28 mg/dL — ABNORMAL HIGH (ref 8–23)
CO2: 29 mmol/L (ref 22–32)
Calcium: 9.6 mg/dL (ref 8.9–10.3)
Chloride: 98 mmol/L (ref 98–111)
Creatinine, Ser: 1.11 mg/dL — ABNORMAL HIGH (ref 0.44–1.00)
GFR, Estimated: 52 mL/min — ABNORMAL LOW (ref >60)
Glucose, Bld: 155 mg/dL — ABNORMAL HIGH (ref 70–99)
Potassium: 3.4 mmol/L — ABNORMAL LOW (ref 3.5–5.1)
Sodium: 135 mmol/L (ref 135–145)

## 2023-09-14 MED ORDER — OXYCODONE HCL 5 MG PO TABS
10.0000 mg | ORAL_TABLET | Freq: Once | ORAL | Status: AC
  Administered 2023-09-14: 10 mg via ORAL
  Filled 2023-09-14: qty 2

## 2023-09-14 MED ORDER — KETOROLAC TROMETHAMINE 15 MG/ML IJ SOLN
15.0000 mg | Freq: Once | INTRAMUSCULAR | Status: AC
  Administered 2023-09-14: 15 mg via INTRAVENOUS
  Filled 2023-09-14: qty 1

## 2023-09-14 MED ORDER — OXYCODONE HCL 5 MG PO TABS: 5.0000 mg | ORAL_TABLET | Freq: Four times a day (QID) | ORAL | 0 refills | Status: AC | PRN

## 2023-09-14 MED ORDER — FENTANYL CITRATE PF 50 MCG/ML IJ SOSY: 25.0000 ug | PREFILLED_SYRINGE | INTRAMUSCULAR | Status: DC | PRN

## 2023-09-14 MED ORDER — FENTANYL CITRATE PF 50 MCG/ML IJ SOSY
25.0000 ug | PREFILLED_SYRINGE | Freq: Once | INTRAMUSCULAR | Status: DC
Start: 2023-09-14 — End: 2023-09-15

## 2023-09-14 MED ORDER — FENTANYL CITRATE PF 50 MCG/ML IJ SOSY
50.0000 ug | PREFILLED_SYRINGE | Freq: Once | INTRAMUSCULAR | Status: AC
Start: 2023-09-14 — End: 2023-09-14
  Administered 2023-09-14: 50 ug via INTRAVENOUS
  Filled 2023-09-14: qty 1

## 2023-09-14 MED ORDER — ONDANSETRON HCL 4 MG/2ML IJ SOLN
4.0000 mg | Freq: Once | INTRAMUSCULAR | Status: AC
  Administered 2023-09-14: 4 mg via INTRAVENOUS
  Filled 2023-09-14: qty 2

## 2023-09-14 MED ORDER — OXYCODONE HCL 5 MG PO TABS: 5.0000 mg | ORAL_TABLET | Freq: Four times a day (QID) | ORAL | 0 refills | Status: DC | PRN

## 2023-09-14 MED ORDER — IOHEXOL 300 MG/ML  SOLN
100.0000 mL | Freq: Once | INTRAMUSCULAR | Status: AC | PRN
Start: 2023-09-14 — End: 2023-09-14
  Administered 2023-09-14: 100 mL via INTRAVENOUS

## 2023-09-14 NOTE — ED Provider Notes (Signed)
[place] [facility] AT [facility] Provider Note   CSN: [id number] Arrival date & time: 09/14/23  1835     History  Chief Complaint  Patient presents with   Rudolpho Sevin Taigen Branyan is a 77 y.o. female.  77 year old female presents today for concern of left-sided chest wall pain and abdominal wall pain after a fall that occurred just prior to arrival.  She states she was picking up something and got dizzy and fell onto the edge of a wooden table.  Denies any head injury.  Not on anticoagulation.  She states she is certain that she did not hit her head because there was nothing that hit her head on.  She states it is normal for her to get dizzy and fall.  She states she falls frequently.  She states this is a chronic issue and has been previously worked up.  Denies any joint pain.  She states she took oxycodone about 30 minutes after the fall which did not provide her with any relief.  She states taking a deep breath is also painful.  The history is provided by the patient. No language interpreter was used.       Home Medications Prior to Admission medications   Medication Sig Start Date End Date Taking? Authorizing Provider  amLODipine (NORVASC) 5 MG tablet TAKE 1 TABLET DAILY 06/01/23   Agapito Games, MD  atorvastatin (LIPITOR) 20 MG tablet TAKE 1 TABLET DAILY (NEED LABS) 07/05/23   Agapito Games, MD  DULoxetine (CYMBALTA) 60 MG capsule TAKE 1 CAPSULE DAILY 03/13/23   Agapito Games, MD  hydrochlorothiazide (HYDRODIURIL) 12.5 MG tablet TAKE 2 TABLETS (25 MG TOTAL) DAILY 07/26/23   Agapito Games, MD  linaclotide (LINZESS) 72 MCG capsule Take 72 mcg by mouth daily as needed (constipation).    [provider]  metFORMIN (GLUCOPHAGE-XR) 500 MG 24 hr tablet Take 1 tablet (500 mg total) by mouth daily with breakfast. 07/05/23   Agapito Games, MD  Multiple Vitamin (MULTIVITAMIN WITH MINERALS) TABS tablet Take 1 tablet by  mouth daily.     [provider]  POTASSIUM PO Take 1 tablet by mouth daily.    [provider]  pregabalin (LYRICA) 75 MG capsule TAKE 1 CAPSULE AT BEDTIME 04/12/23   Monica Becton, MD  tiZANidine (ZANAFLEX) 2 MG tablet Take 1 tablet (2 mg total) by mouth every 6 (six) hours as needed for muscle spasms. 03/09/23 03/08/24  Porterfield, Amber, PA-C  traZODone (DESYREL) 100 MG tablet TAKE 1 TABLET AT BEDTIME 10/17/22   Agapito Games, MD      Allergies    Gabapentin, Tramadol, Tylenol [acetaminophen], Pravastatin, and Sulfamethoxazole-trimethoprim    Review of Systems   Review of Systems  Constitutional:  Negative for chills and fever.  Respiratory:  Negative for shortness of breath.   Cardiovascular:  Positive for chest pain. Negative for palpitations and leg swelling.  Gastrointestinal:  Positive for abdominal pain. Negative for nausea and vomiting.  Neurological:  Negative for syncope and light-headedness.  All other systems reviewed and are negative.   Physical Exam Updated Vital Signs BP (!) 155/76 (BP Location: Right Arm)   Pulse 83   Temp 98.1 F (36.7 C) (Oral)   Resp 16   Ht 5\' 5"  (1.651 m)   Wt 90.7 kg   SpO2 98%   BMI 33.28 kg/m  Physical Exam Vitals and nursing note reviewed.  Constitutional:  General: She is not in acute distress.    Appearance: Normal appearance. She is not ill-appearing.  HENT:     Head: Normocephalic and atraumatic.     Nose: Nose normal.  Eyes:     Conjunctiva/sclera: Conjunctivae normal.  Pulmonary:     Effort: Pulmonary effort is normal. No respiratory distress.     Breath sounds: Normal breath sounds. No wheezing.  Abdominal:     General: There is no distension.     Palpations: Abdomen is soft.     Tenderness: There is abdominal tenderness. There is no guarding or rebound.  Musculoskeletal:        General: No deformity. Normal range of motion.     Cervical back: Normal range of motion.      Comments: Cervical, thoracic, lumbar spine without tenderness palpation.  There is significant tenderness to palpation present over the left lateral chest wall, left-sided abdominal wall.  Mild tenderness palpation over the abdomen.  The range of motion of bilateral Kommer extremities.  All major joints without tenderness to palpation.  Skin:    Findings: No rash.  Neurological:     Mental Status: She is alert.     ED Results / Procedures / Treatments   Labs (all labs ordered are listed, but only abnormal results are displayed) Labs Reviewed  CBC WITH DIFFERENTIAL/PLATELET - Abnormal; Notable for the following components:      Result Value   RBC 3.80 (*)    MCV 101.1 (*)    RDW 15.8 (*)    All other components within normal limits  BASIC METABOLIC PANEL - Abnormal; Notable for the following components:   Potassium 3.4 (*)    Glucose, Bld 155 (*)    BUN 28 (*)    Creatinine, Ser 1.11 (*)    GFR, Estimated 52 (*)    All other components within normal limits    EKG None  Radiology No results found.  Procedures Procedures    Medications Ordered in ED Medications  fentaNYL (SUBLIMAZE) injection 50 mcg (50 mcg Intravenous Given 09/14/23 1948)  ondansetron (ZOFRAN) injection 4 mg (4 mg Intravenous Given 09/14/23 1947)    ED Course/ Medical Decision Making/ A&P                                 Medical Decision Making Amount and/or Complexity of Data Reviewed Labs: ordered. Radiology: ordered.  Risk Prescription drug management.   Medical Decision Making / ED Course   This patient presents to the ED for concern of fall, this involves an extensive number of treatment options, and is a complaint that carries with it a high risk of complications and morbidity.  The differential diagnosis includes rib fractures, other traumatic internal injury, head injury, spinal fracture  MDM: 77 year old female presents today for concern of a fall.  This occurred after she got dizzy  and fell.  She states dizziness after position change is normal for her.  She denies any head injury.  She struck her left side of her chest wall and abdominal wall against a wooden table.  Has significant pain to the left side of her chest wall and abdominal wall.  No other injuries.  Will obtain basic labs, and CT chest abdomen pelvis with contrast as well has had reconstructive films of the T and L-spine.  She is without any spinal tenderness palpation. She is certain that she did not have head trauma  and does not want CT imaging of her head.  Will give pain control and reevaluate.    She has been waiting imaging.  Signed out to Dr. Doran Durand to follow-up on imaging.  Lab Tests: -I ordered, reviewed, and interpreted labs.   The pertinent results include:   Labs Reviewed  CBC WITH DIFFERENTIAL/PLATELET - Abnormal; Notable for the following components:      Result Value   RBC 3.80 (*)    MCV 101.1 (*)    RDW 15.8 (*)    All other components within normal limits  BASIC METABOLIC PANEL - Abnormal; Notable for the following components:   Potassium 3.4 (*)    Glucose, Bld 155 (*)    BUN 28 (*)    Creatinine, Ser 1.11 (*)    GFR, Estimated 52 (*)    All other components within normal limits      EKG  EKG Interpretation Date/Time:    Ventricular Rate:    PR Interval:    QRS Duration:    QT Interval:    QTC Calculation:   R Axis:      Text Interpretation:           Imaging Studies ordered: I ordered imaging studies including CT chest abdomen pelvis with contrast, CT T-spine and L-spine with no charge I independently visualized and interpreted imaging. I agree with the radiologist interpretation   Medicines ordered and prescription drug management: Meds ordered this encounter  Medications   fentaNYL (SUBLIMAZE) injection 50 mcg   ondansetron (ZOFRAN) injection 4 mg   fentaNYL (SUBLIMAZE) injection 25 mcg   fentaNYL (SUBLIMAZE) injection 25 mcg    -I have reviewed  the patients home medicines and have made adjustments as needed   Reevaluation: After the interventions noted above, I reevaluated the patient and found that they have :stayed the same  Co morbidities that complicate the patient evaluation  Past Medical History:  Diagnosis Date   Anxiety    Basal cell carcinoma 01/2018   "upper lip"   Chronic diarrhea    Complication of anesthesia 1989   diff to wake up with gallbaldder surgery- no problems since (03/12/2018)   Connective tissue disorder (HCC)    "lupus"   Depression    Dysrhythmia     states at times, last occurence was january 2019 feels like heart fluttering , last a couple seconds, denies any associated cardiac symtpoms. rpeorts at her last annual check up her pcp [facility]  heard it on pysical  exam and ordred an ECHO , per patietn no one reached out to her to set it up    GERD (gastroesophageal reflux disease)    Heart murmur    High cholesterol    History of blood transfusion 1970   "related to childbirth"   Hypertension    IBS (irritable bowel syndrome)    Migraine    "none since ~ 1980" (03/12/2018)   Neuromuscular disorder (HCC)    lupus   Osteoarthritis    "all my joints" (03/12/2018)   Squamous cell carcinoma    "right arm"      Dispostion: Signout to Dr. Earlyne Iba to follow-up on imaging.  Final Clinical Impression(s) / ED Diagnoses Final diagnoses:  Fall, initial encounter    Rx / DC Orders ED Discharge Orders     None         Marita Kansas, PA-C 09/14/23 2129    Glyn Ade, MD 09/14/23 2246    Glyn Ade, MD 09/14/23 2307

## 2023-09-14 NOTE — Discharge Instructions (Addendum)
You were seen today for multiple rib fractures.  Your incentive spirometry is lower than it should be.  You need to continue doing incentive spirometry every 15 to 30 minutes and follow-up with your primary care doctor within 24 to 48 hours. Additionally we have offered admitting you for management of her condition but you need to take care of your family member.  If you feel unsafe at home you are welcome to return at anytime for reevaluation.

## 2023-09-14 NOTE — ED Triage Notes (Signed)
Patient arrives in wheelchair by POV states she tripped on a box in her garage and landed on her left side. States she took an oxy at home that is not helping her pain. Denies hitting head. No blood thinners.

## 2023-09-21 ENCOUNTER — Ambulatory Visit (INDEPENDENT_AMBULATORY_CARE_PROVIDER_SITE_OTHER): Payer: Medicare Other | Admitting: Family Medicine

## 2023-09-21 ENCOUNTER — Encounter: Payer: Self-pay | Admitting: Family Medicine

## 2023-09-21 VITALS — BP 131/59 | HR 67 | Ht 65.0 in | Wt 195.0 lb

## 2023-09-21 DIAGNOSIS — R2689 Other abnormalities of gait and mobility: Secondary | ICD-10-CM | POA: Diagnosis not present

## 2023-09-21 DIAGNOSIS — S2242XA Multiple fractures of ribs, left side, initial encounter for closed fracture: Secondary | ICD-10-CM | POA: Diagnosis not present

## 2023-09-21 DIAGNOSIS — M51369 Other intervertebral disc degeneration, lumbar region without mention of lumbar back pain or lower extremity pain: Secondary | ICD-10-CM

## 2023-09-21 MED ORDER — PREGABALIN 25 MG PO CAPS: 25.0000 mg | ORAL_CAPSULE | Freq: Every day | ORAL | 0 refills | Status: AC

## 2023-09-21 NOTE — Patient Instructions (Signed)
Decrease lyrica to 2 tabs of 25mg  for 5 days and then 1 a day for 5 days and the stop for 2 weeks.

## 2023-09-22 ENCOUNTER — Encounter: Payer: Self-pay | Admitting: Family Medicine

## 2023-09-22 NOTE — Assessment & Plan Note (Signed)
She has had formal physical therapy for gait in the past and did get some mild improvement.  We discussed that that could be repeated or continue to work on some of those exercises that she learned at home it really is important in helping reduce her risk I do understand that it will probably not at 100% improve the balance issues but again can help at least reduce falls.  In looking over her medication list she is on several medicines that can cause this.  She says she really does not take the muscle relaxer tizanidine some gena take that off of her med list.  We discussed that Lyrica can cause some balance and sedation issues so the only way to know would be to come completely off the medication for a couple of weeks to see if she notices any improvement.  I did discuss a taper with her and wrote that out for her sent over the 25 mg dose so that she could taper.  If she does not notice any improvement she can always restart it.  The other medications that could certainly be culprits could be her trazodone even though she has been on the same dose for years.  We also discussed monitoring blood pressure as a possibility as well and that even some blood pressure pills can cause lightheadedness and dizziness and so even potentially holding 1 of those at a time to see if that is contributing could be helpful as well.  Also a referral to neurology could be helpful.  We can discuss that further when she comes back in.

## 2023-10-09 ENCOUNTER — Other Ambulatory Visit: Payer: Self-pay | Admitting: Family Medicine

## 2023-10-09 DIAGNOSIS — G4700 Insomnia, unspecified: Secondary | ICD-10-CM

## 2023-12-14 ENCOUNTER — Other Ambulatory Visit: Payer: Self-pay | Admitting: Family Medicine

## 2023-12-14 DIAGNOSIS — R7303 Prediabetes: Secondary | ICD-10-CM

## 2024-01-01 ENCOUNTER — Other Ambulatory Visit: Payer: Self-pay | Admitting: Family Medicine

## 2024-03-06 ENCOUNTER — Other Ambulatory Visit: Payer: Self-pay | Admitting: Family Medicine

## 2024-07-23 ENCOUNTER — Encounter: Payer: Self-pay | Admitting: Sports Medicine

## 2024-10-03 ENCOUNTER — Other Ambulatory Visit: Payer: Self-pay | Admitting: Family Medicine

## 2024-10-03 DIAGNOSIS — G4700 Insomnia, unspecified: Secondary | ICD-10-CM
